# Patient Record
Sex: Male | Born: 1960 | Race: White | Hispanic: No | Marital: Single | Smoking: Former smoker
Health system: Southern US, Community
[De-identification: ages and names within clinical notes are randomized; demographics above are authoritative.]

## PROBLEM LIST (undated history)

## (undated) DIAGNOSIS — I82409 Acute embolism and thrombosis of unspecified deep veins of unspecified lower extremity: Secondary | ICD-10-CM

## (undated) DIAGNOSIS — E785 Hyperlipidemia, unspecified: Secondary | ICD-10-CM

## (undated) DIAGNOSIS — R011 Cardiac murmur, unspecified: Secondary | ICD-10-CM

## (undated) DIAGNOSIS — IMO0002 Reserved for concepts with insufficient information to code with codable children: Secondary | ICD-10-CM

## (undated) DIAGNOSIS — R202 Paresthesia of skin: Secondary | ICD-10-CM

## (undated) DIAGNOSIS — I10 Essential (primary) hypertension: Secondary | ICD-10-CM

## (undated) DIAGNOSIS — R319 Hematuria, unspecified: Secondary | ICD-10-CM

## (undated) DIAGNOSIS — R200 Anesthesia of skin: Secondary | ICD-10-CM

## (undated) DIAGNOSIS — C4492 Squamous cell carcinoma of skin, unspecified: Secondary | ICD-10-CM

## (undated) HISTORY — DX: Squamous cell carcinoma of skin, unspecified: C44.92

## (undated) HISTORY — DX: Reserved for concepts with insufficient information to code with codable children: IMO0002

## (undated) HISTORY — DX: Hematuria, unspecified: R31.9

## (undated) HISTORY — DX: Essential (primary) hypertension: I10

## (undated) HISTORY — DX: Cardiac murmur, unspecified: R01.1

## (undated) HISTORY — DX: Paresthesia of skin: R20.0

## (undated) HISTORY — PX: MOHS SURGERY: SHX181

## (undated) HISTORY — DX: Acute embolism and thrombosis of unspecified deep veins of unspecified lower extremity: I82.409

## (undated) HISTORY — DX: Hyperlipidemia, unspecified: E78.5

## (undated) HISTORY — DX: Paresthesia of skin: R20.2

---
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
## 2006-05-24 ENCOUNTER — Ambulatory Visit: Payer: Self-pay | Admitting: Internal Medicine

## 2006-06-01 ENCOUNTER — Ambulatory Visit: Payer: Self-pay

## 2006-06-01 ENCOUNTER — Encounter: Payer: Self-pay | Admitting: Internal Medicine

## 2006-06-01 ENCOUNTER — Encounter: Payer: Self-pay | Admitting: Cardiology

## 2006-11-17 ENCOUNTER — Encounter: Payer: Self-pay | Admitting: Internal Medicine

## 2006-11-17 DIAGNOSIS — IMO0002 Reserved for concepts with insufficient information to code with codable children: Secondary | ICD-10-CM | POA: Insufficient documentation

## 2006-11-18 ENCOUNTER — Ambulatory Visit: Payer: Self-pay | Admitting: Internal Medicine

## 2006-11-18 DIAGNOSIS — I359 Nonrheumatic aortic valve disorder, unspecified: Secondary | ICD-10-CM | POA: Insufficient documentation

## 2006-11-18 DIAGNOSIS — E785 Hyperlipidemia, unspecified: Secondary | ICD-10-CM | POA: Insufficient documentation

## 2006-11-18 DIAGNOSIS — I10 Essential (primary) hypertension: Secondary | ICD-10-CM | POA: Insufficient documentation

## 2006-11-18 DIAGNOSIS — J309 Allergic rhinitis, unspecified: Secondary | ICD-10-CM | POA: Insufficient documentation

## 2006-11-19 LAB — CONVERTED CEMR LAB
BUN: 11 mg/dL (ref 6–23)
CO2: 29 meq/L (ref 19–32)
Calcium: 9 mg/dL (ref 8.4–10.5)
Chloride: 105 meq/L (ref 96–112)
Cholesterol: 212 mg/dL (ref 0–200)
Creatinine, Ser: 0.8 mg/dL (ref 0.4–1.5)
Direct LDL: 139.9 mg/dL
GFR calc Af Amer: 134 mL/min
GFR calc non Af Amer: 111 mL/min
Glucose, Bld: 89 mg/dL (ref 70–99)
HDL: 35.5 mg/dL — ABNORMAL LOW (ref >39.0)
Potassium: 4.3 meq/L (ref 3.5–5.1)
Sodium: 139 meq/L (ref 135–145)
Total CHOL/HDL Ratio: 6
Triglycerides: 226 mg/dL (ref 0–149)
VLDL: 45 mg/dL — ABNORMAL HIGH (ref 0–40)

## 2007-03-02 ENCOUNTER — Telehealth: Payer: Self-pay | Admitting: Internal Medicine

## 2007-05-16 ENCOUNTER — Encounter: Payer: Self-pay | Admitting: Internal Medicine

## 2007-08-23 ENCOUNTER — Ambulatory Visit: Payer: Self-pay | Admitting: Internal Medicine

## 2007-08-23 DIAGNOSIS — G4730 Sleep apnea, unspecified: Secondary | ICD-10-CM

## 2007-08-23 DIAGNOSIS — G4733 Obstructive sleep apnea (adult) (pediatric): Secondary | ICD-10-CM | POA: Insufficient documentation

## 2007-08-25 ENCOUNTER — Telehealth: Payer: Self-pay | Admitting: Internal Medicine

## 2007-08-25 LAB — CONVERTED CEMR LAB
ALT: 40 units/L (ref 0–53)
AST: 24 units/L (ref 0–37)
Albumin: 3.3 g/dL — ABNORMAL LOW (ref 3.5–5.2)
Alkaline Phosphatase: 65 units/L (ref 39–117)
BUN: 12 mg/dL (ref 6–23)
Basophils Absolute: 0 10*3/uL (ref 0.0–0.1)
Basophils Relative: 0.7 % (ref 0.0–1.0)
Bilirubin, Direct: 0.1 mg/dL (ref 0.0–0.3)
CO2: 31 meq/L (ref 19–32)
Calcium: 9.4 mg/dL (ref 8.4–10.5)
Chloride: 105 meq/L (ref 96–112)
Cholesterol: 230 mg/dL (ref 0–200)
Creatinine, Ser: 0.9 mg/dL (ref 0.4–1.5)
Direct LDL: 171.9 mg/dL
Eosinophils Absolute: 0.3 10*3/uL (ref 0.0–0.7)
Eosinophils Relative: 3.6 % (ref 0.0–5.0)
GFR calc Af Amer: 116 mL/min
GFR calc non Af Amer: 96 mL/min
Glucose, Bld: 95 mg/dL (ref 70–99)
HCT: 42.3 % (ref 39.0–52.0)
HDL: 37.5 mg/dL — ABNORMAL LOW (ref >39.0)
Hemoglobin: 14.3 g/dL (ref 13.0–17.0)
Lymphocytes Relative: 24.6 % (ref 12.0–46.0)
MCHC: 33.7 g/dL (ref 30.0–36.0)
MCV: 98.1 fL (ref 78.0–100.0)
Monocytes Absolute: 0.6 10*3/uL (ref 0.1–1.0)
Monocytes Relative: 8.2 % (ref 3.0–12.0)
Neutro Abs: 4.5 10*3/uL (ref 1.4–7.7)
Neutrophils Relative %: 62.9 % (ref 43.0–77.0)
Platelets: 268 10*3/uL (ref 150–400)
Potassium: 4.5 meq/L (ref 3.5–5.1)
RBC: 4.32 M/uL (ref 4.22–5.81)
RDW: 12.5 % (ref 11.5–14.6)
Sodium: 140 meq/L (ref 135–145)
TSH: 1.42 microintl units/mL (ref 0.35–5.50)
Total Bilirubin: 0.6 mg/dL (ref 0.3–1.2)
Total CHOL/HDL Ratio: 6.1
Total Protein: 6.4 g/dL (ref 6.0–8.3)
Triglycerides: 129 mg/dL (ref 0–149)
VLDL: 26 mg/dL (ref 0–40)
WBC: 7.1 10*3/uL (ref 4.5–10.5)

## 2007-09-21 ENCOUNTER — Encounter: Payer: Self-pay | Admitting: Internal Medicine

## 2007-09-21 ENCOUNTER — Ambulatory Visit (HOSPITAL_BASED_OUTPATIENT_CLINIC_OR_DEPARTMENT_OTHER): Admission: RE | Admit: 2007-09-21 | Discharge: 2007-09-21 | Payer: Self-pay | Admitting: Internal Medicine

## 2007-09-22 ENCOUNTER — Ambulatory Visit: Payer: Self-pay | Admitting: Pulmonary Disease

## 2007-09-22 ENCOUNTER — Telehealth (INDEPENDENT_AMBULATORY_CARE_PROVIDER_SITE_OTHER): Payer: Self-pay | Admitting: *Deleted

## 2008-03-21 ENCOUNTER — Telehealth: Payer: Self-pay | Admitting: Internal Medicine

## 2008-03-28 ENCOUNTER — Ambulatory Visit: Payer: Self-pay | Admitting: Internal Medicine

## 2008-03-28 DIAGNOSIS — E669 Obesity, unspecified: Secondary | ICD-10-CM

## 2008-03-28 DIAGNOSIS — M109 Gout, unspecified: Secondary | ICD-10-CM | POA: Insufficient documentation

## 2008-11-28 ENCOUNTER — Encounter: Payer: Self-pay | Admitting: Internal Medicine

## 2008-12-17 ENCOUNTER — Ambulatory Visit (HOSPITAL_COMMUNITY): Admission: RE | Admit: 2008-12-17 | Discharge: 2008-12-17 | Payer: Self-pay | Admitting: Internal Medicine

## 2008-12-18 ENCOUNTER — Ambulatory Visit: Payer: Self-pay | Admitting: Internal Medicine

## 2008-12-18 LAB — CONVERTED CEMR LAB
ALT: 36 units/L (ref 0–53)
AST: 21 units/L (ref 0–37)
Albumin: 3.4 g/dL — ABNORMAL LOW (ref 3.5–5.2)
Alkaline Phosphatase: 65 units/L (ref 39–117)
BUN: 14 mg/dL (ref 6–23)
Basophils Absolute: 0 10*3/uL (ref 0.0–0.1)
Basophils Relative: 0.2 % (ref 0.0–3.0)
Bilirubin Urine: NEGATIVE
Bilirubin, Direct: 0.1 mg/dL (ref 0.0–0.3)
CO2: 27 meq/L (ref 19–32)
Calcium: 9.1 mg/dL (ref 8.4–10.5)
Chloride: 108 meq/L (ref 96–112)
Cholesterol: 214 mg/dL — ABNORMAL HIGH (ref 0–200)
Creatinine, Ser: 0.8 mg/dL (ref 0.4–1.5)
Direct LDL: 155.1 mg/dL
Eosinophils Absolute: 0.2 10*3/uL (ref 0.0–0.7)
Eosinophils Relative: 2.9 % (ref 0.0–5.0)
GFR calc non Af Amer: 109.49 mL/min (ref >60)
Glucose, Bld: 97 mg/dL (ref 70–99)
Glucose, Urine, Semiquant: NEGATIVE
HCT: 42.5 % (ref 39.0–52.0)
HDL: 38.4 mg/dL — ABNORMAL LOW (ref >39.00)
Hemoglobin: 15.2 g/dL (ref 13.0–17.0)
Hgb A1c MFr Bld: 5.6 % (ref 4.6–6.5)
Ketones, urine, test strip: NEGATIVE
Lymphocytes Relative: 17.8 % (ref 12.0–46.0)
Lymphs Abs: 1.4 10*3/uL (ref 0.7–4.0)
MCHC: 35.7 g/dL (ref 30.0–36.0)
MCV: 94.8 fL (ref 78.0–100.0)
Monocytes Absolute: 0.7 10*3/uL (ref 0.1–1.0)
Monocytes Relative: 8.6 % (ref 3.0–12.0)
Neutro Abs: 5.6 10*3/uL (ref 1.4–7.7)
Neutrophils Relative %: 70.5 % (ref 43.0–77.0)
Nitrite: NEGATIVE
Platelets: 250 10*3/uL (ref 150.0–400.0)
Potassium: 4.1 meq/L (ref 3.5–5.1)
RBC: 4.48 M/uL (ref 4.22–5.81)
RDW: 12.5 % (ref 11.5–14.6)
Sodium: 140 meq/L (ref 135–145)
Specific Gravity, Urine: >=1.03
TSH: 0.98 microintl units/mL (ref 0.35–5.50)
Total Bilirubin: 0.7 mg/dL (ref 0.3–1.2)
Total CHOL/HDL Ratio: 6
Total Protein: 6.5 g/dL (ref 6.0–8.3)
Triglycerides: 113 mg/dL (ref 0.0–149.0)
Urobilinogen, UA: 0.2
VLDL: 22.6 mg/dL (ref 0.0–40.0)
WBC Urine, dipstick: NEGATIVE
WBC: 7.9 10*3/uL (ref 4.5–10.5)
pH: 5.5

## 2008-12-19 ENCOUNTER — Telehealth: Payer: Self-pay | Admitting: Internal Medicine

## 2008-12-19 ENCOUNTER — Encounter: Payer: Self-pay | Admitting: Internal Medicine

## 2008-12-26 ENCOUNTER — Ambulatory Visit: Payer: Self-pay | Admitting: Internal Medicine

## 2008-12-27 ENCOUNTER — Ambulatory Visit: Payer: Self-pay

## 2008-12-27 ENCOUNTER — Encounter: Payer: Self-pay | Admitting: Internal Medicine

## 2009-01-21 ENCOUNTER — Telehealth: Payer: Self-pay | Admitting: Internal Medicine

## 2009-01-22 ENCOUNTER — Encounter: Payer: Self-pay | Admitting: Internal Medicine

## 2009-02-05 ENCOUNTER — Telehealth (INDEPENDENT_AMBULATORY_CARE_PROVIDER_SITE_OTHER): Payer: Self-pay | Admitting: *Deleted

## 2009-03-20 HISTORY — PX: LAPAROSCOPIC GASTRIC BANDING: SHX1100

## 2009-03-25 ENCOUNTER — Telehealth: Payer: Self-pay | Admitting: Internal Medicine

## 2009-05-24 ENCOUNTER — Telehealth: Payer: Self-pay | Admitting: Internal Medicine

## 2010-01-13 ENCOUNTER — Ambulatory Visit: Payer: Self-pay | Admitting: Internal Medicine

## 2010-01-13 DIAGNOSIS — F17200 Nicotine dependence, unspecified, uncomplicated: Secondary | ICD-10-CM | POA: Insufficient documentation

## 2010-01-16 LAB — CONVERTED CEMR LAB
ALT: 34 units/L (ref 0–53)
AST: 32 units/L (ref 0–37)
Albumin: 3.3 g/dL — ABNORMAL LOW (ref 3.5–5.2)
Alkaline Phosphatase: 66 units/L (ref 39–117)
BUN: 9 mg/dL (ref 6–23)
Basophils Absolute: 0 10*3/uL (ref 0.0–0.1)
Basophils Relative: 0.4 % (ref 0.0–3.0)
Bilirubin, Direct: 0.2 mg/dL (ref 0.0–0.3)
CO2: 28 meq/L (ref 19–32)
Calcium: 9.1 mg/dL (ref 8.4–10.5)
Chloride: 107 meq/L (ref 96–112)
Cholesterol: 199 mg/dL (ref 0–200)
Creatinine, Ser: 0.6 mg/dL (ref 0.4–1.5)
Eosinophils Absolute: 0.2 10*3/uL (ref 0.0–0.7)
Eosinophils Relative: 3 % (ref 0.0–5.0)
GFR calc non Af Amer: 157.99 mL/min (ref >60)
Glucose, Bld: 77 mg/dL (ref 70–99)
HCT: 42.1 % (ref 39.0–52.0)
HDL: 53.3 mg/dL (ref >39.00)
Hemoglobin: 14.3 g/dL (ref 13.0–17.0)
LDL Cholesterol: 129 mg/dL — ABNORMAL HIGH (ref 0–99)
Lymphocytes Relative: 23.8 % (ref 12.0–46.0)
Lymphs Abs: 1.9 10*3/uL (ref 0.7–4.0)
MCHC: 34 g/dL (ref 30.0–36.0)
MCV: 100.5 fL — ABNORMAL HIGH (ref 78.0–100.0)
Monocytes Absolute: 0.7 10*3/uL (ref 0.1–1.0)
Monocytes Relative: 8.6 % (ref 3.0–12.0)
Neutro Abs: 5.1 10*3/uL (ref 1.4–7.7)
Neutrophils Relative %: 64.2 % (ref 43.0–77.0)
Platelets: 264 10*3/uL (ref 150.0–400.0)
Potassium: 4.9 meq/L (ref 3.5–5.1)
RBC: 4.19 M/uL — ABNORMAL LOW (ref 4.22–5.81)
RDW: 14.1 % (ref 11.5–14.6)
Sodium: 142 meq/L (ref 135–145)
TSH: 1.05 microintl units/mL (ref 0.35–5.50)
Total Bilirubin: 0.9 mg/dL (ref 0.3–1.2)
Total CHOL/HDL Ratio: 4
Total Protein: 5.9 g/dL — ABNORMAL LOW (ref 6.0–8.3)
Triglycerides: 85 mg/dL (ref 0.0–149.0)
VLDL: 17 mg/dL (ref 0.0–40.0)
WBC: 7.9 10*3/uL (ref 4.5–10.5)

## 2010-05-11 ENCOUNTER — Encounter: Payer: Self-pay | Admitting: Internal Medicine

## 2010-05-20 NOTE — Assessment & Plan Note (Signed)
Summary: 6 month fu bp and aortic stenosis/et rsc bmp/njr   Vital Signs:  Patient Profile:   50 Years Old Male Height:     72 inches (182.88 cm) Temp:     98.2 degrees F oral Pulse rate:   86 / minute Pulse rhythm:   regular Resp:     16 per minute BP sitting:   168 / 90  Pt. in pain?   no                Chief Complaint:  BP check.  History of Present Illness: here for f/u htn---no sxs on meds Follow-Up Visit      This is a 50 year old man who presents for Follow-up visit.  The patient denies chest pain, palpitations, dizziness, syncope, low blood sugar symptoms, high blood sugar symptoms, edema, SOB, DOE, PND, and orthopnea.  Since the last visit the patient notes no new problems or concerns.  The patient reports taking meds as prescribed and not monitoring BP.  When questioned about possible medication side effects, the patient notes none.    Current Allergies: No known allergies   Past Medical History:    Reviewed history from 11/17/2006 and no changes required:       CARCINOMA, SKIN, SQUAMOUS CELL, FACE (ICD-173.3)       DEGENERATIVE DISC DISEASE (ICD-722.6)       Hyperlipidemia       Hypertension  Past Surgical History:    Reviewed history from 11/17/2006 and no changes required:       Hernia repair x2   Family History:    Reviewed history and no changes required:       mother-breast ca       father htn  Social History:    Reviewed history and no changes required:       Occupation: [employer]---works 20 days monthly [place]       Divorced       Current Smoker       Alcohol use-yes   Risk Factors:  Tobacco use:  current Alcohol use:  yes   Review of Systems       no othe rcomplaints in a complete ROS   Physical Exam  General:     Well-developed,well-nourished,in no acute distress; alert,appropriate and cooperative throughout examination Eyes:     No corneal or conjunctival inflammation noted. EOMI. Perrla. Funduscopic exam benign, without  hemorrhages, exudates or papilledema. Vision grossly normal. Mouth:     Oral mucosa and oropharynx without lesions or exudates.  Teeth in good repair. Neck:     No deformities, masses, or tenderness noted. Chest Wall:     No deformities, masses, tenderness or gynecomastia noted. Lungs:     Normal respiratory effort, chest expands symmetrically. Lungs are clear to auscultation, no crackles or wheezes. Heart:     Normal rate and regular rhythm. S1 and S2 normal without gallop, murmur, click, rub or other extra sounds. 1-2/6 murmur sem Abdomen:     Bowel sounds positive,abdomen soft and non-tender without masses, organomegaly or hernias noted. Neurologic:     No cranial nerve deficits noted. Station and gait are normal. DTRs are symmetrical throughout. Sensory, motor and coordinative functions appear intact.    Impression & Recommendations:  Problem # 1:  HYPERTENSION (ICD-401.9)  His updated medication list for this problem includes:    Lisinopril-hydrochlorothiazide 20-12.5 Mg Tabs (Lisinopril-hydrochlorothiazide) ..... Once daily  Bp not adequately controlled---add hctz--see orders BMET  Orders: Venipuncture (57846)  TLB-BMP (Basic Metabolic Panel-BMET) (80048-METABOL)   Problem # 2:  HYPERLIPIDEMIA (ICD-272.4) check today Orders: Venipuncture (44010) TLB-Lipid Panel (80061-LIPID) may need treatment   Problem # 3:  AORTIC STENOSIS (ICD-424.1) reviewed echo, no further eval at this time murmur sounds better at this visit  Problem # 4:  ALLERGIC  RHINITIS (ICD-477.9)  His updated medication list for this problem includes:    Alavert 10 Mg Tbdp (Loratadine) ..... One by mouth daily  Orders: Venipuncture (27253)   Complete Medication List: 1)  Lisinopril-hydrochlorothiazide 20-12.5 Mg Tabs (Lisinopril-hydrochlorothiazide) .... Once daily 2)  Alavert 10 Mg Tbdp (Loratadine) .... One by mouth daily   Patient Instructions: 1)  see me 6  months    Prescriptions: ALAVERT 10 MG  TBDP (LORATADINE) one by mouth daily  #100 x 3   Entered and Authorized by:   Birdie Sons MD   Signed by:   Birdie Sons MD on 11/18/2006   Method used:   Electronically sent to ...       [facility] [address]       [address]       [place], [place]  [address]       Ph: [phone]       Fax: [phone]   RxID:   [id number] LISINOPRIL-HYDROCHLOROTHIAZIDE 20-12.5 MG TABS (LISINOPRIL-HYDROCHLOROTHIAZIDE) once daily  #100 x 3   Entered and Authorized by:   Birdie Sons MD   Signed by:   Birdie Sons MD on 11/18/2006   Method used:   Electronically sent to ...       [facility] [address]       [address]       [place], [place]  [address]       Ph: [phone]       Fax: [phone]   RxID:   [id number]

## 2010-05-20 NOTE — Assessment & Plan Note (Signed)
Summary: cpx--will fast//ccm   Vital Signs:  Patient profile:   50 year old male Height:      70 inches Weight:      229 pounds BMI:     32.98 Temp:     97.7 degrees F oral Pulse rate:   68 / minute Pulse rhythm:   regular Resp:     16 per minute BP sitting:   122 / 80  Vitals Entered By: Lynann Beaver CMA (January 13, 2010 8:22 AM) CC: cpx Is Patient Diabetic? No Pain Assessment Patient in pain? no        CC:  cpx.  History of Present Illness: cpx  hx of cystic acne---seems to be worsening (followed by dermatology)  Current Problems (verified): 1)  Obesity  (ICD-278.00) 2)  Gout  (ICD-274.9) 3)  Sleep Apnea  (ICD-780.57) 4)  Preventive Health Care  (ICD-V70.0) 5)  Allergic Rhinitis  (ICD-477.9) 6)  Hypertension  (ICD-401.9) 7)  Hyperlipidemia  (ICD-272.4) 8)  Aortic Stenosis  (ICD-424.1) 9)  Degenerative Disc Disease  (ICD-722.6)  Allergies: No Known Drug Allergies  Past History:  Past Medical History: Last updated: 03/28/2008 CARCINOMA, SKIN, SQUAMOUS CELL, FACE (ICD-173.3) DEGENERATIVE DISC DISEASE (ICD-722.6) Hyperlipidemia Hypertension Gout  Family History: Last updated: 11/18/2006 mother-breast ca father htn  Social History: Last updated: 11/18/2006 Occupation: [employer]---works 20 days monthly [place] Divorced Current Smoker Alcohol use-yes  Risk Factors: Smoking Status: current (11/18/2006) Packs/Day: 1-2 ppd (11/17/2006)  Past Surgical History: Hernia repair x2 Lap Band   Impression & Recommendations:  Problem # 1:  PREVENTIVE HEALTH CARE (ICD-V70.0)  health maint UTD congratulatedon weight loss---encouraged to continue  Orders: UA Dipstick w/o Micro (automated)  (81003) Venipuncture (16109) TLB-Lipid Panel (80061-LIPID) TLB-BMP (Basic Metabolic Panel-BMET) (80048-METABOL) TLB-CBC Platelet - w/Differential (85025-CBCD) TLB-Hepatic/Liver Function Pnl (80076-HEPATIC) TLB-TSH (Thyroid Stimulating Hormone)  (84443-TSH)  Problem # 2:  TOBACCO USE (ICD-305.1)  Encouraged smoking cessation and discussed different methods for smoking cessation.   Problem # 3:  HYPERTENSION (ICD-401.9) as he loses weight I suspect he'll get of BP meds.  His updated medication list for this problem includes:    Lisinopril 20 Mg Tabs (Lisinopril) .Marland Kitchen... 1/2 by mouth daily  BP today: 122/80 Prior BP: 130/86 (12/26/2008)  Labs Reviewed: K+: 4.1 (12/18/2008) Creat: : 0.8 (12/18/2008)   Chol: 214 (12/18/2008)   HDL: 38.40 (12/18/2008)   LDL: DEL (08/23/2007)   TG: 113.0 (12/18/2008)  Problem # 4:  HYPERLIPIDEMIA (ICD-272.4) check labs today---i suspect will be much better with weight loss Labs Reviewed: SGOT: 21 (12/18/2008)   SGPT: 36 (12/18/2008)   HDL:38.40 (12/18/2008), 37.5 (08/23/2007)  LDL:DEL (08/23/2007), DEL (11/18/2006)  Chol:214 (12/18/2008), 230 (08/23/2007)  Trig:113.0 (12/18/2008), 129 (08/23/2007)  Problem # 5:  OBESITY (ICD-278.00) much imporved  Problem # 6:  SLEEP APNEA (ICD-780.57) i suspect beeter or "cured" with weight loss.   Complete Medication List: 1)  Lisinopril 20 Mg Tabs (Lisinopril) .... 1/2 by mouth daily 2)  Loratidine Otc 10mg   .... Take 1 tablet by mouth once a day Physical Exam General Appearance: well developed, well nourished, no acute distress Eyes: conjunctiva and lids normal, PERRL, EOMI,  Ears, Nose, Mouth, Throat: TM clear, nares clear, oral exam WNL Neck: supple, no lymphadenopathy, no thyromegaly, no JVD Respiratory: clear to auscultation and percussion, respiratory effort normal Cardiovascular: regular rate and rhythm, S1-S2, no murmur, rub or gallop, no bruits, peripheral pulses normal and symmetric, no cyanosis, clubbing, edema or varicosities Chest: no scars, masses, tenderness; no asymmetry, skin  changes,  Gastrointestinal: soft, non-tender; no hepatosplenomegaly, masses; active bowel sounds all quadrants,  no masses, tenderness, hemorrhoids ---palpable Lap  Band epigastric area Genitourinary: no hernia,  Lymphatic: no cervical, axillary or inguinal adenopathy Musculoskeletal: gait normal, muscle tone and strength WNL, no joint swelling, effusions, discoloration, crepitus  Skin: clear, good turgor, color WNL, no rashes, lesions, or ulcerations Neurologic: normal mental status, normal reflexes, normal strength, sensation, and motion Psychiatric: alert; oriented to person, place and time Other Exam:     Appended Document: Orders Update    Clinical Lists Changes  Orders: Added new Service order of Specimen Handling (06301) - Signed Observations: Added new observation of COMMENTS: .sign (01/13/2010 8:54) Added new observation of PH URINE: 5.5  (01/13/2010 8:54) Added new observation of SPEC GR URIN: 1.020  (01/13/2010 8:54) Added new observation of APPEARANCE U: Clear  (01/13/2010 8:54) Added new observation of UA COLOR: yellow  (01/13/2010 8:54) Added new observation of WBC DIPSTK U: negative  (01/13/2010 8:54) Added new observation of NITRITE URN: negative  (01/13/2010 8:54) Added new observation of UROBILINOGEN: 0.2  (01/13/2010 8:54) Added new observation of PROTEIN, URN: 2+  (01/13/2010 8:54) Added new observation of BLOOD UR DIP: 3+  (01/13/2010 8:54) Added new observation of KETONES URN: negative  (01/13/2010 8:54) Added new observation of BILIRUBIN UR: negative  (01/13/2010 8:54) Added new observation of GLUCOSE, URN: negative  (01/13/2010 8:54)      Laboratory Results   Urine Tests    Routine Urinalysis   Color: yellow Appearance: Clear Glucose: negative   (Normal Range: Negative) Bilirubin: negative   (Normal Range: Negative) Ketone: negative   (Normal Range: Negative) Spec. Gravity: 1.020   (Normal Range: 1.003-1.035) Blood: 3+   (Normal Range: Negative) pH: 5.5   (Normal Range: 5.0-8.0) Protein: 2+   (Normal Range: Negative) Urobilinogen: 0.2   (Normal Range: 0-1) Nitrite: negative   (Normal Range:  Negative) Leukocyte Esterace: negative   (Normal Range: Negative)    Comments: .sign

## 2010-05-20 NOTE — Progress Notes (Signed)
Summary: rf-lisinopril-HCTZ  Phone Note Call from Patient Call back at [phone]   Caller: Patient Call For: dr Ziara Thelander Reason for Call: Refill Medication Summary of Call: rf bp [facility]s [facility].  call [phone]. id- [id number]. call pt when done.    pt other phone is [phone] 05/29/8597. Initial call taken by: Warnell Forester,  March 02, 2007 2:12 PM  Follow-up for Phone Call        Rx printed.  Will call pt back when rx signed...................................................................Marland KitchenGladis Riffle, RN  March 02, 2007 5:29 PM   Additional Follow-up for Phone Call Additional follow up Details #1::        Rx at front desk for pick up for pt to mail to [facility].Patient notified-message left on home machine. Additional Follow-up by: Gladis Riffle, RN,  March 04, 2007 1:53 PM      Prescriptions: LISINOPRIL-HYDROCHLOROTHIAZIDE 20-12.5 MG TABS (LISINOPRIL-HYDROCHLOROTHIAZIDE) once daily  #100 x 3   Entered by:   Gladis Riffle, RN   Authorized by:   Birdie Sons MD   Signed by:   Gladis Riffle, RN on 03/02/2007   Method used:   Print then Give to Patient   RxID:   [id number]     Appended Document: rf-lisinopril-HCTZ Patient called back.  Is out of [place] and will return Thurs. and will need med and requests that RX be called in this time.  Done.

## 2010-05-20 NOTE — Progress Notes (Signed)
Summary: Pt leaving for [place] Sat, requesting sleep machine ASAP  Phone Note Call from Patient Call back at Home Phone [phone]   Caller: vm triage Call For: swords Complaint: Chest Pain Summary of Call: went for a sleep study and they said he needs to get a machine.  He would like to expedite this as he is leaving the country for 4 weeks  Initial call taken by: Roselle Locus,  September 22, 2007 12:31 PM  Follow-up for Phone Call        PT CAME TO THE OFFICE ABOUT THE C PAP MACHINE  WOULD LIKE TO KNOW IF THE MACHINE CAN BE SHIPPED TO [place] OR [place] OR IF HE HAS TO COME BACK TO [place] TO PICK UP HE WILL CALL AND GIVE A GOOD 800# AS HE IS LEAVING IN THE AM FOR [place]  Roselle Locus  September 22, 2007 1:59 PM  Additional Follow-up for Phone Call Additional follow up Details #1::        Pt called with updated contact number in [place] entered new number into registration information, work number.  Pt is leaving tomorrow, wondering if he can still get his machine today before leaving.  Pt will be gone 4 weeks  Has order for machine been done? He would like to take machine with him if possible Additional Follow-up by: Sid Falcon LPN,  September 23, 979 12:08 PM    Additional Follow-up for Phone Call Additional follow up Details #2::    Per verbal from Dr Cato Mulligan after reviewing the sleep study, CPAP 10 cm H2O.  Please see how quickly Patsy Lager can get this dome for pt leaving out of town 4 weeks tomorow. Follow-up by: Sid Falcon LPN,  September 22, 1912 1:23 PM  Additional Follow-up for Phone Call Additional follow up Details #3:: Details for Additional Follow-up Action Taken: Faxed order, sleep study, insurance card  to [facility] [phone] Spoke with [facility]-patient can go by their office before 4:00pm today and get set up the CPAP machine Patient aware-given address and phone number  Additional Follow-up by: Florentina Addison,  September 23, 2007 2:23 PM

## 2010-05-20 NOTE — Letter (Signed)
Summary: sleep study  sleep study   Imported By: Kassie Mends 09/23/2007 16:07:30  _____________________________________________________________________  External Attachment:    Type:   Image     Comment:   sleep study

## 2010-05-20 NOTE — Progress Notes (Signed)
  Faxed patients lab results on 12-19-08 to Dr. Josiah Lobo at fax # [phone]. Rita Ohara

## 2010-05-20 NOTE — Progress Notes (Signed)
Summary: refill lisinopril-hctz  Phone Note Call from Patient Call back at Home Phone [phone]   Caller: pt live Call For: Matthew Savage Summary of Call: lisinopril hctz tab 20 mg - 12.5 [facility]  Initial call taken by: Roselle Locus,  March 21, 2008 8:40 AM  Follow-up for Phone Call        Rx faxed to [facility].Gladis Riffle, RN  March 21, 2008 9:12 AM   Patient notified.  Follow-up by: Gladis Riffle, RN,  March 21, 2008 10:52 AM      Prescriptions: LISINOPRIL-HYDROCHLOROTHIAZIDE 20-12.5 MG TABS (LISINOPRIL-HYDROCHLOROTHIAZIDE) once daily  #100 x 3   Entered by:   Gladis Riffle, RN   Authorized by:   Birdie Sons MD   Signed by:   Gladis Riffle, RN on 03/21/2008   Method used:   Faxed to ...       [facility] [address] ([facility]-order)       [address] [place], [place]  [address]       Ph: [phone]       Fax: [phone]   RxID:   [id number]

## 2010-05-20 NOTE — Assessment & Plan Note (Signed)
Summary: CPX/RCD/PT RESCD FOR A FASTING CPX/CCM   Vital Signs:  Patient Profile:   50 Years Old Male Height:     72 inches (182.88 cm) Weight:      346 pounds Pulse rate:   64 / minute BP sitting:   142 / 90  (left arm)  Vitals Entered By: Gladis Riffle, RN (Aug 23, 2007 9:42 AM)                 Chief Complaint:  cpx and fasting--states lost 20 #--c/o feeling drowsy some AMs. thinks reaction to BP med.  History of Present Illness: cpx    Current Allergies (reviewed today): No known allergies   Past Medical History:    Reviewed history from 11/18/2006 and no changes required:       CARCINOMA, SKIN, SQUAMOUS CELL, FACE (ICD-173.3)       DEGENERATIVE DISC DISEASE (ICD-722.6)       Hyperlipidemia       Hypertension  Past Surgical History:    Reviewed history from 11/17/2006 and no changes required:       Hernia repair x2   Family History:    Reviewed history from 11/18/2006 and no changes required:       mother-breast ca       father htn  Social History:    Reviewed history from 11/18/2006 and no changes required:       Occupation: [employer]---works 20 days monthly [place]       Divorced       Current Smoker       Alcohol use-yes    Review of Systems       no other complaints in a complete ROS feels tired frequently   Physical Exam  General:     Well-developed,well-nourished,in no acute distress; alert,appropriate and cooperative throughout examination    Impression & Recommendations:  Problem # 1:  PREVENTIVE HEALTH CARE (ICD-V70.0) diet, exercise and weight loss he voices understanding Orders: Venipuncture (16109) TLB-Lipid Panel (80061-LIPID) TLB-BMP (Basic Metabolic Panel-BMET) (80048-METABOL) TLB-CBC Platelet - w/Differential (85025-CBCD) TLB-Hepatic/Liver Function Pnl (80076-HEPATIC) TLB-TSH (Thyroid Stimulating Hormone) (84443-TSH) UA Dipstick w/Micro (automated) (81001)   Problem # 2:  HYPERTENSION (ICD-401.9) much better  controlled I suspect will improve with weight loss---this should be primary goal. His updated medication list for this problem includes:    Lisinopril-hydrochlorothiazide 20-12.5 Mg Tabs (Lisinopril-hydrochlorothiazide) ..... Once daily  BP today: 142/90 Prior BP: 168/90 (11/18/2006)  Labs Reviewed: Creat: 0.8 (11/18/2006) Chol: 212 (11/18/2006)   HDL: 35.5 (11/18/2006)   LDL: DEL (11/18/2006)   TG: 226 (11/18/2006)   Problem # 3:  HYPERLIPIDEMIA (ICD-272.4) check labs today Labs Reviewed: Chol: 212 (11/18/2006)   HDL: 35.5 (11/18/2006)   LDL: DEL (11/18/2006)   TG: 226 (11/18/2006)   Problem # 4:  AORTIC STENOSIS (ICD-424.1) very mild by echo---no eval necessary  Problem # 5:  SLEEP APNEA (ICD-780.57) needs further evaluation likely weight related Orders: Sleep Disorder Referral (Sleep Disorder)   Complete Medication List: 1)  Lisinopril-hydrochlorothiazide 20-12.5 Mg Tabs (Lisinopril-hydrochlorothiazide) .... Once daily 2)  Alavert 10 Mg Tbdp (Loratadine) .... One by mouth daily    ]Physical Exam General Appearance: well developed, well nourished, no acute distress Eyes: conjunctiva and lids normal, PERRL, EOMI, fundi WNL Ears, Nose, Mouth, Throat: TM clear, nares clear, oral exam WNL Neck: supple, no lymphadenopathy, no thyromegaly, no JVD Respiratory: clear to auscultation and percussion, respiratory effort normal Cardiovascular: regular rate and rhythm, S1-S2, no murmur, rub or gallop, no  bruits, peripheral pulses normal and symmetric, no cyanosis, clubbing, edema or varicosities Chest: no scars, masses, tenderness; no asymmetry, skin changes, nipple discharge, no gynecomastia   Gastrointestinal: soft, non-tender; no hepatosplenomegaly, masses; active bowel sounds all quadrants, ; no masses, tenderness, hemorrhoids  Genitourinary: no hernia, testicular mass, or prostate enlargement Lymphatic: no cervical, axillary or inguinal adenopathy Musculoskeletal: gait normal,  muscle tone and strength WNL, no joint swelling, effusions, discoloration, crepitus  Skin: clear, good turgor, color WNL, no rashes, lesions, or ulcerations Neurologic: normal mental status, normal reflexes, normal strength, sensation, and motion Psychiatric: alert; oriented to person, place and time Other Exam:   Appended Document: Orders Update    Clinical Lists Changes  Observations: Added new observation of COMMENTS: Wynona Canes, CMA  Aug 23, 2007 1:42 PM  (08/23/2007 13:41) Added new observation of PH URINE: 7.0  (08/23/2007 13:41) Added new observation of SPEC GR URIN: 1.020  (08/23/2007 13:41) Added new observation of APPEARANCE U: Clear  (08/23/2007 13:41) Added new observation of WBC DIPSTK U: negative  (08/23/2007 13:41) Added new observation of NITRITE URN: negative  (08/23/2007 13:41) Added new observation of UROBILINOGEN: 0.2  (08/23/2007 13:41) Added new observation of PROTEIN, URN: 3+  (08/23/2007 13:41) Added new observation of BLOOD UR DIP: 3+  (08/23/2007 13:41) Added new observation of KETONES URN: negative  (08/23/2007 13:41) Added new observation of BILIRUBIN UR: negative  (08/23/2007 13:41) Added new observation of GLUCOSE, URN: negative  (08/23/2007 13:41)       Laboratory Results   Urine Tests   Date/Time Reported: Aug 23, 2007 1:42 PM   Routine Urinalysis   Appearance: Clear Glucose: negative   (Normal Range: Negative) Bilirubin: negative   (Normal Range: Negative) Ketone: negative   (Normal Range: Negative) Spec. Gravity: 1.020   (Normal Range: 1.003-1.035) Blood: 3+   (Normal Range: Negative) pH: 7.0   (Normal Range: 5.0-8.0) Protein: 3+   (Normal Range: Negative) Urobilinogen: 0.2   (Normal Range: 0-1) Nitrite: negative   (Normal Range: Negative) Leukocyte Esterace: negative   (Normal Range: Negative)    Comments: Wynona Canes, CMA  Aug 23, 2007 1:42 PM     Appended Document: CPX/RCD/PT RESCD FOR A FASTING CPX/CCM call  patient. has blood in urine...Marland Kitchenneeds scan to determine why. Yolanda, will you schedule CT abdomen and Pelvis----hematuria---no pain  Appended Document: CPX/RCD/PT RESCD FOR A FASTING CPX/CCM Appt 05/08 @ 2:00 Patient was not notified about results. 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 He wants to know why he needs a CT Scan. Please call.

## 2010-05-20 NOTE — Progress Notes (Signed)
  Recieved RElease Of Information th rough FAx will forward to [facility]  [facility]  February 05, 2009 8:52 AM

## 2010-05-20 NOTE — Progress Notes (Signed)
Summary: Cancel CT Scan  Phone Note Call from Patient Call back at Home Phone [phone]   Caller: Patient Call For: Birdie Sons MD Summary of Call: Patient refused CT scan.  Initial call taken by: Florentina Addison,  Aug 25, 2007 11:11 AM  Follow-up for Phone Call        pt understands risks including death Follow-up by: Birdie Sons MD,  Aug 25, 2007 12:21 PM

## 2010-05-20 NOTE — Miscellaneous (Signed)
Summary: Orders Update--cxr, upper gi preop  Clinical Lists Changes  Orders: Added new Test order of T-2 View CXR (71020TC) - Signed Added new Referral order of Radiology Referral (Radiology) - Signed

## 2010-05-20 NOTE — Progress Notes (Signed)
Summary: BP questions?  Phone Note Call from Patient   Caller: Patient Call For: Birdie Sons MD Summary of Call: Pt calls stating he has lost 80 lbs and has stopped drinking alcohol, and the last few times he has taken his BP, it is running 104/62, 114/72 and lower.  Has episodes of dizzness occ lately.  Would like to decrease his Lisinopril dose.  Is taking 20 mg. two times a day. [id number] Initial call taken by: Lynann Beaver CMA,  May 24, 2009 8:57 AM  Follow-up for Phone Call        per dr Lovell Sheehan hold lisinopril until 05/27/09 then lisinopril 20mg  once daily.  If BP continues low, stop lisinopril and continue to monitor. Follow-up by: Gladis Riffle, RN,  May 24, 2009 2:56 PM    New/Updated Medications: LISINOPRIL 20 MG TABS (LISINOPRIL) Hold until 05/27/09 then Take 1 tablet by mouth once a day.  If continues hypotension, dc.

## 2010-05-20 NOTE — Progress Notes (Signed)
Summary: letter of medical necessity  Phone Note From Other Clinic Call back at have form with address   Caller: Dr Donella Stade Call For: Ollie Esty Summary of Call: Needs letter of medical necessity for bariatric surgery  sent to:  Dr Donella Stade                                                                                                       c/o [facility]                                                                                                       [address] [facility];                                                                                                         8th floor [employer] [address] [place].                                                                                                       [place], [place] [address] Initial call taken by: Gladis Riffle, RN,  January 21, 2009 4:11 PM  Follow-up for Phone Call        done and mailed Follow-up by: Willy Eddy, LPN,  January 22, 2009 10:57 AM

## 2010-05-20 NOTE — Assessment & Plan Note (Signed)
Summary: CPX/ TALK ABOUT LAP BAND//SLM   Vital Signs:  Patient profile:   50 year old male Height:      71 inches Weight:      341 pounds BMI:     47.73 Pulse rate:   62 / minute Resp:     12 per minute BP sitting:   130 / 86  (left arm) Cuff size:   large  Vitals Entered By: Gladis Riffle, RN (December 26, 2008 10:54 AM)  History of Present Illness: cpx  Current Problems (verified): 1)  Obesity  (ICD-278.00) 2)  Gout  (ICD-274.9) 3)  Sleep Apnea  (ICD-780.57) 4)  Preventive Health Care  (ICD-V70.0) 5)  Allergic Rhinitis  (ICD-477.9) 6)  Hypertension  (ICD-401.9) 7)  Hyperlipidemia  (ICD-272.4) 8)  Aortic Stenosis  (ICD-424.1) 9)  Degenerative Disc Disease  (ICD-722.6)  Current Medications (verified): 1)  Lisinopril 40 Mg Tabs (Lisinopril) .Marland Kitchen.. 1 By Mouth Once Daily 2)  Loratidine Otc 10mg  .... Take 1 Tablet By Mouth Once A Day  Allergies (verified): No Known Drug Allergies  Comments:  Nurse/Medical Assistant: cpx, labs done--discuss lap band--has had cxr, UGI,  also psych eval this afternoon  The patient's medications and allergies were reviewed with the patient and were updated in the Medication and Allergy Lists. 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) DEGENERATIVE DISC DISEASE (ICD-722.6) Hyperlipidemia Hypertension Gout  Past Surgical History: Last updated: 11/17/2006 Hernia repair x2  Family History: Last updated: 11/18/2006 mother-breast ca father htn  Social History: Last updated: 11/18/2006 Occupation: [employer]---works 20 days monthly [place] Divorced Current Smoker Alcohol use-yes  Risk Factors: Smoking Status: current (11/18/2006) Packs/Day: 1-2 ppd (11/17/2006)  Review of Systems       All other systems reviewed and were negative   Physical Exam  General:  Well-developed,well-nourished,in no acute distress;  alert,appropriate and cooperative throughout examination  morbidly obese Head:  normocephalic and atraumatic.   Eyes:  pupils equal and pupils round.   Ears:  R ear normal and L ear normal.   Nose:  no external deformity and no external erythema.   Neck:  No deformities, masses, or tenderness noted. Chest Wall:  No deformities, masses, tenderness or gynecomastia noted. Breasts:  No masses or gynecomastia noted Lungs:  Normal respiratory effort, chest expands symmetrically. Lungs are clear to auscultation, no crackles or wheezes. Heart:  Normal rate and regular rhythm. S1 and S2 normal without gallop, murmur, click, rub or other extra sounds. Abdomen:  Bowel sounds positive,abdomen soft and non-tender without masses, organomegaly or hernias noted. morbidly obese Msk:  No deformity or scoliosis noted of thoracic or lumbar spine.   Pulses:  R radial normal and L radial normal.   Neurologic:  cranial nerves II-XII intact and gait normal.   Skin:  turgor normal and color normal.   Psych:  normally interactive and good eye contact.     Impression & Recommendations:  Problem # 1:  PREVENTIVE HEALTH CARE (ICD-V70.0) helath maint UTD Obesity is main problem--- he is being evaluated by surgical group in [place] diet , exercise and weight loss are a necessity... he says he had labs and CXR last week  From a preoperative point of view I think he is adequate risk for surgery.   Problem # 2:  OBESITY (ICD-278.00) this is clearly his main problem I support his decision to consider surgical weight loss  options.   Complete Medication List: 1)  Lisinopril 40 Mg Tabs (Lisinopril) .Marland Kitchen.. 1 by mouth once daily 2)  Loratidine Otc 10mg   .... Take 1 tablet by mouth once a day  Other Orders: Tdap => 62yrs IM (30160) Admin 1st Vaccine (10932) Echo Referral (Echo)   Immunizations Administered:  Tetanus Vaccine:    Vaccine Type: Tdap    Site: right deltoid    Mfr: GlaxoSmithKline    Dose: 0.5  ml    Route: IM    Given by: Gladis Riffle, RN    Exp. Date: 02/21/2011    Lot #: [id number]

## 2010-05-20 NOTE — Progress Notes (Signed)
Summary: pt req change of dosage on Lisinopril from 40mg  to 20mg   Phone Note Call from Patient Call back at Home Phone [phone]   Caller: Patient Action Taken: Appt Scheduled Today Summary of Call: Pt is currently taking Lisinopril 40mg  once daily, but it would be cheaper if it is written for 20mg  two times a day. Pt req new script for Lisinopril 20mg  two times a day called in to [facility] [address].  Initial call taken by: Lucy Antigua,  March 25, 2009 1:30 PM  Follow-up for Phone Call        ok to do lisinopril 20mg  two tablets once daily per dr swords.Patient notified. Rx will be done to reflect change. Follow-up by: Gladis Riffle, RN,  March 26, 2009 1:25 PM    New/Updated Medications: LISINOPRIL 20 MG TABS (LISINOPRIL) Take two daily. Prescriptions: LISINOPRIL 20 MG TABS (LISINOPRIL) Take two daily.  #180 x 3   Entered by:   Gladis Riffle, RN   Authorized by:   Birdie Sons MD   Signed by:   Gladis Riffle, RN on 03/26/2009   Method used:   Electronically to        [facility]  [phone]* (retail)       [address]       [place], [place]  [address]       Ph: [phone] or [phone]       Fax: [phone]   RxID:   [id number]

## 2010-05-20 NOTE — Assessment & Plan Note (Signed)
Summary: GOUT/MHF   Vital Signs:  Patient Profile:   50 Years Old Male Height:     72 inches (182.88 cm) Pulse rate:   64 / minute Resp:     12 per minute BP sitting:   158 / 86  (left arm) Cuff size:   large  Vitals Entered By: Gladis Riffle, RN (March 28, 2008 10:04 AM)                 Chief Complaint:  FU gout left foot and seen out of town 12/4--told uric acid was high and get FU.  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) DEGENERATIVE DISC DISEASE (ICD-722.6) Hyperlipidemia Hypertension  Past Surgical History: Hernia repair x2 Social History: Occupation: [employer]---works 20 days monthly [place] Divorced Current Smoker Alcohol use-yes  Family History: mother-breast ca father htn no other complaints in a complete ROS     Updated Prior Medication List: LISINOPRIL-HYDROCHLOROTHIAZIDE 20-12.5 MG TABS (LISINOPRIL-HYDROCHLOROTHIAZIDE) once daily ALAVERT 10 MG  TBDP (LORATADINE) one by mouth daily INDOMETHACIN 50 MG CAPS (INDOMETHACIN) Take 1 capsule by mouth three times a day  Current Allergies (reviewed today): No known allergies   Past Medical History:    CARCINOMA, SKIN, SQUAMOUS CELL, 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   Eyes:     pupils equal and pupils round.   Neck:     No deformities, masses, or tenderness noted. Lungs:     Normal respiratory effort, chest expands symmetrically. Lungs are clear to auscultation, no crackles or wheezes. Abdomen:     Bowel  sounds positive,abdomen soft and non-tender without masses, organomegaly or hernias noted.  obese Msk:     minimal swelling and erythema left mtp joint Extremities:     No clubbing, cyanosis, edema, or deformity noted with normal full range of motion of all joints.   Skin:     turgor normal and color normal.   Psych:     memory intact for recent and remote and normally interactive.      Impression & Recommendations:  Problem # 1:  GOUT (ICD-274.9) ok to use as needed indomethacin dc hctz---see htn His updated medication list for this problem includes:    Indomethacin 50 Mg Caps (Indomethacin) .Marland Kitchen... Take 1 capsule by mouth three times a day as needed gout flare   Problem # 2:  HYPERTENSION (ICD-401.9) change meds side effects discussed His updated medication list for this problem includes:    Lisinopril 40 Mg Tabs (Lisinopril) .Marland Kitchen... 1 by mouth once daily   Problem # 3:  OBESITY (ICD-278.00) this is really his main problem he understands the need to lose weight.   Complete Medication List: 1)  Lisinopril 40 Mg Tabs (Lisinopril) .Marland Kitchen.. 1 by mouth once daily 2)  Alavert 10 Mg Tbdp (Loratadine) .... One by mouth daily 3)  Indomethacin 50 Mg Caps (Indomethacin) .... Take 1 capsule by mouth three times a day as needed gout flare    Prescriptions: INDOMETHACIN 50 MG CAPS (INDOMETHACIN) Take  1 capsule by mouth three times a day as needed gout flare  #30 x 1   Entered and Authorized by:   Birdie Sons MD   Signed by:   Birdie Sons MD on 03/28/2008   Method used:   Electronically to        [facility]  [phone]* (retail)       [address]       [place], [place]  [address]       Ph: [phone] or [phone]       Fax: [phone]   RxID:   [id number] LISINOPRIL 40 MG TABS (LISINOPRIL) 1 by mouth once daily  #90 x 3   Entered and Authorized by:   Birdie Sons MD   Signed by:   Birdie Sons MD on 03/28/2008   Method used:    Electronically to        [facility]  [phone]* (retail)       [address]       [place], [place]  [address]       Ph: [phone] or [phone]       Fax: [phone]   RxID:   [id number] INDOMETHACIN 50 MG CAPS (INDOMETHACIN) Take 1 capsule by mouth three times a day as needed gout flare  #30 x 1   Entered and Authorized by:   Birdie Sons MD   Signed by:   Birdie Sons MD on 03/28/2008   Method used:   [facility]  [place] [address] (retail)       [address]       [place], [place]  [address]       Ph: [phone] or [phone]       Fax: [phone]   RxID:   [id number] LISINOPRIL 40 MG TABS (LISINOPRIL) 1 by mouth once daily  #90 x 3   Entered and Authorized by:   Birdie Sons MD   Signed by:   Birdie Sons MD on 03/28/2008   Method used:   [facility]  [place] [address] (retail)       [address]       [place], [place]  [address]       Ph: [phone] or [phone]       Fax: [phone]   RxID:   [id number]  ]

## 2010-05-20 NOTE — Letter (Signed)
Summary: Generic Letter  [place] [facility]  [address] [place], [place] [address]   Phone: [phone]  Fax: [phone]    01/22/2009  Dr. Donella Stade c/oAIGB [address] [employer] [phone]  Dr. Jimmie Molly  Mr. Matthew Savage has been a patient in my Internal Practice for greater that 10 years.  He has a BMI greater than 40 and suffers from Gout, Sleep Apnea, Hypertension, Hyperlipidemia, Degenerative Disc Disease, and Aortic Stenosis.  Matthew Savage has attempted doctor supervised documented weight-loss programs multiple times over the past years and continually failed weight loss.  Please consider this pateint for gastric bypass surgery, which will extend his life expectancy.    Sincerely,   Dr. Birdie Sons

## 2010-06-09 ENCOUNTER — Encounter: Payer: Self-pay | Admitting: Internal Medicine

## 2010-06-10 ENCOUNTER — Encounter: Payer: Self-pay | Admitting: Internal Medicine

## 2010-06-10 ENCOUNTER — Ambulatory Visit (INDEPENDENT_AMBULATORY_CARE_PROVIDER_SITE_OTHER): Payer: BC Managed Care – PPO | Admitting: Internal Medicine

## 2010-06-10 VITALS — BP 118/64 | HR 64 | Temp 98.7°F | Ht 72.0 in | Wt 213.0 lb

## 2010-06-10 DIAGNOSIS — G5600 Carpal tunnel syndrome, unspecified upper limb: Secondary | ICD-10-CM

## 2010-06-10 DIAGNOSIS — R209 Unspecified disturbances of skin sensation: Secondary | ICD-10-CM

## 2010-06-10 DIAGNOSIS — M25519 Pain in unspecified shoulder: Secondary | ICD-10-CM

## 2010-06-10 NOTE — Progress Notes (Signed)
  Subjective:    Patient ID: Matthew Savage, male    DOB: 12-13-1960, 50 y.o.   MRN: [id number]  HPI Comes in for eval of right shoulder pain and hand tingling. Ongoing for a few months. No weakness. sxs are progressive. Hand tinglin is limited to first three fingers on right. No nocturnal sxs.   Past Medical History  Diagnosis Date  . Cancer     skin, squamous cell, face  . DDD (degenerative disc disease)   . Hyperlipidemia   . Hypertension   . Gout    Past Surgical History  Procedure Date  . Hernia repair     X 2  . Laparoscopic gastric banding     reports that he has been smoking.  He does not have any smokeless tobacco history on file. He reports that he drinks alcohol. His drug history not on file. family history includes Breast cancer in his mother; Cancer in his mother; and Hypertension in his father.       Review of Systems  patient denies chest pain, shortness of breath, orthopnea. Denies lower extremity edema, abdominal pain, change in appetite, change in bowel movements. Patient denies rashes, musculoskeletal complaints. No other specific complaints in a complete review of systems.      Objective:   Physical Exam  no acute distress. He does appear older than his stated age. He has full range of motion of both shoulders. Full range of motion of neck. Deep tendon reflexes are intact in the upper extremities. No upper extremity rashes. Tinel's and Phalen's signs are both negative.       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 I think this is the most likely diagnosis. I think he needs to be evaluated if his symptoms do not resolve with standard treatment. I asked him to get a wrist brace for his most affected extremity. I've asked him to wear this at night. He'll call me in 2 weeks if his symptoms don't resolve. It is possible that his symptoms are related to a radiculopathy that may need further evaluation.

## 2010-06-13 DIAGNOSIS — G5600 Carpal tunnel syndrome, unspecified upper limb: Secondary | ICD-10-CM | POA: Insufficient documentation

## 2010-09-02 NOTE — Procedures (Signed)
NAME:  Matthew Savage, [place] NO.:  [id number]   MEDICAL RECORD NO.:  [id number]          PATIENT TYPE:  OUT   LOCATION:  SLEEP CENTER                 FACILITY[facility]  [facility]   PHYSICIAN:  Barbaraann Share, MD,FCCPDATE OF BIRTH:  1961-03-04   DATE OF STUDY:  09/21/2007                            NOCTURNAL POLYSOMNOGRAM   REFERRING PHYSICIAN:   LOCATION:  Sleep lab.   REFERRING PHYSICIAN:  Dr. Birdie Sons.   INDICATION FOR STUDY:  Hypersomnia with sleep apnea.   EPWORTH SLEEPINESS SCORE:  19.   SLEEP ARCHITECTURE:  The patient had a total sleep time of 122 minutes  during the diagnostic portion of the study and 174 minutes during the  titration portion.  Sleep onset latency was normal at 15 minutes, and  the patient had very little slow wave sleep in REM.  Sleep efficiency  was decreased at 87% during the diagnostic portion and 82% during the  titration portion.   RESPIRATORY DATA:  The patient by split night protocol was found to have  200 obstructive events in the first 122 minutes of sleep.  This gave him  an apnea hypopnea index of 98 events per hour during the diagnostic  portion of the study.  The events were not positional and there was very  loud snoring noted throughout.  By protocol, the patient was placed on a  large Quattro full face mask, and ultimately titrated to a final  pressure of 18 cm of water with good control of his events.   OXYGEN DATA:  There was O2 desaturation as low as 72% with the patient's  obstructive events.   CARDIAC DATA:  No clinically significant arrhythmias were noted.   MOVEMENT-PARASOMNIA:  No significant leg jerks or abnormal behaviors  were noted.   IMPRESSIONS-RECOMMENDATIONS:  Very severe obstructive sleep  apnea/hypopnea syndrome with an apnea-hypopnea index of 98 events per  hour and O2 desaturation as low as 72% during the first half of the  night.  The patient was then placed on a large Quattro full face mask,  and  ultimately titrated to a final pressure of 18 cm of  water.  This gave good control of the patient's obstructive events.  The  patient should also be encouraged to work aggressively on weight loss.      Barbaraann Share, MD,FCCP  Diplomate, American Board of Sleep  Medicine  Electronically Signed     [facility]/MED[facility]  D:  09/22/2007 08:06:01  T:  09/22/2007 16:10:96  Job:  [id number]

## 2010-09-08 ENCOUNTER — Telehealth: Payer: Self-pay | Admitting: *Deleted

## 2010-09-08 NOTE — Telephone Encounter (Signed)
patient  Would like a prescription for gout

## 2010-09-08 NOTE — Telephone Encounter (Signed)
Trial indocin 75 mg po bid for 5 days. Take with food

## 2010-09-09 MED ORDER — INDOMETHACIN ER 75 MG PO CPCR: 75.0000 mg | ORAL_CAPSULE | Freq: Two times a day (BID) | ORAL | Status: AC

## 2010-09-09 NOTE — Telephone Encounter (Signed)
Spoke with patient and rx sent 

## 2010-09-09 NOTE — Telephone Encounter (Signed)
[facility].

## 2011-03-05 ENCOUNTER — Encounter: Payer: BC Managed Care – PPO | Admitting: Internal Medicine

## 2011-04-06 ENCOUNTER — Ambulatory Visit (INDEPENDENT_AMBULATORY_CARE_PROVIDER_SITE_OTHER): Payer: BC Managed Care – PPO | Admitting: Internal Medicine

## 2011-04-06 ENCOUNTER — Other Ambulatory Visit: Payer: Self-pay | Admitting: Internal Medicine

## 2011-04-06 ENCOUNTER — Encounter: Payer: Self-pay | Admitting: Internal Medicine

## 2011-04-06 ENCOUNTER — Ambulatory Visit
Admission: RE | Admit: 2011-04-06 | Discharge: 2011-04-06 | Disposition: A | Discharge status: Home / Self Care | Payer: BC Managed Care – PPO | Source: Ambulatory Visit | Attending: Internal Medicine | Admitting: Internal Medicine

## 2011-04-06 VITALS — BP 152/84 | HR 76 | Temp 97.5°F | Ht 71.0 in | Wt 238.0 lb

## 2011-04-06 DIAGNOSIS — I359 Nonrheumatic aortic valve disorder, unspecified: Secondary | ICD-10-CM

## 2011-04-06 DIAGNOSIS — M5412 Radiculopathy, cervical region: Secondary | ICD-10-CM

## 2011-04-06 DIAGNOSIS — F17200 Nicotine dependence, unspecified, uncomplicated: Secondary | ICD-10-CM

## 2011-04-06 DIAGNOSIS — IMO0002 Reserved for concepts with insufficient information to code with codable children: Secondary | ICD-10-CM

## 2011-04-06 DIAGNOSIS — Z0000 Encounter for general adult medical examination without abnormal findings: Secondary | ICD-10-CM

## 2011-04-06 DIAGNOSIS — I10 Essential (primary) hypertension: Secondary | ICD-10-CM

## 2011-04-06 LAB — BASIC METABOLIC PANEL
BUN: 11 mg/dL (ref 6–23)
CO2: 27 mEq/L (ref 19–32)
Calcium: 8.9 mg/dL (ref 8.4–10.5)
Chloride: 104 mEq/L (ref 96–112)
Creatinine, Ser: 0.7 mg/dL (ref 0.4–1.5)
GFR: 120.56 mL/min (ref >60.00)
Glucose, Bld: 93 mg/dL (ref 70–99)
Potassium: 4.9 mEq/L (ref 3.5–5.1)
Sodium: 140 mEq/L (ref 135–145)

## 2011-04-06 LAB — POCT URINALYSIS DIPSTICK
Bilirubin, UA: NEGATIVE
Glucose, UA: NEGATIVE
Ketones, UA: NEGATIVE
Leukocytes, UA: NEGATIVE
Nitrite, UA: NEGATIVE
Spec Grav, UA: 1.02
Urobilinogen, UA: 0.2
pH, UA: 7

## 2011-04-06 LAB — LIPID PANEL
Cholesterol: 208 mg/dL — ABNORMAL HIGH (ref 0–200)
HDL: 58.5 mg/dL (ref >39.00)
Total CHOL/HDL Ratio: 4
Triglycerides: 65 mg/dL (ref 0.0–149.0)
VLDL: 13 mg/dL (ref 0.0–40.0)

## 2011-04-06 LAB — CBC WITH DIFFERENTIAL/PLATELET
Basophils Absolute: 0 10*3/uL (ref 0.0–0.1)
Basophils Relative: 0.4 % (ref 0.0–3.0)
Eosinophils Absolute: 0.1 10*3/uL (ref 0.0–0.7)
Eosinophils Relative: 1.7 % (ref 0.0–5.0)
HCT: 43.1 % (ref 39.0–52.0)
Hemoglobin: 15 g/dL (ref 13.0–17.0)
Lymphocytes Relative: 20.6 % (ref 12.0–46.0)
Lymphs Abs: 1.5 10*3/uL (ref 0.7–4.0)
MCHC: 34.7 g/dL (ref 30.0–36.0)
MCV: 99.2 fl (ref 78.0–100.0)
Monocytes Absolute: 0.5 10*3/uL (ref 0.1–1.0)
Monocytes Relative: 7.2 % (ref 3.0–12.0)
Neutro Abs: 5 10*3/uL (ref 1.4–7.7)
Neutrophils Relative %: 70.1 % (ref 43.0–77.0)
Platelets: 272 10*3/uL (ref 150.0–400.0)
RBC: 4.34 Mil/uL (ref 4.22–5.81)
RDW: 14.1 % (ref 11.5–14.6)
WBC: 7.2 10*3/uL (ref 4.5–10.5)

## 2011-04-06 LAB — HEPATIC FUNCTION PANEL
ALT: 31 U/L (ref 0–53)
AST: 31 U/L (ref 0–37)
Albumin: 3.3 g/dL — ABNORMAL LOW (ref 3.5–5.2)
Alkaline Phosphatase: 68 U/L (ref 39–117)
Bilirubin, Direct: 0.1 mg/dL (ref 0.0–0.3)
Total Bilirubin: 0.5 mg/dL (ref 0.3–1.2)
Total Protein: 6.1 g/dL (ref 6.0–8.3)

## 2011-04-06 LAB — PSA: PSA: 0.29 ng/mL (ref 0.10–4.00)

## 2011-04-06 LAB — TSH: TSH: 1.08 u[IU]/mL (ref 0.35–5.50)

## 2011-04-06 LAB — LDL CHOLESTEROL, DIRECT: Direct LDL: 135.1 mg/dL

## 2011-04-06 NOTE — Assessment & Plan Note (Signed)
Home bps 130s/80s He will continue to monitor at home

## 2011-04-06 NOTE — Assessment & Plan Note (Signed)
He understands need to quit 

## 2011-04-06 NOTE — Assessment & Plan Note (Signed)
No sxs, no need for evaluation at this time

## 2011-04-06 NOTE — Assessment & Plan Note (Signed)
Suspect he has an irritated nerve root as the cause of his sxs Given duration he will need MRI

## 2011-04-06 NOTE — Progress Notes (Signed)
Patient ID: Matthew Savage, male   DOB: 01/21/1961, 50 y.o.   MRN: [id number] CPX  Complains of right shoulder pain with radiation to right arm. Ongoing for many months without change  Past Medical History  Diagnosis Date  . Cancer     skin, squamous cell, face  . DDD (degenerative disc disease)   . Hyperlipidemia   . Hypertension   . Gout     History   Social History  . Marital Status: Divorced    Spouse Name: N/A    Number of Children: N/A  . Years of Education: N/A   Occupational History  . Not on file.   Social History Main Topics  . Smoking status: Current Everyday Smoker -- 2.0 packs/day    Types: Cigarettes  . Smokeless tobacco: Not on file  . Alcohol Use: Yes  . Drug Use: Not on file  . Sexually Active: Not on file   Other Topics Concern  . Not on file   Social History Narrative  . No narrative on file    Past Surgical History  Procedure Date  . Hernia repair     X 2  . Laparoscopic gastric banding     Family History  Problem Relation Age of Onset  . Cancer Mother     carcinoma  . Breast cancer Mother   . Hypertension Father     No Known Allergies  No current outpatient prescriptions on file prior to visit.     patient denies chest pain, shortness of breath, orthopnea. Denies lower extremity edema, abdominal pain, change in appetite, change in bowel movements. Patient denies rashes, musculoskeletal complaints. No other specific complaints in a complete review of systems.   BP 152/84  Pulse 76  Temp(Src) 97.5 F (36.4 C) (Oral)  Ht 5\' 11"  (1.803 m)  Wt 238 lb (107.956 kg)  BMI 33.19 kg/m2  well-developed well-nourished male in no acute distress. HEENT exam atraumatic, normocephalic, neck supple without jugular venous distention. Chest clear to auscultation cardiac exam S1-S2 are regular. Abdominal exam overweight with bowel sounds, soft and nontender. Extremities no edema. Neurologic exam is alert with a normal gait.  Well Visit : heatlh  maint UTD

## 2011-04-07 ENCOUNTER — Ambulatory Visit
Admission: RE | Admit: 2011-04-07 | Discharge: 2011-04-07 | Disposition: A | Discharge status: Home / Self Care | Payer: BC Managed Care – PPO | Source: Ambulatory Visit | Attending: Internal Medicine | Admitting: Internal Medicine

## 2011-04-07 ENCOUNTER — Ambulatory Visit (AMBULATORY_SURGERY_CENTER): Payer: BC Managed Care – PPO

## 2011-04-07 ENCOUNTER — Other Ambulatory Visit: Payer: BC Managed Care – PPO

## 2011-04-07 VITALS — Ht 72.0 in | Wt 239.3 lb

## 2011-04-07 DIAGNOSIS — Z8371 Family history of colonic polyps: Secondary | ICD-10-CM

## 2011-04-07 DIAGNOSIS — Z1211 Encounter for screening for malignant neoplasm of colon: Secondary | ICD-10-CM

## 2011-04-07 DIAGNOSIS — M5412 Radiculopathy, cervical region: Secondary | ICD-10-CM

## 2011-04-07 MED ORDER — PEG-KCL-NACL-NASULF-NA ASC-C 100 G PO SOLR: 1.0000 | Freq: Once | ORAL | Status: AC

## 2011-04-22 ENCOUNTER — Telehealth: Payer: Self-pay | Admitting: Internal Medicine

## 2011-04-22 DIAGNOSIS — G589 Mononeuropathy, unspecified: Secondary | ICD-10-CM

## 2011-04-22 NOTE — Telephone Encounter (Signed)
Gave pt results order placed

## 2011-04-22 NOTE — Telephone Encounter (Signed)
Pt would like MRI results

## 2011-04-29 ENCOUNTER — Encounter: Payer: Self-pay | Admitting: Speech Pathology

## 2011-05-06 ENCOUNTER — Ambulatory Visit (AMBULATORY_SURGERY_CENTER): Payer: BC Managed Care – PPO | Admitting: Internal Medicine

## 2011-05-06 ENCOUNTER — Encounter: Payer: Self-pay | Admitting: Internal Medicine

## 2011-05-06 DIAGNOSIS — Z8371 Family history of colonic polyps: Secondary | ICD-10-CM

## 2011-05-06 DIAGNOSIS — Z1211 Encounter for screening for malignant neoplasm of colon: Secondary | ICD-10-CM

## 2011-05-06 DIAGNOSIS — D126 Benign neoplasm of colon, unspecified: Secondary | ICD-10-CM

## 2011-05-06 MED ORDER — SODIUM CHLORIDE 0.9 % IV SOLN: 500.0000 mL | INTRAVENOUS | Status: DC

## 2011-05-06 NOTE — Patient Instructions (Addendum)
Green and blue discharge instructions reviewed with patient and care partner.  Impressions/recommendations:  Polyps (handout given) High Fiber Diet handout given  Resume medications as you were taking them prior to your procedure except you need to avoid aspirin, aspirin containing products or anti-inflammatory medications for two weeks.  Per Dr. Rhea Belton, okay to start steroids tomorrow but do not start ibuprofen for 2 weeks.

## 2011-05-06 NOTE — Op Note (Signed)
[facility] [address] [place], [place]  [address]  COLONOSCOPY PROCEDURE REPORT  PATIENT:  Matthew, Savage  MR#:  [id number] BIRTHDATE:  09-17-1960, 50 yrs. old  GENDER:  male ENDOSCOPIST:  Carie Caddy. Darlina Mccaughey, MD REF. BY:  Birdie Sons, M.D. PROCEDURE DATE:  05/06/2011 PROCEDURE:  Colonoscopy with snare polypectomy, Colon with cold biopsy polypectomy ASA CLASS:  Class II INDICATIONS:  Elevated Risk Screening (family history of colon polyps, father and brother).  First colonoscopy MEDICATIONS:   These medications were titrated to patient response per physician's verbal order, Versed 12 mg IV, Fentanyl 100 mcg IV  DESCRIPTION OF PROCEDURE:   After the risks benefits and alternatives of the procedure were thoroughly explained, informed consent was obtained.  Digital rectal exam was performed and revealed no rectal masses.   The LB [id number] endoscope was introduced through the anus and advanced to the cecum, which was identified by both the appendix and ileocecal valve, without limitations.  The quality of the prep was good, using MoviPrep. The instrument was then slowly withdrawn as the colon was fully examined. <<PROCEDUREIMAGES>>  FINDINGS:  Two sessile polyps measuring 5 - 6 mm were found in the ascending colon. Polyps were snared without cautery. Retrieval was successful. There was minimal oozing noted after polypectomy at one of the ascending colon polypectomy sites, therefore the tip of a hot snare was used with success for hemostasis.  An 8 mm sessile polyp was found in the sigmoid colon. Polyp was snared without cautery. Retrieval was successful.  Two sessile polyps measuring < 5 mm were found in the recto-sigmoid colon. The polyps were removed using cold biopsy forceps.  Moderate diverticulosis was found in the left colon.   Retroflexed views in the rectum revealed no abnormalities.   The scope was then withdrawn from the cecum and the procedure  completed.  COMPLICATIONS:  None ENDOSCOPIC IMPRESSION: 1) Two polyps in the ascending colon.  Removed and sent to pathology. 2) Sessile polyp in the sigmoid colon. Removed and sent to pathology. 3) Two polyps in the recto-sigmoid colon. Removed and sent to pathology. 4) Moderate diverticulosis in the left colon  RECOMMENDATIONS: 1) Hold aspirin, aspirin products, and anti-inflammatory medication for 2 weeks. 2) High fiber diet. 3) If the polyp(s) removed today are proven to be adenomatous (pre-cancerous) polyps, you will need a colonoscopy in 3 years. Otherwise you should continue to follow colorectal cancer screening guidelines for "elevated risk" patients with a colonoscopy in 5 years (based on family history of polyps). 4) You will receive a letter within 1-2 weeks with the results of your biopsy as well as final recommendations. Please call my office if you have not received a letter after 3 weeks.  Carie Caddy. Rhea Belton, MD  CC:  The Patient Lindley Magnus, MD  n. Rosalie DoctorCarie Caddy. Deaunte Dente at 05/06/2011 11:20 AM  Neoma Laming, [phone]

## 2011-05-06 NOTE — Progress Notes (Signed)
Patient did not experience any of the following events: a burn prior to discharge; a fall within the facility; wrong site/side/patient/procedure/implant event; or a hospital transfer or hospital admission upon discharge from the facility. (G8907) Patient did not have preoperative order for IV antibiotic SSI prophylaxis. (G8918)  

## 2011-05-07 ENCOUNTER — Telehealth: Payer: Self-pay

## 2011-05-07 NOTE — Telephone Encounter (Signed)

## 2011-05-12 ENCOUNTER — Encounter: Payer: Self-pay | Admitting: Internal Medicine

## 2011-11-04 ENCOUNTER — Telehealth: Payer: Self-pay | Admitting: Internal Medicine

## 2011-11-04 DIAGNOSIS — L729 Follicular cyst of the skin and subcutaneous tissue, unspecified: Secondary | ICD-10-CM

## 2011-11-04 NOTE — Telephone Encounter (Signed)
Probably best to refer to general surgery

## 2011-11-04 NOTE — Telephone Encounter (Signed)
Referral order placed, pt aware 

## 2011-11-04 NOTE — Telephone Encounter (Signed)
Patient called stating that he has a cyst on both butt cheeks and he would like to have them completely removed and not just drained. Please advise.

## 2011-11-30 ENCOUNTER — Encounter (INDEPENDENT_AMBULATORY_CARE_PROVIDER_SITE_OTHER): Payer: Self-pay | Admitting: Surgery

## 2011-11-30 ENCOUNTER — Ambulatory Visit (INDEPENDENT_AMBULATORY_CARE_PROVIDER_SITE_OTHER): Payer: BC Managed Care – PPO | Admitting: Surgery

## 2011-11-30 VITALS — BP 130/72 | HR 72 | Temp 97.2°F | Resp 16 | Ht 72.0 in | Wt 252.1 lb

## 2011-11-30 DIAGNOSIS — F17200 Nicotine dependence, unspecified, uncomplicated: Secondary | ICD-10-CM

## 2011-11-30 DIAGNOSIS — L723 Sebaceous cyst: Secondary | ICD-10-CM | POA: Insufficient documentation

## 2011-11-30 DIAGNOSIS — L708 Other acne: Secondary | ICD-10-CM

## 2011-11-30 NOTE — Patient Instructions (Signed)
Epidermal Cyst An epidermal cyst is usually a small, painless lump under the skin. Cysts often occur on the face, neck, stomach, chest, or genitals. The cyst may be filled with a bad smelling paste. Do not pop your cyst. Popping the cyst can cause pain and puffiness (swelling). HOME CARE   Only take medicines as told by your doctor.   Take your medicine (antibiotics) as told. Finish it even if you start to feel better.  GET HELP RIGHT AWAY IF:  Your cyst is tender, red, or puffy.   You are not getting better, or you are getting worse.   You have any questions or concerns.  MAKE SURE YOU:  Understand these instructions.   Will watch your condition.   Will get help right away if you are not doing well or get worse.  Document Released: 05/14/2004 Document Revised: 03/26/2011 Document Reviewed: 10/13/2010 [facility] Patient Information 2012 [place], [place].  Smoking Cessation This document explains the best ways for you to quit smoking and new treatments to help. It lists new medicines that can double or triple your chances of quitting and quitting for good. It also considers ways to avoid relapses and concerns you may have about quitting, including weight gain. NICOTINE: A POWERFUL ADDICTION If you have tried to quit smoking, you know how hard it can be. It is hard because nicotine is a very addictive drug. For some people, it can be as addictive as heroin or cocaine. Usually, people make 2 or 3 tries, or more, before finally being able to quit. Each time you try to quit, you can learn about what helps and what hurts. Quitting takes hard work and a lot of effort, but you can quit smoking. QUITTING SMOKING IS ONE OF THE MOST IMPORTANT THINGS YOU WILL EVER DO.  You will live longer, feel better, and live better.   The impact on your body of quitting smoking is felt almost immediately:   Within 20 minutes, blood pressure decreases. Pulse returns to its normal level.   After 8 hours,  carbon monoxide levels in the blood return to normal. Oxygen level increases.   After 24 hours, chance of heart attack starts to decrease. Breath, hair, and body stop smelling like smoke.   After 48 hours, damaged nerve endings begin to recover. Sense of taste and smell improve.   After 72 hours, the body is virtually free of nicotine. Bronchial tubes relax and breathing becomes easier.   After 2 to 12 weeks, lungs can hold more air. Exercise becomes easier and circulation improves.   Quitting will reduce your risk of having a heart attack, stroke, cancer, or lung disease:   After 1 year, the risk of coronary heart disease is cut in half.   After 5 years, the risk of stroke falls to the same as a nonsmoker.   After 10 years, the risk of lung cancer is cut in half and the risk of other cancers decreases significantly.   After 15 years, the risk of coronary heart disease drops, usually to the level of a nonsmoker.   If you are pregnant, quitting smoking will improve your chances of having a healthy baby.   The people you live with, especially your children, will be healthier.   You will have extra money to spend on things other than cigarettes.  FIVE KEYS TO QUITTING Studies have shown that these 5 steps will help you quit smoking and quit for good. You have the best chances of quitting if  you use them together: 1. Get ready.  2. Get support and encouragement.  3. Learn new skills and behaviors.  4. Get medicine to reduce your nicotine addiction and use it correctly.  5. Be prepared for relapse or difficult situations. Be determined to continue trying to quit, even if you do not succeed at first.  1. GET READY  Set a quit date.   Change your environment.   Get rid of ALL cigarettes, ashtrays, matches, and lighters in your home, car, and place of work.   Do not let people smoke in your home.   Review your past attempts to quit. Think about what worked and what did not.   Once  you quit, do not smoke. NOT EVEN A PUFF!  2. GET SUPPORT AND ENCOURAGEMENT Studies have shown that you have a better chance of being successful if you have help. You can get support in many ways.  Tell your family, friends, and coworkers that you are going to quit and need their support. Ask them not to smoke around you.   Talk to your caregivers (doctor, dentist, nurse, pharmacist, psychologist, and/or smoking counselor).   Get individual, group, or telephone counseling and support. The more counseling you have, the better your chances are of quitting. Programs are available at [employer] and health centers. Call your local health department for information about programs in your area.   Spiritual beliefs and practices may help some smokers quit.   Quit meters are Photographer that keep track of quit statistics, such as amount of "quit-time," cigarettes not smoked, and money saved.   Many smokers find one or more of the many self-help books available useful in helping them quit and stay off tobacco.  3. LEARN NEW SKILLS AND BEHAVIORS  Try to distract yourself from urges to smoke. Talk to someone, go for a walk, or occupy your time with a task.   When you first try to quit, change your routine. Take a different route to work. Drink tea instead of coffee. Eat breakfast in a different place.   Do something to reduce your stress. Take a hot bath, exercise, or read a book.   Plan something enjoyable to do every day. Reward yourself for not smoking.   Explore interactive web-based programs that specialize in helping you quit.  4. GET MEDICINE AND USE IT CORRECTLY Medicines can help you stop smoking and decrease the urge to smoke. Combining medicine with the above behavioral methods and support can quadruple your chances of successfully quitting smoking. The U.S. Food and Drug Administration (FDA) has approved 7 medicines to help you quit smoking. These  medicines fall into 3 categories.  Nicotine replacement therapy (delivers nicotine to your body without the negative effects and risks of smoking):   Nicotine gum: Available over-the-counter.   Nicotine lozenges: Available over-the-counter.   Nicotine inhaler: Available by prescription.   Nicotine nasal spray: Available by prescription.   Nicotine skin patches (transdermal): Available by prescription and over-the-counter.   Antidepressant medicine (helps people abstain from smoking, but how this works is unknown):   Bupropion sustained-release (SR) tablets: Available by prescription.   Nicotinic receptor partial agonist (simulates the effect of nicotine in your brain):   Varenicline tartrate tablets: Available by prescription.   Ask your caregiver for advice about which medicines to use and how to use them. Carefully read the information on the package.   Everyone who is trying to quit may benefit from using a  medicine. If you are pregnant or trying to become pregnant, nursing an infant, you are under age 30, or you smoke fewer than 10 cigarettes per day, talk to your caregiver before taking any nicotine replacement medicines.   You should stop using a nicotine replacement product and call your caregiver if you experience nausea, dizziness, weakness, vomiting, fast or irregular heartbeat, mouth problems with the lozenge or gum, or redness or swelling of the skin around the patch that does not go away.   Do not use any other product containing nicotine while using a nicotine replacement product.   Talk to your caregiver before using these products if you have diabetes, heart disease, asthma, stomach ulcers, you had a recent heart attack, you have high blood pressure that is not controlled with medicine, a history of irregular heartbeat, or you have been prescribed medicine to help you quit smoking.  5. BE PREPARED FOR RELAPSE OR DIFFICULT SITUATIONS  Most relapses occur within the  first 3 months after quitting. Do not be discouraged if you start smoking again. Remember, most people try several times before they finally quit.   You may have symptoms of withdrawal because your body is used to nicotine. You may crave cigarettes, be irritable, feel very hungry, cough often, get headaches, or have difficulty concentrating.   The withdrawal symptoms are only temporary. They are strongest when you first quit, but they will go away within 10 to 14 days.  Here are some difficult situations to watch for:  Alcohol. Avoid drinking alcohol. Drinking lowers your chances of successfully quitting.   Caffeine. Try to reduce the amount of caffeine you consume. It also lowers your chances of successfully quitting.   Other smokers. Being around smoking can make you want to smoke. Avoid smokers.   Weight gain. Many smokers will gain weight when they quit, usually less than 10 pounds. Eat a healthy diet and stay active. Do not let weight gain distract you from your main goal, quitting smoking. Some medicines that help you quit smoking may also help delay weight gain. You can always lose the weight gained after you quit.   Bad mood or depression. There are a lot of ways to improve your mood other than smoking.  If you are having problems with any of these situations, talk to your caregiver. SPECIAL SITUATIONS AND CONDITIONS Studies suggest that everyone can quit smoking. Your situation or condition can give you a special reason to quit.  Pregnant women/new mothers: By quitting, you protect your baby's health and your own.   Hospitalized patients: By quitting, you reduce health problems and help healing.   Heart attack patients: By quitting, you reduce your risk of a second heart attack.   Lung, head, and neck cancer patients: By quitting, you reduce your chance of a second cancer.   Parents of children and adolescents: By quitting, you protect your children from illnesses caused by  secondhand smoke.  QUESTIONS TO THINK ABOUT Think about the following questions before you try to stop smoking. You may want to talk about your answers with your caregiver.  Why do you want to quit?   If you tried to quit in the past, what helped and what did not?   What will be the most difficult situations for you after you quit? How will you plan to handle them?   Who can help you through the tough times? Your family? Friends? Caregiver?   What pleasures do you get from smoking? What ways can  you still get pleasure if you quit?  Here are some questions to ask your caregiver:  How can you help me to be successful at quitting?   What medicine do you think would be best for me and how should I take it?   What should I do if I need more help?   What is smoking withdrawal like? How can I get information on withdrawal?  Quitting takes hard work and a lot of effort, but you can quit smoking. FOR MORE INFORMATION  Smokefree.gov (http://www.davis-sullivan.com/) provides free, accurate, evidence-based information and professional assistance to help support the immediate and long-term needs of people trying to quit smoking. Document Released: 03/31/2001 Document Revised: 03/26/2011 Document Reviewed: 01/21/2009 [facility] Patient Information 2012 [place], [place].

## 2011-11-30 NOTE — Progress Notes (Signed)
Subjective:     Patient ID: Matthew Savage, male   DOB: 1960-08-23, 51 y.o.   MRN: [id number]  HPI  TOM MACPHERSON  09-12-1960 [id number]  Patient Care Team: Lindley Magnus, MD as PCP - General Beverley Fiedler, MD as Consulting Physician (Gastroenterology)  This patient is a 51 y.o.male who presents today for surgical evaluation at the request of Dr. Cato Mulligan.   Reason for visit: Inflamed masses around the lower back and buttocks.  Wish for removal.  Patient is a smoking male who's had skin problems all of his life.  Both he and his dad had had numerous nodules removed from their heads, necks and backs.  Has history of chronic blackheads all over.  He's noted a couple lumps on his upper buttocks and lower back.  Over the past several months they've become much more sensitive.  They will swell up.  They have gotten to the point draining but can't but they're becoming rather painful.  Uncomfortable to sit.  He has to do prolonged periods of sitting with his job and travel.  He was hoping to have them removed.  No history of skin infections.  He usually recovers well.  Patient Active Problem List  Diagnosis  . HYPERLIPIDEMIA  . GOUT  . OBESITY  . TOBACCO USE  . HYPERTENSION  . AORTIC STENOSIS  . ALLERGIC  RHINITIS  . DEGENERATIVE DISC DISEASE  . SLEEP APNEA  . Cervical radicular pain  . Sebaceous cysts of trunk (lumbar & gluteal) x3  . Chronic acne/blackheads of trunk, thighs, head, and neck    Past Medical History  Diagnosis Date  . Cancer     skin, squamous cell, face  . DDD (degenerative disc disease)   . Hyperlipidemia   . Hypertension   . Gout   . Numbness and tingling     right arm and hand  . Heart murmur   . Blood in urine     Past Surgical History  Procedure Date  . Laparoscopic gastric banding 03/2009    in [place], [place]  . Hernia repair 1967    X 2    History   Social History  . Marital Status: Divorced    Spouse Name: N/A    Number of Children: N/A  .  Years of Education: N/A   Occupational History  . Not on file.   Social History Main Topics  . Smoking status: Current Everyday Smoker -- 2.0 packs/day for 36 years    Types: Cigarettes  . Smokeless tobacco: Never Used  . Alcohol Use: 6.0 oz/week    12 drink(s) per week     2 per day  . Drug Use: No  . Sexually Active: Not on file   Other Topics Concern  . Not on file   Social History Narrative  . No narrative on file    Family History  Problem Relation Age of Onset  . Cancer Mother     carcinoma  . Breast cancer Mother   . Hypertension Father   . Colon polyps Father   . Colon polyps Brother     Current Outpatient Prescriptions  Medication Sig Dispense Refill  . ibuprofen (ADVIL,MOTRIN) 200 MG tablet Take 800 mg by mouth every 6 (six) hours as needed.        . Multiple Vitamin (MULTIVITAMIN) tablet Take 1 tablet by mouth daily.         Current Facility-Administered Medications  Medication Dose Route Frequency Provider Last Rate  Last Dose  . 0.9 %  sodium chloride infusion  500 mL Intravenous Continuous Beverley Fiedler, MD         No Known Allergies  BP 130/72  Pulse 72  Temp 97.2 F (36.2 C) (Temporal)  Resp 16  Ht 6' (1.829 m)  Wt 252 lb 2 oz (114.363 kg)  BMI 34.19 kg/m2  No results found.   Review of Systems  Constitutional: Negative for fever, chills and diaphoresis.  HENT: Negative for nosebleeds, sore throat, facial swelling, mouth sores, trouble swallowing and ear discharge.   Eyes: Negative for photophobia, discharge and visual disturbance.  Respiratory: Negative for choking, chest tightness, shortness of breath and stridor.   Cardiovascular: Negative for chest pain and palpitations.  Gastrointestinal: Negative for nausea, vomiting, abdominal pain, diarrhea, constipation, blood in stool, abdominal distention, anal bleeding and rectal pain.  Genitourinary: Positive for hematuria. Negative for dysuria, urgency, difficulty urinating and testicular  pain.  Musculoskeletal: Negative for myalgias, back pain, arthralgias and gait problem.  Skin: Negative for color change, pallor, rash and wound.       Chronic blackheads trunk  Neurological: Negative for dizziness, speech difficulty, weakness, numbness and headaches.  Hematological: Negative for adenopathy. Does not bruise/bleed easily.  Psychiatric/Behavioral: Negative for hallucinations, confusion and agitation.       Objective:   Physical Exam  Constitutional: He is oriented to person, place, and time. He appears well-developed and well-nourished. No distress.  HENT:  Head: Normocephalic.  Mouth/Throat: Oropharynx is clear and moist. No oropharyngeal exudate.  Eyes: Conjunctivae and EOM are normal. Pupils are equal, round, and reactive to light. No scleral icterus.  Neck: Normal range of motion. Neck supple. No tracheal deviation present.  Cardiovascular: Normal rate, regular rhythm and intact distal pulses.   Pulmonary/Chest: Effort normal and breath sounds normal. No respiratory distress.  Abdominal: Soft. He exhibits no distension. There is no tenderness. Hernia confirmed negative in the right inguinal area and confirmed negative in the left inguinal area.  Musculoskeletal: Normal range of motion. He exhibits no tenderness.       Back:  Lymphadenopathy:    He has no cervical adenopathy.       Right: No inguinal adenopathy present.       Left: No inguinal adenopathy present.  Neurological: He is alert and oriented to person, place, and time. No cranial nerve deficit. He exhibits normal muscle tone. Coordination normal.  Skin: Skin is warm and dry. No rash noted. He is not diaphoretic. No erythema. No pallor.       Many tattoos on trunk/extremities  Psychiatric: He has a normal mood and affect. His behavior is normal. Judgment and thought content normal.       Assessment:     SQ messes c/w chronic seb cysts / masses.      Plan:     Because they haven't become more  inflamed, I recommended removal.  I think this can be done in the minor or setting with local anesthetic since they don't seem particularly large.  Another option is to consider some sedation.  He felt comfortable with doing it immediately.  I noted I do not have any time today but we will try and coordinated at a convenient time.  While initially disappointed that I could do not did this instant, he seemed more congenial by the end of the visit and more flexible in finding a more convenient time for both of [place]  The pathophysiology of skin & subcutaneous masses was  discussed.  Natural history risks without surgery were discussed.  I recommended surgery to remove the mass.  I explained the technique of removal with use of local anesthesia & possible need for more aggressive sedation/anesthesia for patient comfort.    Risks such as bleeding, infection, heart attack, death, and other risks were discussed.  I noted a good likelihood this will help address the problem.   Possibility that this will not correct all symptoms was explained. Possibility of regrowth/recurrence of the mass was discussed.  We will work to minimize complications. Questions were answered.  The patient expresses understanding & wishes to proceed with surgery.

## 2011-12-03 ENCOUNTER — Encounter (INDEPENDENT_AMBULATORY_CARE_PROVIDER_SITE_OTHER): Payer: Self-pay | Admitting: Surgery

## 2011-12-03 ENCOUNTER — Ambulatory Visit (INDEPENDENT_AMBULATORY_CARE_PROVIDER_SITE_OTHER): Payer: BC Managed Care – PPO | Admitting: Surgery

## 2011-12-03 VITALS — BP 146/82 | HR 78 | Resp 18 | Ht 72.0 in | Wt 252.0 lb

## 2011-12-03 DIAGNOSIS — L723 Sebaceous cyst: Secondary | ICD-10-CM

## 2011-12-03 HISTORY — PX: MASS EXCISION: SHX2000

## 2011-12-03 NOTE — Patient Instructions (Signed)
GENERAL SURGERY: POST OP INSTRUCTIONS  1. DIET: Follow a light bland diet the first 24 hours after arrival home, such as soup, liquids, crackers, etc.  Be sure to include lots of fluids daily.  Avoid fast food or heavy meals as your are more likely to get nauseated.   2. Take your usually prescribed home medications unless otherwise directed. 3. PAIN CONTROL: a. Pain is best controlled by a usual combination of three different methods TOGETHER: i. Ice/Heat ii. Over the counter pain medication iii. Prescription pain medication b. Most patients will experience some swelling and bruising around the incisions.  Ice packs or heating pads (30-60 minutes up to 6 times a day) will help. Use ice for the first few days to help decrease swelling and bruising, then switch to heat to help relax tight/sore spots and speed recovery.  Some people prefer to use ice alone, heat alone, alternating between ice & heat.  Experiment to what works for you.  Swelling and bruising can take several weeks to resolve.   c. It is helpful to take an over-the-counter pain medication regularly for the first few weeks.  Choose one of the following that works best for you: i. Naproxen (Aleve, etc)  Two 220mg tabs twice a day ii. Ibuprofen (Advil, etc) Three 200mg tabs four times a day (every meal & bedtime) iii. Acetaminophen (Tylenol, etc) 500-650mg four times a day (every meal & bedtime) d. A  prescription for pain medication (such as oxycodone, hydrocodone, etc) should be given to you upon discharge.  Take your pain medication as prescribed.  i. If you are having problems/concerns with the prescription medicine (does not control pain, nausea, vomiting, rash, itching, etc), please call us [phone] to see if we need to switch you to a different pain medicine that will work better for you and/or control your side effect better. ii. If you need a refill on your pain medication, please contact your pharmacy.  They will contact our  office to request authorization. Prescriptions will not be filled after 5 pm or on week-ends. 4. Avoid getting constipated.  Between the surgery and the pain medications, it is common to experience some constipation.  Increasing fluid intake and taking a fiber supplement (such as Metamucil, Citrucel, FiberCon, MiraLax, etc) 1-2 times a day regularly will usually help prevent this problem from occurring.  A mild laxative (prune juice, Milk of Magnesia, MiraLax, etc) should be taken according to package directions if there are no bowel movements after 48 hours.   5. Wash / shower every day.  You may shower over the dressings as they are waterproof.  Continue to shower over incision(s) after the dressing is off. 6. Remove your waterproof bandages 5 days after surgery.  You may leave the incision open to air.  You may have skin tapes (Steri Strips) covering the incision(s).  Leave them on until one week, then remove.  You may replace a dressing/Band-Aid to cover the incision for comfort if you wish.      7. ACTIVITIES as tolerated:   a. You may resume regular (light) daily activities beginning the next day-such as daily self-care, walking, climbing stairs-gradually increasing activities as tolerated.  If you can walk 30 minutes without difficulty, it is safe to try more intense activity such as jogging, treadmill, bicycling, low-impact aerobics, swimming, etc. b. Save the most intensive and strenuous activity for last such as sit-ups, heavy lifting, contact sports, etc  Refrain from any heavy lifting or straining until you   are off narcotics for pain control.   c. DO NOT PUSH THROUGH PAIN.  Let pain be your guide: If it hurts to do something, don't do it.  Pain is your body warning you to avoid that activity for another week until the pain goes down. d. You may drive when you are no longer taking prescription pain medication, you can comfortably wear a seatbelt, and you can safely maneuver your car and apply  brakes. e. You may have sexual intercourse when it is comfortable.  8. FOLLOW UP in our office a. Please call [facility] at [phone] to set up an appointment to see your surgeon in the office for a follow-up appointment approximately 2-3 weeks after your surgery. b. Make sure that you call for this appointment the day you arrive home to insure a convenient appointment time. 9. IF YOU HAVE DISABILITY OR FAMILY LEAVE FORMS, BRING THEM TO THE OFFICE FOR PROCESSING.  DO NOT GIVE THEM TO YOUR DOCTOR.   WHEN TO CALL US [phone]: 1. Poor pain control 2. Reactions / problems with new medications (rash/itching, nausea, etc)  3. Fever over 101.5 F (38.5 C) 4. Worsening swelling or bruising 5. Continued bleeding from incision. 6. Increased pain, redness, or drainage from the incision   The clinic staff is available to answer your questions during regular business hours (8:30am-5pm).  Please don't hesitate to call and ask to speak to one of our nurses for clinical concerns.   If you have a medical emergency, go to the nearest emergency room or call 911.  A surgeon from [facility] is always on call at the hospitals   [facility] [address], [address], [place],   [address] ? MAIN: [phone] ? TOLL FREE: [phone] ?  FAX [phone] [email or web address]  

## 2011-12-03 NOTE — Progress Notes (Signed)
Patient ID: Matthew Savage, male   DOB: Sep 21, 1960, 51 y.o.   MRN: [id number]  The pathophysiology of skin & subcutaneous masses was discussed.  Natural history risks without surgery were discussed.  I recommended surgery to remove the mass.  I explained the technique of removal with use of local anesthesia & possible need for more aggressive sedation/anesthesia for patient comfort.    Risks such as bleeding, infection, heart attack, death, and other risks were discussed.  I noted a good likelihood this will help address the problem.   Possibility that this will not correct all symptoms was explained. Possibility of regrowth/recurrence of the mass was discussed.  We will work to minimize complications. Questions were answered.  The patient expresses understanding & wishes to proceed with surgery.  The pathophysiology of subcutaneous cysts & masses and differential diagnosis was discussed.  Natural history progression was discussed.  The patient's symptoms are not adequately controlled.  Non-operative treatment has not healed the abscess.  Therefore, I recommended incision & drainage of the abscess to allow the infection to resolve and heal.  Technique, risks, benefits, alternatives discussed.  The patient expressed understanding & wished to proceed.  I placed a field block with local anaesthetic around the three SQ cysts in the left lumber, Left gluteal, and right gluteal regions.  I incised the skin over the cysts & sharply removed the fibrotic cysts intact.  The lumbar cyst was 3.5 x 3 x 3 cm in size.  The left gluteal cyst was 2.5 x 2 x 2 cm size.  The right gluteal cyst was 3.5 x 3.5 x 3 cm in size.  I assured hemostasis and closed the wounds using 4-0 Monocryl absorbable sutures, Steri-Strips, sterile dressings.  Because these looked like classic cysts and the patient has had numerous prior excisions with benign results, I did not send the specimens to pathology.  The patient tolerated the procedure.  We  will have the patient return to clinic for close follow up to make sure the incisions heal.

## 2012-01-04 ENCOUNTER — Encounter (INDEPENDENT_AMBULATORY_CARE_PROVIDER_SITE_OTHER): Payer: BC Managed Care – PPO | Admitting: Surgery

## 2012-01-12 ENCOUNTER — Encounter (INDEPENDENT_AMBULATORY_CARE_PROVIDER_SITE_OTHER): Payer: BC Managed Care – PPO | Admitting: Surgery

## 2012-02-03 ENCOUNTER — Ambulatory Visit (INDEPENDENT_AMBULATORY_CARE_PROVIDER_SITE_OTHER): Payer: BC Managed Care – PPO | Admitting: Surgery

## 2012-02-03 ENCOUNTER — Encounter (INDEPENDENT_AMBULATORY_CARE_PROVIDER_SITE_OTHER): Payer: Self-pay | Admitting: Surgery

## 2012-02-03 VITALS — BP 126/84 | HR 68 | Temp 98.8°F | Resp 16 | Ht 72.0 in | Wt 251.0 lb

## 2012-02-03 DIAGNOSIS — L723 Sebaceous cyst: Secondary | ICD-10-CM

## 2012-02-03 DIAGNOSIS — L708 Other acne: Secondary | ICD-10-CM

## 2012-02-03 NOTE — Progress Notes (Signed)
Subjective:     Patient ID: Matthew Savage, male   DOB: Nov 06, 1960, 51 y.o.   MRN: [id number]  HPI  SIEGFRIED MERRIN  February 17, 1961 [id number]  Patient Care Team: Lindley Magnus, MD as PCP - General Beverley Fiedler, MD as Consulting Physician (Gastroenterology)  This patient is a 51 y.o.male who presents today for surgical evaluation Status post excision of sebaceous cyst from lower back and in her glutei.  Patient denies much pain.  Did fill a lump in the inner gluteal right cheek area.  Has had some drainage in the past but not now.  Rode his motorcycle for 100s of miles without much problems.  No pain or soreness.  Not bothersome as they were before surgery.  Overall is happy how he is recovering..     Patient Active Problem List  Diagnosis  . HYPERLIPIDEMIA  . GOUT  . OBESITY  . TOBACCO USE  . HYPERTENSION  . AORTIC STENOSIS  . ALLERGIC  RHINITIS  . DEGENERATIVE DISC DISEASE  . SLEEP APNEA  . Cervical radicular pain  . Sebaceous cysts of trunk (lumbar & gluteal) x3  . Chronic acne/blackheads of trunk, thighs, head, and neck    Past Medical History  Diagnosis Date  . Cancer     skin, squamous cell, face  . DDD (degenerative disc disease)   . Hyperlipidemia   . Hypertension   . Gout   . Numbness and tingling     right arm and hand  . Heart murmur   . Blood in urine     Past Surgical History  Procedure Date  . Laparoscopic gastric banding 03/2009    in [place], [place]  . Hernia repair 1967    X 2  . Mass excision 12/03/2011    X 2 (gluteal) X 1 (lower back)    History   Social History  . Marital Status: Divorced    Spouse Name: N/A    Number of Children: N/A  . Years of Education: N/A   Occupational History  . Not on file.   Social History Main Topics  . Smoking status: Current Every Day Smoker -- 2.0 packs/day for 36 years    Types: Cigarettes  . Smokeless tobacco: Never Used  . Alcohol Use: 6.0 oz/week    12 drink(s) per week     2 per day  . Drug Use:  No  . Sexually Active: Not on file   Other Topics Concern  . Not on file   Social History Narrative  . No narrative on file    Family History  Problem Relation Age of Onset  . Cancer Mother     carcinoma  . Breast cancer Mother   . Hypertension Father   . Colon polyps Father   . Colon polyps Brother     Current Outpatient Prescriptions  Medication Sig Dispense Refill  . ibuprofen (ADVIL,MOTRIN) 200 MG tablet Take 800 mg by mouth every 6 (six) hours as needed.        . Multiple Vitamin (MULTIVITAMIN) tablet Take 1 tablet by mouth daily.         Current Facility-Administered Medications  Medication Dose Route Frequency Provider Last Rate Last Dose  . DISCONTD: 0.9 %  sodium chloride infusion  500 mL Intravenous Continuous Beverley Fiedler, MD         No Known Allergies  BP 126/84  Pulse 68  Temp 98.8 F (37.1 C) (Temporal)  Resp 16  Ht 6' (1.829  m)  Wt 251 lb (113.853 kg)  BMI 34.04 kg/m2  No results found.   Review of Systems  Constitutional: Negative for fever, chills and diaphoresis.  HENT: Negative for sore throat, trouble swallowing and neck pain.   Eyes: Negative for photophobia and visual disturbance.  Respiratory: Negative for choking and shortness of breath.   Cardiovascular: Negative for chest pain and palpitations.  Gastrointestinal: Negative for nausea, vomiting, abdominal distention, anal bleeding and rectal pain.  Genitourinary: Negative for dysuria, urgency, difficulty urinating and testicular pain.  Musculoskeletal: Negative for myalgias, arthralgias and gait problem.  Skin: Negative for color change, rash and wound.  Neurological: Negative for dizziness, speech difficulty, weakness and numbness.  Hematological: Negative for adenopathy.  Psychiatric/Behavioral: Negative for hallucinations, confusion and agitation.       Objective:   Physical Exam  Constitutional: He is oriented to person, place, and time. He appears well-developed and  well-nourished. No distress.  HENT:  Head: Normocephalic.  Mouth/Throat: Oropharynx is clear and moist. No oropharyngeal exudate.  Eyes: Conjunctivae normal and EOM are normal. Pupils are equal, round, and reactive to light. No scleral icterus.  Neck: Normal range of motion. No tracheal deviation present.  Cardiovascular: Normal rate, normal heart sounds and intact distal pulses.   Pulmonary/Chest: Effort normal. No respiratory distress.  Abdominal: Soft. He exhibits no distension. There is no tenderness. Hernia confirmed negative in the right inguinal area and confirmed negative in the left inguinal area.       Incisions clean with normal healing ridges.  No hernias  Musculoskeletal: Normal range of motion. He exhibits no tenderness.  Neurological: He is alert and oriented to person, place, and time. No cranial nerve deficit. He exhibits normal muscle tone. Coordination normal.  Skin: Skin is warm and dry. No rash noted. He is not diaphoretic.       Incisions on the lower back and in her glutei well healed.  Small healing ridge on right inner gluteal mass.  No abscess or seroma.  No cellulitis.  No pain.  Psychiatric: He has a normal mood and affect. His behavior is normal.       Assessment:     Status post excision of benign cyst in a patient with numerous sebaceous cysts and chronic blackheads.    Plan:     Increase activity as tolerated.  Do not push through pain.  Return to clinic p.r.n. The patient expressed understanding and appreciation

## 2012-06-27 ENCOUNTER — Telehealth: Payer: Self-pay | Admitting: Internal Medicine

## 2012-06-27 NOTE — Telephone Encounter (Signed)
Patient Information:  Caller Name: Matthew Savage  Phone: [phone]  Patient: Matthew Savage,   Gender: Male  DOB: [birth year]  Age: 52 Years  PCP: Birdie Sons (Adults only)  Office Follow Up:  Does the office need to follow up with this patient?: No  Instructions For The Office: N/A   Symptoms  Reason For Call & Symptoms: Started with symptoms of soreness in L toe about a week ago and now L ankle is hurting- 06/27/12.  Outer L ankle is swollen, feels warm to touch, and he is limping. He has had 3-4 flair ups of Gout in last few months. Wanting to start daily medication to prevent flair ups after episode has cleared up.  Reviewed Health History In EMR: Yes  Reviewed Medications In EMR: Yes  Reviewed Allergies In EMR: Yes  Reviewed Surgeries / Procedures: Yes  Date of Onset of Symptoms: 06/20/2012  Treatments Tried: Ibuprofen 800mg s PO BID, Cherry Tart concentrate BID  Treatments Tried Worked: Yes  Guideline(s) Used:  Foot Pain  Ankle Pain  Disposition Per Guideline:   See Within 3 Days in Office  Reason For Disposition Reached:   Moderate pain (e.g., interferes with normal activities, limping) and present > 3 days  Advice Given:  Reassurance:  The symptoms you describe do not sound serious. You have told me that there is no redness,  or fever. You have also told me that there has been no recent major injury.  Ankle pain can be caused be mild arthritis and other minor problems.  Expected Course:   Pain from a mild strain or joint irritation usually get better within a week.  If this does not get better during the next week, you should make an appointment to see your doctor.  Call Back If:  Moderate pain (e.g. limping) lasts over 3 days  Mild pain lasts over 7 days  You become worse.  Appointment Scheduled:  06/29/2012 08:15:00 Appointment Scheduled Provider:  Eleonore Chiquito [facility])

## 2012-06-29 ENCOUNTER — Ambulatory Visit (INDEPENDENT_AMBULATORY_CARE_PROVIDER_SITE_OTHER): Payer: BC Managed Care – PPO | Admitting: Internal Medicine

## 2012-06-29 ENCOUNTER — Encounter: Payer: Self-pay | Admitting: Internal Medicine

## 2012-06-29 VITALS — BP 150/90 | HR 66 | Temp 98.8°F | Resp 18 | Wt 250.0 lb

## 2012-06-29 DIAGNOSIS — F17200 Nicotine dependence, unspecified, uncomplicated: Secondary | ICD-10-CM

## 2012-06-29 DIAGNOSIS — I10 Essential (primary) hypertension: Secondary | ICD-10-CM

## 2012-06-29 DIAGNOSIS — M109 Gout, unspecified: Secondary | ICD-10-CM

## 2012-06-29 LAB — URIC ACID: Uric Acid, Serum: 7.9 mg/dL — ABNORMAL HIGH (ref 4.0–7.8)

## 2012-06-29 MED ORDER — ALLOPURINOL 300 MG PO TABS: 300.0000 mg | ORAL_TABLET | Freq: Every day | ORAL | Status: DC

## 2012-06-29 MED ORDER — PREDNISONE 20 MG PO TABS: 20.0000 mg | ORAL_TABLET | Freq: Two times a day (BID) | ORAL | Status: DC

## 2012-06-29 NOTE — Progress Notes (Signed)
Subjective:    Patient ID: Matthew Savage, male    DOB: July 20, 1960, 52 y.o.   MRN: [id number]  HPI  52 year old patient who has a long history of recurring gout. In the past this has occurred once or twice per year but over the past year this has occurred approximately 6 times at the present time he has significant left lateral ankle pain and was past day or 2 some pain involving the left great toe. He has a history of hypertension but presently is off medication. Blood pressure medication had been discontinued and his weight was in the 215 range. What is now 250. He states that the recurrent gout has interfered with his activity. Father has a recurrent gout that has been very well controlled with allopurinol  Past Medical History  Diagnosis Date  . Cancer     skin, squamous cell, face  . DDD (degenerative disc disease)   . Hyperlipidemia   . Hypertension   . Gout   . Numbness and tingling     right arm and hand  . Heart murmur   . Blood in urine     History   Social History  . Marital Status: Divorced    Spouse Name: N/A    Number of Children: N/A  . Years of Education: N/A   Occupational History  . Not on file.   Social History Main Topics  . Smoking status: Current Every Day Smoker -- 2.00 packs/day for 36 years    Types: Cigarettes  . Smokeless tobacco: Never Used  . Alcohol Use: 6.0 oz/week    12 drink(s) per week     Comment: 2 per day  . Drug Use: No  . Sexually Active: Not on file   Other Topics Concern  . Not on file   Social History Narrative  . No narrative on file    Past Surgical History  Procedure Laterality Date  . Laparoscopic gastric banding  03/2009    in [place], [place]  . Hernia repair  1967    X 2  . Mass excision  12/03/2011    X 2 (gluteal) X 1 (lower back)    Family History  Problem Relation Age of Onset  . Cancer Mother     carcinoma  . Breast cancer Mother   . Hypertension Father   . Colon polyps Father   . Colon polyps Brother      No Known Allergies  Current Outpatient Prescriptions on File Prior to Visit  Medication Sig Dispense Refill  . ibuprofen (ADVIL,MOTRIN) 200 MG tablet Take 800 mg by mouth every 6 (six) hours as needed.        . Multiple Vitamin (MULTIVITAMIN) tablet Take 1 tablet by mouth daily.         No current facility-administered medications on file prior to visit.    BP 150/90  Pulse 66  Temp(Src) 98.8 F (37.1 C) (Oral)  Resp 18  Wt 250 lb (113.399 kg)  BMI 33.9 kg/m2  SpO2 98%      Review of Systems  Musculoskeletal: Positive for joint swelling and gait problem.       Objective:   Physical Exam  Constitutional: He appears well-developed and well-nourished. No distress.  Blood pressure 154/94  Musculoskeletal:  Left lateral ankle is slightly swollen and warm to touch. Tenderness involving the left lateral ankle as well as the left great toe          Assessment & Plan:   Recurrent  gout;  Lifestyle issues discussed; will place on allopurinol after acute episode  HTN-  Home bp monitoring Schedule CPX

## 2012-06-29 NOTE — Patient Instructions (Signed)
Limit your sodium (Salt) intake  Please check your blood pressure on a regular basis.  If it is consistently greater than 150/90, please make an office appointment.  You need to lose weight.  Consider a lower calorie diet and regular exercise.  Smoking tobacco is very bad for your health. You should stop smoking immediately.  DASH Diet The DASH diet stands for "Dietary Approaches to Stop Hypertension." It is a healthy eating plan that has been shown to reduce high blood pressure (hypertension) in as little as 14 days, while also possibly providing other significant health benefits. These other health benefits include reducing the risk of breast cancer after menopause and reducing the risk of type 2 diabetes, heart disease, colon cancer, and stroke. Health benefits also include weight loss and slowing kidney failure in patients with chronic kidney disease.  DIET GUIDELINES  Limit salt (sodium). Your diet should contain less than 1500 mg of sodium daily.  Limit refined or processed carbohydrates. Your diet should include mostly whole grains. Desserts and added sugars should be used sparingly.  Include small amounts of heart-healthy fats. These types of fats include nuts, oils, and tub margarine. Limit saturated and trans fats. These fats have been shown to be harmful in the body. CHOOSING FOODS  The following food groups are based on a 2000 calorie diet. See your Registered Dietitian for individual calorie needs. Grains and Grain Products (6 to 8 servings daily)  Eat More Often: Whole-wheat bread, brown rice, whole-grain or wheat pasta, quinoa, popcorn without added fat or salt (air popped).  Eat Less Often: White bread, white pasta, white rice, cornbread. Vegetables (4 to 5 servings daily)  Eat More Often: Fresh, frozen, and canned vegetables. Vegetables may be raw, steamed, roasted, or grilled with a minimal amount of fat.  Eat Less Often/Avoid: Creamed or fried vegetables. Vegetables in  a cheese sauce. Fruit (4 to 5 servings daily)  Eat More Often: All fresh, canned (in natural juice), or frozen fruits. Dried fruits without added sugar. One hundred percent fruit juice ( cup [237 mL] daily).  Eat Less Often: Dried fruits with added sugar. Canned fruit in light or heavy syrup. [employer], Fish, and Poultry (2 servings or less daily. One serving is 3 to 4 oz [85-114 g]).  Eat More Often: Ninety percent or leaner ground beef, tenderloin, sirloin. Round cuts of beef, chicken breast, [place] breast. All fish. Grill, bake, or broil your meat. Nothing should be fried.  Eat Less Often/Avoid: Fatty cuts of meat, [place], or chicken leg, thigh, or wing. Fried cuts of meat or fish. Dairy (2 to 3 servings)  Eat More Often: Low-fat or fat-free milk, low-fat plain or light yogurt, reduced-fat or part-skim cheese.  Eat Less Often/Avoid: Milk (whole, 2%).Whole milk yogurt. Full-fat cheeses. Nuts, Seeds, and Legumes (4 to 5 servings per week)  Eat More Often: All without added salt.  Eat Less Often/Avoid: Salted nuts and seeds, canned beans with added salt. Fats and Sweets (limited)  Eat More Often: Vegetable oils, tub margarines without trans fats, sugar-free gelatin. Mayonnaise and salad dressings.  Eat Less Often/Avoid: Coconut oils, palm oils, butter, stick margarine, cream, half and half, cookies, candy, pie. FOR MORE INFORMATION The Dash Diet Eating Plan: www.dashdiet.org Document Released: 03/26/2011 Document Revised: 06/29/2011 Document Reviewed: 03/26/2011 [facility] Patient Information 2013 [place], [place]. Gout Gout is an inflammatory condition (arthritis) caused by a buildup of uric acid crystals in the joints. Uric acid is a chemical that is normally present in the blood.  Under some circumstances, uric acid can form into crystals in your joints. This causes joint redness, soreness, and swelling (inflammation). Repeat attacks are common. Over time, uric acid crystals can form  into masses (tophi) near a joint, causing disfigurement. Gout is treatable and often preventable. CAUSES  The disease begins with elevated levels of uric acid in the blood. Uric acid is produced by your body when it breaks down a naturally found substance called purines. This also happens when you eat certain foods such as meats and fish. Causes of an elevated uric acid level include:  Being passed down from parent to child (heredity).  Diseases that cause increased uric acid production (obesity, psoriasis, some cancers).  Excessive alcohol use.  Diet, especially diets rich in meat and seafood.  Medicines, including certain cancer-fighting drugs (chemotherapy), diuretics, and aspirin.  Chronic kidney disease. The kidneys are no longer able to remove uric acid well.  Problems with metabolism. Conditions strongly associated with gout include:  Obesity.  High blood pressure.  High cholesterol.  Diabetes. Not everyone with elevated uric acid levels gets gout. It is not understood why some people get gout and others do not. Surgery, joint injury, and eating too much of certain foods are some of the factors that can lead to gout. SYMPTOMS   An attack of gout comes on quickly. It causes intense pain with redness, swelling, and warmth in a joint.  Fever can occur.  Often, only one joint is involved. Certain joints are more commonly involved:  Base of the big toe.  Knee.  Ankle.  Wrist.  Finger. Without treatment, an attack usually goes away in a few days to weeks. Between attacks, you usually will not have symptoms, which is different from many other forms of arthritis. DIAGNOSIS  Your caregiver will suspect gout based on your symptoms and exam. Removal of fluid from the joint (arthrocentesis) is done to check for uric acid crystals. Your caregiver will give you a medicine that numbs the area (local anesthetic) and use a needle to remove joint fluid for exam. Gout is confirmed  when uric acid crystals are seen in joint fluid, using a special microscope. Sometimes, blood, urine, and X-ray tests are also used. TREATMENT  There are 2 phases to gout treatment: treating the sudden onset (acute) attack and preventing attacks (prophylaxis). Treatment of an Acute Attack  Medicines are used. These include anti-inflammatory medicines or steroid medicines.  An injection of steroid medicine into the affected joint is sometimes necessary.  The painful joint is rested. Movement can worsen the arthritis.  You may use warm or cold treatments on painful joints, depending which works best for you.  Discuss the use of coffee, vitamin C, or cherries with your caregiver. These may be helpful treatment options. Treatment to Prevent Attacks After the acute attack subsides, your caregiver may advise prophylactic medicine. These medicines either help your kidneys eliminate uric acid from your body or decrease your uric acid production. You may need to stay on these medicines for a very long time. The early phase of treatment with prophylactic medicine can be associated with an increase in acute gout attacks. For this reason, during the first few months of treatment, your caregiver may also advise you to take medicines usually used for acute gout treatment. Be sure you understand your caregiver's directions. You should also discuss dietary treatment with your caregiver. Certain foods such as meats and fish can increase uric acid levels. Other foods such as dairy can decrease levels.  Your caregiver can give you a list of foods to avoid. HOME CARE INSTRUCTIONS   Do not take aspirin to relieve pain. This raises uric acid levels.  Only take over-the-counter or prescription medicines for pain, discomfort, or fever as directed by your caregiver.  Rest the joint as much as possible. When in bed, keep sheets and blankets off painful areas.  Keep the affected joint raised (elevated).  Use  crutches if the painful joint is in your leg.  Drink enough water and fluids to keep your urine clear or pale yellow. This helps your body get rid of uric acid. Do not drink alcoholic beverages. They slow the passage of uric acid.  Follow your caregiver's dietary instructions. Pay careful attention to the amount of protein you eat. Your daily diet should emphasize fruits, vegetables, whole grains, and fat-free or low-fat milk products.  Maintain a healthy body weight. SEEK MEDICAL CARE IF:   You have an oral temperature above 102 F (38.9 C).  You develop diarrhea, vomiting, or any side effects from medicines.  You do not feel better in 24 hours, or you are getting worse. SEEK IMMEDIATE MEDICAL CARE IF:   Your joint becomes suddenly more tender and you have:  Chills.  An oral temperature above 102 F (38.9 C), not controlled by medicine. MAKE SURE YOU:   Understand these instructions.  Will watch your condition.  Will get help right away if you are not doing well or get worse. Document Released: 04/03/2000 Document Revised: 06/29/2011 Document Reviewed: 07/15/2009 [facility] Patient Information 2013 [place], [place]. Purine Restricted Diet A low-purine diet consists of foods that reduce uric acid made in your body. INDICATIONS FOR USE  Your caregiver may ask you to follow a low-purine diet to reduce gout flairs.  GUIDELINES  Avoid high-purine foods, including all alcohol, yeast extracts taken as supplements, and sauces made from meats (like gravy). Do not eat high-purine meats, including anchovies, sardines, herring, mussels, tuna, codfish, scallops, trout, haddock, bacon, organ meats, tripe, goose, wild game, and sweetbreads.  Grains  Allowed/Recommended: All, except those listed to consume in moderation.  Consume in Moderation: Oatmeal ( cup uncooked daily), wheat bran or germ ( cup daily), and whole grains. Vegetables  Allowed/Recommended: All, except those listed to  consume in moderation.  Consume in Moderation: Asparagus, cauliflower, spinach, mushrooms, and green peas ( cup daily). Fruit  Allowed/Recommended: All.  Consume in Moderation: None. Meat and Meat Substitutes  Allowed/Recommended: Eggs, nuts, and peanut butter.  Consume in Moderation: Limit to 4 to 6 oz daily. Avoid high-purine meats. Lentils, peas, and dried beans (1 cup daily). Milk  Allowed/Recommended: All. Choose low-fat or skim when possible.  Consume in Moderation: None. Fats and Oils  Allowed/Recommended: All.  Consume in Moderation: None. Beverages  Allowed/Recommended: All, except those listed to avoid.  Avoid: All alcohol. Condiments/Miscellaneous  Allowed/Recommended: All, except those listed to consume in moderation.  Consume in Moderation: Bouillon and meat-based broths and soups. Document Released: 08/01/2010 Document Revised: 06/29/2011 Document Reviewed: 08/01/2010 [facility] Patient Information 2013 [place], [place].

## 2015-04-21 DIAGNOSIS — I82409 Acute embolism and thrombosis of unspecified deep veins of unspecified lower extremity: Secondary | ICD-10-CM

## 2015-04-21 HISTORY — DX: Acute embolism and thrombosis of unspecified deep veins of unspecified lower extremity: I82.409

## 2016-01-14 ENCOUNTER — Ambulatory Visit (INDEPENDENT_AMBULATORY_CARE_PROVIDER_SITE_OTHER): Payer: BLUE CROSS/BLUE SHIELD | Admitting: Adult Health

## 2016-01-14 ENCOUNTER — Encounter: Payer: Self-pay | Admitting: Adult Health

## 2016-01-14 VITALS — BP 160/92 | Temp 98.6°F | Ht 72.0 in | Wt 264.9 lb

## 2016-01-14 DIAGNOSIS — I10 Essential (primary) hypertension: Secondary | ICD-10-CM

## 2016-01-14 DIAGNOSIS — Z7189 Other specified counseling: Secondary | ICD-10-CM

## 2016-01-14 DIAGNOSIS — F17200 Nicotine dependence, unspecified, uncomplicated: Secondary | ICD-10-CM

## 2016-01-14 DIAGNOSIS — N5089 Other specified disorders of the male genital organs: Secondary | ICD-10-CM | POA: Insufficient documentation

## 2016-01-14 DIAGNOSIS — Z7689 Persons encountering health services in other specified circumstances: Secondary | ICD-10-CM

## 2016-01-14 DIAGNOSIS — E669 Obesity, unspecified: Secondary | ICD-10-CM

## 2016-01-14 MED ORDER — LISINOPRIL 20 MG PO TABS: 20.0000 mg | ORAL_TABLET | Freq: Every day | ORAL | 3 refills | Status: DC

## 2016-01-14 NOTE — Progress Notes (Signed)
Patient presents to clinic today to establish care. He is a pleasant 55 year old male who  has a past medical history of Blood in urine; Cancer ([place]); DDD (degenerative disc disease); DVT (deep vein thrombosis) in pregnancy (2017); Gout; Heart murmur; Hyperlipidemia; Hypertension; and Numbness and tingling.  He has not had a physical in " many years"   Acute Concerns: Establish Care  DVT in left leg - Was discovered 20 days ago at urgent care. He is currently on 15mg  Xeralto and switches over to 20 mg once a day. He is going to follow up with [facility] center for repeat ultrasound   Chronic Issues: Hypertension  - He is currently on 5 mg on Lisinopril and takes that every day.  He reports that his blood pressures have been elevated recently - in the 140-150's.   Lap band-  - He had a lamp band done in 2010 [facility] in [place], [place]) , since that time the organization that had the surgery done has gone out of business. He would like to see someone for a follow up. Denies any problems with lap band at this time.   Tobacco Use - he continues to smoke. He does not want to quit.   Right testicle swelling - He had hernia surgery about a year ago and since that time his right testicle has been swollen. He denies any pain or bruising. Has not had any trauma to the area.   Health Maintenance: Dental -- Routine Care Vision -- Routine Care  Immunizations -- UTD  Colonoscopy -- 2013 - 5 years     Past Medical History:  Diagnosis Date  . Blood in urine   . Cancer (H[facility])    skin, squamous cell, face  . DDD (degenerative disc disease)   . DVT (deep vein thrombosis) in pregnancy 2017  . Gout   . Heart murmur   . Hyperlipidemia   . Hypertension   . Numbness and tingling    right arm and hand    Past Surgical History:  Procedure Laterality Date  . HERNIA REPAIR  1967   X 2  . LAPAROSCOPIC GASTRIC BANDING  03/2009   in [place], [place]  . MASS EXCISION  12/03/2011   X 2  (gluteal) X 1 (lower back)    Current Outpatient Prescriptions on File Prior to Visit  Medication Sig Dispense Refill  . Multiple Vitamin (MULTIVITAMIN) tablet Take 1 tablet by mouth daily.      . predniSONE (DELTASONE) 20 MG tablet Take 1 tablet (20 mg total) by mouth 2 (two) times daily. 14 tablet 1  . ibuprofen (ADVIL,MOTRIN) 200 MG tablet Take 800 mg by mouth every 6 (six) hours as needed.       No current facility-administered medications on file prior to visit.     No Known Allergies  Family History  Problem Relation Age of Onset  . Cancer Mother     carcinoma  . Breast cancer Mother   . Hypertension Father   . Colon polyps Father   . Colon polyps Brother     Social History   Social History  . Marital status: Divorced    Spouse name: N/A  . Number of children: N/A  . Years of education: N/A   Occupational History  . Not on file.   Social History Main Topics  . Smoking status: Current Every Day Smoker    Packs/day: 2.00    Years: 36.00    Types: Cigarettes  .  Smokeless tobacco: Never Used  . Alcohol use 6.0 oz/week    12 drink(s) per week     Comment: 2 per day  . Drug use: No  . Sexual activity: Not on file   Other Topics Concern  . 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    Married    No kids    Review of Systems  Constitutional: Negative.   HENT: Negative.   Eyes: Negative.   Respiratory: Negative.   Cardiovascular: Negative.   Gastrointestinal: Negative.   Genitourinary: Negative.   Musculoskeletal: Positive for joint pain (chronic ).  Skin: Negative.   Neurological: Negative.   Endo/Heme/Allergies: Negative.   Psychiatric/Behavioral: Negative.   All other systems reviewed and are negative.   BP (!) 160/92   Temp 98.6 F (37 C) (Oral)   Ht 6' (1.829 m)   Wt 264 lb 14.4 oz (120.2 kg)   BMI 35.93 kg/m   Physical Exam  Constitutional: He is oriented to person, place, and time and  well-developed, well-nourished, and in no distress. No distress.  Obese around abdomen   HENT:  Head: Normocephalic and atraumatic.  Right Ear: External ear normal.  Left Ear: External ear normal.  Nose: Nose normal.  Mouth/Throat: Oropharynx is clear and moist. No oropharyngeal exudate.  Eyes: Conjunctivae and EOM are normal. Pupils are equal, round, and reactive to light. Right eye exhibits no discharge. Left eye exhibits no discharge. No scleral icterus.  Neck: Normal range of motion. Neck supple. No thyromegaly present.  Cardiovascular: Normal rate, regular rhythm, normal heart sounds and intact distal pulses.  Exam reveals no gallop and no friction rub.   No murmur heard. Pulmonary/Chest: Effort normal and breath sounds normal. No respiratory distress. He has no wheezes. He has no rales. He exhibits no tenderness.  Genitourinary: Penis normal. He exhibits abnormal scrotal mass. He exhibits no abnormal testicular mass, no testicular tenderness and no scrotal tenderness.  Genitourinary Comments: Right testicle swelling   Musculoskeletal: Normal range of motion. He exhibits no edema, tenderness or deformity.  Lymphadenopathy:    He has no cervical adenopathy.  Neurological: He is alert and oriented to person, place, and time. He has normal reflexes. He displays normal reflexes. No cranial nerve deficit. He exhibits normal muscle tone. Gait normal. Coordination normal. GCS score is 15.  Skin: Skin is warm and dry. No rash noted. He is not diaphoretic. No erythema. No pallor.  Psychiatric: Mood, memory, affect and judgment normal.  Nursing note and vitals reviewed.  Assessment/Plan:  1. Encounter to establish care - Follow up for CPE - Follow up sooner if needed - Educated on the importance of heart healthy diet and frequent exercise.   2. Essential hypertension - Increase lisinopril from 5 mg to 20 mg - Monitor BP at home. Return precautions given - Will follow up with at physical    - lisinopril (PRINIVIL,ZESTRIL) 20 MG tablet; Take 1 tablet (20 mg total) by mouth daily.  Dispense: 90 tablet; Refill: 3  3. TOBACCO USE - Does not want to quit at this time.  - Advised that if he every wants to quit he can follow up and I will help him   4. OBESITY - Will call [facility] and see if they will see him   5. Swelling of right testicle - Common after hernia surgery but I would not expect it to be swollen a year after. Possible variocele or  hydrocele.  - US Scrotum; Future - [place] Art/Ven Flow Abd Pelv Doppler; Future  Dorothyann Peng, NP

## 2016-01-14 NOTE — Patient Instructions (Addendum)
It was great meeting you today!  Please schedule your physical   Someone will call you to schedule your ultrasound  I have increased our lisinopril from  20 mg to 5 mg  Health Maintenance, Male A healthy lifestyle and preventative care can promote health and wellness.  Maintain regular health, dental, and eye exams.  Eat a healthy diet. Foods like vegetables, fruits, whole grains, low-fat dairy products, and lean protein foods contain the nutrients you need and are low in calories. Decrease your intake of foods high in solid fats, added sugars, and salt. Get information about a proper diet from your health care provider, if necessary.  Regular physical exercise is one of the most important things you can do for your health. Most adults should get at least 150 minutes of moderate-intensity exercise (any activity that increases your heart rate and causes you to sweat) each week. In addition, most adults need muscle-strengthening exercises on 2 or more days a week.   Maintain a healthy weight. The body mass index (BMI) is a screening tool to identify possible weight problems. It provides an estimate of body fat based on height and weight. Your health care provider can find your BMI and can help you achieve or maintain a healthy weight. For males 20 years and older:  A BMI below 18.5 is considered underweight.  A BMI of 18.5 to 24.9 is normal.  A BMI of 25 to 29.9 is considered overweight.  A BMI of 30 and above is considered obese.  Maintain normal blood lipids and cholesterol by exercising and minimizing your intake of saturated fat. Eat a balanced diet with plenty of fruits and vegetables. Blood tests for lipids and cholesterol should begin at age 33 and be repeated every 5 years. If your lipid or cholesterol levels are high, you are over age 42, or you are at high risk for heart disease, you may need your cholesterol levels checked more frequently.Ongoing high lipid and cholesterol  levels should be treated with medicines if diet and exercise are not working.  If you smoke, find out from your health care provider how to quit. If you do not use tobacco, do not start.  Lung cancer screening is recommended for adults aged 37-80 years who are at high risk for developing lung cancer because of a history of smoking. A yearly low-dose CT scan of the lungs is recommended for people who have at least a 30-pack-year history of smoking and are current smokers or have quit within the past 15 years. A pack year of smoking is smoking an average of 1 pack of cigarettes a day for 1 year (for example, a 30-pack-year history of smoking could mean smoking 1 pack a day for 30 years or 2 packs a day for 15 years). Yearly screening should continue until the smoker has stopped smoking for at least 15 years. Yearly screening should be stopped for people who develop a health problem that would prevent them from having lung cancer treatment.  If you choose to drink alcohol, do not have more than 2 drinks per day. One drink is considered to be 12 oz (360 mL) of beer, 5 oz (150 mL) of wine, or 1.5 oz (45 mL) of liquor.  Avoid the use of street drugs. Do not share needles with anyone. Ask for help if you need support or instructions about stopping the use of drugs.  High blood pressure causes heart disease and increases the risk of stroke. High blood pressure is  more likely to develop in:  People who have blood pressure in the end of the normal range (100-139/85-89 mm Hg).  People who are overweight or obese.  People who are African American.  If you are 52-41 years of age, have your blood pressure checked every 3-5 years. If you are 1 years of age or older, have your blood pressure checked every year. You should have your blood pressure measured twice--once when you are at a hospital or clinic, and once when you are not at a hospital or clinic. Record the average of the two measurements. To check your  blood pressure when you are not at a hospital or clinic, you can use:  An automated blood pressure machine at a pharmacy.  A home blood pressure monitor.  If you are 64-58 years old, ask your health care provider if you should take aspirin to prevent heart disease.  Diabetes screening involves taking a blood sample to check your fasting blood sugar level. This should be done once every 3 years after age 58 if you are at a normal weight and without risk factors for diabetes. Testing should be considered at a younger age or be carried out more frequently if you are overweight and have at least 1 risk factor for diabetes.  Colorectal cancer can be detected and often prevented. Most routine colorectal cancer screening begins at the age of 20 and continues through age 38. However, your health care provider may recommend screening at an earlier age if you have risk factors for colon cancer. On a yearly basis, your health care provider may provide home test kits to check for hidden blood in the stool. A small camera at the end of a tube may be used to directly examine the colon (sigmoidoscopy or colonoscopy) to detect the earliest forms of colorectal cancer. Talk to your health care provider about this at age 72 when routine screening begins. A direct exam of the colon should be repeated every 5-10 years through age 66, unless early forms of precancerous polyps or small growths are found.  People who are at an increased risk for hepatitis B should be screened for this virus. You are considered at high risk for hepatitis B if:  You were born in a country where hepatitis B occurs often. Talk with your health care provider about which countries are considered high risk.  Your parents were born in a high-risk country and you have not received a shot to protect against hepatitis B (hepatitis B vaccine).  You have HIV or AIDS.  You use needles to inject street drugs.  You live with, or have sex with,  someone who has hepatitis B.  You are a man who has sex with other men (MSM).  You get hemodialysis treatment.  You take certain medicines for conditions like cancer, organ transplantation, and autoimmune conditions.  Hepatitis C blood testing is recommended for all people born from [birth year] through 1965 and any individual with known risk factors for hepatitis C.  Healthy men should no longer receive prostate-specific antigen (PSA) blood tests as part of routine cancer screening. Talk to your health care provider about prostate cancer screening.  Testicular cancer screening is not recommended for adolescents or adult males who have no symptoms. Screening includes self-exam, a health care provider exam, and other screening tests. Consult with your health care provider about any symptoms you have or any concerns you have about testicular cancer.  Practice safe sex. Use condoms and avoid high-risk sexual  practices to reduce the spread of sexually transmitted infections (STIs).  You should be screened for STIs, including gonorrhea and chlamydia if:  You are sexually active and are younger than 24 years.  You are older than 24 years, and your health care provider tells you that you are at risk for this type of infection.  Your sexual activity has changed since you were last screened, and you are at an increased risk for chlamydia or gonorrhea. Ask your health care provider if you are at risk.  If you are at risk of being infected with HIV, it is recommended that you take a prescription medicine daily to prevent HIV infection. This is called pre-exposure prophylaxis (PrEP). You are considered at risk if:  You are a man who has sex with other men (MSM).  You are a heterosexual man who is sexually active with multiple partners.  You take drugs by injection.  You are sexually active with a partner who has HIV.  Talk with your health care provider about whether you are at high risk of being  infected with HIV. If you choose to begin PrEP, you should first be tested for HIV. You should then be tested every 3 months for as long as you are taking PrEP.  Use sunscreen. Apply sunscreen liberally and repeatedly throughout the day. You should seek shade when your shadow is shorter than you. Protect yourself by wearing long sleeves, pants, a wide-brimmed hat, and sunglasses year round whenever you are outdoors.  Tell your health care provider of new moles or changes in moles, especially if there is a change in shape or color. Also, tell your health care provider if a mole is larger than the size of a pencil eraser.  A one-time screening for abdominal aortic aneurysm (AAA) and surgical repair of large AAAs by ultrasound is recommended for men aged 68-75 years who are current or former smokers.  Stay current with your vaccines (immunizations).   This information is not intended to replace advice given to you by your health care provider. Make sure you discuss any questions you have with your health care provider.   Document Released: 10/03/2007 Document Revised: 04/27/2014 Document Reviewed: 09/01/2010 Elsevier Interactive Patient Education [employer].

## 2016-01-28 ENCOUNTER — Ambulatory Visit
Admission: RE | Admit: 2016-01-28 | Discharge: 2016-01-28 | Disposition: A | Discharge status: Home / Self Care | Payer: BLUE CROSS/BLUE SHIELD | Source: Ambulatory Visit | Attending: Adult Health | Admitting: Adult Health

## 2016-01-28 ENCOUNTER — Other Ambulatory Visit: Payer: BLUE CROSS/BLUE SHIELD

## 2016-01-28 ENCOUNTER — Other Ambulatory Visit (INDEPENDENT_AMBULATORY_CARE_PROVIDER_SITE_OTHER): Payer: BLUE CROSS/BLUE SHIELD

## 2016-01-28 DIAGNOSIS — N5089 Other specified disorders of the male genital organs: Secondary | ICD-10-CM

## 2016-01-28 DIAGNOSIS — R319 Hematuria, unspecified: Secondary | ICD-10-CM | POA: Diagnosis not present

## 2016-01-28 DIAGNOSIS — Z0000 Encounter for general adult medical examination without abnormal findings: Secondary | ICD-10-CM

## 2016-01-28 LAB — HEPATIC FUNCTION PANEL
ALT: 27 U/L (ref 0–53)
AST: 23 U/L (ref 0–37)
Albumin: 3.5 g/dL (ref 3.5–5.2)
Alkaline Phosphatase: 55 U/L (ref 39–117)
Bilirubin, Direct: 0.1 mg/dL (ref 0.0–0.3)
Total Bilirubin: 0.7 mg/dL (ref 0.2–1.2)
Total Protein: 6 g/dL (ref 6.0–8.3)

## 2016-01-28 LAB — BASIC METABOLIC PANEL
BUN: 10 mg/dL (ref 6–23)
CO2: 29 mEq/L (ref 19–32)
Calcium: 8.8 mg/dL (ref 8.4–10.5)
Chloride: 104 mEq/L (ref 96–112)
Creatinine, Ser: 0.84 mg/dL (ref 0.40–1.50)
GFR: 100.66 mL/min (ref >60.00)
Glucose, Bld: 93 mg/dL (ref 70–99)
Potassium: 4.5 mEq/L (ref 3.5–5.1)
Sodium: 139 mEq/L (ref 135–145)

## 2016-01-28 LAB — POC URINALSYSI DIPSTICK (AUTOMATED)
Bilirubin, UA: NEGATIVE
Glucose, UA: NEGATIVE
Ketones, UA: NEGATIVE
Leukocytes, UA: NEGATIVE
Nitrite, UA: NEGATIVE
Spec Grav, UA: 1.02
Urobilinogen, UA: 1
pH, UA: 7.5

## 2016-01-28 LAB — TSH: TSH: 1.52 u[IU]/mL (ref 0.35–4.50)

## 2016-01-28 LAB — LIPID PANEL
Cholesterol: 211 mg/dL — ABNORMAL HIGH (ref 0–200)
HDL: 64.8 mg/dL (ref >39.00)
LDL Cholesterol: 130 mg/dL — ABNORMAL HIGH (ref 0–99)
NonHDL: 145.74
Total CHOL/HDL Ratio: 3
Triglycerides: 81 mg/dL (ref 0.0–149.0)
VLDL: 16.2 mg/dL (ref 0.0–40.0)

## 2016-01-28 LAB — URINALYSIS, MICROSCOPIC ONLY

## 2016-01-28 LAB — CBC WITH DIFFERENTIAL/PLATELET
Basophils Absolute: 0 10*3/uL (ref 0.0–0.1)
Basophils Relative: 0.4 % (ref 0.0–3.0)
Eosinophils Absolute: 0.2 10*3/uL (ref 0.0–0.7)
Eosinophils Relative: 2.8 % (ref 0.0–5.0)
HCT: 41.8 % (ref 39.0–52.0)
Hemoglobin: 14.2 g/dL (ref 13.0–17.0)
Lymphocytes Relative: 22 % (ref 12.0–46.0)
Lymphs Abs: 1.5 10*3/uL (ref 0.7–4.0)
MCHC: 33.9 g/dL (ref 30.0–36.0)
MCV: 98.5 fl (ref 78.0–100.0)
Monocytes Absolute: 0.5 10*3/uL (ref 0.1–1.0)
Monocytes Relative: 7.2 % (ref 3.0–12.0)
Neutro Abs: 4.5 10*3/uL (ref 1.4–7.7)
Neutrophils Relative %: 67.6 % (ref 43.0–77.0)
Platelets: 274 10*3/uL (ref 150.0–400.0)
RBC: 4.24 Mil/uL (ref 4.22–5.81)
RDW: 14.6 % (ref 11.5–15.5)
WBC: 6.7 10*3/uL (ref 4.0–10.5)

## 2016-01-28 LAB — PSA: PSA: 0.45 ng/mL (ref 0.10–4.00)

## 2016-02-12 ENCOUNTER — Encounter: Payer: Self-pay | Admitting: Adult Health

## 2016-02-12 ENCOUNTER — Other Ambulatory Visit: Payer: Self-pay | Admitting: Adult Health

## 2016-02-12 ENCOUNTER — Ambulatory Visit (INDEPENDENT_AMBULATORY_CARE_PROVIDER_SITE_OTHER): Payer: BLUE CROSS/BLUE SHIELD | Admitting: Adult Health

## 2016-02-12 VITALS — BP 132/84 | Temp 98.6°F | Ht 72.0 in | Wt 267.2 lb

## 2016-02-12 DIAGNOSIS — E785 Hyperlipidemia, unspecified: Secondary | ICD-10-CM | POA: Diagnosis not present

## 2016-02-12 DIAGNOSIS — I10 Essential (primary) hypertension: Secondary | ICD-10-CM | POA: Diagnosis not present

## 2016-02-12 DIAGNOSIS — Z0000 Encounter for general adult medical examination without abnormal findings: Secondary | ICD-10-CM | POA: Diagnosis not present

## 2016-02-12 DIAGNOSIS — F17200 Nicotine dependence, unspecified, uncomplicated: Secondary | ICD-10-CM | POA: Diagnosis not present

## 2016-02-12 DIAGNOSIS — E669 Obesity, unspecified: Secondary | ICD-10-CM

## 2016-02-12 DIAGNOSIS — Z9884 Bariatric surgery status: Secondary | ICD-10-CM

## 2016-02-12 NOTE — Progress Notes (Signed)
Subjective:    Patient ID: Matthew Savage, male    DOB: [birth year], 55 y.o.   MRN: [id number]  HPI  Patient presents for yearly preventative medicine examination. He is a pleasant 55 year old male who  has a past medical history of Blood in urine; DDD (degenerative disc disease); DVT (deep vein thrombosis) in pregnancy ([place]) (2017); Gout; Heart murmur; Hyperlipidemia; Hypertension; Numbness and tingling; and SCC (squamous cell carcinoma).  All immunizations and health maintenance protocols were reviewed with the patient and needed orders were placed. He refused his flu shot today. He understands his risks.   Medication reconciliation,  past medical history, social history, problem list and allergies were reviewed in detail with the patient  Goals were established with regard to weight loss, exercise, and  diet in compliance with medications. He is not exercising outside of work but tries to eat healthy  End of life planning was discussed.  He continues to smoke and does not want to quit  His lisinopril was increased during the last visit form 5 mg to 20 mg. His blood pressure is much better controlled.    Review of Systems  Constitutional: Negative.   HENT: Negative.   Eyes: Negative.   Respiratory: Negative.   Cardiovascular: Negative.   Gastrointestinal: Negative.   Endocrine: Negative.   Genitourinary: Negative.   Musculoskeletal: Negative.   Skin: Negative.   Allergic/Immunologic: Negative.   Neurological: Negative.   Hematological: Negative.   Psychiatric/Behavioral: Negative.   All other systems reviewed and are negative.  Past Medical History:  Diagnosis Date  . Blood in urine   . DDD (degenerative disc disease)   . DVT (deep vein thrombosis) in pregnancy ([place]) 2017  . Gout   . Heart murmur   . Hyperlipidemia   . Hypertension   . Numbness and tingling    right arm and hand  . SCC (squamous cell carcinoma)    skin, squamous cell, face    Social History    Social History  . Marital status: Divorced    Spouse name: N/A  . Number of children: N/A  . Years of education: N/A   Occupational History  . Not on file.   Social History Main Topics  . Smoking status: Current Every Day Smoker    Packs/day: 2.00    Years: 36.00    Types: Cigarettes  . Smokeless tobacco: Never Used  . Alcohol use 6.0 oz/week    12 drink(s) per week     Comment: 2 per day  . Drug use: No  . Sexual activity: Not on file   Other Topics Concern  . 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    Married    No kids    Past Surgical History:  Procedure Laterality Date  . HERNIA REPAIR  1967   X 2  . LAPAROSCOPIC GASTRIC BANDING  03/2009   in [place], [place]  . MASS EXCISION  12/03/2011   X 2 (gluteal) X 1 (lower back)    Family History  Problem Relation Age of Onset  . Cancer Mother     carcinoma  . Breast cancer Mother   . Hypertension Father   . Colon polyps Father   . Colon polyps Brother     No Known Allergies  Current Outpatient Prescriptions on File Prior to Visit  Medication Sig Dispense Refill  . ibuprofen (ADVIL,MOTRIN) 200 MG tablet Take 800 mg by  mouth every 6 (six) hours as needed.      Marland Kitchen lisinopril (PRINIVIL,ZESTRIL) 20 MG tablet Take 1 tablet (20 mg total) by mouth daily. 90 tablet 3  . loratadine (CLARITIN) 10 MG tablet Take 10 mg by mouth daily.    . Multiple Vitamin (MULTIVITAMIN) tablet Take 1 tablet by mouth daily.      . rivaroxaban (XARELTO) 20 MG TABS tablet Take 20 mg by mouth daily with supper.     No current facility-administered medications on file prior to visit.     BP 132/84   Temp 98.6 F (37 C) (Oral)   Ht 6' (1.829 m)   Wt 267 lb 3.2 oz (121.2 kg)   BMI 36.24 kg/m       Objective:   Physical Exam  Constitutional: He is oriented to person, place, and time. He appears well-developed and well-nourished. No distress.  obese  HENT:  Head: Normocephalic and  atraumatic.  Right Ear: External ear normal.  Left Ear: External ear normal.  Nose: Nose normal.  Mouth/Throat: Oropharynx is clear and moist. No oropharyngeal exudate.  Eyes: Conjunctivae are normal. Pupils are equal, round, and reactive to light. Right eye exhibits no discharge. Left eye exhibits no discharge. No scleral icterus.  Neck: Normal range of motion. Neck supple. No JVD present. Carotid bruit is not present. No tracheal deviation present. No thyroid mass and no thyromegaly present.  Cardiovascular: Normal rate, regular rhythm, normal heart sounds and intact distal pulses.  Exam reveals no gallop and no friction rub.   No murmur heard. Pulmonary/Chest: Effort normal and breath sounds normal. No stridor. No respiratory distress. He has no wheezes. He has no rales. He exhibits no tenderness.  Abdominal: Soft. Normal appearance, normal aorta and bowel sounds are normal. He exhibits no distension and no mass. There is no tenderness. There is no rebound and no guarding.  Genitourinary: Rectum normal and prostate normal. Rectal exam shows guaiac negative stool.  Musculoskeletal: Normal range of motion. He exhibits no edema, tenderness or deformity.  Lymphadenopathy:    He has no cervical adenopathy.  Neurological: He is alert and oriented to person, place, and time. He has normal reflexes. He displays normal reflexes. No cranial nerve deficit. He exhibits normal muscle tone. Coordination normal.  Skin: Skin is warm and dry. No rash noted. He is not diaphoretic. No erythema. No pallor.  Psychiatric: He has a normal mood and affect. His behavior is normal. Judgment and thought content normal.  Nursing note and vitals reviewed.     Assessment & Plan:  1. Routine general medical examination at a health care facility - Reviewed labs in detail with patient. And all questions answered - I would like him to quit smoking and start exercising - Follow up in one year or sooner if neede  2.  Essential hypertension - Near goal at this time  - Continue with current dose - Will continue to monitor   3. Hyperlipidemia, unspecified hyperlipidemia type - 10 year cardiac risk was 5.5 %  - Work on diet and exercise   4. TOBACCO USE - Does not want to quit - Advised to inform me if he would like to quit  5. Obesity, unspecified classification, unspecified obesity type, unspecified whether serious comorbidity present - Will refer to [facility] to follow up with on Lap Band   Dorothyann Peng, NP

## 2016-03-10 ENCOUNTER — Encounter: Payer: Self-pay | Admitting: Internal Medicine

## 2016-03-26 DIAGNOSIS — Z9884 Bariatric surgery status: Secondary | ICD-10-CM | POA: Insufficient documentation

## 2016-03-26 DIAGNOSIS — Z7901 Long term (current) use of anticoagulants: Secondary | ICD-10-CM | POA: Insufficient documentation

## 2016-03-26 DIAGNOSIS — I825Z2 Chronic embolism and thrombosis of unspecified deep veins of left distal lower extremity: Secondary | ICD-10-CM | POA: Insufficient documentation

## 2016-12-24 LAB — CBC AND DIFFERENTIAL
HCT: 41 (ref 41–53)
Hemoglobin: 13.3 — AB (ref 13.5–17.5)
Platelets: 317 (ref 150–399)
WBC: 8.6

## 2016-12-24 LAB — BASIC METABOLIC PANEL
BUN: 10 (ref 4–21)
Creatinine: 1 (ref 0.6–1.3)
Glucose: 100
Potassium: 4.2 (ref 3.4–5.3)
Sodium: 138 (ref 137–147)

## 2016-12-24 LAB — HEPATIC FUNCTION PANEL
ALT: 20 (ref 10–40)
AST: 18 (ref 14–40)
Alkaline Phosphatase: 64 (ref 25–125)
Bilirubin, Total: 0.4

## 2017-01-29 ENCOUNTER — Other Ambulatory Visit: Payer: Self-pay | Admitting: Adult Health

## 2017-01-29 DIAGNOSIS — I10 Essential (primary) hypertension: Secondary | ICD-10-CM

## 2017-02-02 NOTE — Telephone Encounter (Signed)
Left a message for a return call.  Pt needs to get on the schedule on or after 02/11/17 for cpx.

## 2017-02-02 NOTE — Telephone Encounter (Signed)
Patient called stating he is returning Misty's call.

## 2017-02-02 NOTE — Telephone Encounter (Signed)
Pt scheduled for cpx on 03/24/17.  Medication sent to the pharmacy by e-scribe for 90 days.

## 2017-03-24 ENCOUNTER — Encounter: Payer: Self-pay | Admitting: Physician Assistant

## 2017-03-24 ENCOUNTER — Ambulatory Visit (INDEPENDENT_AMBULATORY_CARE_PROVIDER_SITE_OTHER): Payer: BLUE CROSS/BLUE SHIELD | Admitting: Adult Health

## 2017-03-24 ENCOUNTER — Encounter: Payer: Self-pay | Admitting: Adult Health

## 2017-03-24 VITALS — BP 160/90 | Temp 98.3°F | Ht 71.5 in | Wt 271.0 lb

## 2017-03-24 DIAGNOSIS — Z0000 Encounter for general adult medical examination without abnormal findings: Secondary | ICD-10-CM | POA: Diagnosis not present

## 2017-03-24 DIAGNOSIS — E669 Obesity, unspecified: Secondary | ICD-10-CM

## 2017-03-24 DIAGNOSIS — Z1159 Encounter for screening for other viral diseases: Secondary | ICD-10-CM

## 2017-03-24 DIAGNOSIS — F17200 Nicotine dependence, unspecified, uncomplicated: Secondary | ICD-10-CM | POA: Diagnosis not present

## 2017-03-24 DIAGNOSIS — E785 Hyperlipidemia, unspecified: Secondary | ICD-10-CM | POA: Diagnosis not present

## 2017-03-24 DIAGNOSIS — Z1211 Encounter for screening for malignant neoplasm of colon: Secondary | ICD-10-CM | POA: Diagnosis not present

## 2017-03-24 DIAGNOSIS — Z125 Encounter for screening for malignant neoplasm of prostate: Secondary | ICD-10-CM

## 2017-03-24 DIAGNOSIS — R319 Hematuria, unspecified: Secondary | ICD-10-CM | POA: Diagnosis not present

## 2017-03-24 DIAGNOSIS — I10 Essential (primary) hypertension: Secondary | ICD-10-CM

## 2017-03-24 LAB — LIPID PANEL
Cholesterol: 189 mg/dL (ref 0–200)
HDL: 64.1 mg/dL (ref >39.00)
LDL Cholesterol: 109 mg/dL — ABNORMAL HIGH (ref 0–99)
NonHDL: 125.29
Total CHOL/HDL Ratio: 3
Triglycerides: 82 mg/dL (ref 0.0–149.0)
VLDL: 16.4 mg/dL (ref 0.0–40.0)

## 2017-03-24 LAB — HEPATIC FUNCTION PANEL
ALT: 17 U/L (ref 0–53)
AST: 17 U/L (ref 0–37)
Albumin: 3.9 g/dL (ref 3.5–5.2)
Alkaline Phosphatase: 57 U/L (ref 39–117)
Bilirubin, Direct: 0.1 mg/dL (ref 0.0–0.3)
Total Bilirubin: 0.7 mg/dL (ref 0.2–1.2)
Total Protein: 6.4 g/dL (ref 6.0–8.3)

## 2017-03-24 LAB — BASIC METABOLIC PANEL
BUN: 8 mg/dL (ref 6–23)
CO2: 27 mEq/L (ref 19–32)
Calcium: 9 mg/dL (ref 8.4–10.5)
Chloride: 103 mEq/L (ref 96–112)
Creatinine, Ser: 0.91 mg/dL (ref 0.40–1.50)
GFR: 91.39 mL/min (ref >60.00)
Glucose, Bld: 87 mg/dL (ref 70–99)
Potassium: 4.5 mEq/L (ref 3.5–5.1)
Sodium: 138 mEq/L (ref 135–145)

## 2017-03-24 LAB — CBC WITH DIFFERENTIAL/PLATELET
Basophils Absolute: 0 10*3/uL (ref 0.0–0.1)
Basophils Relative: 0.7 % (ref 0.0–3.0)
Eosinophils Absolute: 0.2 10*3/uL (ref 0.0–0.7)
Eosinophils Relative: 2.4 % (ref 0.0–5.0)
HCT: 42.7 % (ref 39.0–52.0)
Hemoglobin: 14.5 g/dL (ref 13.0–17.0)
Lymphocytes Relative: 19 % (ref 12.0–46.0)
Lymphs Abs: 1.4 10*3/uL (ref 0.7–4.0)
MCHC: 33.8 g/dL (ref 30.0–36.0)
MCV: 99.7 fl (ref 78.0–100.0)
Monocytes Absolute: 0.5 10*3/uL (ref 0.1–1.0)
Monocytes Relative: 7.2 % (ref 3.0–12.0)
Neutro Abs: 5.1 10*3/uL (ref 1.4–7.7)
Neutrophils Relative %: 70.7 % (ref 43.0–77.0)
Platelets: 321 10*3/uL (ref 150.0–400.0)
RBC: 4.29 Mil/uL (ref 4.22–5.81)
RDW: 13.2 % (ref 11.5–15.5)
WBC: 7.3 10*3/uL (ref 4.0–10.5)

## 2017-03-24 LAB — POC URINALSYSI DIPSTICK (AUTOMATED)
Bilirubin, UA: NEGATIVE
Glucose, UA: NEGATIVE
Leukocytes, UA: NEGATIVE
Nitrite, UA: NEGATIVE
Spec Grav, UA: 1.02 (ref 1.010–1.025)
Urobilinogen, UA: 0.2 E.U./dL
pH, UA: 6.5 (ref 5.0–8.0)

## 2017-03-24 LAB — HEMOGLOBIN A1C: Hgb A1c MFr Bld: 5.3 % (ref 4.6–6.5)

## 2017-03-24 LAB — PSA: PSA: 0.54 ng/mL (ref 0.10–4.00)

## 2017-03-24 NOTE — Progress Notes (Signed)
Subjective:    Patient ID: Matthew Savage, male    DOB: May 31, 1960, 56 y.o.   MRN: [id number]  HPI  Patient presents for yearly preventative medicine examination. He is a pleasant 56 year old male who  has a past medical history of Blood in urine, DDD (degenerative disc disease), DVT (deep venous thrombosis) ([place]) (2017), Gout, Heart murmur, Hyperlipidemia, Hypertension, Numbness and tingling, and SCC (squamous cell carcinoma).  He takes lisinopril 20 mg for hypertension. BP elevated today in the office. He took his meds PTA.  BP Readings from Last 3 Encounters:  03/24/17 (!) 160/90  02/12/16 132/84  01/14/16 (!) 160/92    He takes Alen Blew due to history of DVT. He reports that he saw Cardiology at an outside facility and had echo done which showed " a enlarged valve". I do not have these records and will have patient get records for review   He continues to smoke and does not want quit.   All immunizations and health maintenance protocols were reviewed with the patient and needed orders were placed. Refuses flu shot  Appropriate screening laboratory values were ordered for the patient including screening of hyperlipidemia, renal function and hepatic function. If indicated by BPH, a PSA was ordered.  Medication reconciliation,  past medical history, social history, problem list and allergies were reviewed in detail with the patient  Goals were established with regard to weight loss, exercise, and  diet in compliance with medications. He does not exercise on a regular basis, nor does he eat a heart healthy diet.   Wt Readings from Last 3 Encounters:  03/24/17 271 lb (122.9 kg)  02/12/16 267 lb 3.2 oz (121.2 kg)  01/14/16 264 lb 14.4 oz (120.2 kg)   End of life planning was discussed.  He is due for a colonoscopy this year. He is UTD on dental and vision exams    Review of Systems  Constitutional: Negative.   HENT: Negative.   Eyes: Negative.   Respiratory: Negative.     Cardiovascular: Negative.   Gastrointestinal: Negative.   Endocrine: Negative.   Genitourinary: Negative.   Musculoskeletal: Positive for arthralgias and back pain.  Skin: Negative.   Allergic/Immunologic: Negative.   Neurological: Negative.   Hematological: Negative.   Psychiatric/Behavioral: Negative.   All other systems reviewed and are negative.  Past Medical History:  Diagnosis Date  . Blood in urine   . DDD (degenerative disc disease)   . DVT (deep venous thrombosis) ([place]) 2017  . Gout   . Heart murmur   . Hyperlipidemia   . Hypertension   . Numbness and tingling    right arm and hand  . SCC (squamous cell carcinoma)    skin, squamous cell, face    Social History   Socioeconomic History  . Marital status: Divorced    Spouse name: Not on file  . Number of children: Not on file  . Years of education: Not on file  . Highest education level: Not on file  Social Needs  . Financial resource strain: Not on file  . Food insecurity - worry: Not on file  . Food insecurity - inability: Not on file  . Transportation needs - medical: Not on file  . Transportation needs - non-medical: Not on file  Occupational History  . Not on file  Tobacco Use  . Smoking status: Current Every Day Smoker    Packs/day: 2.00    Years: 36.00    Pack years: 72.00  Types: Cigarettes  . Smokeless tobacco: Never Used  Substance and Sexual Activity  . Alcohol use: Yes    Alcohol/week: 6.0 oz    Types: 12 drink(s) per week    Comment: 2 per day  . Drug use: No  . Sexual activity: Not on file  Other Topics Concern  . 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    Married    No kids    Past Surgical History:  Procedure Laterality Date  . HERNIA REPAIR  1967   X 2  . LAPAROSCOPIC GASTRIC BANDING  03/2009   in [place], [place]  . MASS EXCISION  12/03/2011   X 2 (gluteal) X 1 (lower back)    Family History  Problem Relation Age of Onset  .  Cancer Mother        carcinoma  . Breast cancer Mother   . Hypertension Father   . Colon polyps Father   . Colon polyps Brother     No Known Allergies  Current Outpatient Medications on File Prior to Visit  Medication Sig Dispense Refill  . ibuprofen (ADVIL,MOTRIN) 200 MG tablet Take 800 mg by mouth every 6 (six) hours as needed.      Marland Kitchen lisinopril (PRINIVIL,ZESTRIL) 20 MG tablet TAKE 1 TABLET (20 MG TOTAL) BY MOUTH DAILY. 90 tablet 0  . Multiple Vitamin (MULTIVITAMIN) tablet Take 1 tablet by mouth daily.      . rivaroxaban (XARELTO) 20 MG TABS tablet Take 20 mg by mouth daily with supper.    . loratadine (CLARITIN) 10 MG tablet Take 10 mg by mouth daily.     No current facility-administered medications on file prior to visit.     BP (!) 160/90 (BP Location: Left Arm)   Temp 98.3 F (36.8 C) (Oral)   Ht 5' 11.5" (1.816 m)   Wt 271 lb (122.9 kg)   BMI 37.27 kg/m       Objective:   Physical Exam  Constitutional: He is oriented to person, place, and time. He appears well-developed and well-nourished. No distress.  Obese   HENT:  Head: Normocephalic and atraumatic.  Right Ear: External ear normal.  Left Ear: External ear normal.  Nose: Nose normal.  Mouth/Throat: Oropharynx is clear and moist. No oropharyngeal exudate.  Eyes: Conjunctivae and EOM are normal. Pupils are equal, round, and reactive to light. Right eye exhibits no discharge. Left eye exhibits no discharge. No scleral icterus.  Neck: Normal range of motion. Neck supple. No JVD present. No tracheal deviation present. No thyromegaly present.  Cardiovascular: Normal rate, regular rhythm and intact distal pulses. Exam reveals no gallop and no friction rub.  Murmur (heard best at right sternal border) heard. Pulmonary/Chest: Effort normal and breath sounds normal. No stridor. No respiratory distress. He has no wheezes. He has no rales. He exhibits no tenderness.  Abdominal: Soft. Bowel sounds are normal. He exhibits  no distension and no mass. There is no tenderness. There is no rebound and no guarding.  Genitourinary:  Genitourinary Comments: Deferred: will do PSA    Musculoskeletal: Normal range of motion. He exhibits no edema, tenderness or deformity.  Lymphadenopathy:    He has no cervical adenopathy.  Neurological: He is alert and oriented to person, place, and time. He has normal reflexes. He displays normal reflexes. No cranial nerve deficit. He exhibits normal muscle tone. Coordination normal.  Skin: Skin is warm and dry. No rash noted. He is  not diaphoretic. No erythema. No pallor.  Multiple blackheads throughout torso. Skin tags noted on neck  Psychiatric: He has a normal mood and affect. His behavior is normal. Judgment and thought content normal.  Nursing note and vitals reviewed.     Assessment & Plan:  1. Routine general medical examination at a health care facility - Needs to lose weight  - Needs to quit smoking  - Will have him release records from cardiology  - Basic metabolic panel - CBC with Differential/Platelet - Hemoglobin A1c - Hepatic function panel - Lipid panel - PSA - POCT Urinalysis Dipstick (Automated) - Hep C Antibody - Ambulatory referral to Gastroenterology  2. TOBACCO USE - Encouraged to quit   3. Obesity, unspecified classification, unspecified obesity type, unspecified whether serious comorbidity present - Encouraged weight loss through diet and exercise  - Basic metabolic panel - CBC with Differential/Platelet - Hemoglobin A1c - Hepatic function panel - Lipid panel - PSA - POCT Urinalysis Dipstick (Automated) - Hep C Antibody  4. Hyperlipidemia, unspecified hyperlipidemia type - Consider statin  - Basic metabolic panel - CBC with Differential/Platelet - Hemoglobin A1c - Hepatic function panel - Lipid panel - PSA - POCT Urinalysis Dipstick (Automated) - Hep C Antibody  5. Essential hypertension - Not at goal today. I am going to have him  monitor his BP at home and report back if consistently above 140/90 - Encouraged weight loss through diet and exercise  - Basic metabolic panel - CBC with Differential/Platelet - Hemoglobin A1c - Hepatic function panel - Lipid panel - PSA - POCT Urinalysis Dipstick (Automated) - Hep C Antibody  6. Colon cancer screening  - Ambulatory referral to Gastroenterology  7. Need for hepatitis C screening test  - Hep C Antibody   Dorothyann Peng, NP

## 2017-03-24 NOTE — Patient Instructions (Signed)
It was great seeing you this morning.   I will follow up with you regarding your blood work   Please monitor your BP at home and let me know if your readings are consistently above 140/90  Please quit smoking and work on weight loss   Health Maintenance, Male A healthy lifestyle and preventative care can promote health and wellness.  Maintain regular health, dental, and eye exams.  Eat a healthy diet. Foods like vegetables, fruits, whole grains, low-fat dairy products, and lean protein foods contain the nutrients you need and are low in calories. Decrease your intake of foods high in solid fats, added sugars, and salt. Get information about a proper diet from your health care provider, if necessary.  Regular physical exercise is one of the most important things you can do for your health. Most adults should get at least 150 minutes of moderate-intensity exercise (any activity that increases your heart rate and causes you to sweat) each week. In addition, most adults need muscle-strengthening exercises on 2 or more days a week.   Maintain a healthy weight. The body mass index (BMI) is a screening tool to identify possible weight problems. It provides an estimate of body fat based on height and weight. Your health care provider can find your BMI and can help you achieve or maintain a healthy weight. For males 20 years and older:  A BMI below 18.5 is considered underweight.  A BMI of 18.5 to 24.9 is normal.  A BMI of 25 to 29.9 is considered overweight.  A BMI of 30 and above is considered obese.  Maintain normal blood lipids and cholesterol by exercising and minimizing your intake of saturated fat. Eat a balanced diet with plenty of fruits and vegetables. Blood tests for lipids and cholesterol should begin at age 46 and be repeated every 5 years. If your lipid or cholesterol levels are high, you are over age 78, or you are at high risk for heart disease, you may need your cholesterol levels  checked more frequently.Ongoing high lipid and cholesterol levels should be treated with medicines if diet and exercise are not working.  If you smoke, find out from your health care provider how to quit. If you do not use tobacco, do not start.  Lung cancer screening is recommended for adults aged 66-80 years who are at high risk for developing lung cancer because of a history of smoking. A yearly low-dose CT scan of the lungs is recommended for people who have at least a 30-pack-year history of smoking and are current smokers or have quit within the past 15 years. A pack year of smoking is smoking an average of 1 pack of cigarettes a day for 1 year (for example, a 30-pack-year history of smoking could mean smoking 1 pack a day for 30 years or 2 packs a day for 15 years). Yearly screening should continue until the smoker has stopped smoking for at least 15 years. Yearly screening should be stopped for people who develop a health problem that would prevent them from having lung cancer treatment.  If you choose to drink alcohol, do not have more than 2 drinks per day. One drink is considered to be 12 oz (360 mL) of beer, 5 oz (150 mL) of wine, or 1.5 oz (45 mL) of liquor.  Avoid the use of street drugs. Do not share needles with anyone. Ask for help if you need support or instructions about stopping the use of drugs.  High blood  pressure causes heart disease and increases the risk of stroke. High blood pressure is more likely to develop in:  People who have blood pressure in the end of the normal range (100-139/85-89 mm Hg).  People who are overweight or obese.  People who are African American.  If you are 38-3 years of age, have your blood pressure checked every 3-5 years. If you are 75 years of age or older, have your blood pressure checked every year. You should have your blood pressure measured twice--once when you are at a hospital or clinic, and once when you are not at a hospital or clinic.  Record the average of the two measurements. To check your blood pressure when you are not at a hospital or clinic, you can use:  An automated blood pressure machine at a pharmacy.  A home blood pressure monitor.  If you are 59-43 years old, ask your health care provider if you should take aspirin to prevent heart disease.  Diabetes screening involves taking a blood sample to check your fasting blood sugar level. This should be done once every 3 years after age 41 if you are at a normal weight and without risk factors for diabetes. Testing should be considered at a younger age or be carried out more frequently if you are overweight and have at least 1 risk factor for diabetes.  Colorectal cancer can be detected and often prevented. Most routine colorectal cancer screening begins at the age of 36 and continues through age 39. However, your health care provider may recommend screening at an earlier age if you have risk factors for colon cancer. On a yearly basis, your health care provider may provide home test kits to check for hidden blood in the stool. A small camera at the end of a tube may be used to directly examine the colon (sigmoidoscopy or colonoscopy) to detect the earliest forms of colorectal cancer. Talk to your health care provider about this at age 75 when routine screening begins. A direct exam of the colon should be repeated every 5-10 years through age 50, unless early forms of precancerous polyps or small growths are found.  People who are at an increased risk for hepatitis B should be screened for this virus. You are considered at high risk for hepatitis B if:  You were born in a country where hepatitis B occurs often. Talk with your health care provider about which countries are considered high risk.  Your parents were born in a high-risk country and you have not received a shot to protect against hepatitis B (hepatitis B vaccine).  You have HIV or AIDS.  You use needles to  inject street drugs.  You live with, or have sex with, someone who has hepatitis B.  You are a man who has sex with other men (MSM).  You get hemodialysis treatment.  You take certain medicines for conditions like cancer, organ transplantation, and autoimmune conditions.  Hepatitis C blood testing is recommended for all people born from [birth year] through 1965 and any individual with known risk factors for hepatitis C.  Healthy men should no longer receive prostate-specific antigen (PSA) blood tests as part of routine cancer screening. Talk to your health care provider about prostate cancer screening.  Testicular cancer screening is not recommended for adolescents or adult males who have no symptoms. Screening includes self-exam, a health care provider exam, and other screening tests. Consult with your health care provider about any symptoms you have or any concerns you  have about testicular cancer.  Practice safe sex. Use condoms and avoid high-risk sexual practices to reduce the spread of sexually transmitted infections (STIs).  You should be screened for STIs, including gonorrhea and chlamydia if:  You are sexually active and are younger than 24 years.  You are older than 24 years, and your health care provider tells you that you are at risk for this type of infection.  Your sexual activity has changed since you were last screened, and you are at an increased risk for chlamydia or gonorrhea. Ask your health care provider if you are at risk.  If you are at risk of being infected with HIV, it is recommended that you take a prescription medicine daily to prevent HIV infection. This is called pre-exposure prophylaxis (PrEP). You are considered at risk if:  You are a man who has sex with other men (MSM).  You are a heterosexual man who is sexually active with multiple partners.  You take drugs by injection.  You are sexually active with a partner who has HIV.  Talk with your health care  provider about whether you are at high risk of being infected with HIV. If you choose to begin PrEP, you should first be tested for HIV. You should then be tested every 3 months for as long as you are taking PrEP.  Use sunscreen. Apply sunscreen liberally and repeatedly throughout the day. You should seek shade when your shadow is shorter than you. Protect yourself by wearing long sleeves, pants, a wide-brimmed hat, and sunglasses year round whenever you are outdoors.  Tell your health care provider of new moles or changes in moles, especially if there is a change in shape or color. Also, tell your health care provider if a mole is larger than the size of a pencil eraser.  A one-time screening for abdominal aortic aneurysm (AAA) and surgical repair of large AAAs by ultrasound is recommended for men aged 72-75 years who are current or former smokers.  Stay current with your vaccines (immunizations).   This information is not intended to replace advice given to you by your health care provider. Make sure you discuss any questions you have with your health care provider.   Document Released: 10/03/2007 Document Revised: 04/27/2014 Document Reviewed: 09/01/2010 Elsevier Interactive Patient Education [employer].

## 2017-03-25 ENCOUNTER — Telehealth: Payer: Self-pay | Admitting: Adult Health

## 2017-03-25 LAB — URINALYSIS, MICROSCOPIC ONLY

## 2017-03-25 LAB — HEPATITIS C ANTIBODY
Hepatitis C Ab: NONREACTIVE
SIGNAL TO CUT-OFF: 0.02 (ref <1.00)

## 2017-03-25 NOTE — Telephone Encounter (Signed)
Results given. Pt stated that he has been tested 5 times in the past regarding blood in his urine. States no one has ever been able to figure it out.    Notes recorded by Dorothyann Peng, NP on 03/24/2017 at 11:42 AM EST Overall, labs look good. His cholesterol panel has improved but LDL cholesterol still a little high  He does have blood in his urine. Has he ever been seen by Urology in the past for this?

## 2017-03-25 NOTE — Addendum Note (Signed)
Addended by: Tomi Likens on: 03/25/2017 08:33 AM   Modules accepted: Orders

## 2017-03-26 NOTE — Telephone Encounter (Signed)
Noted  

## 2017-04-01 ENCOUNTER — Encounter: Payer: Self-pay | Admitting: Family Medicine

## 2017-04-02 ENCOUNTER — Ambulatory Visit (INDEPENDENT_AMBULATORY_CARE_PROVIDER_SITE_OTHER): Payer: BLUE CROSS/BLUE SHIELD | Admitting: Physician Assistant

## 2017-04-02 ENCOUNTER — Telehealth: Payer: Self-pay | Admitting: Emergency Medicine

## 2017-04-02 ENCOUNTER — Encounter: Payer: Self-pay | Admitting: Physician Assistant

## 2017-04-02 VITALS — BP 132/80 | HR 76 | Ht 72.0 in | Wt 273.6 lb

## 2017-04-02 DIAGNOSIS — Z86718 Personal history of other venous thrombosis and embolism: Secondary | ICD-10-CM

## 2017-04-02 DIAGNOSIS — Z01818 Encounter for other preprocedural examination: Secondary | ICD-10-CM | POA: Diagnosis not present

## 2017-04-02 DIAGNOSIS — Z86010 Personal history of colonic polyps: Secondary | ICD-10-CM | POA: Diagnosis not present

## 2017-04-02 DIAGNOSIS — Z7901 Long term (current) use of anticoagulants: Secondary | ICD-10-CM | POA: Diagnosis not present

## 2017-04-02 MED ORDER — NA SULFATE-K SULFATE-MG SULF 17.5-3.13-1.6 GM/177ML PO SOLN: 1.0000 | ORAL | 0 refills | Status: DC

## 2017-04-02 NOTE — Telephone Encounter (Signed)
   Matthew Savage 09-Apr-1961 [id number]   We have scheduled the above named patient for a colonoscopy procedure. Our records show that he is on anticoagulation therapy.  Please advise as to whether the patient may come off their therapy of Xarleto 2 days prior to their procedure which is scheduled for 05-25-17.  Please route your response to Tinnie Gens, CMA.  Sincerely,    [facility]

## 2017-04-02 NOTE — Progress Notes (Addendum)
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      Patient was last seen in clinic by Dr. Hilarie Fredrickson for his colonoscopy 05/06/11 with a finding of 5 polyps, a mixture of adenomatous and hyperplastic polyps.  Patient was told to repeat colonoscopy in 5 years.    Today, the patient tells me that he has had 2 separate episodes of a DVT.  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 almost every day of the week, last July he developed a DVT and was started on Xarelto, he was on this for "a few months", and then had a recurrence of a DVT again in the summer and was placed back on Xarelto.  The patient is pretty sure that he will stay on this medication forever at this point due to his history.    Patient denies any GI complaints today, he denies fever, chills, blood in stool, melena, weight loss, change in appetite, change in bowel habits, heartburn, reflux or symptoms that awaken him at night.  Past Medical History:  Diagnosis Date  . Blood in urine   . DDD (degenerative disc disease)   . DVT (deep venous thrombosis) ([place]) 2017  . Gout   . Heart murmur   . Hyperlipidemia   . Hypertension   . Numbness and tingling    right arm and hand  . SCC (squamous cell carcinoma)    skin, squamous cell, face    Past Surgical History:  Procedure Laterality Date  . HERNIA REPAIR  1967   X 2  . LAPAROSCOPIC GASTRIC BANDING  03/2009   in [place], [place]  . MASS EXCISION  12/03/2011   X 2 (gluteal) X 1 (lower back)  . MOHS SURGERY     x 2    Current Outpatient Medications  Medication Sig Dispense Refill  . ibuprofen (ADVIL,MOTRIN) 200 MG tablet Take 800 mg by mouth  every 6 (six) hours as needed.      Marland Kitchen lisinopril (PRINIVIL,ZESTRIL) 20 MG tablet TAKE 1 TABLET (20 MG TOTAL) BY MOUTH DAILY. 90 tablet 0  . loratadine (CLARITIN) 10 MG tablet Take 10 mg by mouth daily.    . Multiple Vitamin (MULTIVITAMIN) tablet Take 1 tablet by mouth daily.      . rivaroxaban (XARELTO) 20 MG TABS tablet Take 20 mg by mouth daily with supper.     No current facility-administered medications for this visit.     Allergies as of 04/02/2017  . (No Known Allergies)    Family History  Problem Relation Age of Onset  . Breast cancer Mother   . Other Mother        small cell carcinoma  . Hypertension Father   . Colon polyps Father   . Colon polyps Brother     Social History   Socioeconomic History  . Marital status: Divorced    Spouse name: Not on file  . Number of children: Not on file  . Years of education: Not on file  . Highest education level: Not on file  Social Needs  . Financial resource strain: Not on file  . Food insecurity - worry: Not on  file  . Food insecurity - inability: Not on file  . Transportation needs - medical: Not on file  . Transportation needs - non-medical: Not on file  Occupational History  . Not on file  Tobacco Use  . Smoking status: Current Every Day Smoker    Packs/day: 2.00    Years: 36.00    Pack years: 72.00    Types: Cigarettes  . Smokeless tobacco: Never Used  Substance and Sexual Activity  . Alcohol use: Yes    Alcohol/week: 6.0 oz    Types: 12 Standard drinks or equivalent per week    Comment: 2 per day  . Drug use: No  . Sexual activity: Not on file  Other Topics Concern  . 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    Married    No kids    Review of Systems:    Constitutional: No weight loss, fever or chills Skin: No rash Cardiovascular: No chest pain Respiratory: No SOB Gastrointestinal: See HPI and otherwise negative Genitourinary: No dysuria Neurological: No  headache Musculoskeletal: No new muscle or joint pain Hematologic: No bleeding  Psychiatric: No history of depression or anxiety   Physical Exam:  Vital signs: BP 132/80   Pulse 76   Ht 6' (1.829 m)   Wt 273 lb 9.6 oz (124.1 kg)   BMI 37.11 kg/m   Constitutional: Overweight Caucasian male appears to be in NAD, Well developed, Well nourished, alert and cooperative Head:  Normocephalic and atraumatic. Eyes:   PEERL, EOMI. No icterus. Conjunctiva pink. Ears:  Normal auditory acuity. Neck:  Supple Throat: Oral cavity and pharynx without inflammation, swelling or lesion.  Respiratory: Respirations even and unlabored. Lungs clear to auscultation bilaterally.   No wheezes, crackles, or rhonchi.  Cardiovascular: Normal S1, S2. No MRG. Regular rate and rhythm. No peripheral edema, cyanosis or pallor.  Gastrointestinal:  Soft, nondistended, nontender. No rebound or guarding. Normal bowel sounds. No appreciable masses or hepatomegaly. Rectal:  Not performed.  Msk:  Symmetrical without gross deformities. Without edema, no deformity or joint abnormality.  Neurologic:  Alert and  oriented x4;  grossly normal neurologically.  Skin:   Dry and intact without significant lesions or rashes. Psychiatric: Demonstrates good judgement and reason without abnormal affect or behaviors.  MOST RECENT LABS AND IMAGING: CBC    Component Value Date/Time   WBC 7.3 03/24/2017 0733   RBC 4.29 03/24/2017 0733   HGB 14.5 03/24/2017 0733   HCT 42.7 03/24/2017 0733   PLT 321.0 03/24/2017 0733   MCV 99.7 03/24/2017 0733   MCHC 33.8 03/24/2017 0733   RDW 13.2 03/24/2017 0733   LYMPHSABS 1.4 03/24/2017 0733   MONOABS 0.5 03/24/2017 0733   EOSABS 0.2 03/24/2017 0733   BASOSABS 0.0 03/24/2017 0733    CMP     Component Value Date/Time   NA 138 03/24/2017 0733   NA 138 12/24/2016   K 4.5 03/24/2017 0733   CL 103 03/24/2017 0733   CO2 27 03/24/2017 0733   GLUCOSE 87 03/24/2017 0733   BUN 8 03/24/2017 0733     BUN 10 12/24/2016   CREATININE 0.91 03/24/2017 0733   CALCIUM 9.0 03/24/2017 0733   PROT 6.4 03/24/2017 0733   ALBUMIN 3.9 03/24/2017 0733   AST 17 03/24/2017 0733   ALT 17 03/24/2017 0733   ALKPHOS 57 03/24/2017 0733   BILITOT 0.7 03/24/2017 0733   GFRNONAA 157.99 01/13/2010 0851   GFRAA 116  08/23/2007 1007    Assessment: 1.  History of adenomatous colon polyps: Last colonoscopy in January 2013, mixture of adenomatous and hyperplastic polyps, repeat recommended in 5 years 2.  Chronic anticoagulation: With Xarelto for DVT 3.  History of DVT  Plan: 1.  Scheduled patient for a surveillance colonoscopy with Dr. Hilarie Fredrickson in the [facility].  Did discuss risks, benefits, limitations and alternatives and the patient agrees to proceed. 2.  Patient was advised to hold his Xarelto for 2 days prior to his procedure.  We will contact his primary care physician to ensure that holding his Xarelto is acceptable for him. 3.  Patient to return to clinic per recommendations from Dr. Hilarie Fredrickson after time of procedure  Ellouise Newer, PA-C [facility] 04/02/2017, 9:13 AM  Cc: Dorothyann Peng, NP   Addendum: Reviewed and agree with  management. Pyrtle, Lajuan Lines, MD

## 2017-04-02 NOTE — Patient Instructions (Addendum)

## 2017-04-05 NOTE — Telephone Encounter (Signed)
   Chart reviewed as part of pre-operative protocol coverage.   Patient appears to be on Xarelto for history of DVT.  He is not a patient of this practice and has never been seen by our providers before, thus we cannot comment on clearance. Will route this note back to requesting party.   Charlie Pitter, PA-C 04/05/2017, 2:34 PM

## 2017-04-06 ENCOUNTER — Encounter: Payer: Self-pay | Admitting: Adult Health

## 2017-04-06 ENCOUNTER — Ambulatory Visit: Payer: Self-pay | Admitting: *Deleted

## 2017-04-06 NOTE — Telephone Encounter (Signed)
Pt called with high blood pressure readings in the left arm 184/112(pulse 69) and right arm 183/112 using home monitor. Pt states his BP has been increased since using the home monitor with the lowest reading being 167/103. Pt states when he was seen for colonoscopy app on Friday reading in the office was 135/80. Pt is not having any current complaints of symptoms associated with high blood pressure. Pt concerned that his monitor may not be accurate. Appt made with Cory,NP on 12/19.     Reason for Disposition . Systolic BP  >= 115 OR Diastolic >= 520  Answer Assessment - Initial Assessment Questions 1. BLOOD PRESSURE: "What is the blood pressure?" "Did you take at least two measurements 5 minutes apart?"     Left arm 184/112 and right arm 183/112 2. ONSET: "When did you take your blood pressure?"    While on the phone with nurse 3. HOW: "How did you obtain the blood pressure?" (e.g., visiting nurse, automatic home BP monitor)     Home BP monitor 4. HISTORY: "Do you have a history of high blood pressure?"     yes 5. MEDICATIONS: "Are you taking any medications for blood pressure?" "Have you missed any doses recently?"     No 6. OTHER SYMPTOMS: "Do you have any symptoms?" (e.g., headache, chest pain, blurred vision, difficulty breathing, weakness)     No  Protocols used: HIGH BLOOD PRESSURE-A-AH

## 2017-04-06 NOTE — Telephone Encounter (Signed)
Can we call and see who is prescribing his Alen Blew?

## 2017-04-07 ENCOUNTER — Encounter: Payer: Self-pay | Admitting: Adult Health

## 2017-04-07 ENCOUNTER — Ambulatory Visit: Payer: BLUE CROSS/BLUE SHIELD | Admitting: Adult Health

## 2017-04-07 VITALS — BP 182/104 | Temp 98.6°F | Wt 269.0 lb

## 2017-04-07 DIAGNOSIS — J0110 Acute frontal sinusitis, unspecified: Secondary | ICD-10-CM | POA: Diagnosis not present

## 2017-04-07 DIAGNOSIS — I10 Essential (primary) hypertension: Secondary | ICD-10-CM

## 2017-04-07 MED ORDER — DOXYCYCLINE HYCLATE 100 MG PO CAPS: 100.0000 mg | ORAL_CAPSULE | Freq: Two times a day (BID) | ORAL | 0 refills | Status: DC

## 2017-04-07 MED ORDER — CYCLOBENZAPRINE HCL 10 MG PO TABS: 10.0000 mg | ORAL_TABLET | Freq: Three times a day (TID) | ORAL | 0 refills | Status: DC | PRN

## 2017-04-07 NOTE — Telephone Encounter (Signed)
Noted  

## 2017-04-07 NOTE — Telephone Encounter (Signed)
I believe his Cardiologist is at [facility]. He will need to get a clearance from them in order to have a colonoscopy

## 2017-04-07 NOTE — Telephone Encounter (Signed)
Spoke to patient and found his cardiologist at [facility] and faxed clearance letter to Dr. Claudie Leach.

## 2017-04-07 NOTE — Progress Notes (Signed)
Subjective:    Patient ID: Matthew Savage, male    DOB: 1960/12/04, 56 y.o.   MRN: [id number]  HPI  56 year old male who  has a past medical history of Blood in urine, DDD (degenerative disc disease), DVT (deep venous thrombosis) ([place]) (2017 & 2018), Gout, Heart murmur, Hyperlipidemia, Hypertension, Numbness and tingling, and SCC (squamous cell carcinoma).  He presents to the office today for follow up regarding blood pressure. He has been monitoring at home and reports readings at home of 184/112. The lowest it has been was 167/103. He was seen for his preop visit for his colonoscopy his BP was 132/80.   He does report that over the last few days he has been " guzzling" Nyquil and other OTC cold medications at home due to URI like symptoms. These symptoms started 2-3 days ago and consist of sinus pain/pressure, productive cough, PND, runny nose ,and sore throat. He does not feel as though any of this medication is helping. Denies any fevers. Feels as though this symptoms are getting worse   BP Readings from Last 3 Encounters:  04/07/17 (!) 182/104  04/02/17 132/80  03/24/17 (!) 160/90    Review of Systems See HPI   Past Medical History:  Diagnosis Date  . Blood in urine   . DDD (degenerative disc disease)   . DVT (deep venous thrombosis) ([place]) 2017 & 2018  . Gout   . Heart murmur   . Hyperlipidemia   . Hypertension   . Numbness and tingling    right arm and hand  . SCC (squamous cell carcinoma)    skin, squamous cell, face    Social History   Socioeconomic History  . Marital status: Divorced    Spouse name: Not on file  . Number of children: Not on file  . Years of education: Not on file  . Highest education level: Not on file  Social Needs  . Financial resource strain: Not on file  . Food insecurity - worry: Not on file  . Food insecurity - inability: Not on file  . Transportation needs - medical: Not on file  . Transportation needs - non-medical: Not on file    Occupational History  . Not on file  Tobacco Use  . Smoking status: Current Every Day Smoker    Packs/day: 2.00    Years: 36.00    Pack years: 72.00    Types: Cigarettes  . Smokeless tobacco: Never Used  Substance and Sexual Activity  . Alcohol use: Yes    Alcohol/week: 6.0 oz    Types: 12 Standard drinks or equivalent per week    Comment: 2 per day  . Drug use: No  . Sexual activity: Not on file  Other Topics Concern  . 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    Married    No kids    Past Surgical History:  Procedure Laterality Date  . HERNIA REPAIR  1967   X 2  . LAPAROSCOPIC GASTRIC BANDING  03/2009   in [place], [place]  . MASS EXCISION  12/03/2011   X 2 (gluteal) X 1 (lower back)  . MOHS SURGERY     x 2    Family History  Problem Relation Age of Onset  . Breast cancer Mother   . Other Mother        small cell carcinoma  . Hypertension Father   . Colon polyps Father   .  Colon polyps Brother     No Known Allergies  Current Outpatient Medications on File Prior to Visit  Medication Sig Dispense Refill  . ibuprofen (ADVIL,MOTRIN) 200 MG tablet Take 800 mg by mouth every 6 (six) hours as needed.      Marland Kitchen lisinopril (PRINIVIL,ZESTRIL) 20 MG tablet TAKE 1 TABLET (20 MG TOTAL) BY MOUTH DAILY. 90 tablet 0  . loratadine (CLARITIN) 10 MG tablet Take 10 mg by mouth daily.    . Multiple Vitamin (MULTIVITAMIN) tablet Take 1 tablet by mouth daily.      . Na Sulfate-K Sulfate-Mg Sulf (SUPREP BOWEL PREP KIT) 17.5-3.13-1.6 GM/177ML SOLN Take 1 kit by mouth as directed. 324 mL 0  . rivaroxaban (XARELTO) 20 MG TABS tablet Take 20 mg by mouth daily with supper.     No current facility-administered medications on file prior to visit.     BP (!) 182/104 (BP Location: Right Arm, Patient Position: Sitting, Cuff Size: Large)   Temp 98.6 F (37 C) (Oral)   Wt 269 lb (122 kg)   BMI 36.48 kg/m       Objective:   Physical Exam   Constitutional: He is oriented to person, place, and time. He appears well-developed and well-nourished. No distress.  HENT:  Head: Normocephalic and atraumatic.  Right Ear: Hearing, tympanic membrane, external ear and ear canal normal.  Left Ear: Hearing, tympanic membrane, external ear and ear canal normal.  Nose: Mucosal edema and rhinorrhea present. Right sinus exhibits frontal sinus tenderness. Right sinus exhibits no maxillary sinus tenderness. Left sinus exhibits frontal sinus tenderness. Left sinus exhibits no maxillary sinus tenderness.  Mouth/Throat: Uvula is midline, oropharynx is clear and moist and mucous membranes are normal.  Eyes: Conjunctivae are normal. Pupils are equal, round, and reactive to light. Right eye exhibits no discharge. Left eye exhibits no discharge. No scleral icterus.  Neck: Normal range of motion. Neck supple. No thyromegaly present.  Cardiovascular: Normal rate, regular rhythm, normal heart sounds and intact distal pulses. Exam reveals no gallop and no friction rub.  No murmur heard. Pulmonary/Chest: Effort normal and breath sounds normal. No respiratory distress. He has no wheezes. He has no rales. He exhibits no tenderness.  Lymphadenopathy:    He has no cervical adenopathy.  Neurological: He is alert and oriented to person, place, and time.  Skin: Skin is warm and dry. No rash noted. He is not diaphoretic. No erythema. No pallor.  Psychiatric: He has a normal mood and affect. His behavior is normal. Thought content normal.  Nursing note and vitals reviewed.     Assessment & Plan:  1. Essential hypertension - elevation in BP likely from OTC cold medication  - Stop taking this medication - Take an extra dose of lisinopril tonight.  - Start monitoring BP at home on Friday  - Send me mychart message with readings   2. Acute non-recurrent frontal sinusitis  - doxycycline (VIBRAMYCIN) 100 MG capsule; Take 1 capsule (100 mg total) by mouth 2 (two) times  daily.  Dispense: 14 capsule; Refill: 0 - cyclobenzaprine (FLEXERIL) 10 MG tablet; Take 1 tablet (10 mg total) by mouth 3 (three) times daily as needed for muscle spasms.  Dispense: 30 tablet; Refill: 0  Dorothyann Peng, NP

## 2017-04-07 NOTE — Patient Instructions (Signed)
Stop the OTC cold medication   I have sent in a prescription for an antibiotics and flonase   Take an extra dose of lisinopril when you get home.   Send me results via mychart

## 2017-04-10 ENCOUNTER — Encounter: Payer: Self-pay | Admitting: Adult Health

## 2017-04-22 ENCOUNTER — Encounter: Payer: Self-pay | Admitting: Adult Health

## 2017-04-22 ENCOUNTER — Other Ambulatory Visit: Payer: Self-pay | Admitting: Adult Health

## 2017-04-22 DIAGNOSIS — I10 Essential (primary) hypertension: Secondary | ICD-10-CM

## 2017-04-22 MED ORDER — AMLODIPINE BESYLATE 5 MG PO TABS: 5.0000 mg | ORAL_TABLET | Freq: Every day | ORAL | 1 refills | Status: DC

## 2017-04-22 MED ORDER — LISINOPRIL 40 MG PO TABS: 20.0000 mg | ORAL_TABLET | Freq: Every day | ORAL | 3 refills | Status: DC

## 2017-04-28 ENCOUNTER — Encounter: Payer: Self-pay | Admitting: Adult Health

## 2017-04-30 ENCOUNTER — Other Ambulatory Visit: Payer: Self-pay | Admitting: Adult Health

## 2017-04-30 DIAGNOSIS — I10 Essential (primary) hypertension: Secondary | ICD-10-CM

## 2017-05-04 NOTE — Telephone Encounter (Signed)
FILLED FOR 1 YEAR ON 04/22/2017.  MESSAGE SENT TO THE PHARMACY TO CHECK FILE.

## 2017-05-10 ENCOUNTER — Encounter: Payer: Self-pay | Admitting: Physician Assistant

## 2017-05-10 NOTE — Telephone Encounter (Signed)
Called and left message with Dr. Verdon Cummins nurse.

## 2017-05-13 ENCOUNTER — Telehealth: Payer: Self-pay | Admitting: Internal Medicine

## 2017-05-13 NOTE — Telephone Encounter (Signed)
Matthew Savage I think you are looking for this call.

## 2017-05-14 NOTE — Telephone Encounter (Signed)
Spoke to patient and he states he spoke to Dr. Verdon Cummins nurse Pamala Hurry and they ok'd him to stop Eliquis 48 hours before procedure and 24 hours after. Patient expressed understanding.

## 2017-05-17 ENCOUNTER — Encounter: Payer: Self-pay | Admitting: Internal Medicine

## 2017-05-18 NOTE — Telephone Encounter (Signed)
Called patient he states he spoke to the nurse at [facility] and was told that he could stop Xarelto 24 hours before procedure and resume 24 hours after. I told him that was fine but that we still needed to have documentation stating such faxed over to our office.   [facility] medical (Dr. Verdon Cummins office) they do not have any record of a phone call to the patient telling him to stop Xarelto.   Will refax anticoagulation letter to nurse names Bier whom I spoke with. I informed her this is urgent and I need an answer ASAP.

## 2017-05-19 ENCOUNTER — Telehealth: Payer: Self-pay | Admitting: Internal Medicine

## 2017-05-19 NOTE — Telephone Encounter (Signed)
Received fax from Dr. Claudie Leach stating it was ok for patient to hold Xarelto 2 days prior to his procedure. Pt informed and verbalized understanding. Will send letter to be scanned.

## 2017-05-19 NOTE — Telephone Encounter (Signed)
Left message for pt to call back.  Discussed with pt that it should be fine for him to take amoxicillin for possible sinus infection prior to colon.

## 2017-05-20 ENCOUNTER — Telehealth: Payer: Self-pay

## 2017-05-21 MED ORDER — LISINOPRIL 40 MG PO TABS: 40.0000 mg | ORAL_TABLET | Freq: Every day | ORAL | 3 refills | Status: DC

## 2017-05-21 NOTE — Telephone Encounter (Signed)
Medication filled to pharmacy as requested. It was not done yesterday.

## 2017-05-21 NOTE — Telephone Encounter (Signed)
Fax received from [facility] requesting lisinopril sig be changed to take 1 tablet daily (instead of 0.5 tab).  Pt's dose has been adjusted via MyChart messages a few times. Do you want him to continue taking 40 mg (1 tablet) daily?

## 2017-05-21 NOTE — Telephone Encounter (Signed)
Yes 40 mg please. I think [name] took care of this yesterday?

## 2017-05-25 ENCOUNTER — Ambulatory Visit (AMBULATORY_SURGERY_CENTER): Payer: BLUE CROSS/BLUE SHIELD | Admitting: Internal Medicine

## 2017-05-25 ENCOUNTER — Encounter: Payer: Self-pay | Admitting: Internal Medicine

## 2017-05-25 ENCOUNTER — Other Ambulatory Visit: Payer: Self-pay

## 2017-05-25 VITALS — BP 153/90 | HR 64 | Temp 98.4°F | Resp 13 | Ht 72.0 in | Wt 269.0 lb

## 2017-05-25 DIAGNOSIS — Z86010 Personal history of colonic polyps: Secondary | ICD-10-CM

## 2017-05-25 DIAGNOSIS — D125 Benign neoplasm of sigmoid colon: Secondary | ICD-10-CM

## 2017-05-25 DIAGNOSIS — K635 Polyp of colon: Secondary | ICD-10-CM

## 2017-05-25 MED ORDER — SODIUM CHLORIDE 0.9 % IV SOLN: 500.0000 mL | Freq: Once | INTRAVENOUS | Status: DC

## 2017-05-25 NOTE — Patient Instructions (Signed)
Discharge instructions given. Handouts on polyps,diverticulosis and hemorrhoids. Resume previous medications. YOU HAD AN ENDOSCOPIC PROCEDURE TODAY AT THE [facility]:   Refer to the procedure report that was given to you for any specific questions about what was found during the examination.  If the procedure report does not answer your questions, please call your gastroenterologist to clarify.  If you requested that your care partner not be given the details of your procedure findings, then the procedure report has been included in a sealed envelope for you to review at your convenience later.  YOU SHOULD EXPECT: Some feelings of bloating in the abdomen. Passage of more gas than usual.  Walking can help get rid of the air that was put into your GI tract during the procedure and reduce the bloating. If you had a lower endoscopy (such as a colonoscopy or flexible sigmoidoscopy) you may notice spotting of blood in your stool or on the toilet paper. If you underwent a bowel prep for your procedure, you may not have a normal bowel movement for a few days.  Please Note:  You might notice some irritation and congestion in your nose or some drainage.  This is from the oxygen used during your procedure.  There is no need for concern and it should clear up in a day or so.  SYMPTOMS TO REPORT IMMEDIATELY:   Following lower endoscopy (colonoscopy or flexible sigmoidoscopy):  Excessive amounts of blood in the stool  Significant tenderness or worsening of abdominal pains  Swelling of the abdomen that is new, acute  Fever of 100F or higher   For urgent or emergent issues, a gastroenterologist can be reached at any hour by calling [phone].   DIET:  We do recommend a small meal at first, but then you may proceed to your regular diet.  Drink plenty of fluids but you should avoid alcoholic beverages for 24 hours.  ACTIVITY:  You should plan to take it easy for the rest of today and you  should NOT DRIVE or use heavy machinery until tomorrow (because of the sedation medicines used during the test).    FOLLOW UP: Our staff will call the number listed on your records the next business day following your procedure to check on you and address any questions or concerns that you may have regarding the information given to you following your procedure. If we do not reach you, we will leave a message.  However, if you are feeling well and you are not experiencing any problems, there is no need to return our call.  We will assume that you have returned to your regular daily activities without incident.  If any biopsies were taken you will be contacted by phone or by letter within the next 1-3 weeks.  Please call us at [phone] if you have not heard about the biopsies in 3 weeks.    SIGNATURES/CONFIDENTIALITY: You and/or your care partner have signed paperwork which will be entered into your electronic medical record.  These signatures attest to the fact that that the information above on your After Visit Summary has been reviewed and is understood.  Full responsibility of the confidentiality of this discharge information lies with you and/or your care-partner. 

## 2017-05-25 NOTE — Progress Notes (Signed)
Pt's states no medical or surgical changes since previsit or office visit. 

## 2017-05-25 NOTE — Op Note (Signed)
[facility] Patient Name: Matthew Savage Procedure Date: 05/25/2017 7:23 AM MRN: [id number] Endoscopist: Jerene Bears , MD Age: 57 Referring MD:  Date of Birth: 19-Jan-1961 Gender: Male Account #: [id number] Procedure:                Colonoscopy Indications:              Surveillance: Personal history of adenomatous                            polyps on last colonoscopy 5 years ago Medicines:                Monitored Anesthesia Care Procedure:                Pre-Anesthesia Assessment:                           - Prior to the procedure, a History and Physical                            was performed, and patient medications and                            allergies were reviewed. The patient's tolerance of                            previous anesthesia was also reviewed. The risks                            and benefits of the procedure and the sedation                            options and risks were discussed with the patient.                            All questions were answered, and informed consent                            was obtained. Prior Anticoagulants: The patient has                            taken Xarelto (rivaroxaban), last dose was 2 days                            prior to procedure. ASA Grade Assessment: III - A                            patient with severe systemic disease. After                            reviewing the risks and benefits, the patient was                            deemed in satisfactory condition to undergo the  procedure.                           After obtaining informed consent, the colonoscope                            was passed under direct vision. Throughout the                            procedure, the patient's blood pressure, pulse, and                            oxygen saturations were monitored continuously. The                            Colonoscope was introduced through the anus and              advanced to the the cecum, identified by                            appendiceal orifice and ileocecal valve. The                            colonoscopy was performed without difficulty. The                            patient tolerated the procedure well. The quality                            of the bowel preparation was good. The ileocecal                            valve, appendiceal orifice, and rectum were                            photographed. Scope In: 8:09:57 AM Scope Out: 8:23:31 AM Scope Withdrawal Time: 0 hours 11 minutes 13 seconds  Total Procedure Duration: 0 hours 13 minutes 34 seconds  Findings:                 The digital rectal exam was normal.                           Two sessile polyps were found in the distal sigmoid                            colon. The polyps were 4 to 5 mm in size. These                            polyps were removed with a cold snare. Resection                            and retrieval were complete.  Multiple small and large-mouthed diverticula were                            found in the sigmoid colon and descending colon.                           Internal hemorrhoids were found during                            retroflexion. The hemorrhoids were small. Complications:            No immediate complications. Estimated Blood Loss:     Estimated blood loss was minimal. Impression:               - Two 4 to 5 mm polyps in the distal sigmoid colon,                            removed with a cold snare. Resected and retrieved.                           - Moderate diverticulosis in the sigmoid colon and                            in the descending colon.                           - Internal hemorrhoids. Recommendation:           - Patient has a contact number available for                            emergencies. The signs and symptoms of potential                            delayed complications were discussed with  the                            patient. Return to normal activities tomorrow.                            Written discharge instructions were provided to the                            patient.                           - Resume previous diet.                           - Continue present medications.                           - Resume Xarelto (rivaroxaban) at prior dose today.                            Refer to managing physician for further adjustment  of therapy.                           - Await pathology results.                           - Repeat colonoscopy in 5 years for surveillance. Jerene Bears, MD 05/25/2017 8:35:39 AM This report has been signed electronically.

## 2017-05-25 NOTE — Progress Notes (Signed)
Called to room to assist during endoscopic procedure.  Patient ID and intended procedure confirmed with present staff. Received instructions for my participation in the procedure from the performing physician.  

## 2017-05-26 ENCOUNTER — Telehealth: Payer: Self-pay | Admitting: *Deleted

## 2017-05-26 NOTE — Telephone Encounter (Signed)
No answer, left message to call if questions or concerns. 

## 2017-05-26 NOTE — Telephone Encounter (Signed)
No answer, second call.  Left message to call if questions or concerns. 

## 2017-05-28 ENCOUNTER — Encounter: Payer: Self-pay | Admitting: Internal Medicine

## 2017-06-01 ENCOUNTER — Encounter: Payer: Self-pay | Admitting: Adult Health

## 2017-06-02 MED ORDER — AMLODIPINE BESYLATE 5 MG PO TABS: 5.0000 mg | ORAL_TABLET | Freq: Every day | ORAL | 0 refills | Status: DC

## 2017-06-03 ENCOUNTER — Encounter: Payer: Self-pay | Admitting: Internal Medicine

## 2017-06-15 ENCOUNTER — Ambulatory Visit: Payer: BLUE CROSS/BLUE SHIELD | Admitting: Adult Health

## 2017-06-15 ENCOUNTER — Encounter: Payer: Self-pay | Admitting: Adult Health

## 2017-06-15 VITALS — BP 142/90 | Temp 98.2°F | Wt 267.0 lb

## 2017-06-15 DIAGNOSIS — L02818 Cutaneous abscess of other sites: Secondary | ICD-10-CM

## 2017-06-15 DIAGNOSIS — D499 Neoplasm of unspecified behavior of unspecified site: Secondary | ICD-10-CM | POA: Diagnosis not present

## 2017-06-15 DIAGNOSIS — I10 Essential (primary) hypertension: Secondary | ICD-10-CM

## 2017-06-15 MED ORDER — AMLODIPINE BESYLATE 10 MG PO TABS: 10.0000 mg | ORAL_TABLET | Freq: Every day | ORAL | 1 refills | Status: DC

## 2017-06-15 MED ORDER — DOXYCYCLINE HYCLATE 100 MG PO CAPS: 100.0000 mg | ORAL_CAPSULE | Freq: Two times a day (BID) | ORAL | 0 refills | Status: DC

## 2017-06-15 NOTE — Progress Notes (Signed)
Subjective:    Patient ID: Matthew Savage, male    DOB: 03/16/1961, 57 y.o.   MRN: [id number]  HPI  57 year old male who  has a past medical history of Blood in urine, DDD (degenerative disc disease), DVT (deep venous thrombosis) ([place]) (2017 & 2018), Gout, Heart murmur, Hyperlipidemia, Hypertension, Numbness and tingling, and SCC (squamous cell carcinoma).  He presents to the office today for follow up regarding hypertension. He is currently maintained on 5 mg Norvasc, and lisinopril 40 mg. He reports home blood pressure readings in the 160/90-100 at home. Today he is 142/90.   BP Readings from Last 3 Encounters:  06/15/17 (!) 142/90  05/25/17 (!) 153/90  04/07/17 (!) 182/104    Also reports a reoccuring abscess on his right ear lobe, has been present for one week. Has not been draining. Painful and red to rouch   Additionally, complaining of lesion on his buttocks that causes discomfort when he rides his motorcycle. History of skin cancer   Review of Systems  Respiratory: Negative.   Cardiovascular: Negative.   Genitourinary: Negative.   Skin: Positive for color change and wound.   See HPI  Past Medical History:  Diagnosis Date  . Blood in urine   . DDD (degenerative disc disease)   . DVT (deep venous thrombosis) ([place]) 2017 & 2018  . Gout   . Heart murmur   . Hyperlipidemia   . Hypertension   . Numbness and tingling    right arm and hand  . SCC (squamous cell carcinoma)    skin, squamous cell, face    Social History   Socioeconomic History  . Marital status: Divorced    Spouse name: Not on file  . Number of children: Not on file  . Years of education: Not on file  . Highest education level: Not on file  Social Needs  . Financial resource strain: Not on file  . Food insecurity - worry: Not on file  . Food insecurity - inability: Not on file  . Transportation needs - medical: Not on file  . Transportation needs - non-medical: Not on file  Occupational History   . Not on file  Tobacco Use  . Smoking status: Current Every Day Smoker    Packs/day: 2.00    Years: 36.00    Pack years: 72.00    Types: Cigarettes  . Smokeless tobacco: Never Used  Substance and Sexual Activity  . Alcohol use: Yes    Alcohol/week: 6.0 oz    Types: 12 Standard drinks or equivalent per week    Comment: 2 per day  . Drug use: No  . Sexual activity: Not on file  Other Topics Concern  . 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    Married    No kids    Past Surgical History:  Procedure Laterality Date  . HERNIA REPAIR  1967   X 2  . LAPAROSCOPIC GASTRIC BANDING  03/2009   in [place], [place]  . MASS EXCISION  12/03/2011   X 2 (gluteal) X 1 (lower back)  . MOHS SURGERY     x 2    Family History  Problem Relation Age of Onset  . Breast cancer Mother   . Other Mother        small cell carcinoma  . Hypertension Father   . Colon polyps Father   . Colon polyps Brother  No Known Allergies  Current Outpatient Medications on File Prior to Visit  Medication Sig Dispense Refill  . amLODipine (NORVASC) 5 MG tablet Take 1 tablet (5 mg total) by mouth daily. 30 tablet 0  . ibuprofen (ADVIL,MOTRIN) 200 MG tablet Take 800 mg by mouth every 6 (six) hours as needed.      Marland Kitchen lisinopril (PRINIVIL,ZESTRIL) 40 MG tablet Take 1 tablet (40 mg total) by mouth daily. 90 tablet 3  . loratadine (CLARITIN) 10 MG tablet Take 10 mg by mouth daily.    . Multiple Vitamin (MULTIVITAMIN) tablet Take 1 tablet by mouth daily.      . rivaroxaban (XARELTO) 20 MG TABS tablet Take 20 mg by mouth daily with supper.     Current Facility-Administered Medications on File Prior to Visit  Medication Dose Route Frequency Provider Last Rate Last Dose  . 0.9 %  sodium chloride infusion  500 mL Intravenous Once Pyrtle, Lajuan Lines, MD        BP (!) 142/90 (BP Location: Left Arm)   Temp 98.2 F (36.8 C) (Oral)   Wt 267 lb (121.1 kg)   BMI 36.21 kg/m         Objective:   Physical Exam  Constitutional: He is oriented to person, place, and time. He appears well-developed and well-nourished. No distress.  Cardiovascular: Normal rate, regular rhythm, normal heart sounds and intact distal pulses. Exam reveals no gallop and no friction rub.  No murmur heard. Pulmonary/Chest: Effort normal and breath sounds normal. No respiratory distress. He has no wheezes. He has no rales. He exhibits no tenderness.  Neurological: He is alert and oriented to person, place, and time.  Skin: He is not diaphoretic.  Non fluctuant abscess noted on right ear lobe. Painful to touch. Redness noted and slightly warm   Stalk like growth noted on intergluteal cleft.    Psychiatric: He has a normal mood and affect. His behavior is normal. Judgment and thought content normal.  Nursing note and vitals reviewed.     Assessment & Plan:  1. Essential hypertension - Will increase Norvasc from 5 mg to 10  - Follow up in one month  - amLODipine (NORVASC) 10 MG tablet; Take 1 tablet (10 mg total) by mouth daily.  Dispense: 90 tablet; Refill: 1  2. 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 Cutaneous abscess of other site - not able to I& D today. Advised warm compress and follow up as needed - doxycycline (VIBRAMYCIN) 100 MG capsule; Take 1 capsule (100 mg total) by mouth 2 (two) times daily.  Dispense: 20 capsule; Refill: 0   Dorothyann Peng, NP

## 2017-06-15 NOTE — Progress Notes (Signed)
Subjective:    Patient ID: Matthew Savage, male    DOB: 12/21/1960, 57 y.o.   MRN: [id number]  HPI Presents for follow-up of HTN.  He is currently taking amlodipine 5mg  po QD and lisinopril 40 mg po QD. His blood pressure at his last visit (03/2017) was 182/104.  His blood pressure readings at home have been around 160/107.  He as only been taking ibuprofen once a month. He denies headache, dizziness, weakness, blurred vision. He is still smoking 2 packs per day and drinking 2 beers and 2 hi-balls a day. He has followed a low sat diet but has not been exercising.  Discussed smoking and alcohol cessation.   He feels like his right ear lobe is infected.  He has had a cyst drained in that ear and it re-accumulates and he drained it himself. He has been using Boil-ease without drawing out any fluid.  It has become red and swollen.    Also has an area on his buttocks at the top of his anal crevice. He has scratched this area off and removed it in the past.   Review of Systems  Constitutional: Positive for fatigue.  HENT: Negative for congestion, sinus pressure, sinus pain and sore throat.   Respiratory: Negative for chest tightness and shortness of breath.   Cardiovascular: Negative.  Negative for chest pain, palpitations and leg swelling.  Gastrointestinal: Negative for abdominal pain.  Musculoskeletal: Positive for back pain.       No new back pain      Past Medical History:  Diagnosis Date  . Blood in urine   . DDD (degenerative disc disease)   . DVT (deep venous thrombosis) ([place]) 2017 & 2018  . Gout   . Heart murmur   . Hyperlipidemia   . Hypertension   . Numbness and tingling    right arm and hand  . SCC (squamous cell carcinoma)    skin, squamous cell, face    Social History   Socioeconomic History  . Marital status: Divorced    Spouse name: Not on file  . Number of children: Not on file  . Years of education: Not on file  . Highest education level: Not on file  Social  Needs  . Financial resource strain: Not on file  . Food insecurity - worry: Not on file  . Food insecurity - inability: Not on file  . Transportation needs - medical: Not on file  . Transportation needs - non-medical: Not on file  Occupational History  . Not on file  Tobacco Use  . Smoking status: Current Every Day Smoker    Packs/day: 2.00    Years: 36.00    Pack years: 72.00    Types: Cigarettes  . Smokeless tobacco: Never Used  Substance and Sexual Activity  . Alcohol use: Yes    Alcohol/week: 6.0 oz    Types: 12 Standard drinks or equivalent per week    Comment: 2 per day  . Drug use: No  . Sexual activity: Not on file  Other Topics Concern  . 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    Married    No kids    Past Surgical History:  Procedure Laterality Date  . HERNIA REPAIR  1967   X 2  . LAPAROSCOPIC GASTRIC BANDING  03/2009   in [place], [place]  . MASS EXCISION  12/03/2011   X 2 (gluteal) X 1 (lower  back)  . MOHS SURGERY     x 2    Family History  Problem Relation Age of Onset  . Breast cancer Mother   . Other Mother        small cell carcinoma  . Hypertension Father   . Colon polyps Father   . Colon polyps Brother     No Known Allergies  Current Outpatient Medications on File Prior to Visit  Medication Sig Dispense Refill  . amLODipine (NORVASC) 5 MG tablet Take 1 tablet (5 mg total) by mouth daily. 30 tablet 0  . ibuprofen (ADVIL,MOTRIN) 200 MG tablet Take 800 mg by mouth every 6 (six) hours as needed.      Marland Kitchen lisinopril (PRINIVIL,ZESTRIL) 40 MG tablet Take 1 tablet (40 mg total) by mouth daily. 90 tablet 3  . loratadine (CLARITIN) 10 MG tablet Take 10 mg by mouth daily.    . Multiple Vitamin (MULTIVITAMIN) tablet Take 1 tablet by mouth daily.      . rivaroxaban (XARELTO) 20 MG TABS tablet Take 20 mg by mouth daily with supper.     Current Facility-Administered Medications on File Prior to Visit  Medication Dose  Route Frequency Provider Last Rate Last Dose  . 0.9 %  sodium chloride infusion  500 mL Intravenous Once Pyrtle, Lajuan Lines, MD        BP (!) 142/90 (BP Location: Left Arm)   Temp 98.2 F (36.8 C) (Oral)   Wt 267 lb (121.1 kg)   BMI 36.21 kg/m    Objective:   Physical Exam  Constitutional: He appears well-developed and well-nourished. No distress.  HENT:  Right Ear: There is swelling and tenderness.  Ears:  Hardened, red and hot area on the right earlobe and   Cardiovascular: Normal rate, regular rhythm and normal heart sounds. Exam reveals no gallop and no friction rub.  No murmur heard. Pulmonary/Chest: Effort normal. No respiratory distress. He has wheezes. He has no rales.  Skin: Skin is warm and dry.     Raised scaly lesion on upper right buttocks near intergluteal cleft.  Nursing note and vitals reviewed.     Assessment & Plan:  1. Essential hypertension Continue low sodium diet. May need additional BP coverage if BP remains elevated.  Smoking cessation.  - amLODipine (NORVASC) 10 MG tablet; Take 1 tablet (10 mg total) by mouth daily.  Dispense: 90 tablet; Refill: 1  2. 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 - Ambulatory referral to Dermatology  3. Cutaneous abscess of other site Warm compress to right ear.  Take full course of antibiotics. May need drainage in the future.  - doxycycline (VIBRAMYCIN) 100 MG capsule; Take 1 capsule (100 mg total) by mouth 2 (two) times daily.  Dispense: 20 capsule; Refill: 0  Merryl Buckels C Marrie Chandra BSN RN NP student

## 2017-06-18 ENCOUNTER — Encounter: Payer: Self-pay | Admitting: Adult Health

## 2017-09-24 ENCOUNTER — Ambulatory Visit: Payer: BLUE CROSS/BLUE SHIELD | Admitting: Adult Health

## 2017-09-24 ENCOUNTER — Encounter: Payer: Self-pay | Admitting: Adult Health

## 2017-09-24 DIAGNOSIS — I10 Essential (primary) hypertension: Secondary | ICD-10-CM

## 2017-09-24 MED ORDER — AMLODIPINE BESYLATE 10 MG PO TABS: 10.0000 mg | ORAL_TABLET | Freq: Every day | ORAL | 2 refills | Status: DC

## 2017-09-24 NOTE — Progress Notes (Signed)
Subjective:    Patient ID: Matthew Savage, male    DOB: 1960-08-05, 57 y.o.   MRN: [id number]  HPI  57 year old male who presents to the office today for follow up regarding hypertension. He was last seen in February and at that time his Norvasc was increased from 5 mg to 10 mg. He was asked to follow up in 1 month for repeats check. He reports that he went on a long motorcycle trip up to [place] and has been having to fly back and forth between [place] for work   He reports no side effects of the increase in Norvasc.   He has not been checking his blood pressure at home   Denies CP or SOB, or increasing edema in lower extremities ( has chronic edema).    BP Readings from Last 3 Encounters:  09/24/17 132/84  06/15/17 (!) 142/90  05/25/17 (!) 153/90    Review of Systems See HPI   Past Medical History:  Diagnosis Date  . Blood in urine   . DDD (degenerative disc disease)   . DVT (deep venous thrombosis) ([place]) 2017 & 2018  . Gout   . Heart murmur   . Hyperlipidemia   . Hypertension   . Numbness and tingling    right arm and hand  . SCC (squamous cell carcinoma)    skin, squamous cell, face    Social History   Socioeconomic History  . Marital status: Divorced    Spouse name: Not on file  . Number of children: Not on file  . Years of education: Not on file  . Highest education level: Not on file  Occupational History  . Not on file  Social Needs  . Financial resource strain: Not on file  . Food insecurity:    Worry: Not on file    Inability: Not on file  . Transportation needs:    Medical: Not on file    Non-medical: Not on file  Tobacco Use  . Smoking status: Current Every Day Smoker    Packs/day: 2.00    Years: 36.00    Pack years: 72.00    Types: Cigarettes  . Smokeless tobacco: Never Used  Substance and Sexual Activity  . Alcohol use: Yes    Alcohol/week: 6.0 oz    Types: 12 Standard drinks or equivalent per week    Comment: 2 per day  . Drug use: No   . Sexual activity: Not on file  Lifestyle  . Physical activity:    Days per week: Not on file    Minutes per session: Not on file  . Stress: Not on file  Relationships  . Social connections:    Talks on phone: Not on file    Gets together: Not on file    Attends religious service: Not on file    Active member of club or organization: Not on file    Attends meetings of clubs or organizations: Not on file    Relationship status: Not on file  . Intimate partner violence:    Fear of current or ex partner: Not on file    Emotionally abused: Not on file    Physically abused: Not on file    Forced sexual activity: Not on file  Other Topics Concern  . 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    Married    No kids    Past Surgical History:  Procedure Laterality Date  . HERNIA REPAIR  1967   X 2  . LAPAROSCOPIC GASTRIC BANDING  03/2009   in [place], [place]  . MASS EXCISION  12/03/2011   X 2 (gluteal) X 1 (lower back)  . MOHS SURGERY     x 2    Family History  Problem Relation Age of Onset  . Breast cancer Mother   . Other Mother        small cell carcinoma  . Hypertension Father   . Colon polyps Father   . Colon polyps Brother     No Known Allergies  Current Outpatient Medications on File Prior to Visit  Medication Sig Dispense Refill  . ibuprofen (ADVIL,MOTRIN) 200 MG tablet Take 800 mg by mouth every 6 (six) hours as needed.      Marland Kitchen lisinopril (PRINIVIL,ZESTRIL) 40 MG tablet Take 1 tablet (40 mg total) by mouth daily. 90 tablet 3  . loratadine (CLARITIN) 10 MG tablet Take 10 mg by mouth daily.    . Multiple Vitamin (MULTIVITAMIN) tablet Take 1 tablet by mouth daily.      . rivaroxaban (XARELTO) 20 MG TABS tablet Take 20 mg by mouth daily with supper.     Current Facility-Administered Medications on File Prior to Visit  Medication Dose Route Frequency Provider Last Rate Last Dose  . 0.9 %  sodium chloride infusion  500 mL  Intravenous Once Pyrtle, Lajuan Lines, MD        BP 132/84   Temp 98.3 F (36.8 C) (Oral)   Wt 275 lb (124.7 kg)   BMI 37.30 kg/m       Objective:   Physical Exam  Constitutional: He is oriented to person, place, and time. He appears well-developed and well-nourished. No distress.  Cardiovascular: Normal rate, regular rhythm, normal heart sounds and intact distal pulses. Exam reveals no gallop and no friction rub.  No murmur heard. Pulmonary/Chest: Effort normal and breath sounds normal. No stridor. No respiratory distress. He has no wheezes. He has no rales. He exhibits no tenderness.  Musculoskeletal: He exhibits edema (mild non pitting edema in bilateral lower extremities ).  Neurological: He is alert and oriented to person, place, and time.  Skin: Skin is warm and dry. Capillary refill takes less than 2 seconds. He is not diaphoretic.  Psychiatric: He has a normal mood and affect. His behavior is normal. Judgment and thought content normal.  Nursing note and vitals reviewed.     Assessment & Plan:  1. Essential hypertension - BP better controlled. No change in medication at this time  - amLODipine (NORVASC) 10 MG tablet; Take 1 tablet (10 mg total) by mouth daily.  Dispense: 90 tablet; Refill: 2   Dorothyann Peng, NP

## 2017-10-15 ENCOUNTER — Telehealth: Payer: Self-pay | Admitting: Adult Health

## 2017-10-15 NOTE — Telephone Encounter (Signed)
Copied from [place] [id number]. Topic: Referral - Request >> Oct 15, 2017 10:00 AM Gardiner Ramus wrote: CRM for notification. See Telephone encounter for: 10/15/17.  Reason for CRM: Pt called and stated that he went to [facility] Before and does not want to go back. Could you please refer to a different location. Please Advise. Cb#[phone]

## 2017-10-18 NOTE — Telephone Encounter (Signed)
Deb/Sheena, can you send referral to another location please?

## 2017-11-01 NOTE — Telephone Encounter (Signed)
Has this been taken care of ?  ?Thanks  ? ? ?

## 2017-11-05 NOTE — Telephone Encounter (Signed)
Referral has been changed to [facility]. They will contact pt to schedule.

## 2017-12-07 ENCOUNTER — Telehealth: Payer: Self-pay | Admitting: Adult Health

## 2017-12-07 NOTE — Telephone Encounter (Signed)
Please advise  Notes from 09/24/17 OV Assessment & Plan:  1. Essential hypertension - BP better controlled. No change in medication at this time  - amLODipine (NORVASC) 10 MG tablet; Take 1 tablet (10 mg total) by mouth daily.  Dispense: 90 tablet; Refill: 2

## 2017-12-07 NOTE — Telephone Encounter (Signed)
40 mg of lisinopril

## 2017-12-07 NOTE — Telephone Encounter (Signed)
Copied from [place] [id number]. Topic: Quick Communication - See Telephone Encounter >> Dec 07, 2017 10:14 AM Vernona Rieger wrote: CRM for notification. See Telephone encounter for: 12/07/17.  Patient said he does not know how much he is suppose to be taking of his lisinopril (PRINIVIL,ZESTRIL) 40 MG tablet. Call back [phone],

## 2017-12-08 NOTE — Telephone Encounter (Signed)
lmomtcb x 1 for the pt to make him aware of the dosing and instructions of the lisinopril.

## 2017-12-09 NOTE — Telephone Encounter (Signed)
lmomtcb x 2 for the pt to advise him of dosing of medication.  He should be taking the lisinopril 40 mg  Once daily.   We can send in a refill if needed.

## 2017-12-09 NOTE — Telephone Encounter (Signed)
Pt states that the bottle says to take 1/2 tablet which would be 20mg  but he went by the doctors instructions and has been taking a whole pill. He is now out of medication and is needing a refill.  [id number] - [place], [place] - [address] [phone] (Phone) [phone] (Fax)

## 2017-12-10 ENCOUNTER — Other Ambulatory Visit: Payer: Self-pay | Admitting: Adult Health

## 2017-12-10 ENCOUNTER — Encounter: Payer: Self-pay | Admitting: *Deleted

## 2017-12-10 ENCOUNTER — Other Ambulatory Visit: Payer: Self-pay | Admitting: *Deleted

## 2017-12-10 DIAGNOSIS — I10 Essential (primary) hypertension: Secondary | ICD-10-CM

## 2017-12-10 MED ORDER — LISINOPRIL 40 MG PO TABS: 40.0000 mg | ORAL_TABLET | Freq: Every day | ORAL | 3 refills | Status: DC

## 2017-12-10 NOTE — Telephone Encounter (Signed)
My chart message has been sent to the pt since we have not been able to contact him via phone.  Refill of the lisinopril has been sent to his pharmacy

## 2018-03-23 ENCOUNTER — Encounter: Payer: Self-pay | Admitting: Adult Health

## 2018-03-23 ENCOUNTER — Ambulatory Visit (INDEPENDENT_AMBULATORY_CARE_PROVIDER_SITE_OTHER): Payer: BLUE CROSS/BLUE SHIELD

## 2018-03-23 ENCOUNTER — Ambulatory Visit: Payer: BLUE CROSS/BLUE SHIELD | Admitting: Adult Health

## 2018-03-23 VITALS — BP 130/84 | Temp 97.8°F | Wt 283.0 lb

## 2018-03-23 DIAGNOSIS — L0231 Cutaneous abscess of buttock: Secondary | ICD-10-CM | POA: Diagnosis not present

## 2018-03-23 DIAGNOSIS — M79604 Pain in right leg: Secondary | ICD-10-CM

## 2018-03-23 DIAGNOSIS — Z760 Encounter for issue of repeat prescription: Secondary | ICD-10-CM | POA: Diagnosis not present

## 2018-03-23 MED ORDER — RIVAROXABAN 20 MG PO TABS: 20.0000 mg | ORAL_TABLET | Freq: Every day | ORAL | 3 refills | Status: DC

## 2018-03-23 MED ORDER — AMLODIPINE BESYLATE 10 MG PO TABS: 10.0000 mg | ORAL_TABLET | Freq: Every day | ORAL | 3 refills | Status: DC

## 2018-03-23 MED ORDER — DOXYCYCLINE HYCLATE 100 MG PO CAPS: 100.0000 mg | ORAL_CAPSULE | Freq: Two times a day (BID) | ORAL | 0 refills | Status: DC

## 2018-03-23 NOTE — Progress Notes (Signed)
Subjective:    Patient ID: Matthew Savage, male    DOB: [birth year], 57 y.o.   MRN: [id number]  HPI  57 year old male who  has a past medical history of Blood in urine, DDD (degenerative disc disease), DVT (deep venous thrombosis) ([place]) (2017 & 2018), Gout, Heart murmur, Hyperlipidemia, Hypertension, Numbness and tingling, and SCC (squamous cell carcinoma).  Presents to the office today for multiple complaints.  1. Medication refill - Norvasc and Xarelto   2.  Right leg pain.  Started approximately 1 week ago.  Is worse with walking and palpation.  He denies any trauma and the only thing he can think of that caused the pain was when he was kneeling on his right leg at work.  Pain is located below the knee on the tibia.  Denies any bruising, redness, warmth, or edema.  Orts earlier this week the pain seemed to be improving last night he slept then chair and when he woke up the pain was worse.  Been using Tylenol at home for pain relief  2.  Assessed on left buttock.  This been present for approximately 1 week ports that pain has improved as has the redness, warmth, and swelling.  He has not had any drainage or discharge from the abscess  Review of Systems See HPI   Past Medical History:  Diagnosis Date  . Blood in urine   . DDD (degenerative disc disease)   . DVT (deep venous thrombosis) ([place]) 2017 & 2018  . Gout   . Heart murmur   . Hyperlipidemia   . Hypertension   . Numbness and tingling    right arm and hand  . SCC (squamous cell carcinoma)    skin, squamous cell, face    Social History   Socioeconomic History  . Marital status: Divorced    Spouse name: Not on file  . Number of children: Not on file  . Years of education: Not on file  . Highest education level: Not on file  Occupational History  . Not on file  Social Needs  . Financial resource strain: Not on file  . Food insecurity:    Worry: Not on file    Inability: Not on file  . Transportation needs:   Medical: Not on file    Non-medical: Not on file  Tobacco Use  . Smoking status: Current Every Day Smoker    Packs/day: 2.00    Years: 36.00    Pack years: 72.00    Types: Cigarettes  . Smokeless tobacco: Never Used  Substance and Sexual Activity  . Alcohol use: Yes    Alcohol/week: 12.0 standard drinks    Types: 12 Standard drinks or equivalent per week    Comment: 2 per day  . Drug use: No  . Sexual activity: Not on file  Lifestyle  . Physical activity:    Days per week: Not on file    Minutes per session: Not on file  . Stress: Not on file  Relationships  . Social connections:    Talks on phone: Not on file    Gets together: Not on file    Attends religious service: Not on file    Active member of club or organization: Not on file    Attends meetings of clubs or organizations: Not on file    Relationship status: Not on file  . Intimate partner violence:    Fear of current or ex partner: Not on file  Emotionally abused: Not on file    Physically abused: Not on file    Forced sexual activity: Not on file  Other Topics Concern  . 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    Married    No kids    Past Surgical History:  Procedure Laterality Date  . HERNIA REPAIR  1967   X 2  . LAPAROSCOPIC GASTRIC BANDING  03/2009   in [place], [place]  . MASS EXCISION  12/03/2011   X 2 (gluteal) X 1 (lower back)  . MOHS SURGERY     x 2    Family History  Problem Relation Age of Onset  . Breast cancer Mother   . Other Mother        small cell carcinoma  . Hypertension Father   . Colon polyps Father   . Colon polyps Brother     No Known Allergies  Current Outpatient Medications on File Prior to Visit  Medication Sig Dispense Refill  . ibuprofen (ADVIL,MOTRIN) 200 MG tablet Take 800 mg by mouth every 6 (six) hours as needed.      Marland Kitchen lisinopril (PRINIVIL,ZESTRIL) 40 MG tablet Take 1 tablet (40 mg total) by mouth daily. 90 tablet 3  .  loratadine (CLARITIN) 10 MG tablet Take 10 mg by mouth daily.    . Multiple Vitamin (MULTIVITAMIN) tablet Take 1 tablet by mouth daily.       Current Facility-Administered Medications on File Prior to Visit  Medication Dose Route Frequency Provider Last Rate Last Dose  . 0.9 %  sodium chloride infusion  500 mL Intravenous Once Pyrtle, Lajuan Lines, MD        BP 130/84   Temp 97.8 F (36.6 C)   Wt 283 lb (128.4 kg)   BMI 38.38 kg/m       Objective:   Physical Exam  Constitutional: He is oriented to person, place, and time. He appears well-developed and well-nourished. No distress.  Cardiovascular: Normal rate, regular rhythm, normal heart sounds and intact distal pulses.  Pulmonary/Chest: Effort normal and breath sounds normal.  Musculoskeletal: Normal range of motion. He exhibits tenderness (Nearness to right tibia, below knee with deep palpation.). He exhibits no edema or deformity.  Neurological: He is alert and oriented to person, place, and time.  Skin: Skin is warm and dry. Capillary refill takes less than 2 seconds. No rash noted. He is not diaphoretic.  Silver dollar sized nonfluctuant abscess noted on left buttock.  No active draining.  No pain with palpation, redness, or warmth noted.  Psychiatric: He has a normal mood and affect. His behavior is normal. Judgment and thought content normal.  Nursing note and vitals reviewed.     Assessment & Plan:  1. Right leg pain - Slight concern for stress fracture but likely bruise. Will get xray to r/o stress fracture.  - Advised ice and rest.  - DG Tibia/Fibula Right; Future - DG Tibia/Fibula Right  2. Abscess of buttock, left  - doxycycline (VIBRAMYCIN) 100 MG capsule; Take 1 capsule (100 mg total) by mouth 2 (two) times daily.  Dispense: 20 capsule; Refill: 0 - Follow up if not resolved by the end of the abx treatment or if abscess comes to head and can be I&D 3. Medication refill  - amLODipine (NORVASC) 10 MG tablet; Take 1  tablet (10 mg total) by mouth daily.  Dispense: 90 tablet; Refill: 3 - rivaroxaban (XARELTO) 20 MG TABS  tablet; Take 1 tablet (20 mg total) by mouth daily with supper.  Dispense: 90 tablet; Refill: 3  Dorothyann Peng, NP

## 2018-07-20 ENCOUNTER — Ambulatory Visit: Payer: Self-pay | Admitting: Adult Health

## 2018-07-20 NOTE — Telephone Encounter (Signed)
Pt. Reports he was exposed to [place] at work in [facility]. Probably1-2 weeks ago. His company has closed for cleaning and sent workers home. He currently has no symptoms. Reviewed home care and quarantine per protocol. Verbalizes understanding. Instructed to call back if he develops symptoms.  Reason for Disposition . [1] COVID-19 EXPOSURE (Close Contact) within last 14 days AND [2] NO cough, fever, or breathing difficulty  Answer Assessment - Initial Assessment Questions 1. CLOSE CONTACT: "Who is the person with the confirmed or suspected COVID-19 infection that you were exposed to?"     Co-worker 2. PLACE of CONTACT: "Where were you when you were exposed to COVID-19?" (e.g., home, school, medical waiting room; which city?)     Works a Music therapist for [employer] 3. TYPE of CONTACT: "How much contact was there?" (e.g., sitting next to, live in same house, work in same office, same building)     Same building 4. DURATION of CONTACT: "How long were you in contact with the COVID-19 patient?" (e.g., a few seconds, passed by person, a few minutes, live with the patient)     Unsure 5. DATE of CONTACT: "When did you have contact with a COVID-19 patient?" (e.g., how many days ago)     March 20-24 6. TRAVEL: "Have you traveled out of the country recently?" If so, "When and where?"     * Also ask about out-of-state travel, since the CDC has identified some high risk cities for community spread in the [place].     * Note: Travel becomes less relevant if there is widespread community transmission where the patient lives.     [place] 7. COMMUNITY SPREAD: "Are there lots of cases or COVID-19 (community spread) where you live?" (See public health department website, if unsure)   * MAJOR community spread: high number of cases; numbers of cases are increasing; many people hospitalized.   * MINOR community spread: low number of cases; not increasing; few or no people hospitalized     Minor 8. SYMPTOMS: "Do  you have any symptoms?" (e.g., fever, cough, breathing difficulty)     No 9. PREGNANCY OR POSTPARTUM: "Is there any chance you are pregnant?" "When was your last menstrual period?" "Did you deliver in the last 2 weeks?"     n/a 10. HIGH RISK: "Do you have any heart or lung problems? Do you have a weak immune system?" (e.g., CHF, COPD, asthma, HIV positive, chemotherapy, renal failure, diabetes mellitus, sickle cell anemia)       HTN  Protocols used: CORONAVIRUS (COVID-19) EXPOSURE-A-AH

## 2018-09-27 ENCOUNTER — Encounter: Payer: Self-pay | Admitting: Adult Health

## 2018-09-28 ENCOUNTER — Other Ambulatory Visit: Payer: Self-pay

## 2018-09-28 ENCOUNTER — Ambulatory Visit (INDEPENDENT_AMBULATORY_CARE_PROVIDER_SITE_OTHER): Payer: BC Managed Care – PPO | Admitting: Adult Health

## 2018-09-28 ENCOUNTER — Encounter: Payer: Self-pay | Admitting: Adult Health

## 2018-09-28 DIAGNOSIS — M10472 Other secondary gout, left ankle and foot: Secondary | ICD-10-CM | POA: Diagnosis not present

## 2018-09-28 MED ORDER — PREDNISONE 10 MG PO TABS: ORAL_TABLET | ORAL | 0 refills | Status: DC

## 2018-09-28 NOTE — Progress Notes (Signed)
Virtual Visit via Video Note  I connected with Matthew Savage on 09/28/18 at 11:30 AM EDT by a video enabled telemedicine application and verified that I am speaking with the correct person using two identifiers.  Location patient: home Location provider:work or home office Persons participating in the virtual visit: patient, provider  I discussed the limitations of evaluation and management by telemedicine and the availability of in person appointments. The patient expressed understanding and agreed to proceed.   HPI: 58 year old male who is being evaluated today for an acute issue of possible gout flare.  Reports symptoms started 2 days ago.  Reports pain, redness, warmth, and swelling of the left great toe.  Walking causes increased discomfort.  He did eat chicken liver late last week and also drinks beer on a nightly basis.  Denies streaking up left foot or leg, fevers, or chills.    ROS: See pertinent positives and negatives per HPI.  Past Medical History:  Diagnosis Date  . Blood in urine   . DDD (degenerative disc disease)   . DVT (deep venous thrombosis) ([place]) 2017 & 2018  . Gout   . Heart murmur   . Hyperlipidemia   . Hypertension   . Numbness and tingling    right arm and hand  . SCC (squamous cell carcinoma)    skin, squamous cell, face    Past Surgical History:  Procedure Laterality Date  . HERNIA REPAIR  1967   X 2  . LAPAROSCOPIC GASTRIC BANDING  03/2009   in [place], [place]  . MASS EXCISION  12/03/2011   X 2 (gluteal) X 1 (lower back)  . MOHS SURGERY     x 2    Family History  Problem Relation Age of Onset  . Breast cancer Mother   . Other Mother        small cell carcinoma  . Hypertension Father   . Colon polyps Father   . Colon polyps Brother      Current Outpatient Medications:  .  amLODipine (NORVASC) 10 MG tablet, Take 1 tablet (10 mg total) by mouth daily., Disp: 90 tablet, Rfl: 3 .  doxycycline (VIBRAMYCIN) 100 MG capsule, Take 1 capsule (100  mg total) by mouth 2 (two) times daily., Disp: 20 capsule, Rfl: 0 .  ibuprofen (ADVIL,MOTRIN) 200 MG tablet, Take 800 mg by mouth every 6 (six) hours as needed.  , Disp: , Rfl:  .  lisinopril (PRINIVIL,ZESTRIL) 40 MG tablet, Take 1 tablet (40 mg total) by mouth daily., Disp: 90 tablet, Rfl: 3 .  loratadine (CLARITIN) 10 MG tablet, Take 10 mg by mouth daily., Disp: , Rfl:  .  Multiple Vitamin (MULTIVITAMIN) tablet, Take 1 tablet by mouth daily.  , Disp: , Rfl:  .  predniSONE (DELTASONE) 10 MG tablet, 40 mg x 3 days, 20 mg x 3 days, 10 mg x 3 days, Disp: 21 tablet, Rfl: 0 .  rivaroxaban (XARELTO) 20 MG TABS tablet, Take 1 tablet (20 mg total) by mouth daily with supper., Disp: 90 tablet, Rfl: 3  Current Facility-Administered Medications:  .  0.9 %  sodium chloride infusion, 500 mL, Intravenous, Once, Pyrtle, Lajuan Lines, MD  EXAM:  VITALS per patient if applicable:  GENERAL: alert, oriented, appears well and in no acute distress  HEENT: atraumatic, conjunttiva clear, no obvious abnormalities on inspection of external nose and ears  NECK: normal movements of the head and neck  LUNGS: on inspection no signs of respiratory distress, breathing rate appears normal, no  obvious gross SOB, gasping or wheezing  CV: no obvious cyanosis  MS: moves all visible extremities without noticeable abnormality  SKIN: Trace redness edema noted throughout left great toe.  PSYCH/NEURO: pleasant and cooperative, no obvious depression or anxiety, speech and thought processing grossly intact  ASSESSMENT AND PLAN:  Acute gout due to other secondary cause involving toe of left foot - Plan: predniSONE (DELTASONE) 10 MG tablet  Discussed the following assessment and plan: Exam consistent with acute gout flare.  Will treat with prednisone taper.  Advise follow-up if no improvement.  We reviewed dietary choices to prevent gout flares    I discussed the assessment and treatment plan with the patient. The patient was  provided an opportunity to ask questions and all were answered. The patient agreed with the plan and demonstrated an understanding of the instructions.   The patient was advised to call back or seek an in-person evaluation if the symptoms worsen or if the condition fails to improve as anticipated.   Dorothyann Peng, NP

## 2018-10-04 ENCOUNTER — Telehealth: Payer: Self-pay

## 2018-10-04 NOTE — Telephone Encounter (Signed)
Copied from [place] [id number]. Topic: General - Other >> Oct 04, 2018 11:25 AM Celene Kras A wrote: Reason for CRM: Pt called stating his gout has been feeling better with the medication, but pt states he is now working and his work boots are making it flare up again. Please advise.

## 2018-10-05 MED ORDER — PREDNISONE 20 MG PO TABS: 20.0000 mg | ORAL_TABLET | Freq: Every day | ORAL | 0 refills | Status: DC

## 2018-10-05 NOTE — Telephone Encounter (Signed)
Spoke to patient and his gout flare is improving but he continues to have pain. He has one dose of prednisone left for tomorrow. He has been taking Ibuprofen to help with the pain    Will send in 5 more days of prednisone. Advised he should not take NSAIDS d/t being on Xarelto.

## 2018-10-13 ENCOUNTER — Ambulatory Visit (INDEPENDENT_AMBULATORY_CARE_PROVIDER_SITE_OTHER): Payer: BC Managed Care – PPO | Admitting: Adult Health

## 2018-10-13 ENCOUNTER — Encounter: Payer: Self-pay | Admitting: Adult Health

## 2018-10-13 ENCOUNTER — Other Ambulatory Visit: Payer: Self-pay

## 2018-10-13 DIAGNOSIS — M79675 Pain in left toe(s): Secondary | ICD-10-CM | POA: Diagnosis not present

## 2018-10-13 MED ORDER — DOXYCYCLINE HYCLATE 100 MG PO CAPS: 100.0000 mg | ORAL_CAPSULE | Freq: Two times a day (BID) | ORAL | 0 refills | Status: DC

## 2018-10-13 MED ORDER — PREDNISONE 20 MG PO TABS: 20.0000 mg | ORAL_TABLET | Freq: Every day | ORAL | 0 refills | Status: DC

## 2018-10-13 NOTE — Progress Notes (Signed)
Virtual Visit via Video Note  I connected with Bess Harvest on 10/13/18 at  4:00 PM EDT by a video enabled telemedicine application and verified that I am speaking with the correct person using two identifiers.  Location patient: home Location provider:work or home office Persons participating in the virtual visit: patient, provider  I discussed the limitations of evaluation and management by telemedicine and the availability of in person appointments. The patient expressed understanding and agreed to proceed.   HPI: 58 year old male who is being evaluated today for follow-up regarding concern of possible gout flare in his left great toe.  He finished his second round of prednisone on Monday and reports that when he was taking the prednisone both times pain resolved but about 24 hours after finishing his course, pain, redness, and warmth turned.  Feels as though the pain is worse currently than it has been in the past.  He denies any streaking.   ROS: See pertinent positives and negatives per HPI.  Past Medical History:  Diagnosis Date  . Blood in urine   . DDD (degenerative disc disease)   . DVT (deep venous thrombosis) ([place]) 2017 & 2018  . Gout   . Heart murmur   . Hyperlipidemia   . Hypertension   . Numbness and tingling    right arm and hand  . SCC (squamous cell carcinoma)    skin, squamous cell, face    Past Surgical History:  Procedure Laterality Date  . HERNIA REPAIR  1967   X 2  . LAPAROSCOPIC GASTRIC BANDING  03/2009   in [place], [place]  . MASS EXCISION  12/03/2011   X 2 (gluteal) X 1 (lower back)  . MOHS SURGERY     x 2    Family History  Problem Relation Age of Onset  . Breast cancer Mother   . Other Mother        small cell carcinoma  . Hypertension Father   . Colon polyps Father   . Colon polyps Brother       Current Outpatient Medications:  .  amLODipine (NORVASC) 10 MG tablet, Take 1 tablet (10 mg total) by mouth daily., Disp: 90 tablet, Rfl: 3 .   doxycycline (VIBRAMYCIN) 100 MG capsule, Take 1 capsule (100 mg total) by mouth 2 (two) times daily., Disp: 20 capsule, Rfl: 0 .  ibuprofen (ADVIL,MOTRIN) 200 MG tablet, Take 800 mg by mouth every 6 (six) hours as needed.  , Disp: , Rfl:  .  lisinopril (PRINIVIL,ZESTRIL) 40 MG tablet, Take 1 tablet (40 mg total) by mouth daily., Disp: 90 tablet, Rfl: 3 .  loratadine (CLARITIN) 10 MG tablet, Take 10 mg by mouth daily., Disp: , Rfl:  .  Multiple Vitamin (MULTIVITAMIN) tablet, Take 1 tablet by mouth daily.  , Disp: , Rfl:  .  predniSONE (DELTASONE) 20 MG tablet, Take 1 tablet (20 mg total) by mouth daily with breakfast., Disp: 5 tablet, Rfl: 0 .  rivaroxaban (XARELTO) 20 MG TABS tablet, Take 1 tablet (20 mg total) by mouth daily with supper., Disp: 90 tablet, Rfl: 3  Current Facility-Administered Medications:  .  0.9 %  sodium chloride infusion, 500 mL, Intravenous, Once, Pyrtle, Lajuan Lines, MD  EXAM:  VITALS per patient if applicable:  GENERAL: alert, oriented, appears well and in no acute distress  HEENT: atraumatic, conjunttiva clear, no obvious abnormalities on inspection of external nose and ears  NECK: normal movements of the head and neck  LUNGS: on inspection no signs of  respiratory distress, breathing rate appears normal, no obvious gross SOB, gasping or wheezing  CV: no obvious cyanosis  MS: moves all visible extremities without noticeable abnormality  PSYCH/NEURO: pleasant and cooperative, no obvious depression or anxiety, speech and thought processing grossly intact  SKIN: Noticeable swelling and redness to the left great toe  ASSESSMENT AND PLAN:  Discussed the following assessment and plan:  1. Great toe pain, left -Cannot rule out gout flare but cannot rule out cellulitis either.  Will treat again for both.  Advised no drinking alcohol while taking this medication.  Follow-up in office if this does not resolve and consider referral to podiatry - predniSONE (DELTASONE) 20  MG tablet; Take 1 tablet (20 mg total) by mouth daily with breakfast.  Dispense: 7 tablet; Refill: 0 - doxycycline (VIBRAMYCIN) 100 MG capsule; Take 1 capsule (100 mg total) by mouth 2 (two) times daily.  Dispense: 20 capsule; Refill: 0     I discussed the assessment and treatment plan with the patient. The patient was provided an opportunity to ask questions and all were answered. The patient agreed with the plan and demonstrated an understanding of the instructions.   The patient was advised to call back or seek an in-person evaluation if the symptoms worsen or if the condition fails to improve as anticipated.   Dorothyann Peng, NP

## 2018-10-17 ENCOUNTER — Encounter: Payer: Self-pay | Admitting: Adult Health

## 2018-10-18 ENCOUNTER — Other Ambulatory Visit: Payer: Self-pay | Admitting: Adult Health

## 2018-10-18 DIAGNOSIS — M79675 Pain in left toe(s): Secondary | ICD-10-CM

## 2018-10-19 ENCOUNTER — Other Ambulatory Visit: Payer: Self-pay

## 2018-10-19 ENCOUNTER — Encounter: Payer: Self-pay | Admitting: Podiatry

## 2018-10-19 ENCOUNTER — Other Ambulatory Visit: Payer: Self-pay | Admitting: Podiatry

## 2018-10-19 ENCOUNTER — Ambulatory Visit (INDEPENDENT_AMBULATORY_CARE_PROVIDER_SITE_OTHER): Payer: BC Managed Care – PPO | Admitting: Podiatry

## 2018-10-19 ENCOUNTER — Ambulatory Visit (INDEPENDENT_AMBULATORY_CARE_PROVIDER_SITE_OTHER): Payer: BC Managed Care – PPO

## 2018-10-19 ENCOUNTER — Encounter

## 2018-10-19 VITALS — Temp 97.7°F

## 2018-10-19 DIAGNOSIS — M109 Gout, unspecified: Secondary | ICD-10-CM | POA: Diagnosis not present

## 2018-10-19 DIAGNOSIS — M7752 Other enthesopathy of left foot: Secondary | ICD-10-CM

## 2018-10-19 MED ORDER — COLCHICINE 0.6 MG PO TABS: 0.6000 mg | ORAL_TABLET | Freq: Every day | ORAL | 0 refills | Status: DC

## 2018-10-20 NOTE — Telephone Encounter (Signed)
Pt should wait to see physician at next scheduled appt 11/09/2018

## 2018-10-26 NOTE — Progress Notes (Signed)
   HPI: 58 year old male presenting today as a new patient with a chief complaint of left great toe pain that began about one month ago. He was seen by his PCP, diagnosed with gout and prescribed Doxycycline and Prednisone which provided no significant relief. Walking and wearing shoes increases the pain. Patient is here for further evaluation and treatment.   Past Medical History:  Diagnosis Date  . Blood in urine   . DDD (degenerative disc disease)   . DVT (deep venous thrombosis) ([place]) 2017 & 2018  . Gout   . Heart murmur   . Hyperlipidemia   . Hypertension   . Numbness and tingling    right arm and hand  . SCC (squamous cell carcinoma)    skin, squamous cell, face     Physical Exam: General: The patient is alert and oriented x3 in no acute distress.  Dermatology: Skin is warm, dry and supple bilateral lower extremities. Negative for open lesions or macerations.  Vascular: Palpable pedal pulses bilaterally. Capillary refill within normal limits.  Neurological: Epicritic and protective threshold grossly intact bilaterally.   Musculoskeletal Exam: Pain on palpation to the left 1st MPJ with erythema and edema. Range of motion within normal limits to all pedal and ankle joints bilateral. Muscle strength 5/5 in all groups bilateral.   Radiographic Exam:  Normal osseous mineralization. Joint spaces preserved. No fracture/dislocation/boney destruction.    Assessment: 1. Gout / 1st MPJ capsulitis left  2. H/o recurrent ingrown nails   Plan of Care:  1. Patient evaluated. X-Rays reviewed.  2. Injection of 0.5 mLs Cel[place] Soluspan injected into the 1st MPJ of the left foot.  3. Prescription for Colcrys 0.6 mg #30 provided to patient.  4. Prescription for Allopurinol 100 mg daily provided to patient.  5. Return to clinic in 3 weeks. We will then address ingrown nails at that time.       Edrick Kins, DPM [facility]  Dr. Edrick Kins, DPM    [address], [place] [address]                Office [phone]  Fax [phone]

## 2018-11-09 ENCOUNTER — Encounter: Payer: Self-pay | Admitting: Podiatry

## 2018-11-09 ENCOUNTER — Other Ambulatory Visit: Payer: Self-pay

## 2018-11-09 ENCOUNTER — Ambulatory Visit (INDEPENDENT_AMBULATORY_CARE_PROVIDER_SITE_OTHER): Payer: BC Managed Care – PPO | Admitting: Podiatry

## 2018-11-09 DIAGNOSIS — L989 Disorder of the skin and subcutaneous tissue, unspecified: Secondary | ICD-10-CM

## 2018-11-09 DIAGNOSIS — M109 Gout, unspecified: Secondary | ICD-10-CM

## 2018-11-09 DIAGNOSIS — M21622 Bunionette of left foot: Secondary | ICD-10-CM

## 2018-11-09 DIAGNOSIS — M21621 Bunionette of right foot: Secondary | ICD-10-CM

## 2018-11-09 DIAGNOSIS — M7752 Other enthesopathy of left foot: Secondary | ICD-10-CM | POA: Diagnosis not present

## 2018-11-17 ENCOUNTER — Ambulatory Visit: Payer: Self-pay | Admitting: Adult Health

## 2018-11-17 ENCOUNTER — Encounter: Payer: Self-pay | Admitting: Adult Health

## 2018-11-17 NOTE — Progress Notes (Signed)
   HPI: 58 year old male presenting today for follow up evaluation of gout of the left 1st MPJ. He states he has improved significantly. He has been taking the Colcrys and Allopurinol as directed. There are no worsening factors noted. Patient is here for further evaluation and treatment.   Past Medical History:  Diagnosis Date  . Blood in urine   . DDD (degenerative disc disease)   . DVT (deep venous thrombosis) ([place]) 2017 & 2018  . Gout   . Heart murmur   . Hyperlipidemia   . Hypertension   . Numbness and tingling    right arm and hand  . SCC (squamous cell carcinoma)    skin, squamous cell, face     Physical Exam: General: The patient is alert and oriented x3 in no acute distress.  Dermatology: Hyperkeratotic lesion present on the bilateral sub-fifth MPJs. Pain on palpation with a central nucleated core noted. Skin is warm, dry and supple bilateral lower extremities. Negative for open lesions or macerations.  Vascular: Palpable pedal pulses bilaterally. Capillary refill within normal limits.  Neurological: Epicritic and protective threshold grossly intact bilaterally.   Musculoskeletal Exam: Clinical evidence of Tailor's bunion deformity noted to the respective foot. There is a moderate pain on palpation range of motion of the fifth MPJ. Range of motion within normal limits to all pedal and ankle joints bilateral. Muscle strength 5/5 in all groups bilateral.   Assessment: 1. Gout / 1st MPJ capsulitis left - resolved  2. H/o recurrent ingrown nails - asymptomatic  3. Tailor's bunion deformity bilateral  4. Porokeratosis bilateral sub-fifth MPJs   Plan of Care:  1. Patient evaluated.  2. Excisional debridement of keratotic lesion using a chisel blade was performed without incident. Salinocaine applied and light dressing placed. 3. Discussed Tailor's bunion surgery. Patient would like to have it done when work slows.  4. Recommended OTC corn and callus remover.  5. 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      Edrick Kins, DPM [facility]  Dr. Edrick Kins, DPM    [address], [place] [address]                Office [phone]  Fax [phone]

## 2018-11-17 NOTE — Telephone Encounter (Signed)
Pt scheduled for OV.  

## 2018-11-17 NOTE — Telephone Encounter (Signed)
Pt. Reports he started having abdominal pain yesterday. Left lower quadrant. States he has had a hernia in the past and "it feels kind of like that." No fever, vomiting or diarrhea." Warm transfer to Tammy in the practice.  Answer Assessment - Initial Assessment Questions 1. LOCATION: "Where does it hurt?"      Left lower quad.  2. RADIATION: "Does the pain shoot anywhere else?" (e.g., chest, back)     YES 3. ONSET: "When did the pain begin?" (Minutes, hours or days ago)      Yesterday 4. SUDDEN: "Gradual or sudden onset?"     Gradual 5. PATTERN "Does the pain come and go, or is it constant?"    - If constant: "Is it getting better, staying the same, or worsening?"      (Note: Constant means the pain never goes away completely; most serious pain is constant and it progresses)     - If intermittent: "How long does it last?" "Do you have pain now?"     (Note: Intermittent means the pain goes away completely between bouts)     Constant 6. SEVERITY: "How bad is the pain?"  (e.g., Scale 1-10; mild, moderate, or severe)    - MILD (1-3): doesn't interfere with normal activities, abdomen soft and not tender to touch     - MODERATE (4-7): interferes with normal activities or awakens from sleep, tender to touch     - SEVERE (8-10): excruciating pain, doubled over, unable to do any normal activities       6-7 7. RECURRENT SYMPTOM: "Have you ever had this type of abdominal pain before?" If so, ask: "When was the last time?" and "What happened that time?"      yes 8. CAUSE: "What do you think is causing the abdominal pain?"     Unsure 9. RELIEVING/AGGRAVATING FACTORS: "What makes it better or worse?" (e.g., movement, antacids, bowel movement)     No 10. OTHER SYMPTOMS: "Has there been any vomiting, diarrhea, constipation, or urine problems?"       No  Protocols used: ABDOMINAL PAIN - MALE-A-AH

## 2018-11-18 ENCOUNTER — Other Ambulatory Visit: Payer: Self-pay

## 2018-11-18 ENCOUNTER — Encounter: Payer: Self-pay | Admitting: Internal Medicine

## 2018-11-18 ENCOUNTER — Ambulatory Visit: Payer: BC Managed Care – PPO | Admitting: Internal Medicine

## 2018-11-18 VITALS — BP 120/84 | HR 73 | Temp 98.3°F | Wt 293.7 lb

## 2018-11-18 DIAGNOSIS — R1032 Left lower quadrant pain: Secondary | ICD-10-CM

## 2018-11-18 NOTE — Progress Notes (Signed)
Acute Office Visit     CC/Reason for Visit: LLQ abdominal pain  HPI: Matthew Savage is a 58 y.o. male who is coming in today for the above mentioned reasons. LLQ abdominal pain started about 2 weeks ago after he climbed under a house. 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 Has been feeling pain in that area when he coughs. No change in bowel habits, no radiculopathy. Had a right groin hernia repair in [place] 2 years ago. As a child (age 89) had "double abdominal wall hernias repaired". He had a gastric sleeve placed and has a large abdominal pannus above the area of pain.   Past Medical/Surgical History: Past Medical History:  Diagnosis Date  . Blood in urine   . DDD (degenerative disc disease)   . DVT (deep venous thrombosis) ([place]) 2017 & 2018  . Gout   . Heart murmur   . Hyperlipidemia   . Hypertension   . Numbness and tingling    right arm and hand  . SCC (squamous cell carcinoma)    skin, squamous cell, face    Past Surgical History:  Procedure Laterality Date  . HERNIA REPAIR  1967   X 2  . LAPAROSCOPIC GASTRIC BANDING  03/2009   in [place], [place]  . MASS EXCISION  12/03/2011   X 2 (gluteal) X 1 (lower back)  . MOHS SURGERY     x 2    Social History:  reports that he has been smoking cigarettes. He has a 72.00 pack-year smoking history. He has never used smokeless tobacco. He reports current alcohol use of about 12.0 standard drinks of alcohol per week. He reports that he does not use drugs.  Allergies: No Known Allergies  Family History:  Family History  Problem Relation Age of Onset  . Breast cancer Mother   . Other Mother        small cell carcinoma  . Hypertension Father   . Colon polyps Father   . Colon polyps Brother      Current Outpatient Medications:  .  amLODipine (NORVASC) 10 MG tablet, Take 1 tablet (10 mg total) by mouth daily., Disp: 90 tablet, Rfl: 3 .  ibuprofen (ADVIL,MOTRIN) 200 MG tablet, Take 800 mg  by mouth every 6 (six) hours as needed.  , Disp: , Rfl:  .  lisinopril (PRINIVIL,ZESTRIL) 40 MG tablet, Take 1 tablet (40 mg total) by mouth daily., Disp: 90 tablet, Rfl: 3 .  loratadine (CLARITIN) 10 MG tablet, Take 10 mg by mouth daily., Disp: , Rfl:  .  Multiple Vitamin (MULTIVITAMIN) tablet, Take 1 tablet by mouth daily.  , Disp: , Rfl:  .  rivaroxaban (XARELTO) 20 MG TABS tablet, Take 1 tablet (20 mg total) by mouth daily with supper., Disp: 90 tablet, Rfl: 3  Current Facility-Administered Medications:  .  0.9 %  sodium chloride infusion, 500 mL, Intravenous, Once, Pyrtle, Lajuan Lines, MD  Review of Systems:  Constitutional: Denies fever, chills, diaphoresis, appetite change and fatigue.  HEENT: Denies photophobia, eye pain, redness, hearing loss, ear pain, congestion, sore throat, rhinorrhea, sneezing, mouth sores, trouble swallowing, neck pain, neck stiffness and tinnitus.   Respiratory: Denies SOB, DOE, cough, chest tightness,  and wheezing.   Cardiovascular: Denies chest pain, palpitations and leg swelling.  Gastrointestinal: Denies nausea, vomiting,  diarrhea, constipation, blood in stool and abdominal distention.  Genitourinary: Denies dysuria, urgency, frequency, hematuria, flank pain and difficulty urinating.  Endocrine: Denies: hot or  cold intolerance, sweats, changes in hair or nails, polyuria, polydipsia. Musculoskeletal: Denies myalgias, back pain, joint swelling, arthralgias and gait problem.  Skin: Denies pallor, rash and wound.  Neurological: Denies dizziness, seizures, syncope, weakness, light-headedness, numbness and headaches.  Hematological: Denies adenopathy. Easy bruising, personal or family bleeding history  Psychiatric/Behavioral: Denies suicidal ideation, mood changes, confusion, nervousness, sleep disturbance and agitation    Physical Exam: Vitals:   11/18/18 1323  BP: 120/84  Pulse: 73  Temp: 98.3 F (36.8 C)  TempSrc: Temporal  SpO2: 94%  Weight: 293 lb  11.2 oz (133.2 kg)    Body mass index is 39.83 kg/m.   Constitutional: NAD, calm, comfortable, obese Eyes: PERRL, lids and conjunctivae normal, wears corrective lenses. ENMT: Mucous membranes are moist. Abdomen: large pannus, underneath pannus to the left of the midline I am able to palpate a small mass that gets larger with coughing. +BS Musculoskeletal: no clubbing / cyanosis. No joint deformity upper and lower extremities. Good ROM, no contractures. Normal muscle tone.  Psychiatric: Normal judgment and insight. Alert and oriented x 3. Normal mood.    Impression and Plan:  LLQ abdominal pain -Likely an abdominal wall hernia. -He has had abdominal hernias repaired as a child and a recent right inguinal hernia repair 2 years ago. -Will refer to general surgery for further recommendations.     Lelon Frohlich, MD [facility]

## 2018-11-23 ENCOUNTER — Encounter: Payer: Self-pay | Admitting: Adult Health

## 2018-11-23 ENCOUNTER — Encounter: Payer: Self-pay | Admitting: Internal Medicine

## 2018-12-01 NOTE — Telephone Encounter (Signed)
I called the p. He inform me  that he is aware of his scheduled appt for 12/15/2018 with    [facility], appointment has been made

## 2018-12-10 ENCOUNTER — Other Ambulatory Visit: Payer: Self-pay | Admitting: Podiatry

## 2018-12-12 NOTE — Telephone Encounter (Signed)
Rx Colcrys 0.6mg  send to pharmacy [facility]

## 2019-01-13 ENCOUNTER — Encounter: Payer: Self-pay | Admitting: Family Medicine

## 2019-01-13 ENCOUNTER — Other Ambulatory Visit: Payer: Self-pay | Admitting: Adult Health

## 2019-01-13 NOTE — Telephone Encounter (Signed)
Sent to the pharmacy by e-scribe for 30 days.  Letter released to MyChart.

## 2019-01-16 ENCOUNTER — Encounter: Payer: Self-pay | Admitting: Adult Health

## 2019-01-27 ENCOUNTER — Encounter: Payer: Self-pay | Admitting: Podiatry

## 2019-01-27 ENCOUNTER — Ambulatory Visit (INDEPENDENT_AMBULATORY_CARE_PROVIDER_SITE_OTHER): Payer: BC Managed Care – PPO | Admitting: Podiatry

## 2019-01-27 ENCOUNTER — Other Ambulatory Visit: Payer: Self-pay

## 2019-01-27 DIAGNOSIS — M21621 Bunionette of right foot: Secondary | ICD-10-CM | POA: Diagnosis not present

## 2019-01-27 DIAGNOSIS — M7752 Other enthesopathy of left foot: Secondary | ICD-10-CM | POA: Diagnosis not present

## 2019-01-27 DIAGNOSIS — M109 Gout, unspecified: Secondary | ICD-10-CM

## 2019-01-27 DIAGNOSIS — M21622 Bunionette of left foot: Secondary | ICD-10-CM | POA: Diagnosis not present

## 2019-01-27 NOTE — Patient Instructions (Signed)
Pre-Operative Instructions  Congratulations, you have decided to take an important step towards improving your quality of life.  You can be assured that the doctors and staff at [facility] will be with you every step of the way.  Here are some important things you should know:  1. Plan to be at the surgery center/hospital at least 1 (one) hour prior to your scheduled time, unless otherwise directed by the surgical center/hospital staff.  You must have a responsible adult accompany you, remain during the surgery and drive you home.  Make sure you have directions to the surgical center/hospital to ensure you arrive on time. 2. If you are having surgery at [facility] or [facility] hospitals, you will need a copy of your medical history and physical form from your family physician within one month prior to the date of surgery. We will give you a form for your primary physician to complete.  3. We make every effort to accommodate the date you request for surgery.  However, there are times where surgery dates or times have to be moved.  We will contact you as soon as possible if a change in schedule is required.   4. No aspirin/ibuprofen for one week before surgery.  If you are on aspirin, any non-steroidal anti-inflammatory medications (Mobic, Aleve, Ibuprofen) should not be taken seven (7) days prior to your surgery.  You make take Tylenol for pain prior to surgery.  5. Medications - If you are taking daily heart and blood pressure medications, seizure, reflux, allergy, asthma, anxiety, pain or diabetes medications, make sure you notify the surgery center/hospital before the day of surgery so they can tell you which medications you should take or avoid the day of surgery. 6. No food or drink after midnight the night before surgery unless directed otherwise by surgical center/hospital staff. 7. No alcoholic beverages 24-hours prior to surgery.  No smoking 24-hours prior or 24-hours after  surgery. 8. Wear loose pants or shorts. They should be loose enough to fit over bandages, boots, and casts. 9. Don't wear slip-on shoes. Sneakers are preferred. 10. Bring your boot with you to the surgery center/hospital.  Also bring crutches or a walker if your physician has prescribed it for you.  If you do not have this equipment, it will be provided for you after surgery. 11. If you have not been contacted by the surgery center/hospital by the day before your surgery, call to confirm the date and time of your surgery. 12. Leave-time from work may vary depending on the type of surgery you have.  Appropriate arrangements should be made prior to surgery with your employer. 13. Prescriptions will be provided immediately following surgery by your doctor.  Fill these as soon as possible after surgery and take the medication as directed. Pain medications will not be refilled on weekends and must be approved by the doctor. 14. Remove nail polish on the operative foot and avoid getting pedicures prior to surgery. 15. Wash the night before surgery.  The night before surgery wash the foot and leg well with water and the antibacterial soap provided. Be sure to pay special attention to beneath the toenails and in between the toes.  Wash for at least three (3) minutes. Rinse thoroughly with water and dry well with a towel.  Perform this wash unless told not to do so by your physician.  Enclosed: 1 Ice pack (please put in freezer the night before surgery)   1 Hibiclens skin cleaner     Pre-op instructions  If you have any questions regarding the instructions, please do not hesitate to call our office.  [place]: [address], [place], [place] [address] -- [phone]  [place]: [address] [place], [place] [address] -- [phone]  [place]: [address]  [place], [place] [address] -- [phone]   Website: [email or web address] 

## 2019-01-30 NOTE — Progress Notes (Signed)
   Subjective: 58 y.o. male presenting today for follow up evaluation of painful callus lesions noted to the bilateral sub-fifth MPJs that began hurting again about two weeks ago. Walking and bearing weight increases the pain. He has not had any recent treatment for the symptoms. He would also like to discuss surgery for the Tailor's bunions that cause the calluses. Patient is here for further evaluation and treatment.   Past Medical History:  Diagnosis Date  . Blood in urine   . DDD (degenerative disc disease)   . DVT (deep venous thrombosis) ([place]) 2017 & 2018  . Gout   . Heart murmur   . Hyperlipidemia   . Hypertension   . Numbness and tingling    right arm and hand  . SCC (squamous cell carcinoma)    skin, squamous cell, face     Objective: Physical Exam General: The patient is alert and oriented x3 in no acute distress.  Dermatology: Hyperkeratotic, discolored, thickened, onychodystrophy of bilateral great toenails noted. Skin is cool, dry and supple bilateral lower extremities. Negative for open lesions or macerations.  Vascular: Palpable pedal pulses bilaterally. No edema or erythema noted. Capillary refill within normal limits.  Neurological: Epicritic and protective threshold grossly intact bilaterally.   Musculoskeletal Exam: Clinical evidence of Tailor's bunion deformity noted to the respective foot. There is a moderate pain on palpation range of motion of the fifth MPJ.   Radiographic Exam: Increased intermetatarsal angle to the fourth interspace of the respective foot. Prominent fifth metatarsal head. Joint spaces preserved.   Assessment: 1. Tailor's bunion deformity bilateral  2. Dystrophic nails bilateral great toes   Plan of Care:  1. Patient was evaluated.  2. Today we discussed the conservative versus surgical management of the presenting pathology. The patient opts for surgical management. All possible complications and details of the procedure were  explained. All patient questions were answered. No guarantees were expressed or implied. 3. Authorization for surgery was initiated today. Surgery will consist of Tailor's bunionectomy bilateral; total permanent nail avulsions bilateral great toes.  4. Return to clinic one week post op.    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       Matthew Savage, DPM [facility]  Dr. Edrick Savage, [address]                                        [place], [place] [address]                Office [phone]  Fax [phone]

## 2019-02-03 ENCOUNTER — Telehealth: Payer: Self-pay | Admitting: *Deleted

## 2019-02-03 NOTE — Telephone Encounter (Signed)
DOS 03/23/2019 EXCISION NAIL PERMANENT HALLUX B/L - 11750 AND METATARSAL OSTEOTOMY 5TH B/L - 28308  BCBS: Eligibility Date - 04/21/2015 - 04/19/9998   In-Network    Max Per Benefit Period Year-to-Date Remaining  CoInsurance     Deductible  $850.00 $0.00  Out-Of-Pocket 3  $2,000.00 $943.96   In-Network  [place] Coinsurance Authorization Required  Not Applicable  [id number]  NO - Confirmation # [id number]    I called [employer] to see if authorization was required.  Automated system said it is NOT REQUIRED for the cpt codes.

## 2019-02-03 NOTE — Telephone Encounter (Signed)
Matthew Savage, I am calling you back to let you know that authorization is not required for your surgery.  You have met your deductible, so your insurance will cover 80% and you will be responsible for the other 20%.  "How much will that be?"  I'll ask [name] in our insurance department to give you a call back and give you an estimate.  She has to do the calculations.  "So I'm scheduled for December 3, correct?"  Yes, that is correct, you are scheduled for 03/23/2019.  "Is there anything else I need to do before the surgery?"  No, the only thing you need to do is register via the surgical center's portal.  "I'll take care of it."

## 2019-02-03 NOTE — Telephone Encounter (Signed)
"  I am calling to see when you have me scheduled for surgery."  Do you have a date that you would like?  "I was told you were going to check my insurance and then let me know if it was going to be covered.  Have you done that?"  I was not aware that I was supposed to check your insurance and then call you.  "That's what the doctor told me that he was going to do."  I am sure that he may have told you that but the message was not sent to me.  I apologize for the miscommunication.  I usually schedule a date and then check your the insurance.  If authorization is needed, I insure that it is taken care of prior to the surgery date.  "I'd like to know now.  I don't want any letters on down the road stating I owe this amount."  I will check your insurance and let you know.  Would you like to go ahead and get a date?  "I guess that is fine.  What does he have available?"  He can do it on December 3, 10, or 17, 2020.  "I guess put me down for March 23, 2019."  I'll get it scheduled.  You need to go online and register via the surgical center's online portal.  The instructions are in the brochure that we gave you.

## 2019-02-21 ENCOUNTER — Telehealth: Payer: Self-pay | Admitting: *Deleted

## 2019-02-21 NOTE — Telephone Encounter (Signed)
"  I'm scheduled for surgery on the 3rd of December.  I just checked my insurance company and they said they have not received no paperwork from y'all to approve my surgery.  If you would call me back, I'd appreciate it."

## 2019-02-22 ENCOUNTER — Encounter: Payer: Self-pay | Admitting: Podiatry

## 2019-02-22 NOTE — Telephone Encounter (Signed)
I am returning your call.  Your procedures, cpt codes [phone] x 2 and 11750, do not require authorization.  "I called yesterday and they said they had not received anything from you."  I called when I spoke to you previously.  I have a confirmation number.  "Can you give me that number?"  It is [id number].  I also received a confirmation fax.  "I'll call and see what they say.  I don't want any surprises.

## 2019-02-24 ENCOUNTER — Encounter: Payer: Self-pay | Admitting: Adult Health

## 2019-02-25 ENCOUNTER — Telehealth: Payer: Self-pay | Admitting: *Deleted

## 2019-02-25 NOTE — Telephone Encounter (Signed)
Copied from [place] [phone]. Topic: Appointment Scheduling - Scheduling Inquiry for Clinic >> Feb 24, 2019  5:22 PM Percell Belt A wrote: Reason for CRM: pt called in and per te need to make an appt with cory for left leg pain .  He would like to to be worked in on next Friday when Georgina Snell returns  Best number  [phone]

## 2019-02-27 ENCOUNTER — Other Ambulatory Visit: Payer: Self-pay | Admitting: Podiatry

## 2019-02-27 ENCOUNTER — Other Ambulatory Visit: Payer: Self-pay

## 2019-02-27 ENCOUNTER — Ambulatory Visit (INDEPENDENT_AMBULATORY_CARE_PROVIDER_SITE_OTHER): Payer: BC Managed Care – PPO | Admitting: Podiatry

## 2019-02-27 ENCOUNTER — Other Ambulatory Visit: Payer: Self-pay | Admitting: Adult Health

## 2019-02-27 DIAGNOSIS — M109 Gout, unspecified: Secondary | ICD-10-CM | POA: Diagnosis not present

## 2019-02-27 DIAGNOSIS — M7752 Other enthesopathy of left foot: Secondary | ICD-10-CM | POA: Diagnosis not present

## 2019-02-27 DIAGNOSIS — L989 Disorder of the skin and subcutaneous tissue, unspecified: Secondary | ICD-10-CM

## 2019-02-27 DIAGNOSIS — M21621 Bunionette of right foot: Secondary | ICD-10-CM | POA: Diagnosis not present

## 2019-02-27 DIAGNOSIS — M21622 Bunionette of left foot: Secondary | ICD-10-CM

## 2019-02-27 DIAGNOSIS — Z760 Encounter for issue of repeat prescription: Secondary | ICD-10-CM

## 2019-02-27 MED ORDER — ALLOPURINOL 100 MG PO TABS: 100.0000 mg | ORAL_TABLET | Freq: Every day | ORAL | 6 refills | Status: DC

## 2019-02-28 NOTE — Telephone Encounter (Signed)
Pt scheduled and left a message on identified voicemail.  Advised a call back if further assistance needed.

## 2019-03-01 ENCOUNTER — Telehealth: Payer: Self-pay | Admitting: Podiatry

## 2019-03-01 NOTE — Telephone Encounter (Signed)
Left voicemail to see if pt wanted postop visits to be scheduled in [place] or [place]. Told pt to call me back directly or send a message via MyChart.

## 2019-03-02 NOTE — Telephone Encounter (Signed)
[place] for 90 days of both. I will talk to him tomorrow

## 2019-03-02 NOTE — Progress Notes (Signed)
   Subjective: 58 y.o. male presenting today with a chief complaint of a gout exacerbation that began a few days ago in the 1st MPJ of the left foot. He states the last time he received an injection it helped alleviate the pain and he would like another one. Touching the toe and wearing shoes increases the pain. He has not had any treatment since onset of pain. Patient is here for further evaluation and treatment.   Past Medical History:  Diagnosis Date  . Blood in urine   . DDD (degenerative disc disease)   . DVT (deep venous thrombosis) ([place]) 2017 & 2018  . Gout   . Heart murmur   . Hyperlipidemia   . Hypertension   . Numbness and tingling    right arm and hand  . SCC (squamous cell carcinoma)    skin, squamous cell, face     Objective: Physical Exam General: The patient is alert and oriented x3 in no acute distress.  Dermatology: Hyperkeratotic, discolored, thickened, onychodystrophy of bilateral great toenails noted. Skin is cool, dry and supple bilateral lower extremities. Negative for open lesions or macerations.  Vascular: Palpable pedal pulses bilaterally. No edema or erythema noted. Capillary refill within normal limits.  Neurological: Epicritic and protective threshold grossly intact bilaterally.   Musculoskeletal Exam: Clinical evidence of Tailor's bunion deformity noted to the respective foot. There is a moderate pain on palpation range of motion of the fifth MPJ.   Assessment: 1. Tailor's bunion deformity bilateral  2. Dystrophic nails bilateral great toes 3. H/o recurrent gout attacks 4. Acute gout / 1st MPJ capsulitis left    Plan of Care:  1. Patient was evaluated.  2. Injection of 0.5 mLs Celestone Soluspan injected into the 1st MPJ of the left foot.  3. Resume taking Colchicine 0.6 mg daily.  4. Prescription for Allopurinol 100 mg daily.  5. Patient scheduled for surgery on 03/23/2019. Surgery will consist of Tailor's bunionectomy bilateral; total  permanent nail avulsions bilateral great toes.  6. Return to clinic one week post op.    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       Edrick Kins, DPM [facility]  Dr. Edrick Kins, [address]                                        [place], [place] [address]                Office [phone]  Fax [phone]

## 2019-03-02 NOTE — Telephone Encounter (Signed)
Sent to the pharmacy by e-scribe as instructed. 

## 2019-03-02 NOTE — Telephone Encounter (Signed)
I would like my postop appointment scheduled in [place]. Thanks.

## 2019-03-03 ENCOUNTER — Encounter: Payer: Self-pay | Admitting: Adult Health

## 2019-03-03 ENCOUNTER — Other Ambulatory Visit: Payer: Self-pay

## 2019-03-03 ENCOUNTER — Ambulatory Visit: Payer: BC Managed Care – PPO | Admitting: Adult Health

## 2019-03-03 VITALS — BP 134/88 | Temp 98.0°F | Wt 300.0 lb

## 2019-03-03 DIAGNOSIS — M7989 Other specified soft tissue disorders: Secondary | ICD-10-CM

## 2019-03-03 NOTE — Progress Notes (Signed)
Subjective:    Patient ID: Matthew Savage, male    DOB: 1961-02-21, 58 y.o.   MRN: [id number]  HPI  58 year old male who  has a past medical history of Blood in urine, DDD (degenerative disc disease), DVT (deep venous thrombosis) ([place]) (2017 & 2018), Gout, Heart murmur, Hyperlipidemia, Hypertension, Numbness and tingling, and SCC (squamous cell carcinoma).  58 year old male who presents to the office today for concern of left lower extremity swelling.  He feels as though he has had intermittent episodes of swelling to his lower extremity on the left side.  This does not happen every day.  He denies calf tenderness, redness, or warmth.  Has not had any shortness of breath or chest pain.  Does have a history of DVT but takes Xarelto on a daily basis.  He has had issues with gout in his feet and is scheduled for surgery in early December, he has been receiving steroid injections into the toes that the gout has affected.  Wt Readings from Last 3 Encounters:  03/03/19 300 lb (136.1 kg)  11/18/18 293 lb 11.2 oz (133.2 kg)  03/23/18 283 lb (128.4 kg)    Review of Systems See HPI   Past Medical History:  Diagnosis Date  . Blood in urine   . DDD (degenerative disc disease)   . DVT (deep venous thrombosis) ([place]) 2017 & 2018  . Gout   . Heart murmur   . Hyperlipidemia   . Hypertension   . Numbness and tingling    right arm and hand  . SCC (squamous cell carcinoma)    skin, squamous cell, face    Social History   Socioeconomic History  . Marital status: Single    Spouse name: Not on file  . Number of children: Not on file  . Years of education: Not on file  . Highest education level: Not on file  Occupational History  . Not on file  Social Needs  . Financial resource strain: Not on file  . Food insecurity    Worry: Not on file    Inability: Not on file  . Transportation needs    Medical: Not on file    Non-medical: Not on file  Tobacco Use  . Smoking status: Current Every  Day Smoker    Packs/day: 2.00    Years: 36.00    Pack years: 72.00    Types: Cigarettes  . Smokeless tobacco: Never Used  Substance and Sexual Activity  . Alcohol use: Yes    Alcohol/week: 12.0 standard drinks    Types: 12 Standard drinks or equivalent per week    Comment: 2 per day  . Drug use: No  . Sexual activity: Not on file  Lifestyle  . Physical activity    Days per week: Not on file    Minutes per session: Not on file  . Stress: Not on file  Relationships  . Social Herbalist on phone: Not on file    Gets together: Not on file    Attends religious service: Not on file    Active member of club or organization: Not on file    Attends meetings of clubs or organizations: Not on file    Relationship status: Not on file  . Intimate partner violence    Fear of current or ex partner: Not on file    Emotionally abused: Not on file    Physically abused: Not on file    Forced sexual  activity: Not on file  Other Topics Concern  . 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    Married    No kids    Past Surgical History:  Procedure Laterality Date  . HERNIA REPAIR  1967   X 2  . LAPAROSCOPIC GASTRIC BANDING  03/2009   in [place], [place]  . MASS EXCISION  12/03/2011   X 2 (gluteal) X 1 (lower back)  . MOHS SURGERY     x 2    Family History  Problem Relation Age of Onset  . Breast cancer Mother   . Other Mother        small cell carcinoma  . Hypertension Father   . Colon polyps Father   . Colon polyps Brother     No Known Allergies  Current Outpatient Medications on File Prior to Visit  Medication Sig Dispense Refill  . allopurinol (ZYLOPRIM) 100 MG tablet Take 1 tablet (100 mg total) by mouth daily. 30 tablet 6  . amLODipine (NORVASC) 10 MG tablet Take 1 tablet (10 mg total) by mouth daily. 90 tablet 3  . COLCRYS 0.6 MG tablet TAKE 2 TABLETS NOW, THEN 1 TABLET BY MOUTH DAILY 26 tablet 1  . ibuprofen (ADVIL,MOTRIN)  200 MG tablet Take 800 mg by mouth every 6 (six) hours as needed.      Marland Kitchen lisinopril (ZESTRIL) 40 MG tablet TAKE 1 TABLET BY MOUTH EVERY DAY 90 tablet 0  . loratadine (CLARITIN) 10 MG tablet Take 10 mg by mouth daily.    . Multiple Vitamin (MULTIVITAMIN) tablet Take 1 tablet by mouth daily.      Alveda Reasons 20 MG TABS tablet TAKE 1 TABLET BY MOUTH EVERY DAY WITH SUPPER 90 tablet 0   Current Facility-Administered Medications on File Prior to Visit  Medication Dose Route Frequency Provider Last Rate Last Dose  . 0.9 %  sodium chloride infusion  500 mL Intravenous Once Pyrtle, Lajuan Lines, MD        BP 134/88   Temp 98 F (36.7 C) (Temporal)   Wt 300 lb (136.1 kg)   BMI 40.69 kg/m       Objective:   Physical Exam Vitals signs and nursing note reviewed.  Constitutional:      Appearance: Normal appearance.  Cardiovascular:     Rate and Rhythm: Normal rate and regular rhythm.     Pulses: Normal pulses.     Heart sounds: Normal heart sounds.  Pulmonary:     Effort: Pulmonary effort is normal.     Breath sounds: Normal breath sounds.  Musculoskeletal: Normal range of motion.     Right lower leg: Edema present.     Left lower leg: Edema present.     Comments: Trace pitting edema noted bilateral lower extremities.  Both of his legs are large but this appears to be from adipose tissue.  Both measure 51 cm in diameter at the calf  Skin:    General: Skin is warm and dry.     Capillary Refill: Capillary refill takes less than 2 seconds.     Comments: No calf pain, redness, or warmth noted  Neurological:     General: No focal deficit present.     Mental Status: He is alert and oriented to person, place, and time.  Psychiatric:        Mood and Affect: Mood normal.        Behavior: Behavior normal.  Thought Content: Thought content normal.        Judgment: Judgment normal.       Assessment & Plan:  1. Swelling of lower leg -As mentioned above he has very mild bilateral pitting edema  in his lower extremities.  His weight has gone up almost 20 pounds over the last year.  No concern for DVT.  Encouraged weight loss through diet as exercise is hard with him due to bilateral foot pain  - Follow up as needed  Dorothyann Peng, NP

## 2019-03-13 ENCOUNTER — Encounter: Payer: Self-pay | Admitting: Adult Health

## 2019-03-14 ENCOUNTER — Telehealth: Payer: Self-pay | Admitting: *Deleted

## 2019-03-14 NOTE — Telephone Encounter (Signed)
"  I have a question.  For my upcoming surgery, I was exposed to Covid.  I just want to make sure you don't have any requirements I need to take.  I don't have any symptoms yet.  I just want to check."  I called Caren Griffins at the surgical center and she said she would get Anne Ng to give him a call.   I am returning your call.  How can I help you?  "I was contacted by my employer that I was exposed to someone that had Covid.  That was on November 19.  By the time I have my surgery, it will be 14 days.  I don't have any symptoms.  What should I do?  Should I do a Covid test?"  I suggest you have a Covid test done five days prior to your surgery date to be sure you do not have it.  "Well that's not what the person at the surgical center told me when I told her what was going on."  Well, you are going to get a call from [place] from the surgical center.  She will let you know what you need to do.  "I've already spoke to Eunice.  She's the one I was referring to."  Well, if you have spoken to [place], do what she has instructed you to do.  She handles all infectious diseases at the surgery center.  "I didn't know if there was a different protocol for the doctor that needed to be done."  Do what ever Pamala Hurry suggested.

## 2019-03-21 ENCOUNTER — Encounter: Payer: Self-pay | Admitting: Podiatry

## 2019-03-21 ENCOUNTER — Other Ambulatory Visit: Payer: Self-pay | Admitting: Adult Health

## 2019-03-21 NOTE — Telephone Encounter (Signed)
Pt called to follow up on his earlier question. He is schedule for surgery this Thursday and would like to know if he needs to discontinue his Xarelto until after his surgery. Please give patient a call.

## 2019-03-23 ENCOUNTER — Encounter: Payer: Self-pay | Admitting: Podiatry

## 2019-03-23 ENCOUNTER — Other Ambulatory Visit: Payer: Self-pay | Admitting: Podiatry

## 2019-03-23 DIAGNOSIS — L600 Ingrowing nail: Secondary | ICD-10-CM | POA: Diagnosis not present

## 2019-03-23 DIAGNOSIS — M21541 Acquired clubfoot, right foot: Secondary | ICD-10-CM | POA: Diagnosis not present

## 2019-03-23 DIAGNOSIS — M21542 Acquired clubfoot, left foot: Secondary | ICD-10-CM | POA: Diagnosis not present

## 2019-03-23 MED ORDER — OXYCODONE-ACETAMINOPHEN 5-325 MG PO TABS: 1.0000 | ORAL_TABLET | Freq: Four times a day (QID) | ORAL | 0 refills | Status: DC | PRN

## 2019-03-23 NOTE — Progress Notes (Signed)
PRN postop 

## 2019-03-24 ENCOUNTER — Telehealth: Payer: Self-pay | Admitting: *Deleted

## 2019-03-24 NOTE — Telephone Encounter (Signed)
Pt left name and date of birth.

## 2019-03-24 NOTE — Telephone Encounter (Signed)
Pt states he had surgery yesterday and doesn't remember speaking to Dr. Amalia Hailey, but has his papers, and the dressing feels like it is squeezing the foot. I told pt that Dr. Amalia Hailey spoke with him yesterday after the surgery and if there was a complication Dr. Amalia Hailey would have called him again. I told pt the ace wrap covering the surgical dressing is applied snuggly to staunch any postop bleeding and hold out surgical swelling but some times after it has done its job it will be too snug. I told pt to seat down, remove the cam boot, open-ended sock and ace, then elevate the foot for 15 minutes, but if the pain increased then dangle the foot for 15 minutes, after 15 minutes place foot level with the hip and beginning at the toes loosely roll the ace down the foot and up the leg to hold the dressing in place and replace the sock and boot. Pt states understanding I told pt to take the pain medication as directed.

## 2019-03-25 ENCOUNTER — Telehealth: Payer: Self-pay

## 2019-03-27 ENCOUNTER — Telehealth: Payer: Self-pay | Admitting: *Deleted

## 2019-03-27 NOTE — Telephone Encounter (Signed)
I called pt and asked if he was having any trouble and he stated he was having some problems with his legs keeping them up made him feel like he didn't have any blood in them, but he states he fixed that. Pt states Nira Conn called him.

## 2019-03-27 NOTE — Telephone Encounter (Signed)
Pt left his name, DOB and that he had questions.

## 2019-03-28 ENCOUNTER — Other Ambulatory Visit: Payer: BC Managed Care – PPO

## 2019-03-29 ENCOUNTER — Ambulatory Visit (INDEPENDENT_AMBULATORY_CARE_PROVIDER_SITE_OTHER): Payer: BC Managed Care – PPO | Admitting: Podiatry

## 2019-03-29 ENCOUNTER — Telehealth: Payer: Self-pay | Admitting: *Deleted

## 2019-03-29 ENCOUNTER — Ambulatory Visit (INDEPENDENT_AMBULATORY_CARE_PROVIDER_SITE_OTHER): Payer: BC Managed Care – PPO

## 2019-03-29 ENCOUNTER — Encounter: Payer: Self-pay | Admitting: Podiatry

## 2019-03-29 ENCOUNTER — Other Ambulatory Visit: Payer: Self-pay

## 2019-03-29 DIAGNOSIS — M21621 Bunionette of right foot: Secondary | ICD-10-CM | POA: Diagnosis not present

## 2019-03-29 DIAGNOSIS — M21622 Bunionette of left foot: Secondary | ICD-10-CM | POA: Diagnosis not present

## 2019-03-29 DIAGNOSIS — Z9889 Other specified postprocedural states: Secondary | ICD-10-CM

## 2019-03-29 MED ORDER — SULFAMETHOXAZOLE-TRIMETHOPRIM 800-160 MG PO TABS: 1.0000 | ORAL_TABLET | Freq: Two times a day (BID) | ORAL | 0 refills | Status: DC

## 2019-03-29 NOTE — Telephone Encounter (Signed)
Pt called states he was seen in office today and Dr. Amalia Hailey has not called in his antibiotics.

## 2019-04-01 ENCOUNTER — Encounter: Payer: Self-pay | Admitting: Adult Health

## 2019-04-01 ENCOUNTER — Encounter: Payer: Self-pay | Admitting: Podiatry

## 2019-04-02 NOTE — Progress Notes (Signed)
   Subjective:  Patient presents today status post Tailor's bunionectomy bilateral. DOS: 03/23/2019. He states he is doing well. He reports minimal pain that is exacerbated by applying pressure to the feet. He has been using the post op shoes and ace wraps as directed. Patient is here for further evaluation and treatment.    Past Medical History:  Diagnosis Date  . Blood in urine   . DDD (degenerative disc disease)   . DVT (deep venous thrombosis) ([place]) 2017 & 2018  . Gout   . Heart murmur   . Hyperlipidemia   . Hypertension   . Numbness and tingling    right arm and hand  . SCC (squamous cell carcinoma)    skin, squamous cell, face      Objective/Physical Exam Neurovascular status intact.  Skin incisions appear to be well coapted with sutures and staples intact. No sign of infectious process noted. No dehiscence. No active bleeding noted. Moderate edema noted to the surgical extremity.  Radiographic Exam:  Orthopedic hardware and osteotomies sites appear to be stable with routine healing.  Assessment: 1. s/p Tailor's bunionectomy bilateral. DOS: 03/23/2019   Plan of Care:  1. Patient was evaluated. X-rays reviewed 2. Dressing changed.  3. Continue using post op shoes.  4. Continue using ace wraps.  5. Return to clinic in one week for staple removal.    Edrick Kins, DPM [facility]  Dr. Edrick Kins, [address]                                        [place], [place] [address]                Office [phone]  Fax [phone]

## 2019-04-03 NOTE — Telephone Encounter (Signed)
I spoke with pt and asked how he knew he had gout and not something else going on with his surgery feet. Pt states he has gout symptoms in his knee. I told pt he would need to contact his PCP. Pt states he sent his PCP the same message.

## 2019-04-04 ENCOUNTER — Other Ambulatory Visit: Payer: Self-pay | Admitting: Adult Health

## 2019-04-04 ENCOUNTER — Other Ambulatory Visit: Payer: BC Managed Care – PPO

## 2019-04-04 MED ORDER — ALLOPURINOL 300 MG PO TABS: 300.0000 mg | ORAL_TABLET | Freq: Every day | ORAL | 1 refills | Status: DC

## 2019-04-10 ENCOUNTER — Ambulatory Visit (INDEPENDENT_AMBULATORY_CARE_PROVIDER_SITE_OTHER): Payer: BC Managed Care – PPO | Admitting: Podiatry

## 2019-04-10 ENCOUNTER — Other Ambulatory Visit: Payer: Self-pay

## 2019-04-10 DIAGNOSIS — M21621 Bunionette of right foot: Secondary | ICD-10-CM

## 2019-04-10 DIAGNOSIS — M21622 Bunionette of left foot: Secondary | ICD-10-CM

## 2019-04-10 DIAGNOSIS — Z9889 Other specified postprocedural states: Secondary | ICD-10-CM

## 2019-04-11 NOTE — Telephone Encounter (Signed)
I called the patient back and spoke to him at length about the pain/cramping in his lower legs. He has had them elevated for long periods of time. Advised ok to let his feet down periodically and to make sure he is drinking plenty of water. Patient will call back if issued does not resolve.

## 2019-04-16 NOTE — Progress Notes (Signed)
   Subjective:  Patient presents today status post Tailor's bunionectomy bilateral. DOS: 03/23/2019. He states he is doing well. He denies any significant pain or modifying factors. He has been using the post op shoes on both feet as directed. Patient is here for further evaluation and treatment.    Past Medical History:  Diagnosis Date  . Blood in urine   . DDD (degenerative disc disease)   . DVT (deep venous thrombosis) ([place]) 2017 & 2018  . Gout   . Heart murmur   . Hyperlipidemia   . Hypertension   . Numbness and tingling    right arm and hand  . SCC (squamous cell carcinoma)    skin, squamous cell, face      Objective/Physical Exam Neurovascular status intact.  Skin incisions appear to be well coapted with sutures and staples intact. No sign of infectious process noted. No dehiscence. No active bleeding noted. Moderate edema noted to the surgical extremity.  Assessment: 1. s/p Tailor's bunionectomy bilateral. DOS: 03/23/2019   Plan of Care:  1. Patient was evaluated.  2. Staples removed.  3. Continue weightbearing in post op shoes bilaterally.  4. Return to clinic in 2 weeks for final post op visit and return to work.    Edrick Kins, DPM [facility]  Dr. Edrick Kins, [address]                                        [place], [place] [address]                Office [phone]  Fax [phone]

## 2019-04-18 ENCOUNTER — Other Ambulatory Visit: Payer: BC Managed Care – PPO

## 2019-04-24 ENCOUNTER — Other Ambulatory Visit: Payer: Self-pay

## 2019-04-24 ENCOUNTER — Ambulatory Visit (INDEPENDENT_AMBULATORY_CARE_PROVIDER_SITE_OTHER): Payer: BC Managed Care – PPO | Admitting: Podiatry

## 2019-04-24 DIAGNOSIS — M21621 Bunionette of right foot: Secondary | ICD-10-CM

## 2019-04-24 DIAGNOSIS — M21622 Bunionette of left foot: Secondary | ICD-10-CM

## 2019-04-24 DIAGNOSIS — Z9889 Other specified postprocedural states: Secondary | ICD-10-CM

## 2019-04-26 ENCOUNTER — Telehealth: Payer: Self-pay | Admitting: *Deleted

## 2019-04-26 MED ORDER — SULFAMETHOXAZOLE-TRIMETHOPRIM 800-160 MG PO TABS: 1.0000 | ORAL_TABLET | Freq: Two times a day (BID) | ORAL | 0 refills | Status: DC

## 2019-04-26 NOTE — Telephone Encounter (Signed)
Pt states he has an infection on the great toenail procedure site.

## 2019-04-26 NOTE — Telephone Encounter (Signed)
Pt states the area is red behind where the ingrown was removed is red and he has pressed yellow fluid from it after applying heat to the toe and now the tip is very red. I told pt not to apply heat in that form, cleanse area with clean wash cloth soaked in epsom salt water or an antibacterial soap soaked cloth and rinse and apply antibiotic ointment. Pt states he was on an antibiotic previously when they thought another area was infected. I told pt I would inform Dr. Amalia Hailey and refill the antibiotic.

## 2019-04-27 NOTE — Progress Notes (Signed)
   Subjective:  Patient presents today status post Tailor's bunionectomy bilateral. DOS: 03/23/2019. He states he is doing well. He reports some mild pain when he is on his feet for long periods of time. He has been using the post op shoes as directed. Patient is here for further evaluation and treatment.    Past Medical History:  Diagnosis Date  . Blood in urine   . DDD (degenerative disc disease)   . DVT (deep venous thrombosis) ([place]) 2017 & 2018  . Gout   . Heart murmur   . Hyperlipidemia   . Hypertension   . Numbness and tingling    right arm and hand  . SCC (squamous cell carcinoma)    skin, squamous cell, face      Objective/Physical Exam Neurovascular status intact.  Skin incisions appear to be well coapted. No sign of infectious process noted. No dehiscence. No active bleeding noted. Moderate edema noted to the surgical extremity.  Assessment: 1. s/p Tailor's bunionectomy bilateral. DOS: 03/23/2019   Plan of Care:  1. Patient was evaluated.  2. Discontinue using post op shoes.  3. Recommended good sneakers.  4. Slowly increase activity.  5. Return to clinic in 4 weeks for final follow up visit.   Patient is set to return to work on 05/31/2019.    Edrick Kins, DPM [facility]  Dr. Edrick Kins, [employer]                                        [place], [place] [address]                Office [phone]  Fax [phone]

## 2019-05-22 ENCOUNTER — Ambulatory Visit (INDEPENDENT_AMBULATORY_CARE_PROVIDER_SITE_OTHER): Payer: BC Managed Care – PPO | Admitting: Podiatry

## 2019-05-22 ENCOUNTER — Ambulatory Visit (INDEPENDENT_AMBULATORY_CARE_PROVIDER_SITE_OTHER): Payer: BC Managed Care – PPO

## 2019-05-22 ENCOUNTER — Encounter: Payer: Self-pay | Admitting: Podiatry

## 2019-05-22 ENCOUNTER — Other Ambulatory Visit: Payer: Self-pay

## 2019-05-22 DIAGNOSIS — M21622 Bunionette of left foot: Secondary | ICD-10-CM

## 2019-05-22 DIAGNOSIS — M21621 Bunionette of right foot: Secondary | ICD-10-CM

## 2019-05-22 DIAGNOSIS — Z9889 Other specified postprocedural states: Secondary | ICD-10-CM

## 2019-05-25 NOTE — Progress Notes (Signed)
   Subjective:  Patient presents today status post Tailor's bunionectomy bilateral. DOS: 03/23/2019. He states he is doing well. He denies any pain or modifying factors. He has been wearing good shoes as instructed. Patient is here for further evaluation and treatment.    Past Medical History:  Diagnosis Date  . Blood in urine   . DDD (degenerative disc disease)   . DVT (deep venous thrombosis) ([place]) 2017 & 2018  . Gout   . Heart murmur   . Hyperlipidemia   . Hypertension   . Numbness and tingling    right arm and hand  . SCC (squamous cell carcinoma)    skin, squamous cell, face      Objective/Physical Exam Neurovascular status intact.  Skin incisions appear to be well coapted. No sign of infectious process noted. No dehiscence. No active bleeding noted. Moderate edema noted to the surgical extremity.  Radiographic Exam:  Orthopedic hardware and osteotomies sites appear to be stable with routine healing.  Assessment: 1. s/p Tailor's bunionectomy bilateral. DOS: 03/23/2019   Plan of Care:  1. Patient was evaluated. X-Rays reviewed.  2. May resume full activity with no restrictions.  3. Recommended good shoe gear.  4. Return to work on 06/01/2019 with no restrictions.  5. Return to clinic as needed.   Patient is set to return to work on 05/31/2019.    Edrick Kins, DPM [facility]  Dr. Edrick Kins, [address]                                        [place], [place] [address]                Office [phone]  Fax [phone]

## 2019-06-06 ENCOUNTER — Other Ambulatory Visit: Payer: Self-pay

## 2019-06-06 ENCOUNTER — Encounter: Payer: Self-pay | Admitting: Adult Health

## 2019-06-06 ENCOUNTER — Ambulatory Visit (INDEPENDENT_AMBULATORY_CARE_PROVIDER_SITE_OTHER): Payer: BC Managed Care – PPO | Admitting: Adult Health

## 2019-06-06 VITALS — BP 120/78 | HR 84 | Temp 98.0°F | Ht 70.75 in | Wt 306.0 lb

## 2019-06-06 DIAGNOSIS — M109 Gout, unspecified: Secondary | ICD-10-CM

## 2019-06-06 DIAGNOSIS — I10 Essential (primary) hypertension: Secondary | ICD-10-CM | POA: Diagnosis not present

## 2019-06-06 DIAGNOSIS — E669 Obesity, unspecified: Secondary | ICD-10-CM

## 2019-06-06 DIAGNOSIS — Z125 Encounter for screening for malignant neoplasm of prostate: Secondary | ICD-10-CM

## 2019-06-06 DIAGNOSIS — E785 Hyperlipidemia, unspecified: Secondary | ICD-10-CM | POA: Diagnosis not present

## 2019-06-06 DIAGNOSIS — F17200 Nicotine dependence, unspecified, uncomplicated: Secondary | ICD-10-CM

## 2019-06-06 DIAGNOSIS — Z0000 Encounter for general adult medical examination without abnormal findings: Secondary | ICD-10-CM

## 2019-06-06 DIAGNOSIS — I825Z2 Chronic embolism and thrombosis of unspecified deep veins of left distal lower extremity: Secondary | ICD-10-CM

## 2019-06-06 MED ORDER — LISINOPRIL 40 MG PO TABS: 40.0000 mg | ORAL_TABLET | Freq: Every day | ORAL | 3 refills | Status: DC

## 2019-06-06 MED ORDER — RIVAROXABAN 20 MG PO TABS: ORAL_TABLET | ORAL | 3 refills | Status: DC

## 2019-06-06 MED ORDER — AMLODIPINE BESYLATE 10 MG PO TABS: 10.0000 mg | ORAL_TABLET | Freq: Every day | ORAL | 3 refills | Status: DC

## 2019-06-06 MED ORDER — ALLOPURINOL 300 MG PO TABS: 300.0000 mg | ORAL_TABLET | Freq: Every day | ORAL | 3 refills | Status: DC

## 2019-06-06 NOTE — Patient Instructions (Signed)
It was great seeing you today   Please follow up in one year  Schedule your lab work   Work on quitting smoking and weight loss

## 2019-06-06 NOTE — Progress Notes (Signed)
Subjective:    Patient ID: Matthew Savage, male    DOB: 15-Dec-1960, 59 y.o.   MRN: [id number]  HPI Patient presents for yearly preventative medicine examination. He is a pleasant 59 year old male who  has a past medical history of Blood in urine, DDD (degenerative disc disease), DVT (deep venous thrombosis) ([place]) (2017 & 2018), Gout, Heart murmur, Hyperlipidemia, Hypertension, Numbness and tingling, and SCC (squamous cell carcinoma).  Hypertension-currently prescribed Norvasc 10 mg and lisinopril 40 mg daily.  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  He denies bleeding, shortness of breath, or chest pain.  H/o of gout-takes allopurinol 300 mg daily and colchicine PRN for flare.   Tobacco Use - He has been cutting back on smoking; he is looking at acupuncture to help him quit    History of bariatric surgery -lap band done in [facility]  History of [employer] bunionectomy in 03/2019 - reports that he is doing well. He no longer has any pain.   All immunizations and health maintenance protocols were reviewed with the patient and needed orders were placed. UTD on vacciantions   Appropriate screening laboratory values were ordered for the patient including screening of hyperlipidemia, renal function and hepatic function. If indicated by BPH, a PSA was ordered.  Medication reconciliation,  past medical history, social history, problem list and allergies were reviewed in detail with the patient  Goals were established with regard to weight loss, exercise, and  diet in compliance with medications. He has started becoming more active since his foot surgery. He is back to work and ready to work on weight loss.  Wt Readings from Last 3 Encounters:  06/06/19 (!) 306 lb (138.8 kg)  03/03/19 300 lb (136.1  kg)  11/18/18 293 lb 11.2 oz (133.2 kg)    End of life planning was discussed.  He is up-to-date on routine screening colonoscopy as his last was in 2019-he is on the 5-year plan due to polyps.   Review of Systems  Constitutional: Negative.   HENT: Negative.   Eyes: Negative.   Respiratory: Negative.   Cardiovascular: Negative.   Gastrointestinal: Negative.   Endocrine: Negative.   Genitourinary: Negative.   Musculoskeletal: Negative.   Skin: Negative.   Allergic/Immunologic: Negative.   Neurological: Negative.   Hematological: Negative.   Psychiatric/Behavioral: Negative.   All other systems reviewed and are negative.  Past Medical History:  Diagnosis Date  . Blood in urine   . DDD (degenerative disc disease)   . DVT (deep venous thrombosis) ([place]) 2017 & 2018  . Gout   . Heart murmur   . Hyperlipidemia   . Hypertension   . Numbness and tingling    right arm and hand  . SCC (squamous cell carcinoma)    skin, squamous cell, face    Social History   Socioeconomic History  . Marital status: Single    Spouse name: Not on file  . Number of children: Not on file  . Years of education: Not on file  . Highest education level: Not on file  Occupational History  . Not on file  Tobacco Use  . Smoking status: Current Every Day Smoker    Packs/day: 2.00    Years: 36.00    Pack years: 72.00    Types: Cigarettes  . Smokeless  tobacco: Never Used  Substance and Sexual Activity  . Alcohol use: Yes    Alcohol/week: 12.0 standard drinks    Types: 12 Standard drinks or equivalent per week    Comment: 2 per day  . Drug use: No  . Sexual activity: Not on file  Other Topics Concern  . 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    Married    No kids   Social Determinants of Health   Financial Resource Strain:   . Difficulty of Paying Living Expenses: Not on file  Food Insecurity:   . Worried About Charity fundraiser in the  Last Year: Not on file  . Ran Out of Food in the Last Year: Not on file  Transportation Needs:   . Lack of Transportation (Medical): Not on file  . Lack of Transportation (Non-Medical): Not on file  Physical Activity:   . Days of Exercise per Week: Not on file  . Minutes of Exercise per Session: Not on file  Stress:   . Feeling of Stress : Not on file  Social Connections:   . Frequency of Communication with Friends and Family: Not on file  . Frequency of Social Gatherings with Friends and Family: Not on file  . Attends Religious Services: Not on file  . Active Member of Clubs or Organizations: Not on file  . Attends Archivist Meetings: Not on file  . Marital Status: Not on file  Intimate Partner Violence:   . Fear of Current or Ex-Partner: Not on file  . Emotionally Abused: Not on file  . Physically Abused: Not on file  . Sexually Abused: Not on file    Past Surgical History:  Procedure Laterality Date  . HERNIA REPAIR  1967   X 2  . LAPAROSCOPIC GASTRIC BANDING  03/2009   in [place], [place]  . MASS EXCISION  12/03/2011   X 2 (gluteal) X 1 (lower back)  . MOHS SURGERY     x 2    Family History  Problem Relation Age of Onset  . Breast cancer Mother   . Other Mother        small cell carcinoma  . Hypertension Father   . Colon polyps Father   . Colon polyps Brother     No Known Allergies  Current Outpatient Medications on File Prior to Visit  Medication Sig Dispense Refill  . COLCRYS 0.6 MG tablet TAKE 2 TABLETS NOW, THEN 1 TABLET BY MOUTH DAILY 26 tablet 1  . ibuprofen (ADVIL,MOTRIN) 200 MG tablet Take 800 mg by mouth every 6 (six) hours as needed.      . loratadine (CLARITIN) 10 MG tablet Take 10 mg by mouth daily.    . Multiple Vitamin (MULTIVITAMIN) tablet Take 1 tablet by mouth daily.      Marland Kitchen sulfamethoxazole-trimethoprim (BACTRIM DS) 800-160 MG tablet Take 1 tablet by mouth 2 (two) times daily. 20 tablet 0   No current facility-administered medications  on file prior to visit.    BP 120/78   Pulse 84   Temp 98 F (36.7 C) (Other (Comment))   Ht 5' 10.75" (1.797 m)   Wt (!) 306 lb (138.8 kg)   SpO2 98%   BMI 42.98 kg/m       Objective:   Physical Exam Constitutional:      General: He is not in acute distress.    Appearance: He is well-developed. He is  obese.  HENT:     Head: Normocephalic and atraumatic.     Right Ear: Tympanic membrane, ear canal and external ear normal. There is no impacted cerumen.     Left Ear: Tympanic membrane, ear canal and external ear normal. There is no impacted cerumen.     Nose: Nose normal. No congestion or rhinorrhea.     Mouth/Throat:     Mouth: Mucous membranes are moist.     Pharynx: Oropharynx is clear. No oropharyngeal exudate or posterior oropharyngeal erythema.  Eyes:     General:        Right eye: No discharge.        Left eye: No discharge.  Neck:     Trachea: No tracheal deviation.  Cardiovascular:     Rate and Rhythm: Normal rate.     Pulses: Normal pulses.     Heart sounds: Normal heart sounds. No murmur. No friction rub. No gallop.   Pulmonary:     Effort: Pulmonary effort is normal. No respiratory distress.     Breath sounds: Normal breath sounds. No stridor. No wheezing, rhonchi or rales.  Chest:     Chest wall: No tenderness.  Abdominal:     General: Bowel sounds are normal. There is no distension.     Palpations: Abdomen is soft. There is no mass.     Tenderness: There is no abdominal tenderness. There is no right CVA tenderness, left CVA tenderness, guarding or rebound.     Hernia: No hernia is present.  Musculoskeletal:        General: No swelling, tenderness, deformity or signs of injury. Normal range of motion.     Right lower leg: No edema.     Left lower leg: No edema.  Lymphadenopathy:     Cervical: No cervical adenopathy.  Skin:    General: Skin is warm and dry.     Capillary Refill: Capillary refill takes less than 2 seconds.     Coloration: Skin is not  jaundiced or pale.     Findings: No bruising, erythema, lesion or rash.  Neurological:     General: No focal deficit present.     Mental Status: He is alert and oriented to person, place, and time. Mental status is at baseline.     Cranial Nerves: No cranial nerve deficit.     Sensory: No sensory deficit.     Motor: No weakness.     Coordination: Coordination normal.     Gait: Gait normal.     Deep Tendon Reflexes: Reflexes normal.  Psychiatric:        Mood and Affect: Mood normal.        Behavior: Behavior normal.        Thought Content: Thought content normal.        Judgment: Judgment normal.        Assessment & Plan:  1. Routine general medical examination at a health care facility - Needs to quit smoking and work on weight loss  - Follow up in one year or sooner if needed - CBC with Differential/Platelet; Future - Comprehensive metabolic panel; Future - Hemoglobin A1c; Future - Lipid panel; Future - TSH; Future  2. Essential hypertension - BP well controlled.  - No change in medication  - CBC with Differential/Platelet; Future - Comprehensive metabolic panel; Future - Hemoglobin A1c; Future - Lipid panel; Future - TSH; Future - lisinopril (ZESTRIL) 40 MG tablet; Take 1 tablet (40 mg total) by mouth daily.  Dispense:  90 tablet; Refill: 3 - amLODipine (NORVASC) 10 MG tablet; Take 1 tablet (10 mg total) by mouth daily.  Dispense: 90 tablet; Refill: 3  3. Hyperlipidemia, unspecified hyperlipidemia type - Consider statin  - CBC with Differential/Platelet; Future - Comprehensive metabolic panel; Future - Hemoglobin A1c; Future - Lipid panel; Future - TSH; Future  4. Obesity, unspecified classification, unspecified obesity type, unspecified whether serious comorbidity present - encouraged weight loss through diet and exercise - CBC with Differential/Platelet; Future - Comprehensive metabolic panel; Future - Hemoglobin A1c; Future - Lipid panel; Future - TSH;  Future  5. TOBACCO USE - Encouraged to quit smoking   6. Acute gout involving toe of left foot, unspecified cause  - allopurinol (ZYLOPRIM) 300 MG tablet; Take 1 tablet (300 mg total) by mouth daily.  Dispense: 90 tablet; Refill: 3  7. Prostate cancer screening  - PSA; Future  8. 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

## 2019-06-20 ENCOUNTER — Other Ambulatory Visit: Payer: Self-pay

## 2019-06-21 ENCOUNTER — Other Ambulatory Visit (INDEPENDENT_AMBULATORY_CARE_PROVIDER_SITE_OTHER): Payer: BC Managed Care – PPO

## 2019-06-21 DIAGNOSIS — I10 Essential (primary) hypertension: Secondary | ICD-10-CM

## 2019-06-21 DIAGNOSIS — Z0000 Encounter for general adult medical examination without abnormal findings: Secondary | ICD-10-CM | POA: Diagnosis not present

## 2019-06-21 DIAGNOSIS — E785 Hyperlipidemia, unspecified: Secondary | ICD-10-CM | POA: Diagnosis not present

## 2019-06-21 DIAGNOSIS — Z125 Encounter for screening for malignant neoplasm of prostate: Secondary | ICD-10-CM

## 2019-06-21 DIAGNOSIS — E669 Obesity, unspecified: Secondary | ICD-10-CM

## 2019-06-21 LAB — LIPID PANEL
Cholesterol: 169 mg/dL (ref 0–200)
HDL: 44.7 mg/dL (ref >39.00)
LDL Cholesterol: 107 mg/dL — ABNORMAL HIGH (ref 0–99)
NonHDL: 124.21
Total CHOL/HDL Ratio: 4
Triglycerides: 85 mg/dL (ref 0.0–149.0)
VLDL: 17 mg/dL (ref 0.0–40.0)

## 2019-06-21 LAB — CBC WITH DIFFERENTIAL/PLATELET
Basophils Absolute: 0 10*3/uL (ref 0.0–0.1)
Basophils Relative: 0.7 % (ref 0.0–3.0)
Eosinophils Absolute: 0.1 10*3/uL (ref 0.0–0.7)
Eosinophils Relative: 1.9 % (ref 0.0–5.0)
HCT: 35.7 % — ABNORMAL LOW (ref 39.0–52.0)
Hemoglobin: 12.1 g/dL — ABNORMAL LOW (ref 13.0–17.0)
Lymphocytes Relative: 18.2 % (ref 12.0–46.0)
Lymphs Abs: 1.2 10*3/uL (ref 0.7–4.0)
MCHC: 34 g/dL (ref 30.0–36.0)
MCV: 106.7 fl — ABNORMAL HIGH (ref 78.0–100.0)
Monocytes Absolute: 0.6 10*3/uL (ref 0.1–1.0)
Monocytes Relative: 8.5 % (ref 3.0–12.0)
Neutro Abs: 4.8 10*3/uL (ref 1.4–7.7)
Neutrophils Relative %: 70.7 % (ref 43.0–77.0)
Platelets: 317 10*3/uL (ref 150.0–400.0)
RBC: 3.34 Mil/uL — ABNORMAL LOW (ref 4.22–5.81)
RDW: 17.3 % — ABNORMAL HIGH (ref 11.5–15.5)
WBC: 6.8 10*3/uL (ref 4.0–10.5)

## 2019-06-21 LAB — COMPREHENSIVE METABOLIC PANEL
ALT: 32 U/L (ref 0–53)
AST: 23 U/L (ref 0–37)
Albumin: 3.6 g/dL (ref 3.5–5.2)
Alkaline Phosphatase: 71 U/L (ref 39–117)
BUN: 17 mg/dL (ref 6–23)
CO2: 28 mEq/L (ref 19–32)
Calcium: 9.1 mg/dL (ref 8.4–10.5)
Chloride: 100 mEq/L (ref 96–112)
Creatinine, Ser: 1.35 mg/dL (ref 0.40–1.50)
GFR: 54.12 mL/min — ABNORMAL LOW (ref >60.00)
Glucose, Bld: 98 mg/dL (ref 70–99)
Potassium: 5 mEq/L (ref 3.5–5.1)
Sodium: 134 mEq/L — ABNORMAL LOW (ref 135–145)
Total Bilirubin: 0.5 mg/dL (ref 0.2–1.2)
Total Protein: 6.3 g/dL (ref 6.0–8.3)

## 2019-06-21 LAB — PSA: PSA: 0.5 ng/mL (ref 0.10–4.00)

## 2019-06-21 LAB — TSH: TSH: 1.58 u[IU]/mL (ref 0.35–4.50)

## 2019-06-21 LAB — HEMOGLOBIN A1C: Hgb A1c MFr Bld: 5.6 % (ref 4.6–6.5)

## 2019-11-10 ENCOUNTER — Encounter: Payer: Self-pay | Admitting: Adult Health

## 2019-12-15 ENCOUNTER — Telehealth: Payer: Self-pay | Admitting: Adult Health

## 2019-12-15 ENCOUNTER — Encounter: Payer: Self-pay | Admitting: Adult Health

## 2019-12-15 NOTE — Telephone Encounter (Signed)
Please advise 

## 2019-12-15 NOTE — Telephone Encounter (Signed)
Pt wants a referral to a cardiology, CT scan pt has a renal mass.  Wants to get these test set up soon  Please advise

## 2019-12-19 ENCOUNTER — Other Ambulatory Visit: Payer: Self-pay | Admitting: Adult Health

## 2019-12-19 DIAGNOSIS — I4891 Unspecified atrial fibrillation: Secondary | ICD-10-CM

## 2019-12-20 NOTE — Telephone Encounter (Signed)
Patient has a visit set up for Friday.

## 2019-12-21 NOTE — Telephone Encounter (Signed)
Noted  

## 2019-12-22 ENCOUNTER — Encounter: Payer: Self-pay | Admitting: Adult Health

## 2019-12-22 ENCOUNTER — Ambulatory Visit: Payer: BC Managed Care – PPO | Admitting: Adult Health

## 2019-12-22 ENCOUNTER — Other Ambulatory Visit: Payer: Self-pay

## 2019-12-22 VITALS — BP 130/80 | HR 120 | Ht 70.75 in | Wt 296.1 lb

## 2019-12-22 DIAGNOSIS — A419 Sepsis, unspecified organism: Secondary | ICD-10-CM | POA: Diagnosis not present

## 2019-12-22 DIAGNOSIS — E785 Hyperlipidemia, unspecified: Secondary | ICD-10-CM | POA: Diagnosis not present

## 2019-12-22 DIAGNOSIS — I4891 Unspecified atrial fibrillation: Secondary | ICD-10-CM

## 2019-12-22 DIAGNOSIS — N390 Urinary tract infection, site not specified: Secondary | ICD-10-CM

## 2019-12-22 DIAGNOSIS — I10 Essential (primary) hypertension: Secondary | ICD-10-CM

## 2019-12-22 DIAGNOSIS — S0502XA Injury of conjunctiva and corneal abrasion without foreign body, left eye, initial encounter: Secondary | ICD-10-CM

## 2019-12-22 DIAGNOSIS — N281 Cyst of kidney, acquired: Secondary | ICD-10-CM

## 2019-12-22 DIAGNOSIS — G4733 Obstructive sleep apnea (adult) (pediatric): Secondary | ICD-10-CM

## 2019-12-22 DIAGNOSIS — F17200 Nicotine dependence, unspecified, uncomplicated: Secondary | ICD-10-CM

## 2019-12-22 MED ORDER — METOPROLOL SUCCINATE ER 50 MG PO TB24: 50.0000 mg | ORAL_TABLET | Freq: Every day | ORAL | 0 refills | Status: DC

## 2019-12-22 MED ORDER — ERYTHROMYCIN 5 MG/GM OP OINT: 1.0000 "application " | TOPICAL_OINTMENT | Freq: Three times a day (TID) | OPHTHALMIC | 0 refills | Status: DC

## 2019-12-22 NOTE — Progress Notes (Addendum)
Subjective:    Patient ID: Matthew Savage, male    DOB: 1960-05-21, 59 y.o.   MRN: [id number]  HPI  59 year old male who  has a past medical history of Blood in urine, DDD (degenerative disc disease), DVT (deep venous thrombosis) ([place]) (2017 & 2018), Gout, Heart murmur, Hyperlipidemia, Hypertension, Numbness and tingling, and SCC (squamous cell carcinoma).  He presents to the office today for hospital follow up. He was seen at an OSH in [place], [place] where he was working.   He presented to the emergency room with complaints of tachycardia, chills, and diarrhea.  He was originally seen at urgent care where he was evaluated and had an EKG that showed A. fib and was sent to the ER for further evaluation.  He denied any fevers or chills at this time.  His work-up in the ER revealed mild acute kidney injury, elevated troponin, leukocytosis of unknown cause and he was admitted for further evaluation.  Patient was already on Xarelto for DVT.  In the ER his EKG showed sinus tachycardia and on admission EKG showed Atrial Flutter.   CT Chest W/WO IMPRESSION:  1. No evidence of a pulmonary embolus or aortic dissection.  2. Severe diffuse coronary arterial calcifications.  3. Evidence of old granulomatous disease.  4. Previous gastric band procedure.   CT Abdomen/Pelvis  1. Status post laparoscopic banding of the stomach. The band angle is within normal limits.  2. Bilateral renal cysts. There are a couple of exophytic lesions of the lower poles of the kidneys which measure greater than simple fluid density. Differential considerations include hemorrhagic cysts and renal neoplasm. CT scan without and with IV  contrast is recommended to further evaluate.  3. Mild standing of the right prevesical fat, of indeterminate etiology. Correlation with urinalysis is recommended to exclude cystitis.  4. Colonic diverticulosis without CT evidence of acute diverticulitis.  He was discharged home with  5-day course of Augmentin and was placed on metoprolol 50 mg extended release for rate control. Lisinopril and Norvasc were stopped  He reports that he is feeling much better and feels less fatigued.  He denies chest pain, shortness of breath, or palpitations.  He does have a history of sleep apnea and has CPAP machine from at least 2009.  Would like to get a new CPAP machine.  Biggest complaint today is that of left eye discomfort.  He reports that it feels as though "an eyelash or sand is in it.  Has had constant watery drainage from the eye over the last 2 days.  He denies trauma or aggravating injury.  No decreased vision or photophobia     Review of Systems  Constitutional: Negative.   HENT: Negative.   Eyes: Positive for pain and discharge. Negative for photophobia, redness, itching and visual disturbance.  Respiratory: Negative.   Cardiovascular: Negative.   Gastrointestinal: Negative.   Endocrine: Negative.   Genitourinary: Negative.   Musculoskeletal: Negative.   Skin: Negative.   Allergic/Immunologic: Negative.   Neurological: Negative.   Hematological: Negative.   Psychiatric/Behavioral: Negative.   All other systems reviewed and are negative.  Past Medical History:  Diagnosis Date  . Blood in urine   . DDD (degenerative disc disease)   . DVT (deep venous thrombosis) ([place]) 2017 & 2018  . Gout   . Heart murmur   . Hyperlipidemia   . Hypertension   . Numbness and tingling    right arm and hand  . SCC (squamous  cell carcinoma)    skin, squamous cell, face    Social History   Socioeconomic History  . Marital status: Single    Spouse name: Not on file  . Number of children: Not on file  . Years of education: Not on file  . Highest education level: Not on file  Occupational History  . Not on file  Tobacco Use  . Smoking status: Current Every Day Smoker    Packs/day: 2.00    Years: 36.00    Pack years: 72.00    Types: Cigarettes  . Smokeless tobacco:  Never Used  Substance and Sexual Activity  . Alcohol use: Yes    Alcohol/week: 12.0 standard drinks    Types: 12 Standard drinks or equivalent per week    Comment: 2 per day  . Drug use: No  . Sexual activity: Not on file  Other Topics Concern  . 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    Married    No kids   Social Determinants of Health   Financial Resource Strain:   . Difficulty of Paying Living Expenses: Not on file  Food Insecurity:   . Worried About Charity fundraiser in the Last Year: Not on file  . Ran Out of Food in the Last Year: Not on file  Transportation Needs:   . Lack of Transportation (Medical): Not on file  . Lack of Transportation (Non-Medical): Not on file  Physical Activity:   . Days of Exercise per Week: Not on file  . Minutes of Exercise per Session: Not on file  Stress:   . Feeling of Stress : Not on file  Social Connections:   . Frequency of Communication with Friends and Family: Not on file  . Frequency of Social Gatherings with Friends and Family: Not on file  . Attends Religious Services: Not on file  . Active Member of Clubs or Organizations: Not on file  . Attends Archivist Meetings: Not on file  . Marital Status: Not on file  Intimate Partner Violence:   . Fear of Current or Ex-Partner: Not on file  . Emotionally Abused: Not on file  . Physically Abused: Not on file  . Sexually Abused: Not on file    Past Surgical History:  Procedure Laterality Date  . HERNIA REPAIR  1967   X 2  . LAPAROSCOPIC GASTRIC BANDING  03/2009   in [place], [place]  . MASS EXCISION  12/03/2011   X 2 (gluteal) X 1 (lower back)  . MOHS SURGERY     x 2    Family History  Problem Relation Age of Onset  . Breast cancer Mother   . Other Mother        small cell carcinoma  . Hypertension Father   . Colon polyps Father   . Colon polyps Brother     No Known Allergies  Current Outpatient Medications on File  Prior to Visit  Medication Sig Dispense Refill  . allopurinol (ZYLOPRIM) 300 MG tablet Take 1 tablet (300 mg total) by mouth daily. 90 tablet 3  . ASPIRIN LOW DOSE 81 MG EC tablet Take 81 mg by mouth daily.    Marland Kitchen atorvastatin (LIPITOR) 40 MG tablet Take 40 mg by mouth at bedtime.    Marland Kitchen COLCRYS 0.6 MG tablet TAKE 2 TABLETS NOW, THEN 1 TABLET BY MOUTH DAILY 26 tablet 1  . ibuprofen (ADVIL,MOTRIN) 200 MG tablet Take 800  mg by mouth every 6 (six) hours as needed.      . loratadine (CLARITIN) 10 MG tablet Take 10 mg by mouth daily.    . Multiple Vitamin (MULTIVITAMIN) tablet Take 1 tablet by mouth daily.      . rivaroxaban (XARELTO) 20 MG TABS tablet TAKE 1 TABLET BY MOUTH EVERY DAY WITH SUPPER 90 tablet 3  . sulfamethoxazole-trimethoprim (BACTRIM DS) 800-160 MG tablet Take 1 tablet by mouth 2 (two) times daily. 20 tablet 0   No current facility-administered medications on file prior to visit.    BP 130/80   Pulse (!) 120   Ht 5' 10.75" (1.797 m)   Wt 296 lb 2 oz (134.3 kg)   SpO2 98%   BMI 41.59 kg/m       Objective:   Physical Exam Vitals and nursing note reviewed.  Constitutional:      General: He is not in acute distress.    Appearance: Normal appearance. He is well-developed. He is obese.  HENT:     Head: Normocephalic and atraumatic.     Right Ear: Tympanic membrane, ear canal and external ear normal. There is no impacted cerumen.     Left Ear: Tympanic membrane, ear canal and external ear normal. There is no impacted cerumen.     Nose: Nose normal. No congestion or rhinorrhea.     Mouth/Throat:     Mouth: Mucous membranes are moist.     Pharynx: Oropharynx is clear. No oropharyngeal exudate or posterior oropharyngeal erythema.  Eyes:     General: Lids are normal.        Right eye: No discharge.        Left eye: Discharge (watery ) present.    Extraocular Movements: Extraocular movements intact.     Conjunctiva/sclera: Conjunctivae normal.     Pupils: Pupils are equal,  round, and reactive to light.      Comments:  fluorescein uptake showed corneal abrasion   Neck:     Vascular: No carotid bruit.     Trachea: No tracheal deviation.  Cardiovascular:     Rate and Rhythm: Normal rate and regular rhythm.     Pulses: Normal pulses.     Heart sounds: Normal heart sounds. No murmur heard.  No friction rub. No gallop.   Pulmonary:     Effort: Pulmonary effort is normal. No respiratory distress.     Breath sounds: Normal breath sounds. No stridor. No wheezing, rhonchi or rales.  Chest:     Chest wall: No tenderness.  Abdominal:     General: Bowel sounds are normal. There is no distension.     Palpations: Abdomen is soft. There is no mass.     Tenderness: There is no abdominal tenderness. There is no right CVA tenderness, left CVA tenderness, guarding or rebound.     Hernia: No hernia is present.  Musculoskeletal:        General: No swelling, tenderness, deformity or signs of injury. Normal range of motion.     Right lower leg: No edema.     Left lower leg: No edema.  Lymphadenopathy:     Cervical: No cervical adenopathy.  Skin:    General: Skin is warm and dry.     Capillary Refill: Capillary refill takes less than 2 seconds.     Coloration: Skin is not jaundiced or pale.     Findings: No bruising, erythema, lesion or rash.  Neurological:     General: No focal deficit present.  Mental Status: He is alert and oriented to person, place, and time.     Cranial Nerves: No cranial nerve deficit.     Sensory: No sensory deficit.     Motor: No weakness.     Coordination: Coordination normal.     Gait: Gait normal.     Deep Tendon Reflexes: Reflexes normal.  Psychiatric:        Mood and Affect: Mood normal.        Behavior: Behavior normal.        Thought Content: Thought content normal.        Judgment: Judgment normal.       Assessment & Plan:  1. Atrial fibrillation, unspecified type ([place]) - Has an appointment with A fib clinic next week  -  CMP with eGFR([facility]); Future - CBC with Differential/Platelet; Future - EKG 12-Lead- Rate 96 - Undetermined Rhythm. ? A flutter?  - metoprolol succinate (TOPROL-XL) 50 MG 24 hr tablet; Take 1 tablet (50 mg total) by mouth daily.  Dispense: 90 tablet; Refill: 0 - CBC with Differential/Platelet - CMP with eGFR([facility])  2. Sepsis secondary to UTI ([place])  - Urinalysis; Future - Urinalysis  3. Essential hypertension  - CMP with eGFR([facility]); Future - CBC with Differential/Platelet; Future - metoprolol succinate (TOPROL-XL) 50 MG 24 hr tablet; Take 1 tablet (50 mg total) by mouth daily.  Dispense: 90 tablet; Refill: 0 - CBC with Differential/Platelet - CMP with eGFR([facility])  4. Hyperlipidemia, unspecified hyperlipidemia type - Continue with statin   5. TOBACCO USE - encouraged to quit smoking   6. OSA (obstructive sleep apnea)  - Ambulatory referral to Pulmonology  7. Bilateral renal cysts  - CT RENAL ABD W/WO; Future  8. Abrasion of left cornea, initial encounter  - erythromycin ophthalmic ointment; Place 1 application into the left eye 3 (three) times daily. X 5 days  Dispense: 3.5 g; Refill: 0  Dorothyann Peng, NP

## 2019-12-23 LAB — CBC WITH DIFFERENTIAL/PLATELET
Absolute Monocytes: 567 cells/uL (ref 200–950)
Basophils Absolute: 57 cells/uL (ref 0–200)
Basophils Relative: 0.7 %
Eosinophils Absolute: 138 cells/uL (ref 15–500)
Eosinophils Relative: 1.7 %
HCT: 34.7 % — ABNORMAL LOW (ref 38.5–50.0)
Hemoglobin: 11.6 g/dL — ABNORMAL LOW (ref 13.2–17.1)
Lymphs Abs: 1377 cells/uL (ref 850–3900)
MCH: 33.8 pg — ABNORMAL HIGH (ref 27.0–33.0)
MCHC: 33.4 g/dL (ref 32.0–36.0)
MCV: 101.2 fL — ABNORMAL HIGH (ref 80.0–100.0)
MPV: 8.9 fL (ref 7.5–12.5)
Monocytes Relative: 7 %
Neutro Abs: 5962 cells/uL (ref 1500–7800)
Neutrophils Relative %: 73.6 %
Platelets: 357 10*3/uL (ref 140–400)
RBC: 3.43 10*6/uL — ABNORMAL LOW (ref 4.20–5.80)
RDW: 14.4 % (ref 11.0–15.0)
Total Lymphocyte: 17 %
WBC: 8.1 10*3/uL (ref 3.8–10.8)

## 2019-12-23 LAB — COMPLETE METABOLIC PANEL WITH GFR
AG Ratio: 1.1 (calc) (ref 1.0–2.5)
ALT: 35 U/L (ref 9–46)
AST: 23 U/L (ref 10–35)
Albumin: 3.3 g/dL — ABNORMAL LOW (ref 3.6–5.1)
Alkaline phosphatase (APISO): 72 U/L (ref 35–144)
BUN: 19 mg/dL (ref 7–25)
CO2: 24 mmol/L (ref 20–32)
Calcium: 9 mg/dL (ref 8.6–10.3)
Chloride: 101 mmol/L (ref 98–110)
Creat: 1.08 mg/dL (ref 0.70–1.33)
GFR, Est African American: 87 mL/min/{1.73_m2} (ref >=60)
GFR, Est Non African American: 75 mL/min/{1.73_m2} (ref >=60)
Globulin: 2.9 g/dL (calc) (ref 1.9–3.7)
Glucose, Bld: 93 mg/dL (ref 65–99)
Potassium: 4.7 mmol/L (ref 3.5–5.3)
Sodium: 135 mmol/L (ref 135–146)
Total Bilirubin: 0.4 mg/dL (ref 0.2–1.2)
Total Protein: 6.2 g/dL (ref 6.1–8.1)

## 2019-12-23 LAB — URINALYSIS
Bilirubin Urine: NEGATIVE
Glucose, UA: NEGATIVE
Ketones, ur: NEGATIVE
Leukocytes,Ua: NEGATIVE
Nitrite: NEGATIVE
Specific Gravity, Urine: 1.017 (ref 1.001–1.03)
pH: 6 (ref 5.0–8.0)

## 2019-12-28 ENCOUNTER — Ambulatory Visit (INDEPENDENT_AMBULATORY_CARE_PROVIDER_SITE_OTHER): Payer: BC Managed Care – PPO | Admitting: Internal Medicine

## 2019-12-28 ENCOUNTER — Other Ambulatory Visit: Payer: Self-pay

## 2019-12-28 ENCOUNTER — Encounter: Payer: Self-pay | Admitting: Internal Medicine

## 2019-12-28 VITALS — BP 110/90 | HR 94 | Ht 72.0 in | Wt 290.4 lb

## 2019-12-28 DIAGNOSIS — I825Z2 Chronic embolism and thrombosis of unspecified deep veins of left distal lower extremity: Secondary | ICD-10-CM

## 2019-12-28 DIAGNOSIS — E785 Hyperlipidemia, unspecified: Secondary | ICD-10-CM | POA: Diagnosis not present

## 2019-12-28 DIAGNOSIS — I10 Essential (primary) hypertension: Secondary | ICD-10-CM | POA: Diagnosis not present

## 2019-12-28 DIAGNOSIS — I483 Typical atrial flutter: Secondary | ICD-10-CM | POA: Diagnosis not present

## 2019-12-28 DIAGNOSIS — F17200 Nicotine dependence, unspecified, uncomplicated: Secondary | ICD-10-CM

## 2019-12-28 DIAGNOSIS — I350 Nonrheumatic aortic (valve) stenosis: Secondary | ICD-10-CM | POA: Insufficient documentation

## 2019-12-28 NOTE — Patient Instructions (Signed)
Medication Instructions:  Your physician has recommended you make the following change in your medication:  1. Discontinue Asprin.  *If you need a refill on your cardiac medications before your next appointment, please call your pharmacy*   Lab Work: None  If you have labs (blood work) drawn today and your tests are completely normal, you will receive your results only by: Marland Kitchen MyChart Message (if you have MyChart) OR . A paper copy in the mail If you have any lab test that is abnormal or we need to change your treatment, we will call you to review the results.   Testing/Procedures: Your physician has requested that you have an echocardiogram. Echocardiography is a painless test that uses sound waves to create images of your heart. It provides your doctor with information about the size and shape of your heart and how well your heart's chambers and valves are working. This procedure takes approximately one hour. There are no restrictions for this procedure.   Echocardiogram An echocardiogram is a procedure that uses painless sound waves (ultrasound) to produce an image of the heart. Images from an echocardiogram can provide important information about:  Signs of coronary artery disease (CAD).  Aneurysm detection. An aneurysm is a weak or damaged part of an artery wall that bulges out from the normal force of blood pumping through the body.  Heart size and shape. Changes in the size or shape of the heart can be associated with certain conditions, including heart failure, aneurysm, and CAD.  Heart muscle function.  Heart valve function.  Signs of a past heart attack.  Fluid buildup around the heart.  Thickening of the heart muscle.  A tumor or infectious growth around the heart valves. Tell a health care provider about:  Any allergies you have.  All medicines you are taking, including vitamins, herbs, eye drops, creams, and over-the-counter medicines.  Any blood disorders you  have.  Any surgeries you have had.  Any medical conditions you have.  Whether you are pregnant or may be pregnant. What are the risks? Generally, this is a safe procedure. However, problems may occur, including:  Allergic reaction to dye (contrast) that may be used during the procedure. What happens before the procedure? No specific preparation is needed. You may eat and drink normally. What happens during the procedure?   An IV tube may be inserted into one of your veins.  You may receive contrast through this tube. A contrast is an injection that improves the quality of the pictures from your heart.  A gel will be applied to your chest.  A wand-like tool (transducer) will be moved over your chest. The gel will help to transmit the sound waves from the transducer.  The sound waves will harmlessly bounce off of your heart to allow the heart images to be captured in real-time motion. The images will be recorded on a computer. The procedure may vary among health care providers and hospitals. What happens after the procedure?  You may return to your normal, everyday life, including diet, activities, and medicines, unless your health care provider tells you not to do that. Summary  An echocardiogram is a procedure that uses painless sound waves (ultrasound) to produce an image of the heart.  Images from an echocardiogram can provide important information about the size and shape of your heart, heart muscle function, heart valve function, and fluid buildup around your heart.  You do not need to do anything to prepare before this procedure. You may  eat and drink normally.  After the echocardiogram is completed, you may return to your normal, everyday life, unless your health care provider tells you not to do that. This information is not intended to replace advice given to you by your health care provider. Make sure you discuss any questions you have with your health care  provider. Document Revised: 07/28/2018 Document Reviewed: 05/09/2016 Elsevier Patient Education  [place]: At [facility], you and your health needs are our priority.  As part of our continuing mission to provide you with exceptional heart care, we have created designated Provider Care Teams.  These Care Teams include your primary Cardiologist (physician) and Advanced Practice Providers (APPs -  Physician Assistants and Nurse Practitioners) who all work together to provide you with the care you need, when you need it.  We recommend signing up for the patient portal called "MyChart".  Sign up information is provided on this After Visit Summary.  MyChart is used to connect with patients for Virtual Visits (Telemedicine).  Patients are able to view lab/test results, encounter notes, upcoming appointments, etc.  Non-urgent messages can be sent to your provider as well.   To learn more about what you can do with MyChart, go to [email or web address].    Your next appointment:   6 month(s)  The format for your next appointment:   In Person  Provider:   Dr. Gasper Sells   Other Instructions

## 2019-12-28 NOTE — Progress Notes (Signed)
Cardiology Office Note:    Date:  12/28/2019   ID:  Matthew Savage, DOB February 25, 1961, MRN [id number]  PCP:  Dorothyann Peng, NP  [facility] Cardiologist:  No primary care provider on file.  [facility] Electrophysiologist:  None   Referring MD: Dorothyann Peng, NP   No chief complaint on file. CC: "Making sure I won't drop dead" Consulting for the evaluation of atrial fibrillation at the behest of [employer] NP.   History of Present Illness:    Matthew Savage is a 59 y.o. male with a hx of atrial fibrillation (paroxsymal) atrial flutter (first diagnoses in [place]), essential hypertension, mild aortic stenosis, hyperlipidemia, and tobacco use who presents for evaluation.   Patient notes that had a 3 day sepsis admission with UTI with chills and diaphoresis; incidentally found to have a fib rate of 120; did not feel palpitations at that time.  Now, patient feels fine.  No chest pain, shortness of breath, dyspnea of exertion.  Recently got 1st COVID vaccine.  No palpitations.  Works on Public librarian.  Still does physical work. No palpitations with exertion. No syncope. Leg swelling has resolved with cessation of amlodipine (for metoprolol).   Past Medical History:  Diagnosis Date  . Blood in urine   . DDD (degenerative disc disease)   . DVT (deep venous thrombosis) ([place]) 2017 & 2018  . Gout   . Heart murmur   . Hyperlipidemia   . Hypertension   . Numbness and tingling    right arm and hand  . SCC (squamous cell carcinoma)    skin, squamous cell, face    Past Surgical History:  Procedure Laterality Date  . HERNIA REPAIR  1967   X 2  . LAPAROSCOPIC GASTRIC BANDING  03/2009   in [place], [place]  . MASS EXCISION  12/03/2011   X 2 (gluteal) X 1 (lower back)  . MOHS SURGERY     x 2   Current Medications: Current Meds  Medication Sig  . acetaminophen (TYLENOL) 325 MG tablet Take 650 mg by mouth every 6 (six) hours as needed for mild pain or headache.  .  allopurinol (ZYLOPRIM) 300 MG tablet Take 1 tablet (300 mg total) by mouth daily.  Marland Kitchen atorvastatin (LIPITOR) 40 MG tablet Take 40 mg by mouth at bedtime.  . cetirizine (ZYRTEC) 10 MG tablet Take 10 mg by mouth daily.  Marland Kitchen COLCRYS 0.6 MG tablet TAKE 2 TABLETS NOW, THEN 1 TABLET BY MOUTH DAILY  . erythromycin ophthalmic ointment Place 1 application into the left eye 3 (three) times daily. X 5 days  . metoprolol succinate (TOPROL-XL) 50 MG 24 hr tablet Take 1 tablet (50 mg total) by mouth daily.  . Multiple Vitamin (MULTIVITAMIN) tablet Take 1 tablet by mouth daily.    . rivaroxaban (XARELTO) 20 MG TABS tablet TAKE 1 TABLET BY MOUTH EVERY DAY WITH SUPPER  . [DISCONTINUED] ASPIRIN LOW DOSE 81 MG EC tablet Take 81 mg by mouth daily.     Allergies:   Patient has no known allergies.   Social History   Socioeconomic History  . Marital status: Single    Spouse name: Not on file  . Number of children: Not on file  . Years of education: Not on file  . Highest education level: Not on file  Occupational History  . Not on file  Tobacco Use  . Smoking status: Current Every Day Smoker    Packs/day: 2.00    Years: 36.00  Pack years: 72.00    Types: Cigarettes  . Smokeless tobacco: Never Used  Substance and Sexual Activity  . Alcohol use: Yes    Alcohol/week: 12.0 standard drinks    Types: 12 Standard drinks or equivalent per week    Comment: 2 per day  . Drug use: No  . Sexual activity: Not on file  Other Topics Concern  . 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    Married    No kids   Social Determinants of Health   Financial Resource Strain:   . Difficulty of Paying Living Expenses: Not on file  Food Insecurity:   . Worried About Charity fundraiser in the Last Year: Not on file  . Ran Out of Food in the Last Year: Not on file  Transportation Needs:   . Lack of Transportation (Medical): Not on file  . Lack of Transportation (Non-Medical):  Not on file  Physical Activity:   . Days of Exercise per Week: Not on file  . Minutes of Exercise per Session: Not on file  Stress:   . Feeling of Stress : Not on file  Social Connections:   . Frequency of Communication with Friends and Family: Not on file  . Frequency of Social Gatherings with Friends and Family: Not on file  . Attends Religious Services: Not on file  . Active Member of Clubs or Organizations: Not on file  . Attends Archivist Meetings: Not on file  . Marital Status: Not on file    Family History: The patient's family history includes Breast cancer in his mother; Colon polyps in his brother and father; Hypertension in his father; Other in his mother. Cousin has PE and died.  No hx of Bicuspid Valve in Family.  No hx of SCD.   ROS:   Please see the history of present illness.    All other systems reviewed and are negative.  EKGs/Labs/Other Studies Reviewed:    The following studies were reviewed today:  EKG:  EKG is ordered today.  The ekg ordered today demonstrates atrial flutter with ventricular rate of 94 and Ashman beats vs occasional PVCs 12/22/19 EKG notable for atrial flutter rate 96  Recent Labs: 06/21/2019: TSH 1.58 12/22/2019: ALT 35; BUN 19; Creat 1.08; Hemoglobin 11.6; Platelets 357; Potassium 4.7; Sodium 135  Recent Lipid Panel    Component Value Date/Time   CHOL 169 06/21/2019 0729   TRIG 85.0 06/21/2019 0729   HDL 44.70 06/21/2019 0729   CHOLHDL 4 06/21/2019 0729   VLDL 17.0 06/21/2019 0729   LDLCALC 107 (H) 06/21/2019 0729   LDLDIRECT 135.1 04/06/2011 0835   TSH 06/21/19 1.58 Creatine 1.08 12/22/19 A1c 5.6 12/22/19  12/27/08 Echo Moderately Calcified Aortic Valve with AoV peak velocities 2+ m/s. Normal EF.  Physical Exam:    VS:  BP 110/90 (BP Location: Left Arm, Patient Position: Sitting, Cuff Size: Large)   Pulse 94   Ht 6' (1.829 m)   Wt 290 lb 6.4 oz (131.7 kg)   SpO2 99%   BMI 39.39 kg/m     Wt Readings from Last 3  Encounters:  12/28/19 290 lb 6.4 oz (131.7 kg)  12/22/19 296 lb 2 oz (134.3 kg)  06/06/19 (!) 306 lb (138.8 kg)     GEN: Obese, smells of smoke, well developed in no acute distress HEENT: Unilateral (left) Frank's Sign, otherwise normal NECK: No JVD; No carotid bruits LYMPHATICS: No  lymphadenopathy CARDIAC: RRR, no murmurs, rubs, gallops; soft heart sounds; 2+ radial pulses bilaterally RESPIRATORY:  Good air movement; bilateral expiratory wheezes ABDOMEN: Soft, non-tender, non-distended MUSCULOSKELETAL:  No edema; No deformity  SKIN: Warm and dry NEUROLOGIC:  Alert and oriented x 3 PSYCHIATRIC:  Normal affect   ASSESSMENT:    1. Typical atrial flutter ([place])   2. Essential hypertension   3. Hyperlipidemia, unspecified hyperlipidemia type   4. 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 Mild aortic stenosis by prior echocardiogram   6. TOBACCO USE   7. Morbid obesity ([place])    PLAN:    In order of problems listed above:  1. Atrial Flutter, Atrial Fibrillation CHADSVASC 1 (HTN) - Asymptomatic; discussed rate vs rhythm control strategy; pt amenable to rate control and monitoring of sx prior to DCCV or rhythm control - continue AC for DVT and A fib; will stop aspirin; continue BB 2. Mild Aortic Stenosis - Last gradients ~ 14 mm Hg with trileaflet valve performed 10 years ago; will repeat study (CUS Complete) 3. Essential Hypertension - controlled of current regimen 4. Hyperlipidemia - reasonable control for primary prevention; continue statin 5. Tobacco Use - gave cessation education, patient has cut down use in half 6. OSA - appointment on October 5th for CPAP 7. Morbid Obesity, BMI ~ 42 - gave exercise education; had lost 20 lbs prior to recent hospitalization 8. Hx of DVT on Xarelto - continue AC  Will see in 6 months.   Medication Adjustments/Labs and Tests Ordered: Current medicines are reviewed at length with the patient  today.  Concerns regarding medicines are outlined above.  Orders Placed This Encounter  Procedures  . EKG 12-Lead  . ECHOCARDIOGRAM COMPLETE   No orders of the defined types were placed in this encounter.   Patient Instructions  Medication Instructions:  Your physician has recommended you make the following change in your medication:  1. Discontinue Asprin.  *If you need a refill on your cardiac medications before your next appointment, please call your pharmacy*   Lab Work: None  If you have labs (blood work) drawn today and your tests are completely normal, you will receive your results only by: Marland Kitchen MyChart Message (if you have MyChart) OR . A paper copy in the mail If you have any lab test that is abnormal or we need to change your treatment, we will call you to review the results.   Testing/Procedures: Your physician has requested that you have an echocardiogram. Echocardiography is a painless test that uses sound waves to create images of your heart. It provides your doctor with information about the size and shape of your heart and how well your heart's chambers and valves are working. This procedure takes approximately one hour. There are no restrictions for this procedure.   Echocardiogram An echocardiogram is a procedure that uses painless sound waves (ultrasound) to produce an image of the heart. Images from an echocardiogram can provide important information about:  Signs of coronary artery disease (CAD).  Aneurysm detection. An aneurysm is a weak or damaged part of an artery wall that bulges out from the normal force of blood pumping through the body.  Heart size and shape. Changes in the size or shape of the heart can be associated with certain conditions, including heart failure, aneurysm, and CAD.  Heart muscle function.  Heart valve function.  Signs of a past heart attack.  Fluid buildup around the heart.  Thickening of the heart muscle.  A tumor or  infectious growth around the heart valves. Tell a health care provider about:  Any allergies you have.  All medicines you are taking, including vitamins, herbs, eye drops, creams, and over-the-counter medicines.  Any blood disorders you have.  Any surgeries you have had.  Any medical conditions you have.  Whether you are pregnant or may be pregnant. What are the risks? Generally, this is a safe procedure. However, problems may occur, including:  Allergic reaction to dye (contrast) that may be used during the procedure. What happens before the procedure? No specific preparation is needed. You may eat and drink normally. What happens during the procedure?   An IV tube may be inserted into one of your veins.  You may receive contrast through this tube. A contrast is an injection that improves the quality of the pictures from your heart.  A gel will be applied to your chest.  A wand-like tool (transducer) will be moved over your chest. The gel will help to transmit the sound waves from the transducer.  The sound waves will harmlessly bounce off of your heart to allow the heart images to be captured in real-time motion. The images will be recorded on a computer. The procedure may vary among health care providers and hospitals. What happens after the procedure?  You may return to your normal, everyday life, including diet, activities, and medicines, unless your health care provider tells you not to do that. Summary  An echocardiogram is a procedure that uses painless sound waves (ultrasound) to produce an image of the heart.  Images from an echocardiogram can provide important information about the size and shape of your heart, heart muscle function, heart valve function, and fluid buildup around your heart.  You do not need to do anything to prepare before this procedure. You may eat and drink normally.  After the echocardiogram is completed, you may return to your normal,  everyday life, unless your health care provider tells you not to do that. This information is not intended to replace advice given to you by your health care provider. Make sure you discuss any questions you have with your health care provider. Document Revised: 07/28/2018 Document Reviewed: 05/09/2016 Elsevier Patient Education  [place]: At [facility], you and your health needs are our priority.  As part of our continuing mission to provide you with exceptional heart care, we have created designated Provider Care Teams.  These Care Teams include your primary Cardiologist (physician) and Advanced Practice Providers (APPs -  Physician Assistants and Nurse Practitioners) who all work together to provide you with the care you need, when you need it.  We recommend signing up for the patient portal called "MyChart".  Sign up information is provided on this After Visit Summary.  MyChart is used to connect with patients for Virtual Visits (Telemedicine).  Patients are able to view lab/test results, encounter notes, upcoming appointments, etc.  Non-urgent messages can be sent to your provider as well.   To learn more about what you can do with MyChart, go to [email or web address].    Your next appointment:   6 month(s)  The format for your next appointment:   In Person  Provider:   Dr. Gasper Sells   Other Instructions      Signed, Werner Lean, MD  12/28/2019 10:13 AM    [place]

## 2020-01-01 ENCOUNTER — Encounter: Payer: Self-pay | Admitting: Adult Health

## 2020-01-02 ENCOUNTER — Telehealth (INDEPENDENT_AMBULATORY_CARE_PROVIDER_SITE_OTHER): Payer: BC Managed Care – PPO | Admitting: Adult Health

## 2020-01-02 ENCOUNTER — Other Ambulatory Visit: Payer: Self-pay

## 2020-01-02 ENCOUNTER — Other Ambulatory Visit: Payer: Self-pay | Admitting: Adult Health

## 2020-01-02 ENCOUNTER — Other Ambulatory Visit (INDEPENDENT_AMBULATORY_CARE_PROVIDER_SITE_OTHER): Payer: BC Managed Care – PPO | Admitting: *Deleted

## 2020-01-02 DIAGNOSIS — N3000 Acute cystitis without hematuria: Secondary | ICD-10-CM

## 2020-01-02 MED ORDER — ATORVASTATIN CALCIUM 40 MG PO TABS: 40.0000 mg | ORAL_TABLET | Freq: Every day | ORAL | 1 refills | Status: DC

## 2020-01-02 MED ORDER — CIPROFLOXACIN HCL 500 MG PO TABS: 500.0000 mg | ORAL_TABLET | Freq: Two times a day (BID) | ORAL | 0 refills | Status: AC

## 2020-01-02 NOTE — Progress Notes (Signed)
   Subjective:    Patient ID: Matthew Savage, male    DOB: May 17, 1960, 59 y.o.   MRN: [id number]  HPI    Review of Systems     Objective:   Physical Exam        Assessment & Plan:

## 2020-01-02 NOTE — Progress Notes (Signed)
Virtual Visit via Telephone Note  I connected with Matthew Savage on 01/02/20 at  4:30 PM EDT by telephone and verified that I am speaking with the correct person using two identifiers.   I discussed the limitations, risks, security and privacy concerns of performing an evaluation and management service by telephone and the availability of in person appointments. I also discussed with the patient that there may be a patient responsible charge related to this service. The patient expressed understanding and agreed to proceed.  Location patient: home Location provider: work or home office Participants present for the call: patient, provider Patient did not have a visit in the prior 7 days to address this/these issue(s).   History of Present Illness: 59 year old male who is being evaluated today for concern of a urinary tract infection.  His symptoms started yesterday.  His symptoms include urinary frequency and urgency, low back pain, discomfort with urination but not dysuria, cloudy odorous urine.  He has had a UTI in the past and reports that symptoms of the same.  Denies fevers or chills   Observations/Objective: Patient sounds cheerful and well on the phone. I do not appreciate any SOB. Speech and thought processing are grossly intact. Patient reported vitals:  Assessment and Plan: 1. Acute cystitis without hematuria -He dropped off a urinalysis today but the lab did not run the POC urine dipstick.  They sent a urine culture.  Due to symptoms we will treat until urine culture results. - Culture, Urine - ciprofloxacin (CIPRO) 500 MG tablet; Take 1 tablet (500 mg total) by mouth 2 (two) times daily for 5 days.  Dispense: 10 tablet; Refill: 0   Follow Up Instructions:  I did not refer this patient for an OV in the next 24 hours for this/these issue(s).  I discussed the assessment and treatment plan with the patient. The patient was provided an opportunity to ask questions and all were  answered. The patient agreed with the plan and demonstrated an understanding of the instructions.   The patient was advised to call back or seek an in-person evaluation if the symptoms worsen or if the condition fails to improve as anticipated.  I provided 15 minutes of non-face-to-face time during this encounter.   Dorothyann Peng, NP

## 2020-01-03 LAB — POCT URINALYSIS DIPSTICK
Bilirubin, UA: NEGATIVE
Glucose, UA: NEGATIVE
Ketones, UA: NEGATIVE
Nitrite, UA: POSITIVE
Protein, UA: POSITIVE — AB
Spec Grav, UA: 1.015 (ref 1.010–1.025)
Urobilinogen, UA: 0.2 E.U./dL
pH, UA: 6 (ref 5.0–8.0)

## 2020-01-03 NOTE — Addendum Note (Signed)
Addended by: Marrion Coy on: 01/03/2020 10:33 AM   Modules accepted: Orders

## 2020-01-04 ENCOUNTER — Encounter: Payer: Self-pay | Admitting: Adult Health

## 2020-01-05 LAB — URINE CULTURE
MICRO NUMBER:: 10947994
SPECIMEN QUALITY:: ADEQUATE

## 2020-01-08 ENCOUNTER — Other Ambulatory Visit: Payer: Self-pay | Admitting: Adult Health

## 2020-01-08 DIAGNOSIS — I4891 Unspecified atrial fibrillation: Secondary | ICD-10-CM

## 2020-01-08 DIAGNOSIS — I10 Essential (primary) hypertension: Secondary | ICD-10-CM

## 2020-01-17 ENCOUNTER — Other Ambulatory Visit: Payer: Self-pay

## 2020-01-17 ENCOUNTER — Ambulatory Visit (HOSPITAL_COMMUNITY): Payer: BC Managed Care – PPO | Attending: Internal Medicine

## 2020-01-17 ENCOUNTER — Ambulatory Visit
Admission: RE | Admit: 2020-01-17 | Discharge: 2020-01-17 | Disposition: A | Discharge status: Home / Self Care | Payer: BC Managed Care – PPO | Source: Ambulatory Visit | Attending: Adult Health | Admitting: Adult Health

## 2020-01-17 DIAGNOSIS — I825Z2 Chronic embolism and thrombosis of unspecified deep veins of left distal lower extremity: Secondary | ICD-10-CM | POA: Insufficient documentation

## 2020-01-17 DIAGNOSIS — I350 Nonrheumatic aortic (valve) stenosis: Secondary | ICD-10-CM | POA: Diagnosis not present

## 2020-01-17 DIAGNOSIS — E785 Hyperlipidemia, unspecified: Secondary | ICD-10-CM | POA: Insufficient documentation

## 2020-01-17 DIAGNOSIS — I10 Essential (primary) hypertension: Secondary | ICD-10-CM | POA: Insufficient documentation

## 2020-01-17 DIAGNOSIS — N281 Cyst of kidney, acquired: Secondary | ICD-10-CM

## 2020-01-17 DIAGNOSIS — F17200 Nicotine dependence, unspecified, uncomplicated: Secondary | ICD-10-CM | POA: Diagnosis present

## 2020-01-17 LAB — ECHOCARDIOGRAM COMPLETE
AR max vel: 1.74 cm2
AV Area VTI: 2.39 cm2
AV Area mean vel: 2.14 cm2
AV Mean grad: 13 mmHg
AV Peak grad: 21.2 mmHg
Ao pk vel: 2.3 m/s
S' Lateral: 3.7 cm

## 2020-01-17 MED ORDER — IOPAMIDOL (ISOVUE-300) INJECTION 61%
100.0000 mL | Freq: Once | INTRAVENOUS | Status: AC | PRN
  Administered 2020-01-17: 100 mL via INTRAVENOUS

## 2020-01-18 ENCOUNTER — Encounter: Payer: Self-pay | Admitting: Adult Health

## 2020-01-19 ENCOUNTER — Other Ambulatory Visit: Payer: Self-pay | Admitting: Internal Medicine

## 2020-01-19 ENCOUNTER — Other Ambulatory Visit: Payer: Self-pay

## 2020-01-19 ENCOUNTER — Telehealth: Payer: Self-pay | Admitting: Internal Medicine

## 2020-01-19 DIAGNOSIS — Z01810 Encounter for preprocedural cardiovascular examination: Secondary | ICD-10-CM

## 2020-01-19 DIAGNOSIS — I483 Typical atrial flutter: Secondary | ICD-10-CM

## 2020-01-19 NOTE — Telephone Encounter (Signed)
Called to discussed Stable Mild AS with calcified valve.  Discussed mlld RV enlargement and borderline dysfunction, discussed mildly reduced EF.    Discussed risks and benefits of rhythm control given mrEF.  Patient amenable to trial of DCCV.  Has missed no does of AC.  Would attempt to schedule DCCV 02/05/20 .  Would also send to electrophysiology; given mrEF 1c agents may not be ideal, and may be a candidate for ablation.  Patient had no further questions.  Werner Lean, MD

## 2020-01-19 NOTE — Telephone Encounter (Signed)
Per Dr. Gasper Sells, after he spoke with the pt this morning he agreed to [place] 02/05/20 and to an EP consult thereafter to discuss possible Ablation or other med options.   Cardioversion scheduled 02/05/20 with Dr. Gasper Sells.   Pt to arrive 9:00 am for 10:30 am procedure.   Booking number [id number]  Covid test and labs 02/02/20.   Pt verbalized understanding of his instructions. Instructions letter send to the pt My Chart.

## 2020-01-19 NOTE — Addendum Note (Signed)
Addended by: Stephani Police on: 01/19/2020 10:22 AM   Modules accepted: Orders

## 2020-01-19 NOTE — Progress Notes (Signed)
Ideally for 02/05/20

## 2020-01-22 ENCOUNTER — Encounter (HOSPITAL_COMMUNITY): Payer: Self-pay

## 2020-01-22 ENCOUNTER — Other Ambulatory Visit: Payer: Self-pay

## 2020-01-22 ENCOUNTER — Telehealth: Payer: Self-pay | Admitting: Internal Medicine

## 2020-01-22 ENCOUNTER — Emergency Department (HOSPITAL_COMMUNITY)
Admission: EM | Admit: 2020-01-22 | Discharge: 2020-01-22 | Disposition: A | Discharge status: Home / Self Care | Payer: BC Managed Care – PPO | Attending: Emergency Medicine | Admitting: Emergency Medicine

## 2020-01-22 ENCOUNTER — Emergency Department (HOSPITAL_COMMUNITY): Payer: BC Managed Care – PPO

## 2020-01-22 ENCOUNTER — Encounter: Payer: Self-pay | Admitting: Adult Health

## 2020-01-22 DIAGNOSIS — Z79899 Other long term (current) drug therapy: Secondary | ICD-10-CM | POA: Diagnosis not present

## 2020-01-22 DIAGNOSIS — Z7901 Long term (current) use of anticoagulants: Secondary | ICD-10-CM | POA: Insufficient documentation

## 2020-01-22 DIAGNOSIS — F17210 Nicotine dependence, cigarettes, uncomplicated: Secondary | ICD-10-CM | POA: Diagnosis not present

## 2020-01-22 DIAGNOSIS — R319 Hematuria, unspecified: Secondary | ICD-10-CM

## 2020-01-22 DIAGNOSIS — I483 Typical atrial flutter: Secondary | ICD-10-CM | POA: Diagnosis not present

## 2020-01-22 DIAGNOSIS — R079 Chest pain, unspecified: Secondary | ICD-10-CM | POA: Diagnosis present

## 2020-01-22 DIAGNOSIS — I10 Essential (primary) hypertension: Secondary | ICD-10-CM | POA: Diagnosis not present

## 2020-01-22 DIAGNOSIS — I4892 Unspecified atrial flutter: Secondary | ICD-10-CM

## 2020-01-22 LAB — CBC
HCT: 36.1 % — ABNORMAL LOW (ref 39.0–52.0)
Hemoglobin: 11.8 g/dL — ABNORMAL LOW (ref 13.0–17.0)
MCH: 32.8 pg (ref 26.0–34.0)
MCHC: 32.7 g/dL (ref 30.0–36.0)
MCV: 100.3 fL — ABNORMAL HIGH (ref 80.0–100.0)
Platelets: 283 10*3/uL (ref 150–400)
RBC: 3.6 MIL/uL — ABNORMAL LOW (ref 4.22–5.81)
RDW: 13.2 % (ref 11.5–15.5)
WBC: 11.1 10*3/uL — ABNORMAL HIGH (ref 4.0–10.5)
nRBC: 0 % (ref 0.0–0.2)

## 2020-01-22 LAB — URINALYSIS, ROUTINE W REFLEX MICROSCOPIC
Bacteria, UA: NONE SEEN
Bilirubin Urine: NEGATIVE
Glucose, UA: NEGATIVE mg/dL
Ketones, ur: NEGATIVE mg/dL
Leukocytes,Ua: NEGATIVE
Nitrite: NEGATIVE
Protein, ur: >=300 mg/dL — AB
RBC / HPF: >50 RBC/hpf — ABNORMAL HIGH (ref 0–5)
Specific Gravity, Urine: 1.011 (ref 1.005–1.030)
pH: 6 (ref 5.0–8.0)

## 2020-01-22 LAB — TROPONIN I (HIGH SENSITIVITY)
Troponin I (High Sensitivity): 64 ng/L — ABNORMAL HIGH (ref <18)
Troponin I (High Sensitivity): 52 ng/L — ABNORMAL HIGH (ref <18)

## 2020-01-22 LAB — BASIC METABOLIC PANEL
Anion gap: 11 (ref 5–15)
BUN: 13 mg/dL (ref 6–20)
CO2: 23 mmol/L (ref 22–32)
Calcium: 9.2 mg/dL (ref 8.9–10.3)
Chloride: 101 mmol/L (ref 98–111)
Creatinine, Ser: 1.38 mg/dL — ABNORMAL HIGH (ref 0.61–1.24)
GFR calc Af Amer: >60 mL/min (ref >60)
GFR calc non Af Amer: 56 mL/min — ABNORMAL LOW (ref >60)
Glucose, Bld: 103 mg/dL — ABNORMAL HIGH (ref 70–99)
Potassium: 4.3 mmol/L (ref 3.5–5.1)
Sodium: 135 mmol/L (ref 135–145)

## 2020-01-22 LAB — MAGNESIUM: Magnesium: 1.5 mg/dL — ABNORMAL LOW (ref 1.7–2.4)

## 2020-01-22 MED ORDER — RIVAROXABAN 20 MG PO TABS
20.0000 mg | ORAL_TABLET | Freq: Every day | ORAL | Status: DC
  Filled 2020-01-22: qty 1

## 2020-01-22 MED ORDER — ETOMIDATE 2 MG/ML IV SOLN
10.0000 mg | Freq: Once | INTRAVENOUS | Status: DC
  Filled 2020-01-22: qty 10

## 2020-01-22 MED ORDER — MAGNESIUM OXIDE 400 (241.3 MG) MG PO TABS
400.0000 mg | ORAL_TABLET | Freq: Once | ORAL | Status: AC
  Administered 2020-01-22: 400 mg via ORAL
  Filled 2020-01-22: qty 1

## 2020-01-22 MED ORDER — ETOMIDATE 2 MG/ML IV SOLN
INTRAVENOUS | Status: AC | PRN
  Administered 2020-01-22: 10 mg via INTRAVENOUS

## 2020-01-22 MED ORDER — DILTIAZEM HCL 25 MG/5ML IV SOLN
10.0000 mg | Freq: Once | INTRAVENOUS | Status: AC
  Administered 2020-01-22: 10 mg via INTRAVENOUS
  Filled 2020-01-22: qty 5

## 2020-01-22 NOTE — ED Notes (Addendum)
Pt refused hospital prescribed dose, pt requesting to take home dose of 20mg  Xarelto in front this RN, pt made aware medication is prescribed by hospital. Pt stated " they will charge me for it so I will take my own." See MAR.

## 2020-01-22 NOTE — Discharge Instructions (Signed)
·   Please read and follow all provided instructions.  Your diagnoses today include:  1. Atrial flutter with rapid ventricular response ([place])   2. Hematuria, unspecified type   3. Hypomagnesemia     Tests performed today include:  An EKG of your heart - showed atrial flutter with fast rate  A chest x-ray - shows slight amount of fluid build-up in the lungs  Cardiac enzymes - a blood test for heart muscle damage, was slightly elevated but improving during ED stay  Blood counts and electrolytes - showed slightly low potassium  Urine test - showed blood in the urine, please make sure your primary care doctor knows about this  Vital signs. See below for your results today.   Medications prescribed:   None  Take any prescribed medications only as directed.  Follow-up instructions: Please follow-up with the atrial fibrillation clinic this week. They will likely call you, but please call the office if you do not hear from them by Wednesday morning.   Return instructions:  SEEK IMMEDIATE MEDICAL ATTENTION IF:  You have severe chest pain, especially if the pain is crushing or pressure-like and spreads to the arms, back, neck, or jaw, or if you have sweating, nausea (feeling sick to your stomach), or shortness of breath. THIS IS AN EMERGENCY. Don't wait to see if the pain will go away. Get medical help at once. Call 911 or 0 (operator). DO NOT drive yourself to the hospital.   Your chest pain gets worse and does not go away with rest.   You have an attack of chest pain lasting longer than usual, despite rest and treatment with the medications your caregiver has prescribed.   You wake from sleep with chest pain or shortness of breath.  You feel dizzy or faint.  You have chest pain not typical of your usual pain for which you originally saw your caregiver.   You have any other emergent concerns regarding your health.  Your vital signs today were: BP (!) 147/92    Pulse 92    Temp  98.3 F (36.8 C) (Oral)    Resp 18    Ht 6' (1.829 m)    Wt 131.5 kg    SpO2 98%    BMI 39.33 kg/m  If your blood pressure (BP) was elevated above 135/85 this visit, please have this repeated by your doctor within one month. --------------

## 2020-01-22 NOTE — Telephone Encounter (Signed)
Patient c/o Palpitations:  High priority if patient c/o lightheadedness, shortness of breath, or chest pain  1) How long have you had palpitations/irregular HR/ Afib? Are you having the symptoms now? Has been occurring for past month, having symptoms now.  2) Are you currently experiencing lightheadedness, SOB or CP? Lightheadedness  3) Do you have a history of afib (atrial fibrillation) or irregular heart rhythm? Yes   4) Have you checked your BP or HR? (document readings if available): 157/120 HR 126   5) Are you experiencing any other symptoms? Fatigue

## 2020-01-22 NOTE — ED Notes (Signed)
Lab to add Magnesium level to current sample.

## 2020-01-22 NOTE — Telephone Encounter (Signed)
Pt called to report that he has had an elevated BP and Hr today 157/120 and HR 126... he rechecked while on the phone with me and it was about the same.. pt says he has been very tired and lightheaded... he does not have anyone there with him but after talking with the DOD.. Dr. Radford Pax, pt advised he needs immediate assessment and should be seen in the ED.Marland Kitchen pt will try to get someone to take him and if not he will call EMS. I urged him not to drive himself and pt agreed.

## 2020-01-22 NOTE — ED Notes (Signed)
Spoke with pharmacy regarding Xarelto this RN sent back to with attached note and this RN contact information. This RN informed pharmacy of pt actions. This RN made [place], Agricultural consultant aware as well

## 2020-01-22 NOTE — ED Notes (Signed)
Pt heartrate decreased 101bmp, EKG confirmed afib. Set up for cardioversion at bedside.

## 2020-01-22 NOTE — ED Provider Notes (Addendum)
[facility] EMERGENCY DEPARTMENT Provider Note   CSN: [id number] Arrival date & time: 01/22/20  1744     History Chief Complaint  Patient presents with  . Chest Pain    Matthew Savage is a 59 y.o. male.  Patient with history of DVT several years ago on chronic Xarelto, diagnosis of atrial flutter approximately 1 month ago while in [place], states he was admitted for 3 days, followed-up at atrial fibrillation clinic scheduled for cardioversion on 02/05/2020 -- presents to the emergency department today for evaluation of "feeling bad", high blood pressure and heart rate.  Patient states that he started feeling generally weak and had no energy earlier this afternoon heat.  He checked his blood pressure which was elevated and heart rate which was in the 120s.  He called his cardiologist to told him he needed to come to the emergency department.  Patient denies chest pain or shortness of breath.  No recent fevers, URI symptoms, cough, vomiting, diarrhea.  States that he was recently admitted to the hospital for UTI.  States he may have missed one dose of anticoagulant in past month.         Past Medical History:  Diagnosis Date  . Blood in urine   . DDD (degenerative disc disease)   . DVT (deep venous thrombosis) ([place]) 2017 & 2018  . Gout   . Heart murmur   . Hyperlipidemia   . Hypertension   . Numbness and tingling    right arm and hand  . SCC (squamous cell carcinoma)    skin, squamous cell, face    Patient Active Problem List   Diagnosis Date Noted  . Mild aortic stenosis by prior echocardiogram 12/28/2019  . Typical atrial flutter ([place]) 12/28/2019  . Blood thinned due to long-term anticoagulant use 03/26/2016  . Chronic deep vein thrombosis (DVT) of distal vein of left lower extremity ([place]) 03/26/2016  . Gastric banding status 03/26/2016  . Swelling of right testicle 01/14/2016  . Chronic acne/blackheads of trunk, thighs, head, and neck 11/30/2011  .  Cervical radicular pain 04/06/2011  . TOBACCO USE 01/13/2010  . GOUT 03/28/2008  . Morbid obesity ([place]) 03/28/2008  . Sleep apnea 08/23/2007  . Hyperlipidemia 11/18/2006  . Essential hypertension 11/18/2006  . AORTIC STENOSIS 11/18/2006  . ALLERGIC  RHINITIS 11/18/2006  . [place] DISEASE 11/17/2006    Past Surgical History:  Procedure Laterality Date  . HERNIA REPAIR  1967   X 2  . LAPAROSCOPIC GASTRIC BANDING  03/2009   in [place], [place]  . MASS EXCISION  12/03/2011   X 2 (gluteal) X 1 (lower back)  . MOHS SURGERY     x 2       Family History  Problem Relation Age of Onset  . Breast cancer Mother   . Other Mother        small cell carcinoma  . Hypertension Father   . Colon polyps Father   . Colon polyps Brother     Social History   Tobacco Use  . Smoking status: Current Every Day Smoker    Packs/day: 2.00    Years: 36.00    Pack years: 72.00    Types: Cigarettes  . Smokeless tobacco: Never Used  Substance Use Topics  . Alcohol use: Yes    Alcohol/week: 12.0 standard drinks    Types: 12 Standard drinks or equivalent per week    Comment: 2 per day  . Drug use: No  Home Medications Prior to Admission medications   Medication Sig Start Date End Date Taking? Authorizing Provider  acetaminophen (TYLENOL) 325 MG tablet Take 650 mg by mouth every 6 (six) hours as needed for mild pain or headache.    [provider]  allopurinol (ZYLOPRIM) 300 MG tablet Take 1 tablet (300 mg total) by mouth daily. 06/06/19   Nafziger, Tommi Rumps, NP  atorvastatin (LIPITOR) 40 MG tablet Take 1 tablet (40 mg total) by mouth at bedtime. 01/02/20   Nafziger, Tommi Rumps, NP  cetirizine (ZYRTEC) 10 MG tablet Take 10 mg by mouth daily.    [provider]  COLCRYS 0.6 MG tablet TAKE 2 TABLETS NOW, THEN 1 TABLET BY MOUTH DAILY Patient not taking: Reported on 01/02/2020 02/28/19   Edrick Kins, DPM  erythromycin ophthalmic ointment Place 1 application into the left eye 3  (three) times daily. X 5 days 12/22/19   Dorothyann Peng, NP  metoprolol succinate (TOPROL-XL) 50 MG 24 hr tablet Take 1 tablet (50 mg total) by mouth daily. 12/22/19   Nafziger, Tommi Rumps, NP  Multiple Vitamin (MULTIVITAMIN) tablet Take 1 tablet by mouth daily.      [provider]  nicotine (NICODERM CQ - DOSED IN MG/24 HOURS) 21 mg/24hr patch Place 21 mg onto the skin daily.    [provider]  rivaroxaban (XARELTO) 20 MG TABS tablet TAKE 1 TABLET BY MOUTH EVERY DAY WITH SUPPER 06/06/19   Dorothyann Peng, NP    Allergies    Patient has no known allergies.  Review of Systems   Review of Systems  Constitutional: Positive for fatigue. Negative for diaphoresis and fever.  Eyes: Negative for redness.  Respiratory: Negative for cough and shortness of breath.   Cardiovascular: Negative for chest pain, palpitations and leg swelling.  Gastrointestinal: Negative for abdominal pain, nausea and vomiting.  Genitourinary: Negative for dysuria.  Musculoskeletal: Negative for back pain and neck pain.  Skin: Negative for rash.  Neurological: Positive for weakness (generalized). Negative for syncope and light-headedness.  Psychiatric/Behavioral: The patient is not nervous/anxious.     Physical Exam Updated Vital Signs BP (!) 166/115   Pulse (!) 124   Temp 98.3 F (36.8 C) (Oral)   Resp 16   Ht 6' (1.829 m)   Wt 131.5 kg   SpO2 97%   BMI 39.33 kg/m   Physical Exam Vitals and nursing note reviewed.  Constitutional:      Appearance: He is well-developed. He is not diaphoretic.  HENT:     Head: Normocephalic and atraumatic.     Mouth/Throat:     Mouth: Mucous membranes are not dry.  Eyes:     Conjunctiva/sclera: Conjunctivae normal.  Neck:     Vascular: Normal carotid pulses. No carotid bruit or JVD.     Trachea: Trachea normal. No tracheal deviation.  Cardiovascular:     Rate and Rhythm: Regular rhythm. Tachycardia present.     Pulses: No decreased pulses.     Heart sounds:  Normal heart sounds, S1 normal and S2 normal. Heart sounds not distant. No murmur heard.   Pulmonary:     Effort: Pulmonary effort is normal. No respiratory distress.     Breath sounds: Normal breath sounds. No wheezing, rhonchi or rales.     Comments: No rales. Chest:     Chest wall: No tenderness.  Abdominal:     General: Bowel sounds are normal.     Palpations: Abdomen is soft.     Tenderness: There is no abdominal tenderness.  There is no guarding or rebound.  Musculoskeletal:     Cervical back: Normal range of motion and neck supple. No muscular tenderness.  Skin:    General: Skin is warm and dry.     Coloration: Skin is not pale.  Neurological:     Mental Status: He is alert.     ED Results / Procedures / Treatments   Labs (all labs ordered are listed, but only abnormal results are displayed) Labs Reviewed  BASIC METABOLIC PANEL - Abnormal; Notable for the following components:      Result Value   Glucose, Bld 103 (*)    Creatinine, Ser 1.38 (*)    GFR calc non Af Amer 56 (*)    All other components within normal limits  CBC - Abnormal; Notable for the following components:   WBC 11.1 (*)    RBC 3.60 (*)    Hemoglobin 11.8 (*)    HCT 36.1 (*)    MCV 100.3 (*)    All other components within normal limits  MAGNESIUM - Abnormal; Notable for the following components:   Magnesium 1.5 (*)    All other components within normal limits  URINALYSIS, ROUTINE W REFLEX MICROSCOPIC - Abnormal; Notable for the following components:   Hgb urine dipstick LARGE (*)    Protein, ur >=300 (*)    RBC / HPF >50 (*)    All other components within normal limits  TROPONIN I (HIGH SENSITIVITY) - Abnormal; Notable for the following components:   Troponin I (High Sensitivity) 64 (*)    All other components within normal limits  TROPONIN I (HIGH SENSITIVITY) - Abnormal; Notable for the following components:   Troponin I (High Sensitivity) 52 (*)    All other components within normal limits     EKG None  Radiology DG Chest Portable 1 View  Result Date: 01/22/2020 CLINICAL DATA:  Initial evaluation for acute chest pain. EXAM: PORTABLE CHEST 1 VIEW COMPARISON:  Prior radiograph from 12/18/2008. FINDINGS: Cardiomegaly. Mediastinal silhouette within normal limits. Aortic atherosclerosis. Lungs well inflated. Mild diffuse pulmonary interstitial prominence, suggesting pulmonary interstitial congestion/edema. No pleural effusion. No focal infiltrates. No pneumothorax. No acute osseous finding. IMPRESSION: 1. Cardiomegaly with mild diffuse pulmonary interstitial congestion/edema. 2.  Aortic Atherosclerosis (ICD10-I70.0). Electronically Signed   By: Jeannine Boga M.D.   On: 01/22/2020 18:53    Procedures .Cardioversion  Date/Time: 01/22/2020 8:11 PM Performed by: Carlisle Cater, PA-C Authorized by: Carlisle Cater, PA-C   Consent:    Consent obtained:  Verbal and written   Consent given by:  Patient   Risks discussed:  Cutaneous burn, death, induced arrhythmia and pain   Alternatives discussed:  Rate-control medication Pre-procedure details:    Cardioversion basis:  Emergent   Rhythm:  Atrial flutter   Electrode placement:  Anterior-lateral Patient sedated: Yes. Refer to sedation procedure documentation for details of sedation.  Attempt one:    Cardioversion mode:  Synchronous   Shock (Joules):  150   Shock outcome:  Conversion to normal sinus rhythm Post-procedure details:    Patient status:  Awake   Patient tolerance of procedure:  Tolerated well, no immediate complications   (including critical care time)  Medications Ordered in ED Medications  etomidate (AMIDATE) injection 10 mg (10 mg Intravenous See Procedure Record 01/22/20 1948)  rivaroxaban (XARELTO) tablet 20 mg (20 mg Oral Refused 01/22/20 2024)  diltiazem (CARDIZEM) injection 10 mg (10 mg Intravenous Given 01/22/20 1836)  magnesium oxide (MAG-OX) tablet 400 mg (400 mg Oral Given  01/22/20 2019)  etomidate  (AMIDATE) injection (10 mg Intravenous Given 01/22/20 1941)    ED Course  I have reviewed the triage vital signs and the nursing notes.  Pertinent labs & imaging results that were available during my care of the patient were reviewed by me and considered in my medical decision making (see chart for details).  Patient seen and examined. Work-up initiated. Medications ordered. EKG reviewed at bedside with Dr. Sedonia Small. There are some non-specific ST changes, does not meet STEMI criteria, also denies chest pain. Will discuss with cardiology.   Vital signs reviewed and are as follows: BP (!) 166/115   Pulse (!) 124   Temp 98.3 F (36.8 C) (Oral)   Resp 16   Ht 6' (1.829 m)   Wt 131.5 kg   SpO2 97%   BMI 39.33 kg/m   7:16 PM Spoke with Dr. Meda Coffee. She reviewed patient info/Echo. We discussed missing 1 dose of Xarelto. She thinks patient would likely benefit from cardioversion. I discussed this with patient and he is willing to proceed with procedure if it will help him feel better.   9:11 PM Cardioversion performed, with successful conversion to NSR. Pt tolerated and recovered well. On recheck, he is feeling better.  Troponin 64 >> 52.   BP (!) 147/92   Pulse 92   Temp 98.3 F (36.8 C) (Oral)   Resp 18   Ht 6' (1.829 m)   Wt 131.5 kg   SpO2 98%   BMI 39.33 kg/m   Plan for d/c to home, follow-up at afib clinic later this week. Will leave decision to adjust meds (metoprolol) to clinic as BP only slightly elevated and HR is controlled.  Dr. Meda Coffee was going to arrange for follow-up.   9:26 PM Patient anxious to leave.   CRITICAL CARE Performed by: Carlisle Cater PA-C Total critical care time: 40 minutes Critical care time was exclusive of separately billable procedures and treating other patients. Critical care was necessary to treat or prevent imminent or life-threatening deterioration. Critical care was time spent personally by me on the following activities: development of  treatment plan with patient and/or surrogate as well as nursing, discussions with consultants, evaluation of patient's response to treatment, examination of patient, obtaining history from patient or surrogate, ordering and performing treatments and interventions, ordering and review of laboratory studies, ordering and review of radiographic studies, pulse oximetry and re-evaluation of patient's condition.    CHA2DS2-VASc Score = 1  This indicates a 0.6% annual risk of stroke. The patient's score is based upon: CHF History: 0 HTN History: 1 Diabetes History: 0 Stroke History: 0 Vascular Disease History: 0         MDM Rules/Calculators/A&P                          Patient with atrial flutter with rapid ventricular response with signs of mild failure including edema on x-ray, elevated troponin.  Consulted with cardiology.  Patient will follow up with atrial fibrillation clinic.     Final Clinical Impression(s) / ED Diagnoses Final diagnoses:  Atrial flutter with rapid ventricular response ([place])  Hematuria, unspecified type  Hypomagnesemia    Rx / DC Orders ED Discharge Orders    None       Suann Larry 01/22/20 2127    Maudie Flakes, MD 01/22/20 2343    Carlisle Cater, PA-C 01/30/20 2316    Maudie Flakes, MD 01/31/20 [phone]

## 2020-01-22 NOTE — ED Provider Notes (Signed)
.  Sedation  Date/Time: 01/22/2020 11:41 PM Performed by: Maudie Flakes, MD Authorized by: Maudie Flakes, MD   Consent:    Consent obtained:  Verbal and written   Risks discussed:  Allergic reaction, dysrhythmia, inadequate sedation, nausea, vomiting, respiratory compromise necessitating ventilatory assistance and intubation and prolonged hypoxia resulting in organ damage Universal protocol:    Immediately prior to procedure a time out was called: yes     Patient identity confirmation method:  Arm band, hospital-assigned identification number, verbally with patient and provided demographic data Indications:    Procedure performed:  Cardioversion Pre-sedation assessment:    Time since last food or drink:  4 hours   ASA classification: class 1 - normal, healthy patient     Mallampati score:  I - soft palate, uvula, fauces, pillars visible   Pre-sedation assessments completed and reviewed: airway patency, cardiovascular function, hydration status, mental status, nausea/vomiting, pain level, respiratory function and temperature   Immediate pre-procedure details:    Reviewed: vital signs and relevant labs/tests     Verified: bag valve mask available, emergency equipment available, intubation equipment available, IV patency confirmed, oxygen available and suction available   Procedure details (see MAR for exact dosages):    Preoxygenation:  Nasal cannula   Sedation:  Etomidate   Intended level of sedation: deep   Intra-procedure monitoring:  Blood pressure monitoring, cardiac monitor, continuous pulse oximetry, continuous capnometry, frequent LOC assessments and frequent vital sign checks   Intra-procedure events: none     Total Provider sedation time (minutes):  22 Post-procedure details:    Post-sedation assessments completed and reviewed: airway patency, cardiovascular function, hydration status, mental status, nausea/vomiting, pain level, respiratory function and temperature     Patient  is stable for discharge or admission: yes     Patient tolerance:  Tolerated well, no immediate complications      Maudie Flakes, MD 01/22/20 2343

## 2020-01-22 NOTE — Progress Notes (Signed)
RRT at bedside for the synchronize cardioversion due to sustainable a-fib arrhythmia. Goal of care is to maintain good ventilation and oxygenation and to assure airway patency. After cardioversion patient is now in a sinus rhythm. Patient is currently stable at this time. MD at bedside.

## 2020-01-22 NOTE — Progress Notes (Signed)
ANTICOAGULATION CONSULT NOTE - Initial Consult  Pharmacy Consult for xarelto Indication: atrial fibrillation  No Known Allergies  Patient Measurements: Height: 6' (182.9 cm) Weight: 131.5 kg (290 lb) IBW/kg (Calculated) : 77.6  Vital Signs: Temp: 98.3 F (36.8 C) (10/04 1755) Temp Source: Oral (10/04 1755) BP: 143/94 (10/04 1942) Pulse Rate: 90 (10/04 1942)  Labs: Recent Labs    01/22/20 1759  HGB 11.8*  HCT 36.1*  PLT 283  CREATININE 1.38*  TROPONINIHS 64*    Estimated Creatinine Clearance: 80.9 mL/min (A) (by C-G formula based on SCr of 1.38 mg/dL (H)).   Medical History: Past Medical History:  Diagnosis Date  . Blood in urine   . DDD (degenerative disc disease)   . DVT (deep venous thrombosis) ([place]) 2017 & 2018  . Gout   . Heart murmur   . Hyperlipidemia   . Hypertension   . Numbness and tingling    right arm and hand  . SCC (squamous cell carcinoma)    skin, squamous cell, face   Assessment: 59 year old male presenting with atrial fibrillation and elevated HR. Pt is on xarelto 20mg  by mouth daily at home for afib. Last dose reported by patient was 10/3 @ 1900. Patient underwent cardioversion tonight and xarelto will be resumed tonight as well.  CBC 10/4: Hgb 11.8; Plt 283  Goal of Therapy:   Monitor platelets by anticoagulation protocol: Yes   Plan:  Resume PTA xarelto 20mg  by mouth daily at suppertime Monitor s/sx bleeding, CBC, and clinical progress  Wilson Singer, PharmD PGY1 Pharmacy Resident 01/22/2020 7:55 PM

## 2020-01-22 NOTE — ED Triage Notes (Signed)
Pt reports he started feeling bad this afternoon, used his at home BP machine, BP 157/120, HR 126. Pt called his cardiologist and they advised him to come to the ED. Pt is scheduled for a cardiac ablation 10/18

## 2020-01-23 ENCOUNTER — Ambulatory Visit (INDEPENDENT_AMBULATORY_CARE_PROVIDER_SITE_OTHER): Payer: BC Managed Care – PPO | Admitting: Pulmonary Disease

## 2020-01-23 ENCOUNTER — Encounter: Payer: Self-pay | Admitting: Pulmonary Disease

## 2020-01-23 DIAGNOSIS — F17200 Nicotine dependence, unspecified, uncomplicated: Secondary | ICD-10-CM

## 2020-01-23 DIAGNOSIS — J432 Centrilobular emphysema: Secondary | ICD-10-CM | POA: Insufficient documentation

## 2020-01-23 DIAGNOSIS — G4733 Obstructive sleep apnea (adult) (pediatric): Secondary | ICD-10-CM | POA: Diagnosis not present

## 2020-01-23 NOTE — Progress Notes (Signed)
Subjective:    Patient ID: Matthew Savage, male    DOB: 12-10-1960, 59 y.o.   MRN: [id number]  HPI  Chief Complaint  Patient presents with  . 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  CT angiogram showed emphysema and he was asked to see a sleep doctor on discharge. He was a 2 pack/day smoker, more than 70 pack years , he just quit smoking 22 days ago using a nicotine patch.  He reports intermittent wheezing but this is resolved since he quit smoking.  Dyspnea is mild and at baseline  OSA was diagnosed 2008 and he was given an S8 CPAP machine.  He weighed 400 pounds then.  He underwent lap band and lost about 180 pounds and quit using CPAP machine for a while.  After his recent hospitalization he tried using it again but he does not have straps for his full facemask.  He brings in his machine today -I am unable to get pressure readings of the machine and he does not have a card and they are to obtain a download. Epworth sleepiness score is 18 and he reports sleepiness while sitting and reading, watching TV, lying down rest in the afternoons or sitting quietly after lunch. Bedtime is around 9 PM, sleep latency is about 5 minutes, he sleeps on his left side with 1 pillow, reports 1-2 nocturnal awakenings and is out of bed around 5 AM with dryness of mouth, denies headaches. He has gained about 30 pounds in the last 6 months     Significant tests/ events reviewed  11/2019 CT angio chest [facility]) >> neg PE , mild emphysema    Past Medical History:  Diagnosis Date  . Blood in urine   . DDD (degenerative disc disease)   . DVT (deep venous thrombosis) ([place]) 2017 & 2018  . Gout   . Heart  murmur   . Hyperlipidemia   . Hypertension   . Numbness and tingling    right arm and hand  . SCC (squamous cell carcinoma)    skin, squamous cell, face    Past Surgical History:  Procedure Laterality Date  . HERNIA REPAIR  1967   X 2  . LAPAROSCOPIC GASTRIC BANDING  03/2009   in [place], [place]  . MASS EXCISION  12/03/2011   X 2 (gluteal) X 1 (lower back)  . MOHS SURGERY     x 2    No Known Allergies  Social History   Socioeconomic History  . Marital status: Single    Spouse name: Not on file  . Number of children: Not on file  . Years of education: Not on file  . Highest education level: Not on file  Occupational History  . Not on file  Tobacco Use  . Smoking status: Current Every Day Smoker    Packs/day: 2.00    Years: 36.00    Pack years: 72.00    Types: Cigarettes  . Smokeless tobacco: Never Used  Substance and Sexual Activity  . Alcohol use: Yes    Alcohol/week: 12.0 standard drinks    Types: 12 Standard drinks or equivalent per week    Comment: 2 per day  . Drug use:  No  . Sexual activity: Not on file  Other Topics Concern  . 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    Married    No kids   Social Determinants of Health   Financial Resource Strain:   . Difficulty of Paying Living Expenses: Not on file  Food Insecurity:   . Worried About Charity fundraiser in the Last Year: Not on file  . Ran Out of Food in the Last Year: Not on file  Transportation Needs:   . Lack of Transportation (Medical): Not on file  . Lack of Transportation (Non-Medical): Not on file  Physical Activity:   . Days of Exercise per Week: Not on file  . Minutes of Exercise per Session: Not on file  Stress:   . Feeling of Stress : Not on file  Social Connections:   . Frequency of Communication with Friends and Family: Not on file  . Frequency of Social Gatherings with Friends and Family: Not on file  . Attends Religious Services: Not on  file  . Active Member of Clubs or Organizations: Not on file  . Attends Archivist Meetings: Not on file  . Marital Status: Not on file  Intimate Partner Violence:   . Fear of Current or Ex-Partner: Not on file  . Emotionally Abused: Not on file  . Physically Abused: Not on file  . Sexually Abused: Not on file    Family History  Problem Relation Age of Onset  . Breast cancer Mother   . Other Mother        small cell carcinoma  . Hypertension Father   . Colon polyps Father   . Colon polyps Brother      Review of Systems  Constitutional: negative for anorexia, fevers and sweats  Eyes: negative for irritation, redness and visual disturbance  Ears, nose, mouth, throat, and face: negative for earaches, epistaxis, nasal congestion and sore throat  Respiratory: negative for cough,  sputum and wheezing  Cardiovascular: negative for chest pain,  orthopnea, palpitations and syncope  Gastrointestinal: negative for abdominal pain, constipation, diarrhea, melena, nausea and vomiting  Genitourinary:negative for dysuria, frequency and hematuria  Hematologic/lymphatic: negative for bleeding, easy bruising and lymphadenopathy  Musculoskeletal:negative for arthralgias, muscle weakness and stiff joints  Neurological: negative for coordination problems, gait problems, headaches and weakness  Endocrine: negative for diabetic symptoms including polydipsia, polyuria and weight loss      Objective:   Physical Exam  Gen. Pleasant, obese, in no distress, normal affect ENT - no pallor,icterus, no post nasal drip, class 2-3 airway Neck: No JVD, no thyromegaly, no carotid bruits Lungs: no use of accessory muscles, no dullness to percussion, decreased without rales or rhonchi  Cardiovascular: Rhythm regular, heart sounds  normal, no murmurs or gallops, no peripheral edema Abdomen: soft and non-tender, no hepatosplenomegaly, BS normal. Musculoskeletal: No deformities, no cyanosis or  clubbing Neuro:  alert, non focal, no tremors       Assessment & Plan:

## 2020-01-23 NOTE — Assessment & Plan Note (Addendum)
Noted on CT   Schedule PFTs to check for degree of emphysema Based on this, we can discuss need for medications  He will qualify for lung cancer screening program.  He has already had CT this year and can have low-dose CT in August 2022

## 2020-01-23 NOTE — Telephone Encounter (Signed)
Patient with symptoms and elevated blood pressure, went to ED.  DCCV was expedited and returned to SR per EMR Review.  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

## 2020-01-23 NOTE — Patient Instructions (Signed)
  Schedule home sleep test. Based on this, he will qualify for a new CPAP machine. We will send in prescription for CPAP supplies now.  Schedule PFTs to check for degree of emphysema Based on this, we can discuss need for medications

## 2020-01-23 NOTE — Assessment & Plan Note (Signed)
Schedule home sleep test. Based on this, he will qualify for a new CPAP machine. We will send in prescription for CPAP supplies now.  The pathophysiology of obstructive sleep apnea , it's cardiovascular consequences & modes of treatment including CPAP were discused with the patient in detail & they evidenced understanding.

## 2020-01-23 NOTE — Assessment & Plan Note (Signed)
He has been able to quit for 22 days using nicotine patch. I encouraged him to continue using the patch for up to 3 months, he can slowly taper from 21 mg down to 7 mg every month.  He would be at high risk for relapse

## 2020-01-25 ENCOUNTER — Telehealth: Payer: Self-pay

## 2020-01-25 ENCOUNTER — Telehealth (INDEPENDENT_AMBULATORY_CARE_PROVIDER_SITE_OTHER): Payer: BC Managed Care – PPO | Admitting: Internal Medicine

## 2020-01-25 ENCOUNTER — Other Ambulatory Visit: Payer: Self-pay

## 2020-01-25 VITALS — BP 142/86 | HR 78 | Ht 72.0 in | Wt 295.0 lb

## 2020-01-25 DIAGNOSIS — I4892 Unspecified atrial flutter: Secondary | ICD-10-CM | POA: Diagnosis not present

## 2020-01-25 DIAGNOSIS — I429 Cardiomyopathy, unspecified: Secondary | ICD-10-CM

## 2020-01-25 DIAGNOSIS — I483 Typical atrial flutter: Secondary | ICD-10-CM

## 2020-01-25 DIAGNOSIS — I10 Essential (primary) hypertension: Secondary | ICD-10-CM | POA: Diagnosis not present

## 2020-01-25 DIAGNOSIS — I825Z2 Chronic embolism and thrombosis of unspecified deep veins of left distal lower extremity: Secondary | ICD-10-CM

## 2020-01-25 NOTE — Progress Notes (Signed)
Electrophysiology TeleHealth Note   Due to national recommendations of social distancing due to COVID 19, an audio/video telehealth visit is felt to be most appropriate for this patient at this time.  See MyChart message from today for the patient's consent to telehealth for [facility].   Date:  01/25/2020   ID:  Matthew Savage, DOB 1960-10-24, MRN [id number]  Location: patient's home  Provider location: [address] [place]  Evaluation Performed: Initial Evaluation  PCP:  Dorothyann Peng, NP  Cardiologist:  Werner Lean, MD   Electrophysiologist:  None   Chief Complaint:  Atrial flutter  History of Present Illness:    Matthew Savage is a 59 y.o. male who presents via audio/video conferencing for a telehealth visit today for  Atrial flutter  Initially diagnosed in [place] where presented with urosepsis and atrial flutter--no palpitations, just sick>> admitted Echo as below.  ECG narrative report 8/26 Atrial and Vent  rate 98 suggesting sinus    DCCV Monday with less SOB, fatigue-- sleeping better-- edema also better Planning for sleep study COPD/emphysema  Stopped smoking 24 days ago   L leg edema-- DVT leg    DATE TEST EF   9/18 Echo 55-65% MeanGrad 13  8/21 Echo  55-60%   9/21 Echo  45-50% AStenosis-mild meanGrad 13         Hx of DVT on Rivaroxaban l; recent discontinuation of ASA  EtOH -- 1.5 beers/day   Thromboembolic risk factors (   HTN-1 ) for a CHADSVASc Score of 1    The patient denies symptoms of fevers, chills, cough, or new SOB worrisome for COVID 19.    Past Medical History:  Diagnosis Date  . Blood in urine   . DDD (degenerative disc disease)   . DVT (deep venous thrombosis) ([place]) 2017 & 2018  . Gout   . Heart murmur   . Hyperlipidemia   . Hypertension   . Numbness and tingling    right arm and hand  . SCC (squamous cell carcinoma)    skin, squamous cell, face    Past Surgical History:  Procedure  Laterality Date  . HERNIA REPAIR  1967   X 2  . LAPAROSCOPIC GASTRIC BANDING  03/2009   in [place], [place]  . MASS EXCISION  12/03/2011   X 2 (gluteal) X 1 (lower back)  . MOHS SURGERY     x 2    Current Outpatient Medications  Medication Sig Dispense Refill  . acetaminophen (TYLENOL) 500 MG tablet Take 1,500 mg by mouth at bedtime.    Marland Kitchen allopurinol (ZYLOPRIM) 300 MG tablet Take 1 tablet (300 mg total) by mouth daily. 90 tablet 3  . atorvastatin (LIPITOR) 40 MG tablet Take 1 tablet (40 mg total) by mouth at bedtime. 90 tablet 1  . cetirizine (ZYRTEC) 10 MG tablet Take 10 mg by mouth daily.    Marland Kitchen COLCRYS 0.6 MG tablet TAKE 2 TABLETS NOW, THEN 1 TABLET BY MOUTH DAILY (Patient taking differently: Take 0.6 mg by mouth See admin instructions. Take 2 tablets (1.2 mg) by mouth at onset of gout attack, then take 1 tablet (0.6 mg) daily for 6 or 7 days until symptoms have resolved) 26 tablet 1  . doxycycline (VIBRA-TABS) 100 MG tablet Take 100 mg by mouth 2 (two) times daily.    . Homeopathic Products (LEG CRAMPS PO) Take 2 tablets by mouth at bedtime.    . metoprolol succinate (TOPROL-XL) 50 MG 24  hr tablet Take 1 tablet (50 mg total) by mouth daily. 90 tablet 0  . nicotine (NICODERM CQ - DOSED IN MG/24 HOURS) 21 mg/24hr patch Place 21 mg onto the skin daily.    . rivaroxaban (XARELTO) 20 MG TABS tablet TAKE 1 TABLET BY MOUTH EVERY DAY WITH SUPPER (Patient taking differently: Take 20 mg by mouth daily with supper. ) 90 tablet 3   No current facility-administered medications for this visit.    Allergies:   Patient has no known allergies.   Social History:  The patient  reports that he has been smoking cigarettes. He has a 72.00 pack-year smoking history. He has never used smokeless tobacco. He reports current alcohol use of about 12.0 standard drinks of alcohol per week. He reports that he does not use drugs.   Family History:  The patient's  family history includes Breast cancer in his mother; Colon  polyps in his brother and father; Hypertension in his father; Other in his mother.   ROS:  Please see the history of present illness.   All other systems are personally reviewed and negative.    Exam:    Vital Signs:  BP (!) 142/86   Pulse 78   Ht 6' (1.829 m)   Wt 295 lb (133.8 kg)   BMI 40.01 kg/m     Labs/Other Tests and Data Reviewed:    Recent Labs: 06/21/2019: TSH 1.58 12/22/2019: ALT 35 01/22/2020: BUN 13; Creatinine, Ser 1.38; Hemoglobin 11.8; Magnesium 1.5; Platelets 283; Potassium 4.3; Sodium 135   Wt Readings from Last 3 Encounters:  01/25/20 295 lb (133.8 kg)  01/23/20 295 lb 12.8 oz (134.2 kg)  01/22/20 290 lb (131.5 kg)     Other studies personally reviewed: CT Abdomen was remarkable for "bilateral renal cysts" and "exophytic lesions of the lower poles of the kidneys which measure greater than simple fluid density. Differential considerations include hemorrhagic cysts and renal neoplasm>>CT scan without and with IV contrast is recommended to further evaluate.">> done        01/22/20 sinus 01/22/20 ECG atrial flutter   ASSESSMENT & PLAN:    Atrial flutter  Cardiomyopathy   Morbid obesity--s/p Lap Band  Hypertension   DVT- chronic anticoagulation on Rivaroxaban   Smoking NONE x 24 days  Sleep disordered breathing    Discussed mechanism of atrial flutter and the likelihood of recurrence,  As he is obligated to anticoagulation the major indication is for symptoms or preemptively because of the high likelihood of recurrence.  He is inclined towards ablation as a primary strategy.  With the interval development of LVdysfunction and the recurrence issue, I agree.  Discussed benefits and risks including but not limited to death perforation, heart block bleeding  And possibility of atrial fibrillation  He understands and desires to proceed  Sleep study pending   COVID 19 screen The patient denies symptoms of COVID 19 at this time.  The importance of social  distancing was discussed today.  Follow-up:  Post ablation   Current medicines are reviewed at length with the patient today.   The patient does not have concerns regarding his medicines.  The following changes were made today:  none  Labs/ tests ordered today include: EPS RFCA atrial flutter  -- needs GA but no carto No orders of the defined types were placed in this encounter.     Patient Risk:  after full review of this patients clinical status, I feel that they are at moderate risk at this time.  Today, I have spent 33* minutes with the patient with telehealth technology discussing (As above)  .    Signed, Virl Axe, MD  01/25/2020 2:15 PM     [address] [place]  [phone] (office) [phone] (fax)

## 2020-01-25 NOTE — Telephone Encounter (Signed)
  Patient Consent for Virtual Visit         Matthew Savage has provided verbal consent on 01/25/2020 for a virtual visit (video or telephone).   CONSENT FOR VIRTUAL VISIT FOR:  Matthew Savage  By participating in this virtual visit I agree to the following:  I hereby voluntarily request, consent and authorize [facility] and its employed or contracted physicians, physician assistants, nurse practitioners or other licensed health care professionals (the Practitioner), to provide me with telemedicine health care services (the "Services") as deemed necessary by the treating Practitioner. I acknowledge and consent to receive the Services by the Practitioner via telemedicine. I understand that the telemedicine visit will involve communicating with the Practitioner through live audiovisual communication technology and the disclosure of certain medical information by electronic transmission. I acknowledge that I have been given the opportunity to request an in-person assessment or other available alternative prior to the telemedicine visit and am voluntarily participating in the telemedicine visit.  I understand that I have the right to withhold or withdraw my consent to the use of telemedicine in the course of my care at any time, without affecting my right to future care or treatment, and that the Practitioner or I may terminate the telemedicine visit at any time. I understand that I have the right to inspect all information obtained and/or recorded in the course of the telemedicine visit and may receive copies of available information for a reasonable fee.  I understand that some of the potential risks of receiving the Services via telemedicine include:  Marland Kitchen Delay or interruption in medical evaluation due to technological equipment failure or disruption; . Information transmitted may not be sufficient (e.g. poor resolution of images) to allow for appropriate medical decision making by the Practitioner;  and/or  . In rare instances, security protocols could fail, causing a breach of personal health information.  Furthermore, I acknowledge that it is my responsibility to provide information about my medical history, conditions and care that is complete and accurate to the best of my ability. I acknowledge that Practitioner's advice, recommendations, and/or decision may be based on factors not within their control, such as incomplete or inaccurate data provided by me or distortions of diagnostic images or specimens that may result from electronic transmissions. I understand that the practice of medicine is not an exact science and that Practitioner makes no warranties or guarantees regarding treatment outcomes. I acknowledge that a copy of this consent can be made available to me via my patient portal ([facility]), or I can request a printed copy by calling the office of [facility].    I understand that my insurance will be billed for this visit.   I have read or had this consent read to me. . I understand the contents of this consent, which adequately explains the benefits and risks of the Services being provided via telemedicine.  . I have been provided ample opportunity to ask questions regarding this consent and the Services and have had my questions answered to my satisfaction. . I give my informed consent for the services to be provided through the use of telemedicine in my medical care

## 2020-01-26 ENCOUNTER — Telehealth: Payer: Self-pay | Admitting: Internal Medicine

## 2020-01-26 ENCOUNTER — Encounter: Payer: Self-pay | Admitting: Adult Health

## 2020-01-26 NOTE — Telephone Encounter (Signed)
Patient states he is having a cardioversion 02/05/2020, but he had one 01/22/2020 in the ED. He would like to know if this procedure should be cancelled.

## 2020-01-27 ENCOUNTER — Encounter: Payer: Self-pay | Admitting: Adult Health

## 2020-01-29 ENCOUNTER — Telehealth: Payer: Self-pay | Admitting: Internal Medicine

## 2020-01-29 DIAGNOSIS — I10 Essential (primary) hypertension: Secondary | ICD-10-CM

## 2020-01-29 DIAGNOSIS — Z79899 Other long term (current) drug therapy: Secondary | ICD-10-CM

## 2020-01-29 MED ORDER — LOSARTAN POTASSIUM 25 MG PO TABS: 25.0000 mg | ORAL_TABLET | Freq: Every day | ORAL | 3 refills | Status: DC

## 2020-01-29 NOTE — Addendum Note (Signed)
Addended by: Shellia Cleverly on: 01/29/2020 01:39 PM   Modules accepted: Orders

## 2020-01-29 NOTE — Telephone Encounter (Signed)
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  This would be a great opportunity to start some GDMT to improved his heart function.  We can try losartan 25 mg daily with a BMP in 7 days after this.  It may take a few days to bring down his pressure, but this should help his pressures come down.  Werner Lean, MD

## 2020-01-29 NOTE — Telephone Encounter (Signed)
Follow Up:    Pt is returning a call, he said Lelon Frohlich called him.

## 2020-01-29 NOTE — Telephone Encounter (Signed)
Spoke with patient who wanted to make sure his cardioversion had been cancelled since he is back in [facility].  Advised it was cancelled as requested.   Pt is aware Matthew Savage will be calling to scheduled his ablation as discussed with Dr Caryl Comes.  Pt is also concerned about his BP as below from pt advise request: Hey Doc, A little concerned about my BP since they said they where going to increase the dosage when I was in the ER. Just before I left work with a wrist testor it was 150 over 101, when I got home and relaxed a little I checked it agin with my arm band machine it was 146 over 93. So I am a little concerned that the medicine does need to e increased.  Thanks for your help. Matthew Savage  Pt is asking for Dr Gasper Sells to review and advise on the above.  I did advise pt to use arm blood pressure monitor if possible instead of the wrist monitor and to make sure his batteries are fresh in his monitor.  He take Metoprolol 50 mg daily but reports he was told in the ED recently his dose needed to be increased.  BP as listed above and HRs have been in the 80s and 90s.  He is aware he will be called back with further instructions once information has been reviewed.

## 2020-01-29 NOTE — Telephone Encounter (Signed)
Pt is aware to start Losartan 25 mg daily.  [facility] sent into [facility] ([facility]) and pt will have BMP as ordered 10/19.

## 2020-01-29 NOTE — Telephone Encounter (Signed)
Patient states that he was supposed to get a call in regards to schedule an ablation. Please advise.

## 2020-01-29 NOTE — Telephone Encounter (Signed)
Patient following up. States his heart is back in rhythm.

## 2020-01-29 NOTE — Telephone Encounter (Signed)
Will forward this phone note to Rosann Auerbach, Dr Olin Pia nurse to contact and schedule.

## 2020-01-29 NOTE — Telephone Encounter (Addendum)
LMTCB  Pt had virtual visit with Dr. Caryl Comes 01/25/20 with plans for ablation.   02/05/20 Cardioversion has already been cancelled. Pt had immediate cardioversion in the ED 01/22/20 for Atrial Flutter with RR.   (Labs and COVID test planned prior to Cardioversion has been cancelled)

## 2020-01-30 ENCOUNTER — Telehealth: Payer: Self-pay

## 2020-01-30 NOTE — Telephone Encounter (Signed)
Spoke with pt regarding scheduling A-Flutter ablation.  Pt with questions re: when he would be able to return to work as well as procedure times.  Pt advised he will be out of work x 1 week.  Pt will confirm he has the time to be off from work.  Will confirm with Dr Caryl Comes and notify pt if 02/16/2020 is available and procedure time.  Pt verbalizes understanding and agrees with current plan.

## 2020-02-02 ENCOUNTER — Other Ambulatory Visit: Payer: BC Managed Care – PPO

## 2020-02-02 ENCOUNTER — Other Ambulatory Visit (HOSPITAL_COMMUNITY): Payer: BC Managed Care – PPO

## 2020-02-02 DIAGNOSIS — Z01812 Encounter for preprocedural laboratory examination: Secondary | ICD-10-CM

## 2020-02-02 DIAGNOSIS — I483 Typical atrial flutter: Secondary | ICD-10-CM

## 2020-02-03 ENCOUNTER — Other Ambulatory Visit (HOSPITAL_COMMUNITY): Payer: BC Managed Care – PPO

## 2020-02-05 ENCOUNTER — Encounter (HOSPITAL_COMMUNITY): Payer: Self-pay

## 2020-02-05 ENCOUNTER — Ambulatory Visit (HOSPITAL_COMMUNITY): Admit: 2020-02-05 | Payer: BC Managed Care – PPO | Admitting: Internal Medicine

## 2020-02-05 SURGERY — CARDIOVERSION
Anesthesia: General

## 2020-02-06 ENCOUNTER — Other Ambulatory Visit: Payer: Self-pay

## 2020-02-06 ENCOUNTER — Other Ambulatory Visit: Payer: BC Managed Care – PPO | Admitting: *Deleted

## 2020-02-06 DIAGNOSIS — Z79899 Other long term (current) drug therapy: Secondary | ICD-10-CM

## 2020-02-06 DIAGNOSIS — I10 Essential (primary) hypertension: Secondary | ICD-10-CM

## 2020-02-07 ENCOUNTER — Telehealth: Payer: Self-pay

## 2020-02-07 LAB — BASIC METABOLIC PANEL
BUN/Creatinine Ratio: 14 (ref 9–20)
BUN: 17 mg/dL (ref 6–24)
CO2: 23 mmol/L (ref 20–29)
Calcium: 9.1 mg/dL (ref 8.7–10.2)
Chloride: 103 mmol/L (ref 96–106)
Creatinine, Ser: 1.2 mg/dL (ref 0.76–1.27)
GFR calc Af Amer: 76 mL/min/{1.73_m2} (ref >59)
GFR calc non Af Amer: 66 mL/min/{1.73_m2} (ref >59)
Glucose: 102 mg/dL — ABNORMAL HIGH (ref 65–99)
Potassium: 4.6 mmol/L (ref 3.5–5.2)
Sodium: 139 mmol/L (ref 134–144)

## 2020-02-07 NOTE — Telephone Encounter (Signed)
Spoke to pt to let him know his BP is okay at this time per the readings he gave [place]. He is to stay on current medication plan and verbalized understanding. He will call us back if anything arises or he has further questions/concerns.

## 2020-02-07 NOTE — Telephone Encounter (Signed)
Gave pt lab results, per MD. His BP yesterday was 143/86 and today with wrist monitor it is 135/79. I have let MD know.

## 2020-02-13 ENCOUNTER — Telehealth: Payer: Self-pay

## 2020-02-13 ENCOUNTER — Ambulatory Visit (INDEPENDENT_AMBULATORY_CARE_PROVIDER_SITE_OTHER): Payer: BC Managed Care – PPO | Admitting: Pulmonary Disease

## 2020-02-13 ENCOUNTER — Telehealth: Payer: Self-pay | Admitting: Internal Medicine

## 2020-02-13 ENCOUNTER — Other Ambulatory Visit: Payer: Self-pay

## 2020-02-13 DIAGNOSIS — J432 Centrilobular emphysema: Secondary | ICD-10-CM

## 2020-02-13 DIAGNOSIS — G4733 Obstructive sleep apnea (adult) (pediatric): Secondary | ICD-10-CM | POA: Diagnosis not present

## 2020-02-13 DIAGNOSIS — F17200 Nicotine dependence, unspecified, uncomplicated: Secondary | ICD-10-CM

## 2020-02-13 NOTE — Telephone Encounter (Signed)
Entered this phone call in error

## 2020-02-13 NOTE — Telephone Encounter (Deleted)
   [facility] Pre-operative Risk Assessment    HEARTCARE STAFF: - Please ensure there is not already an duplicate clearance open for this procedure. - Under Visit Info/Reason for Call, type in Other and utilize the format Clearance MM/DD/YY or Clearance TBD. Do not use dashes or single digits. - If request is for dental extraction, please clarify the # of teeth to be extracted.  Request for surgical clearance:  1. What type of surgery is being performed? Inguinal hernia   2. When is this surgery scheduled? TBD   3. What type of clearance is required (medical clearance vs. Pharmacy clearance to hold med vs. Both)? Pharmacy  4. Are there any medications that need to be held prior to surgery and how long? Aspirin   5. Practice name and name of physician performing surgery? [facility]/MD not listed   6. What is the office phone number? [phone]   7.   What is the office fax number? [phone]  8.   Anesthesia type (None, local, MAC, general) ? general   Mady Haagensen 02/13/2020, 4:43 PM  _________________________________________________________________   (provider comments below)

## 2020-02-13 NOTE — Telephone Encounter (Signed)
Can you plz followup with this pt  Thanks SK

## 2020-02-13 NOTE — Progress Notes (Signed)
PFT done today. 

## 2020-02-13 NOTE — Telephone Encounter (Signed)
Hi Dr. Caryl Comes, this is a follow-up question regarding recently scheduled ablation for aflutter. I am not sure who your nurse is or I would send this to him/her.

## 2020-02-13 NOTE — Telephone Encounter (Signed)
° ° °  Jessica from [facility] called, she said pt have questions about instructions for his upcoming ablation, he wanted to know what medication he can and cannot take before procedure. She said to call pt

## 2020-02-14 ENCOUNTER — Institutional Professional Consult (permissible substitution): Payer: BC Managed Care – PPO | Admitting: Internal Medicine

## 2020-02-14 NOTE — Telephone Encounter (Signed)
Attempted phone call to pt.  Left voicemail message for pt to contact RN at [phone]. 

## 2020-02-14 NOTE — Telephone Encounter (Signed)
Patient is returning call.  °

## 2020-02-14 NOTE — Telephone Encounter (Signed)
Spoke with pt who is asking about flying out 3 days after his ablation.  Pt reminded biggest risk for procedure is bleeding and per Dr Caryl Comes would need to wait

## 2020-02-15 ENCOUNTER — Ambulatory Visit: Payer: BC Managed Care – PPO

## 2020-02-15 ENCOUNTER — Other Ambulatory Visit: Payer: Self-pay

## 2020-02-15 DIAGNOSIS — G4733 Obstructive sleep apnea (adult) (pediatric): Secondary | ICD-10-CM

## 2020-02-15 DIAGNOSIS — J432 Centrilobular emphysema: Secondary | ICD-10-CM

## 2020-02-15 DIAGNOSIS — F17200 Nicotine dependence, unspecified, uncomplicated: Secondary | ICD-10-CM

## 2020-02-20 ENCOUNTER — Telehealth: Payer: Self-pay | Admitting: Pulmonary Disease

## 2020-02-20 DIAGNOSIS — G4733 Obstructive sleep apnea (adult) (pediatric): Secondary | ICD-10-CM

## 2020-02-20 NOTE — Telephone Encounter (Signed)
Called and went over HST results per Dr Elsworth Soho. All questions answered and patient expressed understanding. Patient agreeable to autoCPAP settings and order placed per Dr Elsworth Soho. Patient stated he has an old machine and needs a newer one. Will add that to order. Patient has an office visit scheduled with NP on 04/25/2020 and did not want to reschedule or do a sooner appointment at this time due to having too much going on. Will keep the January appointment for now and patient stated he will call us if he can or wants to come in sooner. Nothing further needed at this time.

## 2020-02-20 NOTE — Telephone Encounter (Signed)
HST showed severe OSA with AHI 68/ hr Suggest autoCPAP  5-20 cm, mask of choice OV with me/APP in 6 wks

## 2020-02-21 ENCOUNTER — Telehealth: Payer: Self-pay | Admitting: Internal Medicine

## 2020-02-21 DIAGNOSIS — I10 Essential (primary) hypertension: Secondary | ICD-10-CM

## 2020-02-21 DIAGNOSIS — Z79899 Other long term (current) drug therapy: Secondary | ICD-10-CM

## 2020-02-21 MED ORDER — LOSARTAN POTASSIUM 50 MG PO TABS: 50.0000 mg | ORAL_TABLET | Freq: Every day | ORAL | 3 refills | Status: DC

## 2020-02-21 NOTE — Telephone Encounter (Signed)
I spoke with patient regarding readings below. He first noticed BP was elevated Saturday evening. Was 170/94. On 10/31 it was 170/103. On 11/2 it was 156/94 at 8 AM, 147/83 at 3 PM and 156/82 at 8 PM Yesterday it was 142/92 at 5 PM.  He does not think he had increased salt intake over the weekend. Went to dentist last Wednesday and has infected tooth. Was prescribed amoxicillin which he started on Friday.  Needs to have root canal.  Is having some discomfort from tooth but denies severe pain.  No other recent changes Is taking losartan and Toprol as prescribed. Will forward to Dr Gasper Sells for review/recommendations. Patient prefers my chart communication but is unable to send message to Dr Gasper Sells.  He is able to send message to Dr Caryl Comes.  I told him for now he could send to Dr Caryl Comes with note it is for Dr Gasper Sells and we could forward.  I gave patient number for my chart help desk and he will contact them for assistance.

## 2020-02-21 NOTE — Telephone Encounter (Signed)
New message   Pt would like a call about his BP.   Pt c/o BP issue: STAT if pt c/o blurred vision, one-sided weakness or slurred speech  1. What are your last 5 BP readings? 156/97,142/91,156/82,147/83,156/94  2. Are you having any other symptoms (ex. Dizziness, headache, blurred vision, passed out)? No. Pt said Sunday morning he woke up with a headache and took his BP it was 170/103  3. What is your BP issue? Pt thinks his BP medication needs to be adjusted.

## 2020-02-21 NOTE — Addendum Note (Signed)
Addended by: Thompson Grayer on: 02/21/2020 05:11 PM   Modules accepted: Orders

## 2020-02-21 NOTE — Telephone Encounter (Signed)
Patient notified.  Will send prescription to [facility] in [place].  Appointment made for patient to see Dr Gasper Sells on 02/29/20 at 8:40.

## 2020-02-21 NOTE — Telephone Encounter (Signed)
Left message to call office

## 2020-02-21 NOTE — Telephone Encounter (Signed)
Let's increase his losartan to 50 mg and check a BMP in a 7-10 days.  I had reached out to [place] that he needs a follow up; so lets make sure that he has one where we can discuss this in more detail (ideally on the day he gets his labs).

## 2020-02-22 LAB — PULMONARY FUNCTION TEST
DL/VA % pred: 95 %
DL/VA: 4.05 ml/min/mmHg/L
DLCO cor % pred: 84 %
DLCO cor: 24.27 ml/min/mmHg
DLCO unc % pred: 84 %
DLCO unc: 24.27 ml/min/mmHg
FEF 25-75 Post: 1.63 L/sec
FEF 25-75 Pre: 1.56 L/sec
FEF2575-%Change-Post: 4 %
FEF2575-%Pred-Post: 52 %
FEF2575-%Pred-Pre: 50 %
FEV1-%Change-Post: 0 %
FEV1-%Pred-Post: 66 %
FEV1-%Pred-Pre: 66 %
FEV1-Post: 2.52 L
FEV1-Pre: 2.5 L
FEV1FVC-%Change-Post: 0 %
FEV1FVC-%Pred-Pre: 89 %
FEV6-%Change-Post: 1 %
FEV6-%Pred-Post: 78 %
FEV6-%Pred-Pre: 76 %
FEV6-Post: 3.71 L
FEV6-Pre: 3.65 L
FEV6FVC-%Change-Post: 0 %
FEV6FVC-%Pred-Post: 103 %
FEV6FVC-%Pred-Pre: 103 %
FVC-%Change-Post: 1 %
FVC-%Pred-Post: 75 %
FVC-%Pred-Pre: 74 %
FVC-Post: 3.73 L
FVC-Pre: 3.69 L
Post FEV1/FVC ratio: 68 %
Post FEV6/FVC ratio: 99 %
Pre FEV1/FVC ratio: 68 %
Pre FEV6/FVC Ratio: 99 %
RV % pred: 132 %
RV: 3.01 L
TLC % pred: 91 %
TLC: 6.55 L

## 2020-02-29 ENCOUNTER — Encounter: Payer: Self-pay | Admitting: Internal Medicine

## 2020-02-29 ENCOUNTER — Other Ambulatory Visit: Payer: Self-pay

## 2020-02-29 ENCOUNTER — Ambulatory Visit (INDEPENDENT_AMBULATORY_CARE_PROVIDER_SITE_OTHER): Payer: BC Managed Care – PPO | Admitting: Internal Medicine

## 2020-02-29 ENCOUNTER — Other Ambulatory Visit: Payer: BC Managed Care – PPO | Admitting: *Deleted

## 2020-02-29 VITALS — BP 150/96 | HR 69 | Ht 72.0 in | Wt 293.0 lb

## 2020-02-29 DIAGNOSIS — I5020 Unspecified systolic (congestive) heart failure: Secondary | ICD-10-CM

## 2020-02-29 DIAGNOSIS — I712 Thoracic aortic aneurysm, without rupture, unspecified: Secondary | ICD-10-CM

## 2020-02-29 DIAGNOSIS — E785 Hyperlipidemia, unspecified: Secondary | ICD-10-CM

## 2020-02-29 DIAGNOSIS — I483 Typical atrial flutter: Secondary | ICD-10-CM

## 2020-02-29 DIAGNOSIS — F17200 Nicotine dependence, unspecified, uncomplicated: Secondary | ICD-10-CM

## 2020-02-29 DIAGNOSIS — I10 Essential (primary) hypertension: Secondary | ICD-10-CM | POA: Diagnosis not present

## 2020-02-29 DIAGNOSIS — I350 Nonrheumatic aortic (valve) stenosis: Secondary | ICD-10-CM | POA: Diagnosis not present

## 2020-02-29 DIAGNOSIS — Z79899 Other long term (current) drug therapy: Secondary | ICD-10-CM

## 2020-02-29 LAB — BASIC METABOLIC PANEL
BUN/Creatinine Ratio: 16 (ref 9–20)
BUN: 19 mg/dL (ref 6–24)
CO2: 24 mmol/L (ref 20–29)
Calcium: 9.7 mg/dL (ref 8.7–10.2)
Chloride: 101 mmol/L (ref 96–106)
Creatinine, Ser: 1.22 mg/dL (ref 0.76–1.27)
GFR calc Af Amer: 75 mL/min/{1.73_m2} (ref >59)
GFR calc non Af Amer: 64 mL/min/{1.73_m2} (ref >59)
Glucose: 94 mg/dL (ref 65–99)
Potassium: 4.7 mmol/L (ref 3.5–5.2)
Sodium: 137 mmol/L (ref 134–144)

## 2020-02-29 MED ORDER — ENTRESTO 49-51 MG PO TABS: 1.0000 | ORAL_TABLET | Freq: Two times a day (BID) | ORAL | 11 refills | Status: DC

## 2020-02-29 NOTE — Progress Notes (Signed)
Cardiology Office Note:    Date:  02/29/2020   ID:  Matthew Savage, DOB July 02, 1960, MRN [id number]  PCP:  Matthew Peng, NP  [facility] Cardiologist:  Werner Lean, MD  [place] Electrophysiologist:  None   Referring MD: Matthew Peng, NP   YK:DXIPJA up HTN  History of Present Illness:    Matthew Savage is a 59 y.o. male with a hx of atrial fibrillation (paroxsymal) atrial flutter (first diagnoses in [place]), essential hypertension, mild aortic stenosis, hyperlipidemia, and tobacco use who established at [facility] 12/28/19.  Planned for DCCV but has palpitations and discomfort with expedited ED DCCV.Saw EP and is considering ablation.  Has had ablation schedule for 11/22.  Has had persistent high blood pressure despite starting new medication.  Has had slight decrease in LVEF, since prior imaging (EF 45%).  Had new diagnosis of TAA with dilation of 4.1 cm.    Patient notes that he has had no chest pain, shortness of breath.  Has trouble with stopping smoking but still pushng through.  Notes fatigue after work.  No chest pressure, no PND, no orthopnea.  No Syncope or syncope.  No chest pain.  Ambulatory BP 150/96  Past Medical History:  Diagnosis Date  . Blood in urine   . DDD (degenerative disc disease)   . DVT (deep venous thrombosis) ([place]) 2017 & 2018  . Gout   . Heart murmur   . Hyperlipidemia   . Hypertension   . Numbness and tingling    right arm and hand  . SCC (squamous cell carcinoma)    skin, squamous cell, face    Past Surgical History:  Procedure Laterality Date  . HERNIA REPAIR  1967   X 2  . LAPAROSCOPIC GASTRIC BANDING  03/2009   in [place], [place]  . MASS EXCISION  12/03/2011   X 2 (gluteal) X 1 (lower back)  . MOHS SURGERY     x 2   Current Medications: Current Meds  Medication Sig  . acetaminophen (TYLENOL) 500 MG tablet Take 1,500 mg by mouth at bedtime as needed for moderate pain.   Marland Kitchen allopurinol (ZYLOPRIM) 300 MG tablet Take  1 tablet (300 mg total) by mouth daily.  Marland Kitchen atorvastatin (LIPITOR) 40 MG tablet Take 1 tablet (40 mg total) by mouth at bedtime.  . cetirizine (ZYRTEC) 10 MG tablet Take 10 mg by mouth daily.  . colchicine 0.6 MG tablet Take 0.6 mg by mouth as needed (gout).  . Homeopathic Products (LEG CRAMPS PO) Take 2 tablets by mouth at bedtime.  Marland Kitchen losartan (COZAAR) 50 MG tablet Take 1 tablet (50 mg total) by mouth daily.  . metoprolol succinate (TOPROL-XL) 50 MG 24 hr tablet Take 1 tablet (50 mg total) by mouth daily.  . nicotine (NICODERM CQ - DOSED IN MG/24 HR) 7 mg/24hr patch Place 7 mg onto the skin daily.  . rivaroxaban (XARELTO) 20 MG TABS tablet TAKE 1 TABLET BY MOUTH EVERY DAY WITH SUPPER  . [DISCONTINUED] nicotine (NICODERM CQ - DOSED IN MG/24 HOURS) 14 mg/24hr patch Place 14 mg onto the skin daily.      Allergies:   Levaquin [levofloxacin]   Social History   Socioeconomic History  . Marital status: Single    Spouse name: Not on file  . Number of children: Not on file  . Years of education: Not on file  . Highest education level: Not on file  Occupational History  . Not on file  Tobacco Use  .  Smoking status: Current Every Day Smoker    Packs/day: 2.00    Years: 36.00    Pack years: 72.00    Types: Cigarettes  . Smokeless tobacco: Never Used  Substance and Sexual Activity  . Alcohol use: Yes    Alcohol/week: 12.0 standard drinks    Types: 12 Standard drinks or equivalent per week    Comment: 2 per day  . Drug use: No  . Sexual activity: Not on file  Other Topics Concern  . 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    Married    No kids   Social Determinants of Health   Financial Resource Strain:   . Difficulty of Paying Living Expenses: Not on file  Food Insecurity:   . Worried About Charity fundraiser in the Last Year: Not on file  . Ran Out of Food in the Last Year: Not on file  Transportation Needs:   . Lack of Transportation  (Medical): Not on file  . Lack of Transportation (Non-Medical): Not on file  Physical Activity:   . Days of Exercise per Week: Not on file  . Minutes of Exercise per Session: Not on file  Stress:   . Feeling of Stress : Not on file  Social Connections:   . Frequency of Communication with Friends and Family: Not on file  . Frequency of Social Gatherings with Friends and Family: Not on file  . Attends Religious Services: Not on file  . Active Member of Clubs or Organizations: Not on file  . Attends Archivist Meetings: Not on file  . Marital Status: Not on file    Family History: The patient's family history includes Breast cancer in his mother; Colon polyps in his brother and father; Hypertension in his father; Other in his mother. Cousin has PE and died.  No hx of Bicuspid Valve in Family.  No hx of SCD.   ROS:   Please see the history of present illness.    All other systems reviewed and are negative.  EKGs/Labs/Other Studies Reviewed:    The following studies were reviewed today:  EKG:  EKG is ordered today.  The ekg ordered today demonstrates atrial flutter with ventricular rate of 94 and Ashman beats vs occasional PVCs 12/22/19 EKG notable for atrial flutter rate 96  Recent Labs: 06/21/2019: TSH 1.58 12/22/2019: ALT 35 01/22/2020: Hemoglobin 11.8; Magnesium 1.5; Platelets 283 02/06/2020: BUN 17; Creatinine, Ser 1.20; Potassium 4.6; Sodium 139  Recent Lipid Panel    Component Value Date/Time   CHOL 169 06/21/2019 0729   TRIG 85.0 06/21/2019 0729   HDL 44.70 06/21/2019 0729   CHOLHDL 4 06/21/2019 0729   VLDL 17.0 06/21/2019 0729   LDLCALC 107 (H) 06/21/2019 0729   LDLDIRECT 135.1 04/06/2011 0835   TSH 06/21/19 1.58 Creatine 1.08 12/22/19 A1c 5.6 12/22/19  12/27/08 Echo Moderately Calcified Aortic Valve with AoV peak velocities 2+ m/s. Normal EF.  Physical Exam:    VS:  BP (!) 150/96   Pulse 69   Ht 6' (1.829 m)   Wt 293 lb (132.9 kg)   SpO2 98%   BMI 39.74  kg/m     Wt Readings from Last 3 Encounters:  02/29/20 293 lb (132.9 kg)  01/25/20 295 lb (133.8 kg)  01/23/20 295 lb 12.8 oz (134.2 kg)    GEN: Obese, smells of smoke, well developed in no acute distress HEENT: Unilateral (left) Frank's Sign, otherwise  normal NECK: No JVD; No carotid bruits LYMPHATICS: No lymphadenopathy CARDIAC: RRR, no murmurs, rubs, gallops; soft heart sounds; 2+ radial pulses bilaterally RESPIRATORY:  Good air movement no expiratory wheezes ABDOMEN: Soft, non-tender, non-distended MUSCULOSKELETAL:  No edema; No deformity  SKIN: Warm and dry NEUROLOGIC:  Alert and oriented x 3 PSYCHIATRIC:  Normal affect   ASSESSMENT:    1. HFrEF (heart failure with reduced ejection fraction) ([place])   2. Nonrheumatic aortic valve stenosis   3. Morbid obesity ([place])   4. Essential hypertension   5. Hyperlipidemia, unspecified hyperlipidemia type   6. TOBACCO USE   7. Typical atrial flutter ([place])   8. Thoracic aortic aneurysm without rupture ([facility])    PLAN:    In order of problems listed above:  HFmrEF Mild Aortic Stenosis Morbid Obesity, BMI ~ 42 HTN HLD - Will continue BB and will switch losartan to Entresto- already planned for labs 03/08/20 so we will not need to get additional stick - Last Aortic Gradients  13 mm Hg in 2021  Atrial Flutter, Atrial Fibrillation CHADSVASC 1 (HTN) Hx of DVT on Xarelto - Asymptomatic - s/p DCCV - continue AC for DVT and A fib;  continue BB - Seeing EP discussing ablation - reasonable control for primary prevention; continue statin  Asymptomatic thoracic/abdominal aortic aneurysm - Last at 4.1, rate of growth unknown - Last scan 01/14/20; will get CT as part of ablation work up (EP) and repeat echo in March 2022 - 1st degree relative one time screening for his younger - Discussed not using Fluoroquinolones  Former Tobacco Use - 8 weeks free on a patch  OSA - study is done still awaiting the machine - gave exercise  education; had lost 20 lbs prior to recent hospitalization  3 month (March) follow up unless new symptoms or abnormal test results warranting change in plan  Would be reasonable for  APP Follow up  Discussed at length the risk and benefits of ablation, and of goal directed medical therapy.  Medication Adjustments/Labs and Tests Ordered: Current medicines are reviewed at length with the patient today.  Concerns regarding medicines are outlined above.  No orders of the defined types were placed in this encounter.  No orders of the defined types were placed in this encounter.   There are no Patient Instructions on file for this visit.   Signed, Werner Lean, MD  02/29/2020 9:33 AM    [place]

## 2020-02-29 NOTE — Patient Instructions (Signed)
Medication Instructions:  1) DISCONTINUE Losartan 2) START Entresto 49/51 twice daily.    If any cost concerns with Delene Loll, please call [phone] to get assistance with the cost of this medication.  I have also given you a 30 day free discount card to present to the pharmacy.  I have included a card to see if you qualify for $10 copay as well.  Just follow the instructions on the card.   *If you need a refill on your cardiac medications before your next appointment, please call your pharmacy*   Lab Work: Keep lab appointment on 11/19  If you have labs (blood work) drawn today and your tests are completely normal, you will receive your results only by: Marland Kitchen MyChart Message (if you have MyChart) OR . A paper copy in the mail If you have any lab test that is abnormal or we need to change your treatment, we will call you to review the results.   Testing/Procedures: Your physician has requested that you have an echocardiogram in March 2022. Echocardiography is a painless test that uses sound waves to create images of your heart. It provides your doctor with information about the size and shape of your heart and how well your heart's chambers and valves are working. This procedure takes approximately one hour. There are no restrictions for this procedure.     Follow-Up: At [facility], you and your health needs are our priority.  As part of our continuing mission to provide you with exceptional heart care, we have created designated Provider Care Teams.  These Care Teams include your primary Cardiologist (physician) and Advanced Practice Providers (APPs -  Physician Assistants and Nurse Practitioners) who all work together to provide you with the care you need, when you need it.  We recommend signing up for the patient portal called "MyChart".  Sign up information is provided on this After Visit Summary.  MyChart is used to connect with patients for Virtual Visits (Telemedicine).   Patients are able to view lab/test results, encounter notes, upcoming appointments, etc.  Non-urgent messages can be sent to your provider as well.   To learn more about what you can do with MyChart, go to [email or web address].    Your next appointment:   4 month(s)- sometime after echo is completed  The format for your next appointment:   In Person  Provider:   You may see Werner Lean, MD or one of the following Advanced Practice Providers on your designated Care Team:    Melina Copa, PA-C  Ermalinda Barrios, PA-C    Other Instructions

## 2020-03-08 ENCOUNTER — Ambulatory Visit (INDEPENDENT_AMBULATORY_CARE_PROVIDER_SITE_OTHER): Payer: BC Managed Care – PPO | Admitting: *Deleted

## 2020-03-08 ENCOUNTER — Other Ambulatory Visit: Payer: BC Managed Care – PPO | Admitting: *Deleted

## 2020-03-08 ENCOUNTER — Other Ambulatory Visit: Payer: Self-pay

## 2020-03-08 ENCOUNTER — Other Ambulatory Visit (HOSPITAL_COMMUNITY)
Admission: RE | Admit: 2020-03-08 | Discharge: 2020-03-08 | Disposition: A | Discharge status: Home / Self Care | Payer: BC Managed Care – PPO | Source: Ambulatory Visit | Attending: Internal Medicine | Admitting: Internal Medicine

## 2020-03-08 ENCOUNTER — Telehealth: Payer: Self-pay

## 2020-03-08 VITALS — HR 61 | Ht 72.0 in

## 2020-03-08 DIAGNOSIS — Z01812 Encounter for preprocedural laboratory examination: Secondary | ICD-10-CM | POA: Insufficient documentation

## 2020-03-08 DIAGNOSIS — I10 Essential (primary) hypertension: Secondary | ICD-10-CM | POA: Diagnosis not present

## 2020-03-08 DIAGNOSIS — Z9884 Bariatric surgery status: Secondary | ICD-10-CM | POA: Diagnosis not present

## 2020-03-08 DIAGNOSIS — F17210 Nicotine dependence, cigarettes, uncomplicated: Secondary | ICD-10-CM | POA: Diagnosis not present

## 2020-03-08 DIAGNOSIS — Z86718 Personal history of other venous thrombosis and embolism: Secondary | ICD-10-CM | POA: Diagnosis not present

## 2020-03-08 DIAGNOSIS — Z79899 Other long term (current) drug therapy: Secondary | ICD-10-CM | POA: Diagnosis not present

## 2020-03-08 DIAGNOSIS — I483 Typical atrial flutter: Secondary | ICD-10-CM

## 2020-03-08 DIAGNOSIS — Z7901 Long term (current) use of anticoagulants: Secondary | ICD-10-CM | POA: Diagnosis not present

## 2020-03-08 DIAGNOSIS — Z6839 Body mass index (BMI) 39.0-39.9, adult: Secondary | ICD-10-CM | POA: Diagnosis not present

## 2020-03-08 DIAGNOSIS — Z881 Allergy status to other antibiotic agents status: Secondary | ICD-10-CM | POA: Diagnosis not present

## 2020-03-08 DIAGNOSIS — I429 Cardiomyopathy, unspecified: Secondary | ICD-10-CM | POA: Diagnosis not present

## 2020-03-08 DIAGNOSIS — G4730 Sleep apnea, unspecified: Secondary | ICD-10-CM | POA: Diagnosis not present

## 2020-03-08 DIAGNOSIS — Z20822 Contact with and (suspected) exposure to covid-19: Secondary | ICD-10-CM | POA: Insufficient documentation

## 2020-03-08 DIAGNOSIS — I4892 Unspecified atrial flutter: Secondary | ICD-10-CM | POA: Diagnosis not present

## 2020-03-08 LAB — BASIC METABOLIC PANEL
BUN/Creatinine Ratio: 20 (ref 9–20)
BUN: 24 mg/dL (ref 6–24)
CO2: 24 mmol/L (ref 20–29)
Calcium: 9.2 mg/dL (ref 8.7–10.2)
Chloride: 102 mmol/L (ref 96–106)
Creatinine, Ser: 1.23 mg/dL (ref 0.76–1.27)
GFR calc Af Amer: 74 mL/min/{1.73_m2} (ref >59)
GFR calc non Af Amer: 64 mL/min/{1.73_m2} (ref >59)
Glucose: 93 mg/dL (ref 65–99)
Potassium: 4.6 mmol/L (ref 3.5–5.2)
Sodium: 138 mmol/L (ref 134–144)

## 2020-03-08 LAB — CBC
Hematocrit: 37.4 % — ABNORMAL LOW (ref 37.5–51.0)
Hemoglobin: 12.5 g/dL — ABNORMAL LOW (ref 13.0–17.7)
MCH: 33.2 pg — ABNORMAL HIGH (ref 26.6–33.0)
MCHC: 33.4 g/dL (ref 31.5–35.7)
MCV: 99 fL — ABNORMAL HIGH (ref 79–97)
Platelets: 286 10*3/uL (ref 150–450)
RBC: 3.77 x10E6/uL — ABNORMAL LOW (ref 4.14–5.80)
RDW: 13 % (ref 11.6–15.4)
WBC: 7.7 10*3/uL (ref 3.4–10.8)

## 2020-03-08 LAB — SARS CORONAVIRUS 2 (TAT 6-24 HRS): SARS Coronavirus 2: NEGATIVE

## 2020-03-08 NOTE — Patient Instructions (Signed)
Medication Instructions:  Your physician recommends that you continue on your current medications as directed. Please refer to the Current Medication list given to you today.  *If you need a refill on your cardiac medications before your next appointment, please call your pharmacy*  Lab Work: None ordered.  If you have labs (blood work) drawn today and your tests are completely normal, you will receive your results only by: Marland Kitchen MyChart Message (if you have MyChart) OR . A paper copy in the mail If you have any lab test that is abnormal or we need to change your treatment, we will call you to review the results.  Testing/Procedures: None ordered.  Follow-Up: At [facility], you and your health needs are our priority.  As part of our continuing mission to provide you with exceptional heart care, we have created designated Provider Care Teams.  These Care Teams include your primary Cardiologist (physician) and Advanced Practice Providers (APPs -  Physician Assistants and Nurse Practitioners) who all work together to provide you with the care you need, when you need it.  We recommend signing up for the patient portal called "MyChart".  Sign up information is provided on this After Visit Summary.  MyChart is used to connect with patients for Virtual Visits (Telemedicine).  Patients are able to view lab/test results, encounter notes, upcoming appointments, etc.  Non-urgent messages can be sent to your provider as well.   To learn more about what you can do with MyChart, go to [email or web address].       Other Instructions: Rosann Auerbach will call you later today after Dr. Caryl Comes reviews your EKG.

## 2020-03-08 NOTE — Progress Notes (Signed)
Reason for visit: EKG prior to ablation.   1.) Name of MD requesting visit: Dr. Caryl Comes   2.) H&P: Patient had telemedicine visit with Dr. Caryl Comes on 01/25/20 for a chief complaint of Atrial Flutter.   3.) ROS related to problem: Planned Atrial flutter ablation on 03/11/20.  4.) Assessment and plan per MD: EKG given to [facility] RN, Dr. Olin Pia nurse to review with Dr. Caryl Comes in clinic today. Rosann Auerbach to call the patient after review with advisement. Patient aware.

## 2020-03-08 NOTE — Telephone Encounter (Signed)
Spoke with pt and advised per Dr Caryl Comes ECG from today's nurse visit shows NSR.  Pt may hold his Xarelto beginning tomorrow and Sunday prior to his A-Flutter ablation scheduled for Monday 03/11/2020.  Pt verbalizes understanding and agrees with current plan.

## 2020-03-11 ENCOUNTER — Encounter (HOSPITAL_COMMUNITY): Payer: Self-pay | Admitting: Internal Medicine

## 2020-03-11 ENCOUNTER — Ambulatory Visit (HOSPITAL_COMMUNITY): Payer: BC Managed Care – PPO | Admitting: Anesthesiology

## 2020-03-11 ENCOUNTER — Other Ambulatory Visit: Payer: Self-pay

## 2020-03-11 ENCOUNTER — Ambulatory Visit (HOSPITAL_COMMUNITY)
Admission: RE | Admit: 2020-03-11 | Discharge: 2020-03-11 | Disposition: A | Discharge status: Home / Self Care | Payer: BC Managed Care – PPO | Attending: Internal Medicine | Admitting: Internal Medicine

## 2020-03-11 ENCOUNTER — Encounter (HOSPITAL_COMMUNITY)
Admission: RE | Disposition: A | Discharge status: Home / Self Care | Payer: BC Managed Care – PPO | Source: Home / Self Care | Attending: Internal Medicine

## 2020-03-11 DIAGNOSIS — F17210 Nicotine dependence, cigarettes, uncomplicated: Secondary | ICD-10-CM | POA: Insufficient documentation

## 2020-03-11 DIAGNOSIS — Z6839 Body mass index (BMI) 39.0-39.9, adult: Secondary | ICD-10-CM | POA: Diagnosis not present

## 2020-03-11 DIAGNOSIS — Z881 Allergy status to other antibiotic agents status: Secondary | ICD-10-CM | POA: Insufficient documentation

## 2020-03-11 DIAGNOSIS — I4892 Unspecified atrial flutter: Secondary | ICD-10-CM | POA: Diagnosis not present

## 2020-03-11 DIAGNOSIS — Z7901 Long term (current) use of anticoagulants: Secondary | ICD-10-CM | POA: Insufficient documentation

## 2020-03-11 DIAGNOSIS — Z86718 Personal history of other venous thrombosis and embolism: Secondary | ICD-10-CM | POA: Insufficient documentation

## 2020-03-11 DIAGNOSIS — I10 Essential (primary) hypertension: Secondary | ICD-10-CM | POA: Diagnosis not present

## 2020-03-11 DIAGNOSIS — Z20822 Contact with and (suspected) exposure to covid-19: Secondary | ICD-10-CM | POA: Insufficient documentation

## 2020-03-11 DIAGNOSIS — Z79899 Other long term (current) drug therapy: Secondary | ICD-10-CM | POA: Insufficient documentation

## 2020-03-11 DIAGNOSIS — G4730 Sleep apnea, unspecified: Secondary | ICD-10-CM | POA: Insufficient documentation

## 2020-03-11 DIAGNOSIS — Z9884 Bariatric surgery status: Secondary | ICD-10-CM | POA: Insufficient documentation

## 2020-03-11 DIAGNOSIS — I429 Cardiomyopathy, unspecified: Secondary | ICD-10-CM | POA: Insufficient documentation

## 2020-03-11 HISTORY — PX: A-FLUTTER ABLATION: EP1230

## 2020-03-11 SURGERY — A-FLUTTER ABLATION
Anesthesia: General

## 2020-03-11 MED ORDER — DEXAMETHASONE SODIUM PHOSPHATE 10 MG/ML IJ SOLN
INTRAMUSCULAR | Status: DC | PRN
  Administered 2020-03-11: 10 mg via INTRAVENOUS

## 2020-03-11 MED ORDER — HEPARIN (PORCINE) IN NACL 1000-0.9 UT/500ML-% IV SOLN
INTRAVENOUS | Status: AC
  Filled 2020-03-11: qty 1000

## 2020-03-11 MED ORDER — SODIUM CHLORIDE 0.9 % IV SOLN: 250.0000 mL | INTRAVENOUS | Status: DC | PRN

## 2020-03-11 MED ORDER — SODIUM CHLORIDE 0.9% FLUSH: 3.0000 mL | Freq: Two times a day (BID) | INTRAVENOUS | Status: DC

## 2020-03-11 MED ORDER — SODIUM CHLORIDE 0.9% FLUSH: 3.0000 mL | INTRAVENOUS | Status: DC | PRN

## 2020-03-11 MED ORDER — HEPARIN (PORCINE) IN NACL 1000-0.9 UT/500ML-% IV SOLN
INTRAVENOUS | Status: DC | PRN
  Administered 2020-03-11 (×2): 500 mL

## 2020-03-11 MED ORDER — ONDANSETRON HCL 4 MG/2ML IJ SOLN: 4.0000 mg | Freq: Four times a day (QID) | INTRAMUSCULAR | Status: DC | PRN

## 2020-03-11 MED ORDER — SUGAMMADEX SODIUM 200 MG/2ML IV SOLN
INTRAVENOUS | Status: DC | PRN
  Administered 2020-03-11: 400 mg via INTRAVENOUS

## 2020-03-11 MED ORDER — FENTANYL CITRATE (PF) 250 MCG/5ML IJ SOLN
INTRAMUSCULAR | Status: DC | PRN
  Administered 2020-03-11: 100 ug via INTRAVENOUS

## 2020-03-11 MED ORDER — PROPOFOL 10 MG/ML IV BOLUS
INTRAVENOUS | Status: DC | PRN
  Administered 2020-03-11: 170 mg via INTRAVENOUS

## 2020-03-11 MED ORDER — BUPIVACAINE HCL (PF) 0.25 % IJ SOLN
INTRAMUSCULAR | Status: AC
  Filled 2020-03-11: qty 30

## 2020-03-11 MED ORDER — ONDANSETRON HCL 4 MG/2ML IJ SOLN
INTRAMUSCULAR | Status: DC | PRN
  Administered 2020-03-11: 4 mg via INTRAVENOUS

## 2020-03-11 MED ORDER — PHENYLEPHRINE HCL-NACL 10-0.9 MG/250ML-% IV SOLN
INTRAVENOUS | Status: DC | PRN
  Administered 2020-03-11: 50 ug/min via INTRAVENOUS

## 2020-03-11 MED ORDER — SODIUM CHLORIDE 0.9 % IV SOLN: INTRAVENOUS | Status: DC

## 2020-03-11 MED ORDER — LIDOCAINE 2% (20 MG/ML) 5 ML SYRINGE
INTRAMUSCULAR | Status: DC | PRN
  Administered 2020-03-11: 100 mg via INTRAVENOUS

## 2020-03-11 MED ORDER — ROCURONIUM BROMIDE 10 MG/ML (PF) SYRINGE
PREFILLED_SYRINGE | INTRAVENOUS | Status: DC | PRN
  Administered 2020-03-11: 30 mg via INTRAVENOUS
  Administered 2020-03-11: 50 mg via INTRAVENOUS

## 2020-03-11 MED ORDER — SUCCINYLCHOLINE CHLORIDE 20 MG/ML IJ SOLN
INTRAMUSCULAR | Status: DC | PRN
  Administered 2020-03-11: 140 mg via INTRAVENOUS

## 2020-03-11 MED ORDER — PHENYLEPHRINE 40 MCG/ML (10ML) SYRINGE FOR IV PUSH (FOR BLOOD PRESSURE SUPPORT)
PREFILLED_SYRINGE | INTRAVENOUS | Status: DC | PRN
  Administered 2020-03-11: 80 ug via INTRAVENOUS
  Administered 2020-03-11 (×2): 120 ug via INTRAVENOUS
  Administered 2020-03-11: 80 ug via INTRAVENOUS
  Administered 2020-03-11: 120 ug via INTRAVENOUS

## 2020-03-11 MED ORDER — ACETAMINOPHEN 325 MG PO TABS: 650.0000 mg | ORAL_TABLET | ORAL | Status: DC | PRN

## 2020-03-11 MED ORDER — BUPIVACAINE HCL (PF) 0.25 % IJ SOLN
INTRAMUSCULAR | Status: DC | PRN
  Administered 2020-03-11: 30 mL

## 2020-03-11 SURGICAL SUPPLY — 13 items
CATH BLAZERPRIME XP LG CV 10MM (ABLATOR) ×2 IMPLANT
CATH DUODECA HALO/ISMUS 7FR (CATHETERS) ×2 IMPLANT
CATH OCTAPOLOR 6F 125CM 2-5-2 (CATHETERS) ×2 IMPLANT
CATH QUAD COURNAND 5FR REPROC (CATHETERS) ×2 IMPLANT
MAT PREVALON FULL STRYKER (MISCELLANEOUS) ×2 IMPLANT
PACK EP LATEX FREE (CUSTOM PROCEDURE TRAY) ×3
PACK EP LF (CUSTOM PROCEDURE TRAY) ×1 IMPLANT
PAD PRO RADIOLUCENT 2001M-C (PAD) ×3 IMPLANT
SHEATH ATRIAL FLUTTER SAFL 8F (SHEATH) ×2 IMPLANT
SHEATH PINNACLE 6F 10CM (SHEATH) ×2 IMPLANT
SHEATH PINNACLE 7F 10CM (SHEATH) ×2 IMPLANT
SHEATH PINNACLE 8F 10CM (SHEATH) ×4 IMPLANT
SHEATH PROBE COVER 6X72 (BAG) ×2 IMPLANT

## 2020-03-11 NOTE — Discharge Instructions (Signed)
Post procedure care instructions No driving for 4 days. No lifting over 5 lbs for 1 week. No vigorous or sexual activity for 1 week. You may return to work/your usual activities on 03/18/2020. Keep procedure site clean & dry. If you notice increased pain, swelling, bleeding or pus, call/return!  You may shower, but no soaking baths/hot tubs/pools for 1 week.    Cardiac Ablation, Care After  This sheet gives you information about how to care for yourself after your procedure. Your health care provider may also give you more specific instructions. If you have problems or questions, contact your health care provider. What can I expect after the procedure? After the procedure, it is common to have:  Bruising around your puncture site.  Tenderness around your puncture site.  Skipped heartbeats.  Tiredness (fatigue).  Follow these instructions at home: Puncture site care   Follow instructions from your health care provider about how to take care of your puncture site. Make sure you: ? If present, leave stitches (sutures), skin glue, or adhesive strips in place. These skin closures may need to stay in place for up to 2 weeks. If adhesive strip edges start to loosen and curl up, you may trim the loose edges. Do not remove adhesive strips completely unless your health care provider tells you to do that. ? If a large square bandage is present, this may be removed 24 hours after surgery.   Check your puncture site every day for signs of infection. Check for: ? Redness, swelling, or pain. ? Fluid or blood. If your puncture site starts to bleed, lie down on your back, apply firm pressure to the area, and contact your health care provider. ? Warmth. ? Pus or a bad smell. Driving  Do not drive for at least 4 days after your procedure or however long your health care provider recommends. (Do not resume driving if you have previously been instructed not to drive for other health reasons.)  Do not  drive or use heavy machinery while taking prescription pain medicine. Activity  Avoid activities that take a lot of effort for at least 7 days after your procedure.  Do not lift anything that is heavier than 5 lb (4.5 kg) for one week.   No sexual activity for 1 week.   Return to your normal activities as told by your health care provider. Ask your health care provider what activities are safe for you. General instructions  Take over-the-counter and prescription medicines only as told by your health care provider.  Do not use any products that contain nicotine or tobacco, such as cigarettes and e-cigarettes. If you need help quitting, ask your health care provider.  You may shower after 24 hours, but Do not take baths, swim, or use a hot tub for 1 week.   Do not drink alcohol for 24 hours after your procedure.  Keep all follow-up visits as told by your health care provider. This is important. Contact a health care provider if:  You have redness, mild swelling, or pain around your puncture site.  You have fluid or blood coming from your puncture site that stops after applying firm pressure to the area.  Your puncture site feels warm to the touch.  You have pus or a bad smell coming from your puncture site.  You have a fever.  You have chest pain or discomfort that spreads to your neck, jaw, or arm.  You are sweating a lot.  You feel nauseous.  You  have a fast or irregular heartbeat.  You have shortness of breath.  You are dizzy or light-headed and feel the need to lie down.  You have pain or numbness in the arm or leg closest to your puncture site. Get help right away if:  Your puncture site suddenly swells.  Your puncture site is bleeding and the bleeding does not stop after applying firm pressure to the area. These symptoms may represent a serious problem that is an emergency. Do not wait to see if the symptoms will go away. Get medical help right away. Call your  local emergency services (911 in the [place] Do not drive yourself to the hospital. Summary  After the procedure, it is normal to have bruising and tenderness at the puncture site in your groin, neck, or forearm.  Check your puncture site every day for signs of infection.  Get help right away if your puncture site is bleeding and the bleeding does not stop after applying firm pressure to the area. This is a medical emergency. This information is not intended to replace advice given to you by your health care provider. Make sure you discuss any questions you have with your health care provider.

## 2020-03-11 NOTE — Progress Notes (Signed)
Site area: rt groin then left groin venous sheaths Site Prior to Removal:  Level 0 Pressure Applied For: 10 minutes each side Manual:   yes Patient Status During Pull:  stable Post Pull Site:  Level 0 Post Pull Instructions Given:  yes Post Pull Pulses Present: NA Dressing Applied:  Gauze and tegaderm bilaterally Bedrest begins @ 1025 Comments: IV saline locked

## 2020-03-11 NOTE — Anesthesia Postprocedure Evaluation (Signed)
Anesthesia Post Note  Patient: Matthew Savage  Procedure(s) Performed: A-FLUTTER ABLATION (N/A )     Patient location during evaluation: Cath Lab Anesthesia Type: General Level of consciousness: awake and alert Pain management: pain level controlled Vital Signs Assessment: post-procedure vital signs reviewed and stable Respiratory status: spontaneous breathing, nonlabored ventilation, respiratory function stable and patient connected to nasal cannula oxygen Cardiovascular status: blood pressure returned to baseline and stable Postop Assessment: no apparent nausea or vomiting Anesthetic complications: no   No complications documented.  Last Vitals:  Vitals:   03/11/20 0955 03/11/20 1000  BP: (!) 160/85 (!) 143/86  Pulse: 74 73  Resp: 13 11  Temp:    SpO2: 97% 97%    Last Pain:  Vitals:   03/11/20 0947  TempSrc: Temporal  PainSc: 0-No pain                 Catalina Gravel

## 2020-03-11 NOTE — H&P (Signed)
Patient Care Team: Dorothyann Peng, NP as PCP - General (Family Medicine) Werner Lean, MD as PCP - Cardiology (Cardiology) Pyrtle, Lajuan Lines, MD as Consulting Physician (Gastroenterology) Lester Kinsman (Dermatology)   HPI  Matthew Savage is a 59 y.o. male  Admitted for atrial flutter ablation  COPD/Empyhsemia obesity and lap band  No children  Airline maintenance--works for [employer]      DATE TEST EF   9/18 Echo 55-65% MeanGrad 13  8/21 Echo  55-60%   9/21 Echo  45-50% AStenosis-mild meanGrad 13         Records and Results Reviewed   Past Medical History:  Diagnosis Date  . Blood in urine   . DDD (degenerative disc disease)   . DVT (deep venous thrombosis) ([place]) 2017 & 2018  . Gout   . Heart murmur   . Hyperlipidemia   . Hypertension   . Numbness and tingling    right arm and hand  . SCC (squamous cell carcinoma)    skin, squamous cell, face    Past Surgical History:  Procedure Laterality Date  . HERNIA REPAIR  1967   X 2  . LAPAROSCOPIC GASTRIC BANDING  03/2009   in [place], [place]  . MASS EXCISION  12/03/2011   X 2 (gluteal) X 1 (lower back)  . MOHS SURGERY     x 2    Current Facility-Administered Medications  Medication Dose Route Frequency Provider Last Rate Last Admin  . 0.9 %  sodium chloride infusion   Intravenous Continuous Deboraha Sprang, MD 50 mL/hr at 03/11/20 0553 New Bag at 03/11/20 0553    Allergies  Allergen Reactions  . Levaquin [Levofloxacin]     Aortic Aneurysm      Social History   Tobacco Use  . Smoking status: Current Every Day Smoker    Packs/day: 2.00    Years: 36.00    Pack years: 72.00    Types: Cigarettes  . Smokeless tobacco: Never Used  Substance Use Topics  . Alcohol use: Yes    Alcohol/week: 12.0 standard drinks    Types: 12 Standard drinks or equivalent per week    Comment: 2 per day  . Drug use: No     Family History  Problem Relation Age of Onset  . Breast cancer Mother     . Other Mother        small cell carcinoma  . Hypertension Father   . Colon polyps Father   . Colon polyps Brother      Current Meds  Medication Sig  . acetaminophen (TYLENOL) 500 MG tablet Take 1,500 mg by mouth at bedtime as needed for mild pain or moderate pain.   Marland Kitchen allopurinol (ZYLOPRIM) 300 MG tablet Take 1 tablet (300 mg total) by mouth daily.  Marland Kitchen atorvastatin (LIPITOR) 40 MG tablet Take 1 tablet (40 mg total) by mouth at bedtime.  . cetirizine (ZYRTEC) 10 MG tablet Take 10 mg by mouth daily.  . metoprolol succinate (TOPROL-XL) 50 MG 24 hr tablet Take 1 tablet (50 mg total) by mouth daily.  . nicotine (NICODERM CQ - DOSED IN MG/24 HR) 7 mg/24hr patch Place 7 mg onto the skin daily.  . rivaroxaban (XARELTO) 20 MG TABS tablet TAKE 1 TABLET BY MOUTH EVERY DAY WITH SUPPER (Patient taking differently: Take 20 mg by mouth daily with supper. )  . sacubitril-valsartan (ENTRESTO) 49-51 MG Take 1 tablet by mouth 2 (two) times daily.  Review of Systems negative except from HPI and PMH  Physical Exam BP (!) 155/69   Pulse 80   Temp 98 F (36.7 C) (Oral)   Ht 6' (1.829 m)   Wt 131.5 kg   SpO2 99%   BMI 39.33 kg/m  Well developed and well nourished in no acute distress HENT normal E scleral and icterus clear Neck Supple JVP flat; carotids brisk and full Clear to ausculation Regular rate and rhythm, no murmurs gallops or rub Soft with active bowel sounds No clubbing cyanosis  Edema Alert and oriented, grossly normal motor and sensory function Skin Warm and Dry    Assessment and  Plan Atrial flutter  Cardiomyopathy   Morbid obesity--s/p Lap Band  Hypertension   DVT- chronic anticoagulation on Rivaroxaban   Smoking NONE x 10 weeks  Sleep disordered breathing    For ablation Reviewed risks and benefits Will resume anticoagulation today   BP is better controlled on entresto

## 2020-03-11 NOTE — Transfer of Care (Signed)
Immediate Anesthesia Transfer of Care Note  Patient: Matthew Savage  Procedure(s) Performed: A-FLUTTER ABLATION (N/A )  Patient Location: Cath Lab  Anesthesia Type:General  Level of Consciousness: awake, alert , oriented and patient cooperative  Airway & Oxygen Therapy: Patient Spontanous Breathing and Patient connected to nasal cannula oxygen  Post-op Assessment: Report given to RN, Post -op Vital signs reviewed and stable and Patient moving all extremities  Post vital signs: Reviewed and stable  Last Vitals:  Vitals Value Taken Time  BP 159/81 03/11/20 0947  Temp 36.5 C 03/11/20 0947  Pulse 75 03/11/20 0949  Resp 15 03/11/20 0949  SpO2 98 % 03/11/20 0949  Vitals shown include unvalidated device data.  Last Pain:  Vitals:   03/11/20 0947  TempSrc: Temporal  PainSc: 0-No pain         Complications: No complications documented.

## 2020-03-11 NOTE — Anesthesia Procedure Notes (Signed)
Procedure Name: Intubation Date/Time: 03/11/2020 7:43 AM Performed by: Myna Bright, CRNA Pre-anesthesia Checklist: Patient identified, Emergency Drugs available, Suction available and Patient being monitored Patient Re-evaluated:Patient Re-evaluated prior to induction Oxygen Delivery Method: Circle system utilized Preoxygenation: Pre-oxygenation with 100% oxygen Induction Type: IV induction Ventilation: Mask ventilation without difficulty Laryngoscope Size: Mac and 4 Grade View: Grade II Tube type: Oral Tube size: 7.5 mm Number of attempts: 1 Airway Equipment and Method: Stylet Placement Confirmation: ETT inserted through vocal cords under direct vision,  positive ETCO2 and breath sounds checked- equal and bilateral Secured at: 22 cm Tube secured with: Tape Dental Injury: Teeth and Oropharynx as per pre-operative assessment

## 2020-03-11 NOTE — Anesthesia Preprocedure Evaluation (Addendum)
Anesthesia Evaluation  Patient identified by MRN, date of birth, ID band Patient awake    Reviewed: Allergy & Precautions, NPO status , Patient's Chart, lab work & pertinent test results, reviewed documented beta blocker date and time   Airway Mallampati: II  TM Distance: >3 FB Neck ROM: Full    Dental  (+) Teeth Intact   Pulmonary sleep apnea , COPD, Current Smoker and Patient abstained from smoking.,    Pulmonary exam normal breath sounds clear to auscultation       Cardiovascular hypertension, Pt. on home beta blockers + DVT  + dysrhythmias Atrial Fibrillation  Rhythm:Irregular Rate:Abnormal     Neuro/Psych negative neurological ROS  negative psych ROS   GI/Hepatic negative GI ROS, Neg liver ROS,   Endo/Other  Morbid obesity  Renal/GU negative Renal ROS     Musculoskeletal  (+) Arthritis ,   Abdominal   Peds  Hematology  (+) Blood dyscrasia (Xarelto), anemia ,   Anesthesia Other Findings Day of surgery medications reviewed with the patient.  Reproductive/Obstetrics                            Anesthesia Physical Anesthesia Plan  ASA: III  Anesthesia Plan: General   Post-op Pain Management:    Induction: Intravenous  PONV Risk Score and Plan: 1 and Midazolam, Dexamethasone and Ondansetron  Airway Management Planned: Oral ETT  Additional Equipment:   Intra-op Plan:   Post-operative Plan: Extubation in OR  Informed Consent: I have reviewed the patients History and Physical, chart, labs and discussed the procedure including the risks, benefits and alternatives for the proposed anesthesia with the patient or authorized representative who has indicated his/her understanding and acceptance.       Plan Discussed with: CRNA  Anesthesia Plan Comments:         Anesthesia Quick Evaluation

## 2020-03-12 ENCOUNTER — Encounter (HOSPITAL_COMMUNITY): Payer: Self-pay | Admitting: Internal Medicine

## 2020-03-13 ENCOUNTER — Encounter (HOSPITAL_COMMUNITY): Payer: Self-pay | Admitting: Internal Medicine

## 2020-03-20 NOTE — Addendum Note (Signed)
Addended by: Darrell Jewel on: 03/20/2020 07:57 AM   Modules accepted: Orders

## 2020-04-03 ENCOUNTER — Other Ambulatory Visit: Payer: Self-pay | Admitting: Adult Health

## 2020-04-03 DIAGNOSIS — I825Z2 Chronic embolism and thrombosis of unspecified deep veins of left distal lower extremity: Secondary | ICD-10-CM

## 2020-04-03 DIAGNOSIS — I10 Essential (primary) hypertension: Secondary | ICD-10-CM

## 2020-04-08 ENCOUNTER — Telehealth: Payer: Self-pay | Admitting: Pulmonary Disease

## 2020-04-08 NOTE — Telephone Encounter (Signed)
Called and spoke with pt who stated he has a f/u scheduled in January 2022 which is supposed to be a follow up after CPAP start. Pt said that he has not received his machine yet. Stated to pt that machines are on backorder due to the pandemic. Stated to him that we will cancel his appt and for him to call us once he receives his machine so we can then reschedule his appt. Pt verbalized understanding. Nothing further needed.

## 2020-04-09 ENCOUNTER — Ambulatory Visit (INDEPENDENT_AMBULATORY_CARE_PROVIDER_SITE_OTHER): Payer: BC Managed Care – PPO | Admitting: Student

## 2020-04-09 ENCOUNTER — Encounter: Payer: Self-pay | Admitting: Student

## 2020-04-09 ENCOUNTER — Other Ambulatory Visit: Payer: Self-pay

## 2020-04-09 VITALS — BP 124/72 | HR 66 | Ht 72.0 in | Wt 287.4 lb

## 2020-04-09 DIAGNOSIS — I483 Typical atrial flutter: Secondary | ICD-10-CM | POA: Diagnosis not present

## 2020-04-09 DIAGNOSIS — I5020 Unspecified systolic (congestive) heart failure: Secondary | ICD-10-CM | POA: Diagnosis not present

## 2020-04-09 DIAGNOSIS — F17200 Nicotine dependence, unspecified, uncomplicated: Secondary | ICD-10-CM | POA: Diagnosis not present

## 2020-04-09 DIAGNOSIS — I10 Essential (primary) hypertension: Secondary | ICD-10-CM | POA: Diagnosis not present

## 2020-04-09 LAB — BASIC METABOLIC PANEL
BUN/Creatinine Ratio: 21 — ABNORMAL HIGH (ref 9–20)
BUN: 24 mg/dL (ref 6–24)
CO2: 23 mmol/L (ref 20–29)
Calcium: 9.8 mg/dL (ref 8.7–10.2)
Chloride: 102 mmol/L (ref 96–106)
Creatinine, Ser: 1.16 mg/dL (ref 0.76–1.27)
GFR calc Af Amer: 79 mL/min/{1.73_m2} (ref >59)
GFR calc non Af Amer: 69 mL/min/{1.73_m2} (ref >59)
Glucose: 87 mg/dL (ref 65–99)
Potassium: 4.6 mmol/L (ref 3.5–5.2)
Sodium: 138 mmol/L (ref 134–144)

## 2020-04-09 MED ORDER — PANTOPRAZOLE SODIUM 40 MG PO TBEC: 40.0000 mg | DELAYED_RELEASE_TABLET | Freq: Every day | ORAL | 0 refills | Status: DC

## 2020-04-09 NOTE — Progress Notes (Signed)
PCP:  Dorothyann Peng, NP Primary Cardiologist: Werner Lean, MD Electrophysiologist: Virl Axe, MD   Matthew Savage is a 59 y.o. male seen today for Virl Axe, MD for post AFL ablation follow up.  Since discharge from hospital the patient reports doing well overall.  he denies chest pain, palpitations, dyspnea, PND, orthopnea, nausea, vomiting, dizziness, syncope, edema, weight gain, or early satiety. He has had bad indigestion heart burn/especially over the past week. Otherwise he is doing well.    DATE TEST EF   9/18 Echo 55-65% MeanGrad 13  8/21 Echo 55-60%   9/21 Echo 45-50% AStenosis-mild meanGrad 13        AFL ablation 03/11/2020 demonstrated: #1-normal sinus node function #2 abnormal atrial function manifested by sustained atrial flutter. Catheter ablation successfully eliminated the substrate for isthmus dependent flutter #3-normal AV nodal function #4-normal His-Purkinje system function #5-no evidence of accessory pathway #6-normal ventricular function as described above.   Past Medical History:  Diagnosis Date  . Blood in urine   . DDD (degenerative disc disease)   . DVT (deep venous thrombosis) ([place]) 2017 & 2018  . Gout   . Heart murmur   . Hyperlipidemia   . Hypertension   . Numbness and tingling    right arm and hand  . SCC (squamous cell carcinoma)    skin, squamous cell, face   Past Surgical History:  Procedure Laterality Date  . A-FLUTTER ABLATION N/A 03/11/2020   Procedure: A-FLUTTER ABLATION;  Surgeon: Deboraha Sprang, MD;  Location: [facility];  Service: Cardiovascular;  Laterality: N/A;  . HERNIA REPAIR  1967   X 2  . LAPAROSCOPIC GASTRIC BANDING  03/2009   in [place], [place]  . MASS EXCISION  12/03/2011   X 2 (gluteal) X 1 (lower back)  . MOHS SURGERY     x 2    Current Outpatient Medications  Medication Sig Dispense Refill  . acetaminophen (TYLENOL) 500 MG tablet Take 1,500 mg by mouth at bedtime as needed for  mild pain or moderate pain.     Marland Kitchen allopurinol (ZYLOPRIM) 300 MG tablet Take 1 tablet (300 mg total) by mouth daily. 90 tablet 3  . amoxicillin (AMOXIL) 500 MG capsule Take 500 mg by mouth 3 (three) times daily.    Marland Kitchen atorvastatin (LIPITOR) 40 MG tablet Take 1 tablet (40 mg total) by mouth at bedtime. 90 tablet 1  . cetirizine (ZYRTEC) 10 MG tablet Take 10 mg by mouth daily.    . colchicine 0.6 MG tablet Take 0.6 mg by mouth daily as needed (Flair up gout).     . Homeopathic Products (LEG CRAMPS PO) Take 2 tablets by mouth at bedtime as needed (leg cramps).     . metoprolol succinate (TOPROL-XL) 50 MG 24 hr tablet Take 1 tablet (50 mg total) by mouth daily. 90 tablet 0  . rivaroxaban (XARELTO) 20 MG TABS tablet Take 1 tablet (20 mg total) by mouth daily with supper. 90 tablet 3  . sacubitril-valsartan (ENTRESTO) 49-51 MG Take 1 tablet by mouth 2 (two) times daily. 60 tablet 11   No current facility-administered medications for this visit.    Allergies  Allergen Reactions  . Levaquin [Levofloxacin]     Aortic Aneurysm    Social History   Socioeconomic History  . Marital status: Single    Spouse name: Not on file  . Number of children: Not on file  . Years of education: Not on file  . Highest education  level: Not on file  Occupational History  . Not on file  Tobacco Use  . Smoking status: Current Every Day Smoker    Packs/day: 2.00    Years: 36.00    Pack years: 72.00    Types: Cigarettes  . Smokeless tobacco: Never Used  Substance and Sexual Activity  . Alcohol use: Yes    Alcohol/week: 12.0 standard drinks    Types: 12 Standard drinks or equivalent per week    Comment: 2 per day  . Drug use: No  . Sexual activity: Not on file  Other Topics Concern  . 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    Married    No kids   Social Determinants of Radio broadcast assistant Strain: Not on file  Food Insecurity: Not on file   Transportation Needs: Not on file  Physical Activity: Not on file  Stress: Not on file  Social Connections: Not on file  Intimate Partner Violence: Not on file     Review of Systems: General: No chills, fever, night sweats or weight changes  Cardiovascular:  No chest pain, dyspnea on exertion, edema, orthopnea, palpitations, paroxysmal nocturnal dyspnea Dermatological: No rash, lesions or masses Respiratory: No cough, dyspnea Urologic: No hematuria, dysuria Abdominal: No nausea, vomiting, diarrhea, bright red blood per rectum, melena, or hematemesis Neurologic: No visual changes, weakness, changes in mental status All other systems reviewed and are otherwise negative except as noted above.  Physical Exam: Vitals:   04/09/20 0910  BP: 124/72  Pulse: 66  SpO2: 97%  Weight: 287 lb 6.4 oz (130.4 kg)  Height: 6' (1.829 m)    GEN- The patient is well appearing, alert and oriented x 3 today.   HEENT: normocephalic, atraumatic; sclera clear, conjunctiva pink; hearing intact; oropharynx clear; neck supple, no JVP Lymph- no cervical lymphadenopathy Lungs- Clear to ausculation bilaterally, normal work of breathing.  No wheezes, rales, rhonchi Heart- Regular rate and rhythm, no murmurs, rubs or gallops, PMI not laterally displaced GI- soft, non-tender, non-distended, bowel sounds present, no hepatosplenomegaly Extremities- no clubbing, cyanosis, or edema; DP/PT/radial pulses 2+ bilaterally MS- no significant deformity or atrophy Skin- warm and dry, no rash or lesion Psych- euthymic mood, full affect Neuro- strength and sensation are intact  EKG is ordered. Personal review of EKG from today shows NSR at 66 bpm  Additional studies reviewed include: Dr. Aquilla Hacker notes. Procedure notes for AFL ablation.  Assessment and Plan:  Atrial flutter EKG today shows NSR Continue Xarelto for CHA2DS2VASC of at least 4.   Will give protonix 40 mg daily x 30 days for likely post ablation related  acid reflux  Cardiomyopathy  Echo 12/2019 LVEF 45-50% Continue Toprol and Entresto.   Morbid obesity--s/p Lap Band Body mass index is 38.98 kg/m.   Hypertension  Better on Entresto  DVT- chronicanticoagulationonRivaroxaban Stable  Smoking NONE x 10 weeks  Sleep disordered breathing  Awaiting CPAP titration  RTC to see Dr. Caryl Comes 6 months. Sooner with symptoms.   Shirley Friar, PA-C  04/09/20 9:13 AM

## 2020-04-09 NOTE — Patient Instructions (Signed)
Medication Instructions:  Your physician has recommended you make the following change in your medication:  -- TAKE Pantoprazole (Protonix) 40 mg - Take 1 tablet (40 mg) by mouth daily for 30 days -- RX SENT *If you need a refill on your cardiac medications before your next appointment, please call your pharmacy*  Lab Work: Your physician has recommended that you have lab work today: BMET If you have labs (blood work) drawn today and your tests are completely normal, you will receive your results only by: Marland Kitchen MyChart Message (if you have MyChart) OR . A paper copy in the mail If you have any lab test that is abnormal or we need to change your treatment, we will call you to review the results.  Follow-Up: At [facility], you and your health needs are our priority.  As part of our continuing mission to provide you with exceptional heart care, we have created designated Provider Care Teams.  These Care Teams include your primary Cardiologist (physician) and Advanced Practice Providers (APPs -  Physician Assistants and Nurse Practitioners) who all work together to provide you with the care you need, when you need it.  We recommend signing up for the patient portal called "MyChart".  Sign up information is provided on this After Visit Summary.  MyChart is used to connect with patients for Virtual Visits (Telemedicine).  Patients are able to view lab/test results, encounter notes, upcoming appointments, etc.  Non-urgent messages can be sent to your provider as well.   To learn more about what you can do with MyChart, go to [email or web address].    Your next appointment:   Your physician recommends that you schedule a follow-up appointment in: 6 MONTHS with Dr. Caryl Comes.  The format for your next appointment:   In Person with Virl Axe, MD  -- Call the office and let the refill department know when you are ready for your next refill of Entresto --

## 2020-04-12 ENCOUNTER — Other Ambulatory Visit: Payer: Self-pay | Admitting: Adult Health

## 2020-04-12 ENCOUNTER — Encounter: Payer: Self-pay | Admitting: Adult Health

## 2020-04-12 ENCOUNTER — Other Ambulatory Visit: Payer: Self-pay | Admitting: Student

## 2020-04-12 DIAGNOSIS — I4891 Unspecified atrial fibrillation: Secondary | ICD-10-CM

## 2020-04-12 DIAGNOSIS — I10 Essential (primary) hypertension: Secondary | ICD-10-CM

## 2020-04-15 MED ORDER — METOPROLOL SUCCINATE ER 50 MG PO TB24: 50.0000 mg | ORAL_TABLET | Freq: Every day | ORAL | 0 refills | Status: DC

## 2020-04-25 ENCOUNTER — Ambulatory Visit: Payer: BC Managed Care – PPO | Admitting: Pulmonary Disease

## 2020-04-25 ENCOUNTER — Other Ambulatory Visit: Payer: Self-pay | Admitting: Student

## 2020-05-17 ENCOUNTER — Encounter: Payer: Self-pay | Admitting: Adult Health

## 2020-05-20 ENCOUNTER — Telehealth: Payer: Self-pay | Admitting: Internal Medicine

## 2020-05-20 NOTE — Telephone Encounter (Signed)
Matthew Savage is calling due to having to cancel his appointment with Dr. Caryl Comes due to going out of town for work that day. He states he wishes to reschedule, but is not available on Tuesday's.  He states he can do most Thursday's due to being off every other one, but the PA's scheduled are not out that far and Dr. Olin Pia schedule only has Tuesday's available at this time. Please advise.

## 2020-06-01 ENCOUNTER — Other Ambulatory Visit: Payer: Self-pay | Admitting: Adult Health

## 2020-06-04 NOTE — Telephone Encounter (Signed)
LEFT A MESSAGE FOR THE PT TO CALL BACK AND SCHEDULE HIS CPX.

## 2020-06-05 ENCOUNTER — Other Ambulatory Visit: Payer: Self-pay

## 2020-06-05 ENCOUNTER — Ambulatory Visit (HOSPITAL_COMMUNITY): Payer: BC Managed Care – PPO | Attending: Cardiology

## 2020-06-05 DIAGNOSIS — I712 Thoracic aortic aneurysm, without rupture, unspecified: Secondary | ICD-10-CM

## 2020-06-05 DIAGNOSIS — I350 Nonrheumatic aortic (valve) stenosis: Secondary | ICD-10-CM | POA: Insufficient documentation

## 2020-06-05 LAB — ECHOCARDIOGRAM COMPLETE
AR max vel: 1.52 cm2
AV Area VTI: 1.59 cm2
AV Area mean vel: 1.62 cm2
AV Mean grad: 17.5 mmHg
AV Peak grad: 32 mmHg
Ao pk vel: 2.83 m/s
Area-P 1/2: 2.95 cm2
S' Lateral: 4.2 cm

## 2020-06-05 NOTE — Telephone Encounter (Signed)
SENT TO THE PHARMACY BY E-SCRIBE.  PT HAS NOW BEEN SCHEDULED FOR APPT.

## 2020-06-06 ENCOUNTER — Ambulatory Visit: Payer: BC Managed Care – PPO | Admitting: Internal Medicine

## 2020-06-14 ENCOUNTER — Encounter: Payer: Self-pay | Admitting: Adult Health

## 2020-06-19 ENCOUNTER — Other Ambulatory Visit (HOSPITAL_COMMUNITY): Payer: BC Managed Care – PPO

## 2020-06-20 ENCOUNTER — Other Ambulatory Visit (HOSPITAL_COMMUNITY): Payer: BC Managed Care – PPO

## 2020-07-03 ENCOUNTER — Other Ambulatory Visit (HOSPITAL_COMMUNITY): Payer: BC Managed Care – PPO

## 2020-07-04 ENCOUNTER — Other Ambulatory Visit: Payer: Self-pay | Admitting: Adult Health

## 2020-07-04 DIAGNOSIS — M109 Gout, unspecified: Secondary | ICD-10-CM

## 2020-07-08 ENCOUNTER — Other Ambulatory Visit: Payer: Self-pay

## 2020-07-08 ENCOUNTER — Ambulatory Visit (INDEPENDENT_AMBULATORY_CARE_PROVIDER_SITE_OTHER): Payer: BC Managed Care – PPO | Admitting: Internal Medicine

## 2020-07-08 ENCOUNTER — Encounter: Payer: Self-pay | Admitting: Internal Medicine

## 2020-07-08 VITALS — BP 96/60 | HR 62 | Ht 72.0 in | Wt 284.4 lb

## 2020-07-08 DIAGNOSIS — I5020 Unspecified systolic (congestive) heart failure: Secondary | ICD-10-CM | POA: Diagnosis not present

## 2020-07-08 DIAGNOSIS — I712 Thoracic aortic aneurysm, without rupture, unspecified: Secondary | ICD-10-CM

## 2020-07-08 DIAGNOSIS — I483 Typical atrial flutter: Secondary | ICD-10-CM

## 2020-07-08 DIAGNOSIS — I10 Essential (primary) hypertension: Secondary | ICD-10-CM

## 2020-07-08 DIAGNOSIS — E785 Hyperlipidemia, unspecified: Secondary | ICD-10-CM | POA: Diagnosis not present

## 2020-07-08 DIAGNOSIS — I359 Nonrheumatic aortic valve disorder, unspecified: Secondary | ICD-10-CM

## 2020-07-08 DIAGNOSIS — G4733 Obstructive sleep apnea (adult) (pediatric): Secondary | ICD-10-CM

## 2020-07-08 LAB — HEPATIC FUNCTION PANEL
ALT: 11 IU/L (ref 0–44)
AST: 9 IU/L (ref 0–40)
Albumin: 3.9 g/dL (ref 3.8–4.9)
Alkaline Phosphatase: 95 IU/L (ref 44–121)
Bilirubin Total: 0.5 mg/dL (ref 0.0–1.2)
Bilirubin, Direct: 0.18 mg/dL (ref 0.00–0.40)
Total Protein: 6.5 g/dL (ref 6.0–8.5)

## 2020-07-08 LAB — LIPID PANEL
Chol/HDL Ratio: 2.3 ratio (ref 0.0–5.0)
Cholesterol, Total: 114 mg/dL (ref 100–199)
HDL: 50 mg/dL (ref >39)
LDL Chol Calc (NIH): 49 mg/dL (ref 0–99)
Triglycerides: 74 mg/dL (ref 0–149)
VLDL Cholesterol Cal: 15 mg/dL (ref 5–40)

## 2020-07-08 NOTE — Patient Instructions (Signed)
Medication Instructions:  Your physician recommends that you continue on your current medications as directed. Please refer to the Current Medication list given to you today.  *If you need a refill on your cardiac medications before your next appointment, please call your pharmacy*   Lab Work: TODAY: Lipid panel and liver function test If you have labs (blood work) drawn today and your tests are completely normal, you will receive your results only by: Marland Kitchen MyChart Message (if you have MyChart) OR . A paper copy in the mail If you have any lab test that is abnormal or we need to change your treatment, we will call you to review the results.   Testing/Procedures: Your physician has requested that you have an echocardiogram in 11 months. Echocardiography is a painless test that uses sound waves to create images of your heart. It provides your doctor with information about the size and shape of your heart and how well your heart's chambers and valves are working. This procedure takes approximately one hour. There are no restrictions for this procedure.     Follow-Up: At [facility], you and your health needs are our priority.  As part of our continuing mission to provide you with exceptional heart care, we have created designated Provider Care Teams.  These Care Teams include your primary Cardiologist (physician) and Advanced Practice Providers (APPs -  Physician Assistants and Nurse Practitioners) who all work together to provide you with the care you need, when you need it.   Your next appointment:   12 month(s)  The format for your next appointment:   In Person  Provider:   You may see Werner Lean, MD or one of the following Advanced Practice Providers on your designated Care Team:    Melina Copa, PA-C  Ermalinda Barrios, PA-C    Other Instructions Reschedule appointment with EP

## 2020-07-08 NOTE — Progress Notes (Signed)
Cardiology Office Note:    Date:  07/08/2020   ID:  Matthew Savage, DOB [birth year], MRN [id number]  PCP:  Dorothyann Peng, NP  [facility] Cardiologist:  Werner Lean, MD  [facility] Electrophysiologist:  Virl Axe, MD   Referring MD: Dorothyann Peng, NP   IO:EVOJJK up AF  History of Present Illness:    Matthew Savage is a 60 y.o. male with a hx of morbid obesity s/p Lap Band atrial fibrillation (paroxsymal) atrial flutter (first diagnoses in [place]), essential hypertension, mild aortic stenosis, hyperlipidemia, and tobacco use who established at [facility] 12/28/19.  Planned for DCCV but has palpitations and discomfort with expedited ED DCCV.Saw EP and is considering ablation.  Has had ablation schedule for 11/22.  Has had persistent high blood pressure despite starting new medication.  Has had slight decrease in LVEF, since prior imaging (EF 45%).  Had new diagnosis of TAA with dilation of 4.1 cm.  Last seen 02/29/20.  In interim of this visit, patient had AF ablation.  Patient notes that he is doing feels better with everything but his stomach .  Since last visit notes that he has a CPAP machine.  Relevant interval testing or therapy include the abalation.  There are no interval hospital/ED visit.    Notes that he has some persistent heart burn..  No SOB/DOE and no PND/Orthopnea.  Notes intentional weight loss.  No palpitations or syncope .  Patient is still smoke free.  Ambulatory blood pressure 100/60.   Past Medical History:  Diagnosis Date  . Blood in urine   . DDD (degenerative disc disease)   . DVT (deep venous thrombosis) ([place]) 2017 & 2018  . Gout   . Heart murmur   . Hyperlipidemia   . Hypertension   . Numbness and tingling    right arm and hand  . SCC (squamous cell carcinoma)    skin, squamous cell, face    Past Surgical History:  Procedure Laterality Date  . A-FLUTTER ABLATION N/A 03/11/2020   Procedure: A-FLUTTER ABLATION;  Surgeon: Deboraha Sprang, MD;  Location: [place] [facility];  Service: Cardiovascular;  Laterality: N/A;  . HERNIA REPAIR  1967   X 2  . LAPAROSCOPIC GASTRIC BANDING  03/2009   in [place], [place]  . MASS EXCISION  12/03/2011   X 2 (gluteal) X 1 (lower back)  . MOHS SURGERY     x 2   Current Medications: Current Meds  Medication Sig  . acetaminophen (TYLENOL) 500 MG tablet Take 1,500 mg by mouth at bedtime as needed for mild pain or moderate pain.   Marland Kitchen allopurinol (ZYLOPRIM) 300 MG tablet Take 1 tablet (300 mg total) by mouth daily.  Marland Kitchen atorvastatin (LIPITOR) 40 MG tablet TAKE 1 TABLET BY MOUTH EVERYDAY AT BEDTIME  . cetirizine (ZYRTEC) 10 MG tablet Take 10 mg by mouth daily.  . colchicine 0.6 MG tablet Take 0.6 mg by mouth daily as needed (Flair up gout).   . famotidine (PEPCID) 20 MG tablet Take 20 mg by mouth 2 (two) times daily.  . Homeopathic Products (LEG CRAMPS PO) Take 2 tablets by mouth at bedtime as needed (leg cramps).   . metoprolol succinate (TOPROL-XL) 50 MG 24 hr tablet Take 1 tablet (50 mg total) by mouth daily. Take with or immediately following a meal.  . omeprazole (PRILOSEC) 20 MG capsule Take 20 mg by mouth daily.  . rivaroxaban (XARELTO) 20 MG TABS tablet Take 1 tablet (20 mg  total) by mouth daily with supper.  . sacubitril-valsartan (ENTRESTO) 49-51 MG Take 1 tablet by mouth 2 (two) times daily.     Allergies:   Levaquin [levofloxacin]   Social History   Socioeconomic History  . Marital status: Single    Spouse name: Not on file  . Number of children: Not on file  . Years of education: Not on file  . Highest education level: Not on file  Occupational History  . Not on file  Tobacco Use  . Smoking status: Current Every Day Smoker    Packs/day: 2.00    Years: 36.00    Pack years: 72.00    Types: Cigarettes  . Smokeless tobacco: Never Used  Substance and Sexual Activity  . Alcohol use: Yes    Alcohol/week: 12.0 standard drinks    Types: 12 Standard drinks or equivalent per  week    Comment: 2 per day  . Drug use: No  . Sexual activity: Not on file  Other Topics Concern  . 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    Married    No kids   Social Determinants of Radio broadcast assistant Strain: Not on file  Food Insecurity: Not on file  Transportation Needs: Not on file  Physical Activity: Not on file  Stress: Not on file  Social Connections: Not on file    Family History: The patient's family history includes Breast cancer in his mother; Colon polyps in his brother and father; Hypertension in his father; Other in his mother. Cousin has PE and died.  No hx of Bicuspid Valve in Family.  No hx of SCD.   ROS:   Please see the history of present illness.    All other systems reviewed and are negative.  EKGs/Labs/Other Studies Reviewed:    The following studies were reviewed today:  EKG:   02/29/20 atrial flutter with ventricular rate of 94 and Ashman beats vs occasional PVCs 12/22/19 EKG notable for atrial flutter rate 96  Transthoracic Echocardiogram: Date:07/08/20 Results: 1. Left ventricular ejection fraction, by estimation, is 50 to 55%. The  left ventricle has low normal function. The left ventricle demonstrates  regional wall motion abnormalities (see scoring diagram/findings for  description). There is moderate  concentric left ventricular hypertrophy. Left ventricular diastolic  parameters are consistent with Grade II diastolic dysfunction  (pseudonormalization). Elevated left ventricular end-diastolic pressure.  There is hypokinesis of the left ventricular,  entire anterior wall and lateral wall.  2. Right ventricular systolic function is normal. The right ventricular  size is normal. Tricuspid regurgitation signal is inadequate for assessing  PA pressure.  3. Left atrial size was mild to moderately dilated.  4. Right atrial size was mildly dilated.  5. The mitral valve is normal in  structure. Trivial mitral valve  regurgitation. No evidence of mitral stenosis.  6. The aortic valve is tricuspid. There is moderate calcification of the  aortic valve. There is moderate thickening of the aortic valve. Aortic  valve regurgitation is not visualized. Mild aortic valve stenosis.  7. Aortic dilatation noted. There is mild dilatation of the ascending  aorta, measuring 41 mm.  8. The inferior vena cava is normal in size with greater than 50%  respiratory variability, suggesting right atrial pressure of 3 mmHg.   Recent Labs: 12/22/2019: ALT 35 01/22/2020: Magnesium 1.5 03/08/2020: Hemoglobin 12.5; Platelets 286 04/09/2020: BUN 24; Creatinine, Ser 1.16; Potassium 4.6; Sodium 138  Recent  Lipid Panel    Component Value Date/Time   CHOL 169 06/21/2019 0729   TRIG 85.0 06/21/2019 0729   HDL 44.70 06/21/2019 0729   CHOLHDL 4 06/21/2019 0729   VLDL 17.0 06/21/2019 0729   LDLCALC 107 (H) 06/21/2019 0729   LDLDIRECT 135.1 04/06/2011 0835    Physical Exam:    VS:  BP 96/60   Pulse 62   Ht 6' (1.829 m)   Wt 284 lb 6.4 oz (129 kg)   SpO2 98%   BMI 38.57 kg/m     Wt Readings from Last 3 Encounters:  07/08/20 284 lb 6.4 oz (129 kg)  04/09/20 287 lb 6.4 oz (130.4 kg)  03/11/20 290 lb (131.5 kg)    GEN: Obese, no acute distress HEENT: Unilateral (left) Frank's Sign, otherwise normal  NECK: No JVD LYMPHATICS: No lymphadenopathy CARDIAC: RRR, no murmurs, rubs, gallops; soft heart sounds, +2 radial RESPIRATORY:  Good air movement no expiratory wheezes ABDOMEN: Soft, non-tender, non-distended MUSCULOSKELETAL:  No edema; No deformity  SKIN: Warm and dry NEUROLOGIC:  Alert and oriented x 3 PSYCHIATRIC:  Normal affect   ASSESSMENT:    1. Thoracic aortic aneurysm without rupture ([place])   2. Hyperlipidemia, unspecified hyperlipidemia type   3. HFrEF (heart failure with reduced ejection fraction) ([place])   4. Morbid obesity ([place])   5. Essential hypertension   6. Aortic  valve disorder   7. OSA (obstructive sleep apnea)   8. Typical atrial flutter (HCC)    PLAN:    In order of problems listed above:  Asymptomatic thoracic aortic aneurysm HLD - Last at 4.1, stable growth rate - 1st degree relative one time screening discussed at prior visits - Discussed not using Fluoroquinolones at prior visits - Continue statin: check Lipids and LFTs today - will get echo in 11 months to follow aneurysm  Heart Failure mildly reduced Ejection Fraction  Morbid Obesity HTN - NYHA class I, Stage B, euvolemic, etiology from AF - Diuretic regimen: none - Discussed the importance of fluid restriction of < 2 L, salt restriction, and checking daily weights  - metoprolol succinate 50 mg - Entresto 49-51 - EF has recovered  Mild AS - Last Aortic Gradients  17 mm Hg in 2022; follow up in one year (echo for other reasons)  Atrial Flutter, Atrial Fibrillation  - CHADSVASC 2 (HTN/DM) Hx of DVT on Xarelto - Asymptomatic s/p ablation - s/p DCCV - continue AC for DVT and A fib;  continue BB  OSA - on CPAP and sleeping btter  Former Tobacco Use - 6 months free  11 months follow up unless new symptoms or abnormal test results warranting change in plan  Would be reasonable for  Video Visit Follow up Would be reasonable for  APP Follow up  Medication Adjustments/Labs and Tests Ordered: Current medicines are reviewed at length with the patient today.  Concerns regarding medicines are outlined above.  Orders Placed This Encounter  Procedures  . Lipid panel  . Hepatic function panel  . ECHOCARDIOGRAM COMPLETE   No orders of the defined types were placed in this encounter.   Patient Instructions  Medication Instructions:  Your physician recommends that you continue on your current medications as directed. Please refer to the Current Medication list given to you today.  *If you need a refill on your cardiac medications before your next appointment, please call  your pharmacy*   Lab Work: TODAY: Lipid panel and liver function test If you have labs (blood work) drawn  today and your tests are completely normal, you will receive your results only by: Marland Kitchen MyChart Message (if you have MyChart) OR . A paper copy in the mail If you have any lab test that is abnormal or we need to change your treatment, we will call you to review the results.   Testing/Procedures: Your physician has requested that you have an echocardiogram in 11 months. Echocardiography is a painless test that uses sound waves to create images of your heart. It provides your doctor with information about the size and shape of your heart and how well your heart's chambers and valves are working. This procedure takes approximately one hour. There are no restrictions for this procedure.     Follow-Up: At [facility], you and your health needs are our priority.  As part of our continuing mission to provide you with exceptional heart care, we have created designated Provider Care Teams.  These Care Teams include your primary Cardiologist (physician) and Advanced Practice Providers (APPs -  Physician Assistants and Nurse Practitioners) who all work together to provide you with the care you need, when you need it.   Your next appointment:   12 month(s)  The format for your next appointment:   In Person  Provider:   You may see Werner Lean, MD or one of the following Advanced Practice Providers on your designated Care Team:    Melina Copa, PA-C  Ermalinda Barrios, PA-C    Other Instructions Reschedule appointment with EP     Signed, Werner Lean, MD  07/08/2020 8:41 AM    [place]

## 2020-07-11 ENCOUNTER — Other Ambulatory Visit: Payer: Self-pay | Admitting: Adult Health

## 2020-07-11 DIAGNOSIS — I4891 Unspecified atrial fibrillation: Secondary | ICD-10-CM

## 2020-07-11 DIAGNOSIS — I10 Essential (primary) hypertension: Secondary | ICD-10-CM

## 2020-07-16 ENCOUNTER — Other Ambulatory Visit: Payer: Self-pay

## 2020-07-16 ENCOUNTER — Encounter: Payer: Self-pay | Admitting: Adult Health

## 2020-07-17 ENCOUNTER — Ambulatory Visit: Payer: BC Managed Care – PPO | Admitting: Physician Assistant

## 2020-07-17 ENCOUNTER — Ambulatory Visit: Payer: BC Managed Care – PPO | Admitting: Adult Health

## 2020-07-17 VITALS — BP 114/60 | HR 75 | Temp 98.3°F | Resp 18 | Ht 72.0 in | Wt 288.4 lb

## 2020-07-17 DIAGNOSIS — R112 Nausea with vomiting, unspecified: Secondary | ICD-10-CM | POA: Diagnosis not present

## 2020-07-17 DIAGNOSIS — K2100 Gastro-esophageal reflux disease with esophagitis, without bleeding: Secondary | ICD-10-CM | POA: Diagnosis not present

## 2020-07-17 MED ORDER — PANTOPRAZOLE SODIUM 40 MG PO TBEC: 40.0000 mg | DELAYED_RELEASE_TABLET | Freq: Every day | ORAL | 0 refills | Status: DC

## 2020-07-17 MED ORDER — ONDANSETRON HCL 4 MG PO TABS: 4.0000 mg | ORAL_TABLET | Freq: Three times a day (TID) | ORAL | 0 refills | Status: DC | PRN

## 2020-07-17 NOTE — Progress Notes (Signed)
Subjective:    Patient ID: Matthew Savage, male    DOB: 1960/09/19, 60 y.o.   MRN: [id number]  HPI  60 year old male who  has a past medical history of Blood in urine, DDD (degenerative disc disease), DVT (deep venous thrombosis) ([place]) (2017 & 2018), Gout, Heart murmur, Hyperlipidemia, Hypertension, Numbness and tingling, and SCC (squamous cell carcinoma).  He is being seen today for worsening acid reflux.  Has been ongoing over the last couple months, feels as though his symptoms are getting worse.  Currently prescribed Prilosec 20 mg daily and Pepcid 20 mg twice daily.  Reports excessive belching, heartburn, and now vomiting with nausea sometimes after he eats.  He has been unable to keep a lot of solid food down but has noticed that he is able to drink protein shakes and smoothies without difficulty.  Reports that his symptoms are similar to that of when he had to have a LAP-BAND adjustment but is unable to get into see his bariatric surgeon until end of April.  Denies diarrhea, constipation, or fevers  In the office today he is in no discomfort  Review of Systems See HPI   Past Medical History:  Diagnosis Date  . Blood in urine   . DDD (degenerative disc disease)   . DVT (deep venous thrombosis) ([place]) 2017 & 2018  . Gout   . Heart murmur   . Hyperlipidemia   . Hypertension   . Numbness and tingling    right arm and hand  . SCC (squamous cell carcinoma)    skin, squamous cell, face    Social History   Socioeconomic History  . Marital status: Single    Spouse name: Not on file  . Number of children: Not on file  . Years of education: Not on file  . Highest education level: Not on file  Occupational History  . Not on file  Tobacco Use  . Smoking status: Current Every Day Smoker    Packs/day: 2.00    Years: 36.00    Pack years: 72.00    Types: Cigarettes  . Smokeless tobacco: Never Used  Substance and Sexual Activity  . Alcohol use: Yes    Alcohol/week: 12.0  standard drinks    Types: 12 Standard drinks or equivalent per week    Comment: 2 per day  . Drug use: No  . Sexual activity: Not on file  Other Topics Concern  . 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    Married    No kids   Social Determinants of Radio broadcast assistant Strain: Not on file  Food Insecurity: Not on file  Transportation Needs: Not on file  Physical Activity: Not on file  Stress: Not on file  Social Connections: Not on file  Intimate Partner Violence: Not on file    Past Surgical History:  Procedure Laterality Date  . A-FLUTTER ABLATION N/A 03/11/2020   Procedure: A-FLUTTER ABLATION;  Surgeon: Deboraha Sprang, MD;  Location: [place] [facility];  Service: Cardiovascular;  Laterality: N/A;  . HERNIA REPAIR  1967   X 2  . LAPAROSCOPIC GASTRIC BANDING  03/2009   in [place], [place]  . MASS EXCISION  12/03/2011   X 2 (gluteal) X 1 (lower back)  . MOHS SURGERY     x 2    Family History  Problem Relation Age of Onset  . Breast cancer Mother   .  Other Mother        small cell carcinoma  . Hypertension Father   . Colon polyps Father   . Colon polyps Brother     Allergies  Allergen Reactions  . Levaquin [Levofloxacin]     Aortic Aneurysm    Current Outpatient Medications on File Prior to Visit  Medication Sig Dispense Refill  . acetaminophen (TYLENOL) 500 MG tablet Take 1,500 mg by mouth at bedtime as needed for mild pain or moderate pain.     Marland Kitchen allopurinol (ZYLOPRIM) 300 MG tablet TAKE 1 TABLET BY MOUTH EVERY DAY 90 tablet 0  . atorvastatin (LIPITOR) 40 MG tablet TAKE 1 TABLET BY MOUTH EVERYDAY AT BEDTIME 90 tablet 0  . cetirizine (ZYRTEC) 10 MG tablet Take 10 mg by mouth daily.    . colchicine 0.6 MG tablet Take 0.6 mg by mouth daily as needed (Flair up gout).     . famotidine (PEPCID) 20 MG tablet Take 20 mg by mouth 2 (two) times daily.    . Homeopathic Products (LEG CRAMPS PO) Take 2 tablets by mouth at  bedtime as needed (leg cramps).     . metoprolol succinate (TOPROL-XL) 50 MG 24 hr tablet TAKE 1 TABLET BY MOUTH EVERY DAY 90 tablet 0  . omeprazole (PRILOSEC) 20 MG capsule Take 20 mg by mouth daily.    . rivaroxaban (XARELTO) 20 MG TABS tablet Take 1 tablet (20 mg total) by mouth daily with supper. 90 tablet 3  . sacubitril-valsartan (ENTRESTO) 49-51 MG Take 1 tablet by mouth 2 (two) times daily. 60 tablet 11   No current facility-administered medications on file prior to visit.    BP 114/60 (BP Location: Left Arm, Patient Position: Sitting, Cuff Size: Normal)   Pulse 75   Temp 98.3 F (36.8 C) (Oral)   Resp 18   Ht 6' (1.829 m)   Wt 288 lb 6.4 oz (130.8 kg)   SpO2 99%   BMI 39.11 kg/m       Objective:   Physical Exam Vitals and nursing note reviewed.  Constitutional:      Appearance: Normal appearance. He is obese.  Cardiovascular:     Rate and Rhythm: Normal rate and regular rhythm.     Heart sounds: Normal heart sounds.  Pulmonary:     Effort: Pulmonary effort is normal.     Breath sounds: Normal breath sounds.  Abdominal:     General: Abdomen is flat. Bowel sounds are normal. There is no distension.     Palpations: Abdomen is soft.     Tenderness: There is no abdominal tenderness. There is no guarding or rebound.  Musculoskeletal:        General: Normal range of motion.  Skin:    General: Skin is warm and dry.  Neurological:     Mental Status: He is alert and oriented to person, place, and time.  Psychiatric:        Mood and Affect: Mood normal.        Behavior: Behavior normal.        Thought Content: Thought content normal.        Judgment: Judgment normal.       Assessment & Plan:  1. Gastroesophageal reflux disease with esophagitis without hemorrhage -We will switch Prilosec to Protonix.  He would like to try this before trying to get into the bariatric surgery center at [facility] surgery for further evaluation.  He will try and make it until he  is able  to be seen by [facility] at [facility]  - pantoprazole (PROTONIX) 40 MG tablet; Take 1 tablet (40 mg total) by mouth daily.  Dispense: 30 tablet; Refill: 0  2. Nausea and vomiting, intractability of vomiting not specified, unspecified vomiting type  - ondansetron (ZOFRAN) 4 MG tablet; Take 1 tablet (4 mg total) by mouth every 8 (eight) hours as needed for nausea or vomiting.  Dispense: 20 tablet; Refill: 0   Dorothyann Peng, NP

## 2020-07-24 ENCOUNTER — Other Ambulatory Visit: Payer: Self-pay | Admitting: Adult Health

## 2020-07-24 ENCOUNTER — Encounter: Payer: Self-pay | Admitting: Adult Health

## 2020-07-24 DIAGNOSIS — K2100 Gastro-esophageal reflux disease with esophagitis, without bleeding: Secondary | ICD-10-CM

## 2020-07-24 MED ORDER — SUCRALFATE 1 G PO TABS: 1.0000 g | ORAL_TABLET | Freq: Three times a day (TID) | ORAL | 0 refills | Status: DC

## 2020-07-24 MED ORDER — SUCRALFATE 1 G PO TABS: 1.0000 g | ORAL_TABLET | Freq: Three times a day (TID) | ORAL | 1 refills | Status: DC

## 2020-07-31 ENCOUNTER — Ambulatory Visit (INDEPENDENT_AMBULATORY_CARE_PROVIDER_SITE_OTHER): Payer: BC Managed Care – PPO | Admitting: Adult Health

## 2020-07-31 ENCOUNTER — Encounter: Payer: Self-pay | Admitting: Adult Health

## 2020-07-31 ENCOUNTER — Other Ambulatory Visit: Payer: Self-pay

## 2020-07-31 VITALS — BP 138/82 | HR 75 | Temp 98.2°F | Resp 16 | Ht 75.0 in | Wt 282.8 lb

## 2020-07-31 DIAGNOSIS — I483 Typical atrial flutter: Secondary | ICD-10-CM

## 2020-07-31 DIAGNOSIS — Z23 Encounter for immunization: Secondary | ICD-10-CM | POA: Diagnosis not present

## 2020-07-31 DIAGNOSIS — I10 Essential (primary) hypertension: Secondary | ICD-10-CM

## 2020-07-31 DIAGNOSIS — F17200 Nicotine dependence, unspecified, uncomplicated: Secondary | ICD-10-CM

## 2020-07-31 DIAGNOSIS — K2100 Gastro-esophageal reflux disease with esophagitis, without bleeding: Secondary | ICD-10-CM | POA: Diagnosis not present

## 2020-07-31 DIAGNOSIS — Z125 Encounter for screening for malignant neoplasm of prostate: Secondary | ICD-10-CM | POA: Diagnosis not present

## 2020-07-31 DIAGNOSIS — Z0000 Encounter for general adult medical examination without abnormal findings: Secondary | ICD-10-CM | POA: Diagnosis not present

## 2020-07-31 DIAGNOSIS — I4891 Unspecified atrial fibrillation: Secondary | ICD-10-CM

## 2020-07-31 DIAGNOSIS — G4733 Obstructive sleep apnea (adult) (pediatric): Secondary | ICD-10-CM

## 2020-07-31 DIAGNOSIS — Z9884 Bariatric surgery status: Secondary | ICD-10-CM

## 2020-07-31 DIAGNOSIS — E785 Hyperlipidemia, unspecified: Secondary | ICD-10-CM

## 2020-07-31 DIAGNOSIS — I825Z2 Chronic embolism and thrombosis of unspecified deep veins of left distal lower extremity: Secondary | ICD-10-CM

## 2020-07-31 DIAGNOSIS — M1A9XX0 Chronic gout, unspecified, without tophus (tophi): Secondary | ICD-10-CM

## 2020-07-31 LAB — CBC WITH DIFFERENTIAL/PLATELET
Basophils Absolute: 0 10*3/uL (ref 0.0–0.1)
Basophils Relative: 0.2 % (ref 0.0–3.0)
Eosinophils Absolute: 0.2 10*3/uL (ref 0.0–0.7)
Eosinophils Relative: 1.9 % (ref 0.0–5.0)
HCT: 34.1 % — ABNORMAL LOW (ref 39.0–52.0)
Hemoglobin: 11.3 g/dL — ABNORMAL LOW (ref 13.0–17.0)
Lymphocytes Relative: 11.6 % — ABNORMAL LOW (ref 12.0–46.0)
Lymphs Abs: 1 10*3/uL (ref 0.7–4.0)
MCHC: 33.2 g/dL (ref 30.0–36.0)
MCV: 98 fl (ref 78.0–100.0)
Monocytes Absolute: 0.7 10*3/uL (ref 0.1–1.0)
Monocytes Relative: 7.9 % (ref 3.0–12.0)
Neutro Abs: 6.7 10*3/uL (ref 1.4–7.7)
Neutrophils Relative %: 78.4 % — ABNORMAL HIGH (ref 43.0–77.0)
Platelets: 276 10*3/uL (ref 150.0–400.0)
RBC: 3.48 Mil/uL — ABNORMAL LOW (ref 4.22–5.81)
RDW: 14.4 % (ref 11.5–15.5)
WBC: 8.5 10*3/uL (ref 4.0–10.5)

## 2020-07-31 LAB — TSH: TSH: 0.91 u[IU]/mL (ref 0.35–4.50)

## 2020-07-31 LAB — PSA: PSA: 0.68 ng/mL (ref 0.10–4.00)

## 2020-07-31 NOTE — Progress Notes (Signed)
Subjective:    Patient ID: Matthew Savage, male    DOB: 08-06-1960, 60 y.o.   MRN: [id number]  HPI Patient presents for yearly preventative medicine examination. He is a pleasant 60 year old male who  has a past medical history of Blood in urine, DDD (degenerative disc disease), DVT (deep venous thrombosis) ([place]) (2017 & 2018), Gout, Heart murmur, Hyperlipidemia, Hypertension, Numbness and tingling, and SCC (squamous cell carcinoma).  Hypertension -currently prescribed Entresto 49-51 mg he denies dizziness, lightheadedness, chest pain, or shortness of breath  BP Readings from Last 3 Encounters:  07/31/20 138/82  07/17/20 114/60  07/08/20 96/60   H/o DVT -2017 and 2018.  Is currently on lifelong Xarelto therapy.  He denies bleeding, shortness of breath, or chest pain  Gout-takes allopurinol 300 mg daily and colchicine as needed for acute flares.  He has not had an acute flare in some time  Tobacco use-successfully quit smoking  A Fib/A flutter -followed by cardiology, currently on Xarelto and Toprol 50 mg extended release.  Had an ablation in November 2021  AFL ablation 03/11/2020 demonstrated: #1-normal sinus node function #2 abnormal atrial function manifested by sustained atrial flutter. Catheter ablation successfully eliminated the substrate for isthmus dependent flutter #3-normal AV nodal function #4-normal His-Purkinje system function #5-no evidence of accessory pathway #6-normal ventricular function as described above.  History of bariatric surgery-LAP-BAND in 2010 in [place].  Over the last few months he has been experiencing worsening GERD-like symptoms.  Believes he needs his lap band adjusted, has an upcoming appointment with the bariatric surgeon at [facility] towards the end of this month.He continues to have nausea and vomiting. Was placed on carafate late last week and reports some improvement in his symptoms.   OSA- uses CPAP nightly.  Denies any issues with  mask fit.  Wakes up feeling refreshed and no daytime somnolence.  Hyperlipidemia - takes lipitor 40 mg. Denies myalgia or fatigue  Lab Results  Component Value Date   CHOL 114 07/08/2020   HDL 50 07/08/2020   LDLCALC 49 07/08/2020   LDLDIRECT 135.1 04/06/2011   TRIG 74 07/08/2020   CHOLHDL 2.3 07/08/2020    All immunizations and health maintenance protocols were reviewed with the patient and needed orders were placed.  Appropriate screening laboratory values were ordered for the patient including screening of hyperlipidemia, renal function and hepatic function. If indicated by BPH, a PSA was ordered.  Medication reconciliation,  past medical history, social history, problem list and allergies were reviewed in detail with the patient  Goals were established with regard to weight loss, exercise, and  diet in compliance with medications Wt Readings from Last 3 Encounters:  07/31/20 282 lb 12.8 oz (128.3 kg)  07/17/20 288 lb 6.4 oz (130.8 kg)  07/08/20 284 lb 6.4 oz (129 kg)   Review of Systems  Constitutional: Negative.   HENT: Negative.   Eyes: Negative.   Respiratory: Negative.   Cardiovascular: Negative.   Gastrointestinal: Positive for abdominal pain, nausea and vomiting.  Endocrine: Negative.   Genitourinary: Negative.   Musculoskeletal: Negative.   Skin: Negative.   Allergic/Immunologic: Negative.   Neurological: Negative.   Hematological: Negative.   Psychiatric/Behavioral: Negative.   All other systems reviewed and are negative.  Past Medical History:  Diagnosis Date  . Blood in urine   . DDD (degenerative disc disease)   . DVT (deep venous thrombosis) ([place]) 2017 & 2018  . Gout   . Heart murmur   . Hyperlipidemia   .  Hypertension   . Numbness and tingling    right arm and hand  . SCC (squamous cell carcinoma)    skin, squamous cell, face    Social History   Socioeconomic History  . Marital status: Single    Spouse name: Not on file  . Number of  children: Not on file  . Years of education: Not on file  . Highest education level: Not on file  Occupational History  . Not on file  Tobacco Use  . Smoking status: Current Every Day Smoker    Packs/day: 2.00    Years: 36.00    Pack years: 72.00    Types: Cigarettes  . Smokeless tobacco: Never Used  Substance and Sexual Activity  . Alcohol use: Yes    Alcohol/week: 12.0 standard drinks    Types: 12 Standard drinks or equivalent per week    Comment: 2 per day  . Drug use: No  . Sexual activity: Not on file  Other Topics Concern  . 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    Married    No kids   Social Determinants of Radio broadcast assistant Strain: Not on file  Food Insecurity: Not on file  Transportation Needs: Not on file  Physical Activity: Not on file  Stress: Not on file  Social Connections: Not on file  Intimate Partner Violence: Not on file    Past Surgical History:  Procedure Laterality Date  . A-FLUTTER ABLATION N/A 03/11/2020   Procedure: A-FLUTTER ABLATION;  Surgeon: Deboraha Sprang, MD;  Location: [facility];  Service: Cardiovascular;  Laterality: N/A;  . HERNIA REPAIR  1967   X 2  . LAPAROSCOPIC GASTRIC BANDING  03/2009   in [place], [place]  . MASS EXCISION  12/03/2011   X 2 (gluteal) X 1 (lower back)  . MOHS SURGERY     x 2    Family History  Problem Relation Age of Onset  . Breast cancer Mother   . Other Mother        small cell carcinoma  . Hypertension Father   . Colon polyps Father   . Colon polyps Brother     Allergies  Allergen Reactions  . Levaquin [Levofloxacin]     Aortic Aneurysm    Current Outpatient Medications on File Prior to Visit  Medication Sig Dispense Refill  . acetaminophen (TYLENOL) 500 MG tablet Take 1,500 mg by mouth at bedtime as needed for mild pain or moderate pain.     Marland Kitchen allopurinol (ZYLOPRIM) 300 MG tablet TAKE 1 TABLET BY MOUTH EVERY DAY 90 tablet 0  .  atorvastatin (LIPITOR) 40 MG tablet TAKE 1 TABLET BY MOUTH EVERYDAY AT BEDTIME 90 tablet 0  . cetirizine (ZYRTEC) 10 MG tablet Take 10 mg by mouth daily.    . colchicine 0.6 MG tablet Take 0.6 mg by mouth daily as needed (Flair up gout).     . Homeopathic Products (LEG CRAMPS PO) Take 2 tablets by mouth at bedtime as needed (leg cramps).     . metoprolol succinate (TOPROL-XL) 50 MG 24 hr tablet TAKE 1 TABLET BY MOUTH EVERY DAY 90 tablet 0  . rivaroxaban (XARELTO) 20 MG TABS tablet Take 1 tablet (20 mg total) by mouth daily with supper. 90 tablet 3  . sacubitril-valsartan (ENTRESTO) 49-51 MG Take 1 tablet by mouth 2 (two) times daily. 60 tablet 11  . sucralfate (CARAFATE) 1 g tablet  Take 1 tablet (1 g total) by mouth 4 (four) times daily -  with meals and at bedtime. 360 tablet 0   No current facility-administered medications on file prior to visit.    BP 138/82 (BP Location: Left Arm, Patient Position: Sitting, Cuff Size: Small)   Temp 98.2 F (36.8 C) (Oral)   Resp 16   Ht 6\' 3"  (1.905 m)   Wt 282 lb 12.8 oz (128.3 kg)   BMI 35.35 kg/m       Objective:   Physical Exam Vitals and nursing note reviewed.  Constitutional:      General: He is not in acute distress.    Appearance: Normal appearance. He is well-developed. He is obese.  HENT:     Head: Normocephalic and atraumatic.     Right Ear: Tympanic membrane, ear canal and external ear normal. There is no impacted cerumen.     Left Ear: Tympanic membrane, ear canal and external ear normal. There is no impacted cerumen.     Nose: Nose normal. No congestion or rhinorrhea.     Mouth/Throat:     Mouth: Mucous membranes are moist.     Pharynx: Oropharynx is clear. No oropharyngeal exudate or posterior oropharyngeal erythema.  Eyes:     General:        Right eye: No discharge.        Left eye: No discharge.     Extraocular Movements: Extraocular movements intact.     Conjunctiva/sclera: Conjunctivae normal.     Pupils: Pupils are  equal, round, and reactive to light.  Neck:     Vascular: No carotid bruit.     Trachea: No tracheal deviation.  Cardiovascular:     Rate and Rhythm: Normal rate and regular rhythm.     Pulses: Normal pulses.     Heart sounds: Normal heart sounds. No murmur heard. No friction rub. No gallop.   Pulmonary:     Effort: Pulmonary effort is normal. No respiratory distress.     Breath sounds: Normal breath sounds. No stridor. No wheezing, rhonchi or rales.  Chest:     Chest wall: No tenderness.  Abdominal:     General: Bowel sounds are normal. There is no distension.     Palpations: Abdomen is soft. There is no mass.     Tenderness: There is no abdominal tenderness. There is no right CVA tenderness, left CVA tenderness, guarding or rebound.     Hernia: No hernia is present.  Musculoskeletal:        General: No swelling, tenderness, deformity or signs of injury. Normal range of motion.     Right lower leg: Edema present.     Left lower leg: Edema present.  Lymphadenopathy:     Cervical: No cervical adenopathy.  Skin:    General: Skin is warm and dry.     Capillary Refill: Capillary refill takes less than 2 seconds.     Coloration: Skin is not jaundiced or pale.     Findings: No bruising, erythema, lesion or rash.  Neurological:     General: No focal deficit present.     Mental Status: He is alert and oriented to person, place, and time.     Cranial Nerves: No cranial nerve deficit.     Sensory: No sensory deficit.     Motor: No weakness.     Coordination: Coordination normal.     Gait: Gait normal.     Deep Tendon Reflexes: Reflexes normal.  Psychiatric:  Mood and Affect: Mood normal.        Behavior: Behavior normal.        Thought Content: Thought content normal.        Judgment: Judgment normal.       Assessment & Plan:  1. Routine general medical examination at a health care facility - Encouraged weight loss with diet and exercise - Lipid and liver panel done by  cardiology 3 weeks ago. No need to repeat  - CBC with Differential/Platelet; Future - TSH; Future  2. Gastroesophageal reflux disease with esophagitis without hemorrhage - May need referral to GI if gastric band is not the issue  - CBC with Differential/Platelet; Future - TSH; Future  3. Atrial fibrillation, unspecified type (HCC) - NSR today  - CBC with Differential/Platelet; Future - TSH; Future  4. Essential hypertension - Well controlled. No change in medications  - CBC with Differential/Platelet; Future - TSH; Future  5. Hyperlipidemia, unspecified hyperlipidemia type - Continue with statin therapy  - CBC with Differential/Platelet; Future - TSH; Future  6. TOBACCO USE - Congratulated on quitting smoking  - CBC with Differential/Platelet; Future - TSH; Future  7. OSA (obstructive sleep apnea)  - CBC with Differential/Platelet; Future - TSH; Future  8. Prostate cancer screening  - PSA; Future  9. 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 Typical atrial flutter ([place]) - Continue with Cardiology plan of care - CBC with Differential/Platelet; Future - TSH; Future  11. Gastric banding status - Follow up with bariatric surgery as directed - CBC with Differential/Platelet; Future - TSH; Future  12. Chronic gout involving toe without tophus, unspecified cause, unspecified laterality - Continue with allopurinol  - CBC with Differential/Platelet; Future - TSH; Future   Dorothyann Peng, NP

## 2020-08-06 ENCOUNTER — Encounter: Payer: Self-pay | Admitting: Adult Health

## 2020-08-08 ENCOUNTER — Other Ambulatory Visit: Payer: Self-pay | Admitting: Adult Health

## 2020-08-08 DIAGNOSIS — K2100 Gastro-esophageal reflux disease with esophagitis, without bleeding: Secondary | ICD-10-CM

## 2020-08-12 ENCOUNTER — Encounter: Payer: Self-pay | Admitting: Adult Health

## 2020-08-13 ENCOUNTER — Other Ambulatory Visit: Payer: Self-pay | Admitting: Adult Health

## 2020-08-13 DIAGNOSIS — Z9884 Bariatric surgery status: Secondary | ICD-10-CM

## 2020-08-28 ENCOUNTER — Encounter: Payer: Self-pay | Admitting: Adult Health

## 2020-08-31 ENCOUNTER — Other Ambulatory Visit: Payer: Self-pay | Admitting: Adult Health

## 2020-09-05 ENCOUNTER — Other Ambulatory Visit: Payer: Self-pay | Admitting: General Surgery

## 2020-09-05 DIAGNOSIS — Z9884 Bariatric surgery status: Secondary | ICD-10-CM

## 2020-09-12 ENCOUNTER — Encounter: Payer: Self-pay | Admitting: Adult Health

## 2020-09-24 ENCOUNTER — Encounter: Payer: Self-pay | Admitting: Adult Health

## 2020-09-25 ENCOUNTER — Ambulatory Visit: Payer: BC Managed Care – PPO | Admitting: Adult Health

## 2020-09-25 ENCOUNTER — Encounter: Payer: Self-pay | Admitting: Adult Health

## 2020-09-25 ENCOUNTER — Other Ambulatory Visit: Payer: Self-pay

## 2020-09-25 VITALS — BP 160/80 | HR 68 | Temp 97.9°F | Ht 75.0 in | Wt 297.0 lb

## 2020-09-25 DIAGNOSIS — S0990XA Unspecified injury of head, initial encounter: Secondary | ICD-10-CM

## 2020-09-25 DIAGNOSIS — I872 Venous insufficiency (chronic) (peripheral): Secondary | ICD-10-CM

## 2020-09-25 MED ORDER — FUROSEMIDE 20 MG PO TABS: 20.0000 mg | ORAL_TABLET | Freq: Every day | ORAL | 3 refills | Status: DC
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
## 2020-09-29 ENCOUNTER — Emergency Department (HOSPITAL_COMMUNITY): Payer: BC Managed Care – PPO

## 2020-09-29 ENCOUNTER — Encounter (HOSPITAL_COMMUNITY): Payer: Self-pay

## 2020-09-29 ENCOUNTER — Other Ambulatory Visit: Payer: Self-pay

## 2020-09-29 ENCOUNTER — Inpatient Hospital Stay (HOSPITAL_COMMUNITY)
Admission: EM | Admit: 2020-09-29 | Discharge: 2020-11-05 | DRG: 233 | Disposition: A | Discharge status: Home Health | Payer: BC Managed Care – PPO | Attending: Cardiothoracic Surgery | Admitting: Cardiothoracic Surgery

## 2020-09-29 DIAGNOSIS — I825Z2 Chronic embolism and thrombosis of unspecified deep veins of left distal lower extremity: Secondary | ICD-10-CM | POA: Diagnosis present

## 2020-09-29 DIAGNOSIS — R188 Other ascites: Secondary | ICD-10-CM | POA: Diagnosis not present

## 2020-09-29 DIAGNOSIS — I252 Old myocardial infarction: Secondary | ICD-10-CM

## 2020-09-29 DIAGNOSIS — E669 Obesity, unspecified: Secondary | ICD-10-CM | POA: Diagnosis present

## 2020-09-29 DIAGNOSIS — Z9889 Other specified postprocedural states: Secondary | ICD-10-CM

## 2020-09-29 DIAGNOSIS — F0781 Postconcussional syndrome: Secondary | ICD-10-CM | POA: Diagnosis present

## 2020-09-29 DIAGNOSIS — J90 Pleural effusion, not elsewhere classified: Secondary | ICD-10-CM

## 2020-09-29 DIAGNOSIS — K5900 Constipation, unspecified: Secondary | ICD-10-CM | POA: Diagnosis not present

## 2020-09-29 DIAGNOSIS — Z85828 Personal history of other malignant neoplasm of skin: Secondary | ICD-10-CM

## 2020-09-29 DIAGNOSIS — Z888 Allergy status to other drugs, medicaments and biological substances status: Secondary | ICD-10-CM

## 2020-09-29 DIAGNOSIS — Z951 Presence of aortocoronary bypass graft: Secondary | ICD-10-CM

## 2020-09-29 DIAGNOSIS — D638 Anemia in other chronic diseases classified elsewhere: Secondary | ICD-10-CM | POA: Diagnosis present

## 2020-09-29 DIAGNOSIS — I440 Atrioventricular block, first degree: Secondary | ICD-10-CM | POA: Diagnosis not present

## 2020-09-29 DIAGNOSIS — K567 Ileus, unspecified: Secondary | ICD-10-CM

## 2020-09-29 DIAGNOSIS — A419 Sepsis, unspecified organism: Secondary | ICD-10-CM

## 2020-09-29 DIAGNOSIS — I5043 Acute on chronic combined systolic (congestive) and diastolic (congestive) heart failure: Secondary | ICD-10-CM

## 2020-09-29 DIAGNOSIS — I130 Hypertensive heart and chronic kidney disease with heart failure and stage 1 through stage 4 chronic kidney disease, or unspecified chronic kidney disease: Secondary | ICD-10-CM | POA: Diagnosis present

## 2020-09-29 DIAGNOSIS — Q211 Atrial septal defect: Secondary | ICD-10-CM

## 2020-09-29 DIAGNOSIS — I712 Thoracic aortic aneurysm, without rupture: Secondary | ICD-10-CM | POA: Diagnosis present

## 2020-09-29 DIAGNOSIS — K56600 Partial intestinal obstruction, unspecified as to cause: Secondary | ICD-10-CM | POA: Diagnosis not present

## 2020-09-29 DIAGNOSIS — J918 Pleural effusion in other conditions classified elsewhere: Secondary | ICD-10-CM | POA: Diagnosis not present

## 2020-09-29 DIAGNOSIS — G9389 Other specified disorders of brain: Secondary | ICD-10-CM | POA: Diagnosis present

## 2020-09-29 DIAGNOSIS — I959 Hypotension, unspecified: Secondary | ICD-10-CM | POA: Diagnosis not present

## 2020-09-29 DIAGNOSIS — B9689 Other specified bacterial agents as the cause of diseases classified elsewhere: Secondary | ICD-10-CM | POA: Diagnosis not present

## 2020-09-29 DIAGNOSIS — R7881 Bacteremia: Secondary | ICD-10-CM | POA: Diagnosis not present

## 2020-09-29 DIAGNOSIS — I483 Typical atrial flutter: Secondary | ICD-10-CM | POA: Diagnosis present

## 2020-09-29 DIAGNOSIS — Z803 Family history of malignant neoplasm of breast: Secondary | ICD-10-CM

## 2020-09-29 DIAGNOSIS — J439 Emphysema, unspecified: Secondary | ICD-10-CM | POA: Diagnosis present

## 2020-09-29 DIAGNOSIS — I249 Acute ischemic heart disease, unspecified: Secondary | ICD-10-CM

## 2020-09-29 DIAGNOSIS — Z9884 Bariatric surgery status: Secondary | ICD-10-CM

## 2020-09-29 DIAGNOSIS — M868X8 Other osteomyelitis, other site: Secondary | ICD-10-CM | POA: Diagnosis not present

## 2020-09-29 DIAGNOSIS — K219 Gastro-esophageal reflux disease without esophagitis: Secondary | ICD-10-CM | POA: Diagnosis present

## 2020-09-29 DIAGNOSIS — I214 Non-ST elevation (NSTEMI) myocardial infarction: Secondary | ICD-10-CM | POA: Diagnosis not present

## 2020-09-29 DIAGNOSIS — Z79899 Other long term (current) drug therapy: Secondary | ICD-10-CM

## 2020-09-29 DIAGNOSIS — I82532 Chronic embolism and thrombosis of left popliteal vein: Secondary | ICD-10-CM | POA: Diagnosis present

## 2020-09-29 DIAGNOSIS — E785 Hyperlipidemia, unspecified: Secondary | ICD-10-CM | POA: Diagnosis present

## 2020-09-29 DIAGNOSIS — I2510 Atherosclerotic heart disease of native coronary artery without angina pectoris: Secondary | ICD-10-CM | POA: Diagnosis present

## 2020-09-29 DIAGNOSIS — J939 Pneumothorax, unspecified: Secondary | ICD-10-CM

## 2020-09-29 DIAGNOSIS — Z7901 Long term (current) use of anticoagulants: Secondary | ICD-10-CM

## 2020-09-29 DIAGNOSIS — I878 Other specified disorders of veins: Secondary | ICD-10-CM | POA: Diagnosis present

## 2020-09-29 DIAGNOSIS — I2584 Coronary atherosclerosis due to calcified coronary lesion: Secondary | ICD-10-CM | POA: Diagnosis present

## 2020-09-29 DIAGNOSIS — E871 Hypo-osmolality and hyponatremia: Secondary | ICD-10-CM | POA: Diagnosis not present

## 2020-09-29 DIAGNOSIS — M96843 Postprocedural seroma of a musculoskeletal structure following other procedure: Secondary | ICD-10-CM | POA: Diagnosis not present

## 2020-09-29 DIAGNOSIS — R109 Unspecified abdominal pain: Secondary | ICD-10-CM

## 2020-09-29 DIAGNOSIS — R519 Headache, unspecified: Secondary | ICD-10-CM | POA: Diagnosis present

## 2020-09-29 DIAGNOSIS — J9811 Atelectasis: Secondary | ICD-10-CM | POA: Diagnosis not present

## 2020-09-29 DIAGNOSIS — R001 Bradycardia, unspecified: Secondary | ICD-10-CM | POA: Diagnosis not present

## 2020-09-29 DIAGNOSIS — R5381 Other malaise: Secondary | ICD-10-CM | POA: Diagnosis present

## 2020-09-29 DIAGNOSIS — I25118 Atherosclerotic heart disease of native coronary artery with other forms of angina pectoris: Secondary | ICD-10-CM | POA: Diagnosis not present

## 2020-09-29 DIAGNOSIS — I350 Nonrheumatic aortic (valve) stenosis: Secondary | ICD-10-CM | POA: Diagnosis present

## 2020-09-29 DIAGNOSIS — M109 Gout, unspecified: Secondary | ICD-10-CM | POA: Diagnosis present

## 2020-09-29 DIAGNOSIS — F17210 Nicotine dependence, cigarettes, uncomplicated: Secondary | ICD-10-CM | POA: Diagnosis present

## 2020-09-29 DIAGNOSIS — E66812 Obesity, class 2: Secondary | ICD-10-CM | POA: Diagnosis present

## 2020-09-29 DIAGNOSIS — A4153 Sepsis due to Serratia: Secondary | ICD-10-CM

## 2020-09-29 DIAGNOSIS — Z20822 Contact with and (suspected) exposure to covid-19: Secondary | ICD-10-CM | POA: Diagnosis present

## 2020-09-29 DIAGNOSIS — I255 Ischemic cardiomyopathy: Secondary | ICD-10-CM | POA: Diagnosis present

## 2020-09-29 DIAGNOSIS — K310 Acute dilatation of stomach: Secondary | ICD-10-CM | POA: Diagnosis not present

## 2020-09-29 DIAGNOSIS — K30 Functional dyspepsia: Secondary | ICD-10-CM | POA: Diagnosis present

## 2020-09-29 DIAGNOSIS — G4733 Obstructive sleep apnea (adult) (pediatric): Secondary | ICD-10-CM | POA: Diagnosis present

## 2020-09-29 DIAGNOSIS — N990 Postprocedural (acute) (chronic) kidney failure: Secondary | ICD-10-CM | POA: Diagnosis not present

## 2020-09-29 DIAGNOSIS — Z8249 Family history of ischemic heart disease and other diseases of the circulatory system: Secondary | ICD-10-CM

## 2020-09-29 DIAGNOSIS — N170 Acute kidney failure with tubular necrosis: Secondary | ICD-10-CM | POA: Diagnosis not present

## 2020-09-29 DIAGNOSIS — I480 Paroxysmal atrial fibrillation: Secondary | ICD-10-CM | POA: Diagnosis present

## 2020-09-29 DIAGNOSIS — D62 Acute posthemorrhagic anemia: Secondary | ICD-10-CM | POA: Diagnosis not present

## 2020-09-29 DIAGNOSIS — Y838 Other surgical procedures as the cause of abnormal reaction of the patient, or of later complication, without mention of misadventure at the time of the procedure: Secondary | ICD-10-CM | POA: Diagnosis not present

## 2020-09-29 DIAGNOSIS — T8149XA Infection following a procedure, other surgical site, initial encounter: Secondary | ICD-10-CM | POA: Diagnosis not present

## 2020-09-29 DIAGNOSIS — Z6836 Body mass index (BMI) 36.0-36.9, adult: Secondary | ICD-10-CM

## 2020-09-29 DIAGNOSIS — R0789 Other chest pain: Secondary | ICD-10-CM

## 2020-09-29 DIAGNOSIS — N189 Chronic kidney disease, unspecified: Secondary | ICD-10-CM | POA: Diagnosis present

## 2020-09-29 DIAGNOSIS — I10 Essential (primary) hypertension: Secondary | ICD-10-CM

## 2020-09-29 DIAGNOSIS — Z4659 Encounter for fitting and adjustment of other gastrointestinal appliance and device: Secondary | ICD-10-CM

## 2020-09-29 DIAGNOSIS — Z8371 Family history of colonic polyps: Secondary | ICD-10-CM

## 2020-09-29 DIAGNOSIS — I4891 Unspecified atrial fibrillation: Secondary | ICD-10-CM

## 2020-09-29 DIAGNOSIS — Z7982 Long term (current) use of aspirin: Secondary | ICD-10-CM

## 2020-09-29 DIAGNOSIS — K9189 Other postprocedural complications and disorders of digestive system: Secondary | ICD-10-CM | POA: Diagnosis not present

## 2020-09-29 DIAGNOSIS — I454 Nonspecific intraventricular block: Secondary | ICD-10-CM | POA: Diagnosis present

## 2020-09-29 LAB — CBC WITH DIFFERENTIAL/PLATELET
Abs Immature Granulocytes: 0.04 10*3/uL (ref 0.00–0.07)
Basophils Absolute: 0.1 10*3/uL (ref 0.0–0.1)
Basophils Relative: 1 %
Eosinophils Absolute: 0.2 10*3/uL (ref 0.0–0.5)
Eosinophils Relative: 2 %
HCT: 36.7 % — ABNORMAL LOW (ref 39.0–52.0)
Hemoglobin: 11.9 g/dL — ABNORMAL LOW (ref 13.0–17.0)
Immature Granulocytes: 0 %
Lymphocytes Relative: 16 %
Lymphs Abs: 1.6 10*3/uL (ref 0.7–4.0)
MCH: 32.4 pg (ref 26.0–34.0)
MCHC: 32.4 g/dL (ref 30.0–36.0)
MCV: 100 fL (ref 80.0–100.0)
Monocytes Absolute: 0.9 10*3/uL (ref 0.1–1.0)
Monocytes Relative: 9 %
Neutro Abs: 7.6 10*3/uL (ref 1.7–7.7)
Neutrophils Relative %: 72 %
Platelets: 253 10*3/uL (ref 150–400)
RBC: 3.67 MIL/uL — ABNORMAL LOW (ref 4.22–5.81)
RDW: 13.9 % (ref 11.5–15.5)
WBC: 10.3 10*3/uL (ref 4.0–10.5)
nRBC: 0 % (ref 0.0–0.2)

## 2020-09-29 LAB — COMPREHENSIVE METABOLIC PANEL
ALT: 22 U/L (ref 0–44)
AST: 17 U/L (ref 15–41)
Albumin: 3.6 g/dL (ref 3.5–5.0)
Alkaline Phosphatase: 83 U/L (ref 38–126)
Anion gap: 8 (ref 5–15)
BUN: 22 mg/dL — ABNORMAL HIGH (ref 6–20)
CO2: 25 mmol/L (ref 22–32)
Calcium: 8.8 mg/dL — ABNORMAL LOW (ref 8.9–10.3)
Chloride: 100 mmol/L (ref 98–111)
Creatinine, Ser: 1.22 mg/dL (ref 0.61–1.24)
GFR, Estimated: >60 mL/min (ref >60)
Glucose, Bld: 98 mg/dL (ref 70–99)
Potassium: 3.7 mmol/L (ref 3.5–5.1)
Sodium: 133 mmol/L — ABNORMAL LOW (ref 135–145)
Total Bilirubin: 0.4 mg/dL (ref 0.3–1.2)
Total Protein: 7.3 g/dL (ref 6.5–8.1)

## 2020-09-29 LAB — SALICYLATE LEVEL: Salicylate Lvl: <7 mg/dL — ABNORMAL LOW (ref 7.0–30.0)

## 2020-09-29 LAB — TROPONIN I (HIGH SENSITIVITY): Troponin I (High Sensitivity): 29 ng/L — ABNORMAL HIGH (ref <18)

## 2020-09-29 LAB — LIPASE, BLOOD: Lipase: 27 U/L (ref 11–51)

## 2020-09-29 MED ORDER — ALUM & MAG HYDROXIDE-SIMETH 200-200-20 MG/5ML PO SUSP
30.0000 mL | Freq: Once | ORAL | Status: AC
  Administered 2020-09-29: 30 mL via ORAL
  Filled 2020-09-29: qty 30

## 2020-09-29 NOTE — ED Provider Notes (Signed)
11:11 PM Assumed care from Dr. Sabra Heck, please see their note for full history, physical and decision making until this point. In brief this is a 60 y.o. year old male who presented to the ED tonight with Chest Pain     Mildly elevated troponins in past. H/o CHF but improving. Chest pain (sounds like indigestion) however tonight it is radiating to left arm. ECG with some ectopy with EMS, ok here. Pending labs/troponins for disposition.   First toroponin unremarkable but second was significantly higher. Heparin started. Talked to Dr. Radford Pax who recommends  medicine admission.   Discussed with Dr. Olevia Bowens. [facility] to [facility]. Cards consult on arrival.    CRITICAL CARE Performed by: Merrily Pew Total critical care time: 35 minutes Critical care time was exclusive of separately billable procedures and treating other patients. Critical care was necessary to treat or prevent imminent or life-threatening deterioration. Critical care was time spent personally by me on the following activities: development of treatment plan with patient and/or surrogate as well as nursing, discussions with consultants, evaluation of patient's response to treatment, examination of patient, obtaining history from patient or surrogate, ordering and performing treatments and interventions, ordering and review of laboratory studies, ordering and review of radiographic studies, pulse oximetry and re-evaluation of patient's condition.   Labs, studies and imaging reviewed by myself and considered in medical decision making if ordered. Imaging interpreted by radiology.  Labs Reviewed  CBC WITH DIFFERENTIAL/PLATELET - Abnormal; Notable for the following components:      Result Value   RBC 3.67 (*)    Hemoglobin 11.9 (*)    HCT 36.7 (*)    All other components within normal limits  COMPREHENSIVE METABOLIC PANEL  LIPASE, BLOOD  SALICYLATE LEVEL  TROPONIN I (HIGH SENSITIVITY)    DG Chest Port 1 View  Final Result       No follow-ups on file.    Manjinder Breau, Corene Cornea, MD 09/30/20 [phone]
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
## 2020-09-29 NOTE — ED Triage Notes (Signed)
BIB EMS c/o heartburn, headache, and chest tightness. Trigeminy with EMS PTA.

## 2020-09-30 ENCOUNTER — Encounter (HOSPITAL_COMMUNITY): Payer: Self-pay | Admitting: Internal Medicine

## 2020-09-30 ENCOUNTER — Other Ambulatory Visit: Payer: BC Managed Care – PPO

## 2020-09-30 DIAGNOSIS — E669 Obesity, unspecified: Secondary | ICD-10-CM

## 2020-09-30 DIAGNOSIS — Z01810 Encounter for preprocedural cardiovascular examination: Secondary | ICD-10-CM | POA: Diagnosis not present

## 2020-09-30 DIAGNOSIS — N170 Acute kidney failure with tubular necrosis: Secondary | ICD-10-CM | POA: Diagnosis not present

## 2020-09-30 DIAGNOSIS — K567 Ileus, unspecified: Secondary | ICD-10-CM | POA: Diagnosis not present

## 2020-09-30 DIAGNOSIS — E782 Mixed hyperlipidemia: Secondary | ICD-10-CM | POA: Diagnosis not present

## 2020-09-30 DIAGNOSIS — N189 Chronic kidney disease, unspecified: Secondary | ICD-10-CM | POA: Diagnosis present

## 2020-09-30 DIAGNOSIS — G9389 Other specified disorders of brain: Secondary | ICD-10-CM | POA: Diagnosis present

## 2020-09-30 DIAGNOSIS — M7989 Other specified soft tissue disorders: Secondary | ICD-10-CM | POA: Diagnosis not present

## 2020-09-30 DIAGNOSIS — I4892 Unspecified atrial flutter: Secondary | ICD-10-CM | POA: Diagnosis not present

## 2020-09-30 DIAGNOSIS — I214 Non-ST elevation (NSTEMI) myocardial infarction: Secondary | ICD-10-CM | POA: Diagnosis present

## 2020-09-30 DIAGNOSIS — I25118 Atherosclerotic heart disease of native coronary artery with other forms of angina pectoris: Secondary | ICD-10-CM | POA: Diagnosis present

## 2020-09-30 DIAGNOSIS — J918 Pleural effusion in other conditions classified elsewhere: Secondary | ICD-10-CM | POA: Diagnosis not present

## 2020-09-30 DIAGNOSIS — E871 Hypo-osmolality and hyponatremia: Secondary | ICD-10-CM | POA: Diagnosis not present

## 2020-09-30 DIAGNOSIS — D638 Anemia in other chronic diseases classified elsewhere: Secondary | ICD-10-CM | POA: Diagnosis present

## 2020-09-30 DIAGNOSIS — I5043 Acute on chronic combined systolic (congestive) and diastolic (congestive) heart failure: Secondary | ICD-10-CM | POA: Diagnosis not present

## 2020-09-30 DIAGNOSIS — I25110 Atherosclerotic heart disease of native coronary artery with unstable angina pectoris: Secondary | ICD-10-CM | POA: Diagnosis not present

## 2020-09-30 DIAGNOSIS — I10 Essential (primary) hypertension: Secondary | ICD-10-CM | POA: Diagnosis not present

## 2020-09-30 DIAGNOSIS — I429 Cardiomyopathy, unspecified: Secondary | ICD-10-CM

## 2020-09-30 DIAGNOSIS — Y838 Other surgical procedures as the cause of abnormal reaction of the patient, or of later complication, without mention of misadventure at the time of the procedure: Secondary | ICD-10-CM | POA: Diagnosis not present

## 2020-09-30 DIAGNOSIS — I130 Hypertensive heart and chronic kidney disease with heart failure and stage 1 through stage 4 chronic kidney disease, or unspecified chronic kidney disease: Secondary | ICD-10-CM | POA: Diagnosis present

## 2020-09-30 DIAGNOSIS — I350 Nonrheumatic aortic (valve) stenosis: Secondary | ICD-10-CM | POA: Diagnosis present

## 2020-09-30 DIAGNOSIS — D62 Acute posthemorrhagic anemia: Secondary | ICD-10-CM | POA: Diagnosis not present

## 2020-09-30 DIAGNOSIS — M79606 Pain in leg, unspecified: Secondary | ICD-10-CM | POA: Diagnosis not present

## 2020-09-30 DIAGNOSIS — Z86718 Personal history of other venous thrombosis and embolism: Secondary | ICD-10-CM | POA: Diagnosis not present

## 2020-09-30 DIAGNOSIS — I483 Typical atrial flutter: Secondary | ICD-10-CM | POA: Diagnosis not present

## 2020-09-30 DIAGNOSIS — Q211 Atrial septal defect: Secondary | ICD-10-CM | POA: Diagnosis not present

## 2020-09-30 DIAGNOSIS — R7881 Bacteremia: Secondary | ICD-10-CM | POA: Diagnosis not present

## 2020-09-30 DIAGNOSIS — J439 Emphysema, unspecified: Secondary | ICD-10-CM | POA: Diagnosis present

## 2020-09-30 DIAGNOSIS — I4891 Unspecified atrial fibrillation: Secondary | ICD-10-CM | POA: Diagnosis not present

## 2020-09-30 DIAGNOSIS — I5040 Unspecified combined systolic (congestive) and diastolic (congestive) heart failure: Secondary | ICD-10-CM | POA: Diagnosis not present

## 2020-09-30 DIAGNOSIS — I712 Thoracic aortic aneurysm, without rupture: Secondary | ICD-10-CM | POA: Diagnosis present

## 2020-09-30 DIAGNOSIS — I249 Acute ischemic heart disease, unspecified: Secondary | ICD-10-CM

## 2020-09-30 DIAGNOSIS — Z6836 Body mass index (BMI) 36.0-36.9, adult: Secondary | ICD-10-CM | POA: Diagnosis not present

## 2020-09-30 DIAGNOSIS — A419 Sepsis, unspecified organism: Secondary | ICD-10-CM | POA: Diagnosis not present

## 2020-09-30 DIAGNOSIS — A4153 Sepsis due to Serratia: Secondary | ICD-10-CM | POA: Diagnosis not present

## 2020-09-30 DIAGNOSIS — Z20822 Contact with and (suspected) exposure to covid-19: Secondary | ICD-10-CM | POA: Diagnosis present

## 2020-09-30 DIAGNOSIS — E66812 Obesity, class 2: Secondary | ICD-10-CM

## 2020-09-30 DIAGNOSIS — M79669 Pain in unspecified lower leg: Secondary | ICD-10-CM | POA: Diagnosis not present

## 2020-09-30 DIAGNOSIS — I2510 Atherosclerotic heart disease of native coronary artery without angina pectoris: Secondary | ICD-10-CM | POA: Diagnosis not present

## 2020-09-30 DIAGNOSIS — R188 Other ascites: Secondary | ICD-10-CM | POA: Diagnosis not present

## 2020-09-30 DIAGNOSIS — K56600 Partial intestinal obstruction, unspecified as to cause: Secondary | ICD-10-CM | POA: Diagnosis not present

## 2020-09-30 DIAGNOSIS — I82532 Chronic embolism and thrombosis of left popliteal vein: Secondary | ICD-10-CM | POA: Diagnosis not present

## 2020-09-30 DIAGNOSIS — T8132XA Disruption of internal operation (surgical) wound, not elsewhere classified, initial encounter: Secondary | ICD-10-CM | POA: Diagnosis not present

## 2020-09-30 DIAGNOSIS — I959 Hypotension, unspecified: Secondary | ICD-10-CM | POA: Diagnosis not present

## 2020-09-30 HISTORY — DX: Obesity, unspecified: E66.9

## 2020-09-30 HISTORY — DX: Obesity, class 2: E66.812

## 2020-09-30 LAB — TROPONIN I (HIGH SENSITIVITY)
Troponin I (High Sensitivity): 862 ng/L (ref <18)
Troponin I (High Sensitivity): 3620 ng/L (ref <18)
Troponin I (High Sensitivity): 12041 ng/L (ref <18)

## 2020-09-30 LAB — MAGNESIUM: Magnesium: 1.7 mg/dL (ref 1.7–2.4)

## 2020-09-30 LAB — APTT: aPTT: 45 seconds — ABNORMAL HIGH (ref 24–36)

## 2020-09-30 LAB — HEPARIN LEVEL (UNFRACTIONATED): Heparin Unfractionated: >1.1 IU/mL — ABNORMAL HIGH (ref 0.30–0.70)

## 2020-09-30 LAB — PHOSPHORUS: Phosphorus: 3.3 mg/dL (ref 2.5–4.6)

## 2020-09-30 LAB — RESP PANEL BY RT-PCR (FLU A&B, COVID) ARPGX2
Influenza A by PCR: NEGATIVE
Influenza B by PCR: NEGATIVE
SARS Coronavirus 2 by RT PCR: NEGATIVE

## 2020-09-30 MED ORDER — MAGNESIUM SULFATE 2 GM/50ML IV SOLN
2.0000 g | Freq: Once | INTRAVENOUS | Status: AC
  Administered 2020-09-30: 2 g via INTRAVENOUS
  Filled 2020-09-30: qty 50

## 2020-09-30 MED ORDER — ONDANSETRON HCL 4 MG/2ML IJ SOLN: 4.0000 mg | Freq: Four times a day (QID) | INTRAMUSCULAR | Status: DC | PRN

## 2020-09-30 MED ORDER — HEPARIN (PORCINE) 25000 UT/250ML-% IV SOLN
1700.0000 [IU]/h | INTRAVENOUS | Status: DC
  Administered 2020-09-30: 1500 [IU]/h via INTRAVENOUS
  Administered 2020-10-01: 1700 [IU]/h via INTRAVENOUS
  Filled 2020-09-30 (×2): qty 250

## 2020-09-30 MED ORDER — FUROSEMIDE 20 MG PO TABS
20.0000 mg | ORAL_TABLET | Freq: Every day | ORAL | Status: DC
  Administered 2020-09-30: 20 mg via ORAL
  Filled 2020-09-30: qty 1

## 2020-09-30 MED ORDER — ASPIRIN EC 81 MG PO TBEC
81.0000 mg | DELAYED_RELEASE_TABLET | Freq: Every day | ORAL | Status: DC
  Administered 2020-10-01: 81 mg via ORAL
  Filled 2020-09-30 (×2): qty 1

## 2020-09-30 MED ORDER — ALLOPURINOL 300 MG PO TABS
300.0000 mg | ORAL_TABLET | Freq: Every day | ORAL | Status: DC
  Administered 2020-09-30 – 2020-11-05 (×33): 300 mg via ORAL
  Filled 2020-09-30 (×35): qty 1

## 2020-09-30 MED ORDER — ACETAMINOPHEN 325 MG PO TABS
650.0000 mg | ORAL_TABLET | Freq: Four times a day (QID) | ORAL | Status: DC | PRN
Start: 2020-09-30 — End: 2020-09-30

## 2020-09-30 MED ORDER — ACETAMINOPHEN 650 MG RE SUPP: 650.0000 mg | Freq: Four times a day (QID) | RECTAL | Status: DC | PRN

## 2020-09-30 MED ORDER — SACUBITRIL-VALSARTAN 49-51 MG PO TABS: 1.0000 | ORAL_TABLET | Freq: Two times a day (BID) | ORAL | Status: DC

## 2020-09-30 MED ORDER — SODIUM CHLORIDE 0.9 % IV SOLN: INTRAVENOUS | Status: DC

## 2020-09-30 MED ORDER — ONDANSETRON HCL 4 MG PO TABS: 4.0000 mg | ORAL_TABLET | Freq: Four times a day (QID) | ORAL | Status: DC | PRN

## 2020-09-30 MED ORDER — ASPIRIN 81 MG PO CHEW
81.0000 mg | CHEWABLE_TABLET | ORAL | Status: AC
  Administered 2020-10-01: 81 mg via ORAL
  Filled 2020-09-30: qty 1

## 2020-09-30 MED ORDER — ACETAMINOPHEN 500 MG PO TABS
1000.0000 mg | ORAL_TABLET | Freq: Once | ORAL | Status: AC
  Administered 2020-09-30: 1000 mg via ORAL
  Filled 2020-09-30: qty 2

## 2020-09-30 MED ORDER — PANTOPRAZOLE SODIUM 40 MG IV SOLR
40.0000 mg | Freq: Once | INTRAVENOUS | Status: AC
  Administered 2020-09-30: 40 mg via INTRAVENOUS
  Filled 2020-09-30: qty 40

## 2020-09-30 MED ORDER — ASPIRIN 81 MG PO CHEW: 324.0000 mg | CHEWABLE_TABLET | ORAL | Status: DC

## 2020-09-30 MED ORDER — METOPROLOL SUCCINATE ER 50 MG PO TB24
50.0000 mg | ORAL_TABLET | Freq: Every day | ORAL | Status: DC
  Administered 2020-09-30 – 2020-10-02 (×3): 50 mg via ORAL
  Filled 2020-09-30 (×4): qty 1

## 2020-09-30 MED ORDER — ASPIRIN 300 MG RE SUPP: 300.0000 mg | RECTAL | Status: DC

## 2020-09-30 MED ORDER — ACETAMINOPHEN 325 MG PO TABS
650.0000 mg | ORAL_TABLET | ORAL | Status: DC | PRN
  Administered 2020-10-01 (×2): 650 mg via ORAL
  Filled 2020-09-30 (×3): qty 2

## 2020-09-30 MED ORDER — NITROGLYCERIN 0.4 MG SL SUBL
0.4000 mg | SUBLINGUAL_TABLET | SUBLINGUAL | Status: DC | PRN
  Administered 2020-09-30: 0.4 mg via SUBLINGUAL
  Filled 2020-09-30: qty 1

## 2020-09-30 MED ORDER — ATORVASTATIN CALCIUM 40 MG PO TABS
40.0000 mg | ORAL_TABLET | Freq: Every day | ORAL | Status: DC
  Administered 2020-09-30 – 2020-10-01 (×2): 40 mg via ORAL
  Filled 2020-09-30 (×3): qty 1

## 2020-09-30 MED ORDER — ALUM & MAG HYDROXIDE-SIMETH 200-200-20 MG/5ML PO SUSP
30.0000 mL | Freq: Once | ORAL | Status: AC
  Administered 2020-09-30: 30 mL via ORAL
  Filled 2020-09-30: qty 30

## 2020-09-30 MED ORDER — SODIUM CHLORIDE 0.9% FLUSH
3.0000 mL | Freq: Two times a day (BID) | INTRAVENOUS | Status: DC
  Administered 2020-10-01: 3 mL via INTRAVENOUS

## 2020-09-30 MED ORDER — PANTOPRAZOLE SODIUM 40 MG PO TBEC
40.0000 mg | DELAYED_RELEASE_TABLET | Freq: Two times a day (BID) | ORAL | Status: DC
  Administered 2020-09-30 – 2020-10-02 (×6): 40 mg via ORAL
  Filled 2020-09-30 (×7): qty 1

## 2020-09-30 MED ORDER — SODIUM CHLORIDE 0.9% FLUSH: 3.0000 mL | INTRAVENOUS | Status: DC | PRN

## 2020-09-30 MED ORDER — ONDANSETRON HCL 4 MG/2ML IJ SOLN
4.0000 mg | Freq: Four times a day (QID) | INTRAMUSCULAR | Status: DC | PRN
  Administered 2020-09-30: 4 mg via INTRAVENOUS
  Filled 2020-09-30: qty 2

## 2020-09-30 MED ORDER — COLCHICINE 0.6 MG PO TABS: 0.6000 mg | ORAL_TABLET | Freq: Every day | ORAL | Status: DC | PRN

## 2020-09-30 MED ORDER — SODIUM CHLORIDE 0.9 % IV SOLN: 250.0000 mL | INTRAVENOUS | Status: DC | PRN

## 2020-09-30 NOTE — Progress Notes (Signed)
ANTICOAGULATION CONSULT NOTE - Initial Consult  Pharmacy Consult for heparin Indication:  ACS with h/o DVT and Aflutter  Allergies  Allergen Reactions   Levaquin [Levofloxacin]     Aortic Aneurysm    Patient Measurements: Height: 6\' 3"  (190.5 cm) Weight: 134.7 kg (296 lb 15.4 oz) IBW/kg (Calculated) : 84.5 Heparin Dosing Weight: 115kg  Vital Signs: Temp: 98 F (36.7 C) (06/12 2246) BP: 148/76 (06/13 0120) Pulse Rate: 79 (06/13 0120)  Labs: Recent Labs    09/29/20 2245 09/30/20 0113  HGB 11.9*  --   HCT 36.7*  --   PLT 253  --   CREATININE 1.22  --   TROPONINIHS 29* 862*    Estimated Creatinine Clearance: 95.3 mL/min (by C-G formula based on SCr of 1.22 mg/dL).   Medical History: Past Medical History:  Diagnosis Date   Blood in urine    DDD (degenerative disc disease)    DVT (deep venous thrombosis) ([place]) 2017 & 2018   Gout    Heart murmur    Hyperlipidemia    Hypertension    Numbness and tingling    right arm and hand   SCC (squamous cell carcinoma)    skin, squamous cell, face    Assessment: 60yo male c/o CP that is similar to his reflux but pain occasionally radiates to left arm, initial troponin mildly elevated but now rising (29>862), to begin heparin.  Pt is on Xarelto for h/o DVT and Aflutter, last dose taken 6/12 9p.  Goal of Therapy:  Heparin level 0.3-0.7 units/ml aPTT 66-102 seconds Monitor platelets by anticoagulation protocol: Yes   Plan:  Heparin 1500 units/hr starting 24h after last Xarelto dose. Monitor heparin levels, aPTT (while Xarelto affects anti-Xa), and CBC.  Wynona Neat, PharmD, BCPS  09/30/2020,2:36 AM

## 2020-09-30 NOTE — ED Notes (Signed)
Date and time results received: 09/30/20 0203  Test: Troponin Critical Value: 862  Name of Provider Notified: Mes[place], MD  Orders Received? Or Actions Taken?: acknowledged

## 2020-09-30 NOTE — ED Notes (Signed)
Pt refusing telemetry at this time 

## 2020-09-30 NOTE — ED Notes (Signed)
Report given to Lukka with [facility] 

## 2020-09-30 NOTE — ED Notes (Signed)
Called RN and informed she will call back
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
## 2020-09-30 NOTE — ED Notes (Signed)
[facility] here to transport pt.   

## 2020-09-30 NOTE — ED Notes (Signed)
Pt requesting something for heartburn, given nitro SL

## 2020-09-30 NOTE — H&P (Signed)
History and Physical    Matthew Savage QIO:[id number] DOB: 1960/11/01 DOA: 09/29/2020  PCP: Dorothyann Peng, NP   Patient coming from: Home.  I have personally briefly reviewed patient's old medical records in [place]  Chief Complaint: Chest pain.  HPI: Matthew Savage is a 60 y.o. male with medical history significant of hematuria.  Class II obesity, DDD, chronic DVT, gout, heart murmur, hyperlipidemia, hypertension, lap band surgery, numbness and tingling, SCC of face, mild AVS, TAA, atrial fibrillation/typical atrial flutter with ablation by Dr. Caryl Comes on 02/2020, CAD multiple MIs, who is coming to the emergency department due to burning chest pain associated with frequent belching that did not improve with about taking Alka-Seltzer tablets taken by the patient's before coming to the hospital.  He denies dyspnea, nausea, emesis, diaphoresis, PND, orthopnea or recent pitting edema of the lower extremities.  Denies constipation, melena or hematochezia.  No dysuria, frequency or hematuria.  No polyuria, polydipsia, polyphagia or blurred vision.  ED Course: Initial vital signs were temperature 98 F, pulse 89, respirations 24, BP 173/99 mmHg and O2 sat 98% on room air.  The patient received 2 doses of 30 mL Maalox and 1000 mg acetaminophen.  I added magnesium sulfate 2 g IVPB and Protonix 40 mg IVPB.  Lab work: CBC showed a white count of 10.3, hemoglobin 11.9 g/dL and platelets 253.  Salicylate level was less than 7.0.  Troponin was 29 and then 862 ng/L.  Lipase was normal.  CMP showed a sodium 133 mmol/L.  BUN was 22 mg/dL, the rest of the CMP values are normal when calcium level of 8.8 mg/dL is corrected to albumin.  Imaging: A single view portable chest radiograph did not show any active cardiopulmonary disease.  Review of Systems: As per HPI otherwise all other systems reviewed and are negative.  Past Medical History:  Diagnosis Date   Blood in urine    Class 2 obesity 09/30/2020    DDD (degenerative disc disease)    DVT (deep venous thrombosis) ([place]) 2017 & 2018   Gout    Heart murmur    Hyperlipidemia    Hypertension    Numbness and tingling    right arm and hand   SCC (squamous cell carcinoma)    skin, squamous cell, face    Past Surgical History:  Procedure Laterality Date   A-FLUTTER ABLATION N/A 03/11/2020   Procedure: A-FLUTTER ABLATION;  Surgeon: Deboraha Sprang, MD;  Location: [facility];  Service: Cardiovascular;  Laterality: N/A;   HERNIA REPAIR  1967   X 2   LAPAROSCOPIC GASTRIC BANDING  03/2009   in [place], [place]  12/03/2011   X 2 (gluteal) X 1 (lower back)   MOHS SURGERY     x 2    Social History  reports that he quit smoking about 8 months ago. His smoking use included cigarettes. He has a 72.00 pack-year smoking history. He has never used smokeless tobacco. He reports current alcohol use of about 12.0 standard drinks of alcohol per week. He reports that he does not use drugs.  Allergies  Allergen Reactions   Levaquin [Levofloxacin]     Aortic Aneurysm    Family History  Problem Relation Age of Onset   Breast cancer Mother    Other Mother        small cell carcinoma   Hypertension Father    Colon polyps Father    Colon polyps Brother  Prior to Admission medications   Medication Sig Start Date End Date Taking? Authorizing Provider  acetaminophen (TYLENOL) 500 MG tablet Take 1,500 mg by mouth at bedtime as needed for mild pain or moderate pain.    Yes [provider]  allopurinol (ZYLOPRIM) 300 MG tablet TAKE 1 TABLET BY MOUTH EVERY DAY Patient taking differently: Take 300 mg by mouth daily. 07/09/20  Yes Nafziger, Tommi Rumps, NP  atorvastatin (LIPITOR) 40 MG tablet TAKE 1 TABLET BY MOUTH EVERYDAY AT BEDTIME Patient taking differently: Take 40 mg by mouth daily. 09/02/20  Yes Nafziger, Tommi Rumps, NP  colchicine 0.6 MG tablet Take 0.6 mg by mouth daily as needed (Flair up gout).    Yes [provider]   furosemide (LASIX) 20 MG tablet Take 1 tablet (20 mg total) by mouth daily. 09/25/20  Yes Nafziger, Tommi Rumps, NP  Homeopathic Products (LEG CRAMPS PO) Take 2 tablets by mouth at bedtime as needed (leg cramps).    Yes [provider]  metoprolol succinate (TOPROL-XL) 50 MG 24 hr tablet TAKE 1 TABLET BY MOUTH EVERY DAY Patient taking differently: Take 50 mg by mouth daily. 07/11/20  Yes Nafziger, Tommi Rumps, NP  rivaroxaban (XARELTO) 20 MG TABS tablet Take 1 tablet (20 mg total) by mouth daily with supper. 04/03/20  Yes Nafziger, Tommi Rumps, NP  sacubitril-valsartan (ENTRESTO) 49-51 MG Take 1 tablet by mouth 2 (two) times daily. 02/29/20  Yes Werner Lean, MD    Physical Exam: Vitals:   09/30/20 0300 09/30/20 0400 09/30/20 0500 09/30/20 0715  BP: (!) 142/69 137/81 (!) 144/81 (!) 146/77  Pulse: 72 75 73 74  Resp: (!) 21 13  15   Temp:      SpO2: 99% 100% 100% 99%  Weight:      Height:       Constitutional: NAD, calm, comfortable Eyes: PERRL, lids and conjunctivae normal ENMT: Mucous membranes are moist. Posterior pharynx clear of any exudate or lesions. Neck: normal, supple, no masses, no thyromegaly Respiratory: clear to auscultation bilaterally, no wheezing, no crackles. Normal respiratory effort. No accessory muscle use.  Cardiovascular: Regular rate and rhythm, 1/6 SEM, rubs / gallops.  Stage II lymphedema more pronounced on the left.  2+ pedal pulses. No carotid bruits.  Abdomen: Obese, no distention.  Bowel sounds positive.  Soft, no tenderness, no masses palpated. No hepatosplenomegaly. Musculoskeletal: no clubbing / cyanosis. Good ROM, no contractures. Normal muscle tone.  Skin: Facial skin shows multiple comedones.  Dryness and hyperkeratosis of extremities, particularly on pretibial areas. Neurologic: CN 2-12 grossly intact. Sensation intact, DTR normal. Strength 5/5 in all 4.  Psychiatric: Normal judgment and insight. Alert and oriented x 3. Normal mood.   Labs on Admission:  I have personally reviewed following labs and imaging studies  CBC: Recent Labs  Lab 09/29/20 2245  WBC 10.3  NEUTROABS 7.6  HGB 11.9*  HCT 36.7*  MCV 100.0  PLT 366    Basic Metabolic Panel: Recent Labs  Lab 09/29/20 2245 09/30/20 0113  NA 133*  --   K 3.7  --   CL 100  --   CO2 25  --   GLUCOSE 98  --   BUN 22*  --   CREATININE 1.22  --   CALCIUM 8.8*  --   MG  --  1.7  PHOS  --  3.3    GFR: Estimated Creatinine Clearance: 95.3 mL/min (by C-G formula based on SCr of 1.22 mg/dL).  Liver Function Tests: Recent Labs  Lab 09/29/20 2245  AST 17  ALT 22  ALKPHOS 83  BILITOT 0.4  PROT 7.3  ALBUMIN 3.6   Radiological Exams on Admission: DG Chest Port 1 View  Result Date: 09/29/2020 CLINICAL DATA:  Chest pain EXAM: PORTABLE CHEST 1 VIEW COMPARISON:  01/22/2020 FINDINGS: Heart and mediastinal contours are within normal limits. No focal opacities or effusions. No acute bony abnormality. IMPRESSION: No active disease. Electronically Signed   By: Rolm Baptise M.D.   On: 09/29/2020 23:02    Echocardiogram 06/05/20  IMPRESSIONS    1. Left ventricular ejection fraction, by estimation, is 50 to 55%. The  left ventricle has low normal function. The left ventricle demonstrates  regional wall motion abnormalities (see scoring diagram/findings for  description). There is moderate  concentric left ventricular hypertrophy. Left ventricular diastolic  parameters are consistent with Grade II diastolic dysfunction  (pseudonormalization). Elevated left ventricular end-diastolic pressure.  There is hypokinesis of the left ventricular,  entire anterior wall and lateral wall.   2. Right ventricular systolic function is normal. The right ventricular  size is normal. Tricuspid regurgitation signal is inadequate for assessing  PA pressure.   3. Left atrial size was mild to moderately dilated.   4. Right atrial size was mildly dilated.   5. The mitral valve is normal in structure.  Trivial mitral valve  regurgitation. No evidence of mitral stenosis.   6. The aortic valve is tricuspid. There is moderate calcification of the  aortic valve. There is moderate thickening of the aortic valve. Aortic  valve regurgitation is not visualized. Mild aortic valve stenosis.   7. Aortic dilatation noted. There is mild dilatation of the ascending  aorta, measuring 41 mm.   8. The inferior vena cava is normal in size with greater than 50%  respiratory variability, suggesting right atrial pressure of 3 mmHg.  EKG: Independently reviewed.  Vent. rate 85 BPM PR interval 216 ms QRS duration 102 ms QT/QTcB 385/458 ms P-R-T axes 68 85 67 Sinus rhythm Prolonged PR interval Borderline right axis deviation Low voltage, precordial leads Baseline wander in lead(s) V6  Assessment/Plan Principal Problem:   Acute coronary syndrome (HCC) Observation/PCU at [facility]. Supplemental oxygen as needed. ASA (took about 10 Alka-Seltzer's) Nitroglycerin as needed for chest pain. Continue heparin infusion. Continue Toprol-XL 50 mg p.o. daily. Continue with statin and Entresto. Serial EKGs. Trend troponin level. Cardiology is aware.  Active Problems:   Hyperlipidemia Continue atorvastatin 40 mg p.o. daily.    Gout Continue allopurinol. Colchicine as needed.    Essential hypertension Continue metoprolol succinate 50 mg daily. Also on Entresto 49-51 mg p.o. twice daily. Monitor BP, heart rate, renal function electrolytes.    OSA (obstructive sleep apnea) Continue CPAP at bedtime.    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 Continue heparin infusion    Atrial fibrillation/typical atrial flutter ([place]) Status post ablation in November last year. CHA?DS?-VASc Score of at least 3    Class 2 obesity Has been losing weight after lap band. Continue lifestyle modifications.    DVT prophylaxis: On heparin infusion. Code Status:   Full code. Family Communication:    Disposition Plan:   Patient is from:  Home.  Anticipated DC to:  Home.  Anticipated DC date:  10/02/2020.  Anticipated DC barriers: Clinical status and consultants sign off. Consults called:  Cardiology will be following at [facility]. Admission status:  Observation/PCU.  Severity of Illness:  High severity in the setting of acute coronary syndrome.  The patient will need to be  admitted for further evaluation and possibly intervention by cardiology.  Reubin Milan MD [facility]  How to contact the [facility] Attending or Consulting provider [place] or covering provider during after hours [place], for this patient?   Check the care team in [facility] and look for a) attending/consulting [facility] provider listed and b) the [facility] team listed Log into [email or web address] and use [place] universal password to access. If you do not have the password, please contact the hospital operator. Locate the [facility] provider you are looking for under [facility] and page to a number that you can be directly reached. If you still have difficulty reaching the provider, please page the [facility] (Director on Call) for the Hospitalists listed on amion for assistance.  09/30/2020, 7:56 AM   This document was prepared using Dragon voice recognition software and may contain some unintended transcription errors.

## 2020-09-30 NOTE — Progress Notes (Signed)
09/30/2020  Cardio moved cath date to 6/14.  Pt continues to have "indigestion" complaints, given additional GI cocktail and protonix.  Cardio aware of troponin trend.  Repeated EKGs reviewed.  Still awaiting transfer to [facility].  Seen in ED.  Discussed with cardio team will keep on [facility] service due to other medical comorbidities.   Murvin Natal, MD How to contact the [facility] Attending or Consulting provider [place] or covering provider during after hours [place], for this patient?  Check the care team in [facility] and look for a) attending/consulting TRH provider listed and b) the [facility] team listed Log into [email or web address] and use [place]'s universal password to access. If you do not have the password, please contact the hospital operator. Locate the [facility] provider you are looking for under [facility] and page to a number that you can be directly reached. If you still have difficulty reaching the provider, please page the [facility] (Director on Call) for the Hospitalists listed on amion for assistance.

## 2020-10-01 ENCOUNTER — Encounter: Payer: Self-pay | Admitting: Adult Health

## 2020-10-01 ENCOUNTER — Encounter (HOSPITAL_COMMUNITY)
Admission: EM | Disposition: A | Discharge status: Home Health | Payer: Self-pay | Source: Home / Self Care | Attending: Cardiothoracic Surgery

## 2020-10-01 ENCOUNTER — Inpatient Hospital Stay (HOSPITAL_COMMUNITY): Payer: BC Managed Care – PPO

## 2020-10-01 DIAGNOSIS — E782 Mixed hyperlipidemia: Secondary | ICD-10-CM

## 2020-10-01 DIAGNOSIS — I2510 Atherosclerotic heart disease of native coronary artery without angina pectoris: Secondary | ICD-10-CM

## 2020-10-01 DIAGNOSIS — I214 Non-ST elevation (NSTEMI) myocardial infarction: Secondary | ICD-10-CM

## 2020-10-01 DIAGNOSIS — I10 Essential (primary) hypertension: Secondary | ICD-10-CM

## 2020-10-01 DIAGNOSIS — I483 Typical atrial flutter: Secondary | ICD-10-CM

## 2020-10-01 HISTORY — PX: LEFT HEART CATH AND CORONARY ANGIOGRAPHY: CATH118249

## 2020-10-01 LAB — BASIC METABOLIC PANEL
Anion gap: 8 (ref 5–15)
BUN: 20 mg/dL (ref 6–20)
CO2: 24 mmol/L (ref 22–32)
Calcium: 8.9 mg/dL (ref 8.9–10.3)
Chloride: 103 mmol/L (ref 98–111)
Creatinine, Ser: 1.5 mg/dL — ABNORMAL HIGH (ref 0.61–1.24)
GFR, Estimated: 53 mL/min — ABNORMAL LOW (ref >60)
Glucose, Bld: 101 mg/dL — ABNORMAL HIGH (ref 70–99)
Potassium: 4.8 mmol/L (ref 3.5–5.1)
Sodium: 135 mmol/L (ref 135–145)

## 2020-10-01 LAB — CBC
HCT: 35.5 % — ABNORMAL LOW (ref 39.0–52.0)
Hemoglobin: 11.3 g/dL — ABNORMAL LOW (ref 13.0–17.0)
MCH: 31.9 pg (ref 26.0–34.0)
MCHC: 31.8 g/dL (ref 30.0–36.0)
MCV: 100.3 fL — ABNORMAL HIGH (ref 80.0–100.0)
Platelets: 228 10*3/uL (ref 150–400)
RBC: 3.54 MIL/uL — ABNORMAL LOW (ref 4.22–5.81)
RDW: 13.7 % (ref 11.5–15.5)
WBC: 8.9 10*3/uL (ref 4.0–10.5)
nRBC: 0 % (ref 0.0–0.2)

## 2020-10-01 LAB — HIV ANTIBODY (ROUTINE TESTING W REFLEX): HIV Screen 4th Generation wRfx: NONREACTIVE

## 2020-10-01 LAB — APTT: aPTT: 51 seconds — ABNORMAL HIGH (ref 24–36)

## 2020-10-01 LAB — HEPARIN LEVEL (UNFRACTIONATED): Heparin Unfractionated: 0.7 IU/mL (ref 0.30–0.70)

## 2020-10-01 SURGERY — LEFT HEART CATH AND CORONARY ANGIOGRAPHY
Anesthesia: LOCAL

## 2020-10-01 MED ORDER — IOHEXOL 350 MG/ML SOLN
INTRAVENOUS | Status: DC | PRN
  Administered 2020-10-01: 72 mL via INTRA_ARTERIAL

## 2020-10-01 MED ORDER — MIDAZOLAM HCL 2 MG/2ML IJ SOLN
INTRAMUSCULAR | Status: DC | PRN
  Administered 2020-10-01: 2 mg via INTRAVENOUS

## 2020-10-01 MED ORDER — FENTANYL CITRATE (PF) 100 MCG/2ML IJ SOLN
INTRAMUSCULAR | Status: DC | PRN
  Administered 2020-10-01: 25 ug via INTRAVENOUS

## 2020-10-01 MED ORDER — HYDRALAZINE HCL 20 MG/ML IJ SOLN: 10.0000 mg | INTRAMUSCULAR | Status: AC | PRN

## 2020-10-01 MED ORDER — SODIUM CHLORIDE 0.9 % IV SOLN: INTRAVENOUS | Status: AC

## 2020-10-01 MED ORDER — VERAPAMIL HCL 2.5 MG/ML IV SOLN
INTRAVENOUS | Status: DC | PRN
  Administered 2020-10-01: 10 mL via INTRA_ARTERIAL

## 2020-10-01 MED ORDER — ACETAMINOPHEN 325 MG PO TABS: 650.0000 mg | ORAL_TABLET | ORAL | Status: DC | PRN

## 2020-10-01 MED ORDER — FENTANYL CITRATE (PF) 100 MCG/2ML IJ SOLN
INTRAMUSCULAR | Status: DC | PRN
  Administered 2020-10-01: 50 ug via INTRAVENOUS

## 2020-10-01 MED ORDER — MIDAZOLAM HCL 2 MG/2ML IJ SOLN
INTRAMUSCULAR | Status: DC | PRN
  Administered 2020-10-01: 1 mg via INTRAVENOUS

## 2020-10-01 MED ORDER — HEPARIN SODIUM (PORCINE) 1000 UNIT/ML IJ SOLN
INTRAMUSCULAR | Status: DC | PRN
  Administered 2020-10-01: 7000 [IU] via INTRAVENOUS

## 2020-10-01 MED ORDER — HEPARIN SODIUM (PORCINE) 1000 UNIT/ML IJ SOLN
INTRAMUSCULAR | Status: AC
  Filled 2020-10-01: qty 1

## 2020-10-01 MED ORDER — ASPIRIN 81 MG PO CHEW
81.0000 mg | CHEWABLE_TABLET | Freq: Every day | ORAL | Status: DC
  Administered 2020-10-02: 81 mg via ORAL
  Filled 2020-10-01: qty 1

## 2020-10-01 MED ORDER — SODIUM CHLORIDE 0.9% FLUSH: 3.0000 mL | INTRAVENOUS | Status: DC | PRN

## 2020-10-01 MED ORDER — DIAZEPAM 5 MG PO TABS: 5.0000 mg | ORAL_TABLET | ORAL | Status: DC | PRN

## 2020-10-01 MED ORDER — FENTANYL CITRATE (PF) 100 MCG/2ML IJ SOLN
INTRAMUSCULAR | Status: AC
  Filled 2020-10-01: qty 2

## 2020-10-01 MED ORDER — SODIUM CHLORIDE 0.9 % IV SOLN: 250.0000 mL | INTRAVENOUS | Status: DC | PRN

## 2020-10-01 MED ORDER — LABETALOL HCL 5 MG/ML IV SOLN: 10.0000 mg | INTRAVENOUS | Status: AC | PRN

## 2020-10-01 MED ORDER — LIDOCAINE HCL (PF) 1 % IJ SOLN
INTRAMUSCULAR | Status: DC | PRN
  Administered 2020-10-01: 2 mL

## 2020-10-01 MED ORDER — MIDAZOLAM HCL 2 MG/2ML IJ SOLN
INTRAMUSCULAR | Status: AC
  Filled 2020-10-01: qty 2

## 2020-10-01 MED ORDER — VERAPAMIL HCL 2.5 MG/ML IV SOLN
INTRAVENOUS | Status: AC
  Filled 2020-10-01: qty 2

## 2020-10-01 MED ORDER — HEPARIN (PORCINE) IN NACL 1000-0.9 UT/500ML-% IV SOLN
INTRAVENOUS | Status: AC
  Filled 2020-10-01: qty 500

## 2020-10-01 MED ORDER — LIDOCAINE HCL (PF) 1 % IJ SOLN
INTRAMUSCULAR | Status: AC
  Filled 2020-10-01: qty 60

## 2020-10-01 MED ORDER — ATORVASTATIN CALCIUM 80 MG PO TABS
80.0000 mg | ORAL_TABLET | Freq: Every day | ORAL | Status: DC
  Administered 2020-10-02 – 2020-11-05 (×33): 80 mg via ORAL
  Filled 2020-10-01 (×33): qty 1

## 2020-10-01 MED ORDER — SODIUM CHLORIDE 0.9% FLUSH
3.0000 mL | Freq: Two times a day (BID) | INTRAVENOUS | Status: DC
  Administered 2020-10-02 (×2): 3 mL via INTRAVENOUS

## 2020-10-01 MED ORDER — HEPARIN (PORCINE) IN NACL 1000-0.9 UT/500ML-% IV SOLN
INTRAVENOUS | Status: DC | PRN
  Administered 2020-10-01 (×2): 500 mL

## 2020-10-01 MED ORDER — SODIUM CHLORIDE 0.9 % IV SOLN: INTRAVENOUS | Status: DC

## 2020-10-01 MED ORDER — ONDANSETRON HCL 4 MG/2ML IJ SOLN: 4.0000 mg | Freq: Four times a day (QID) | INTRAMUSCULAR | Status: DC | PRN

## 2020-10-01 MED ORDER — HEPARIN (PORCINE) 25000 UT/250ML-% IV SOLN
1700.0000 [IU]/h | INTRAVENOUS | Status: DC
  Administered 2020-10-01 – 2020-10-02 (×2): 1700 [IU]/h via INTRAVENOUS
  Filled 2020-10-01: qty 250

## 2020-10-01 MED ORDER — HEPARIN (PORCINE) IN NACL 1000-0.9 UT/500ML-% IV SOLN
INTRAVENOUS | Status: AC
  Filled 2020-10-01: qty 1000

## 2020-10-01 SURGICAL SUPPLY — 11 items
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
## 2020-10-01 NOTE — Progress Notes (Signed)
Patient requests his brother, Cristie Hem at [phone] to be called with updates after procedure.

## 2020-10-01 NOTE — Progress Notes (Signed)
[place] for heparin Indication:  ACS with h/o DVT and Aflutter  Allergies  Allergen Reactions   Levaquin [Levofloxacin]     Aortic Aneurysm    Patient Measurements: Height: 6\' 3"  (190.5 cm) Weight: 129.7 kg (286 lb) IBW/kg (Calculated) : 84.5 Heparin Dosing Weight: 115kg  Vital Signs: Temp: 98.2 F (36.8 C) (06/14 0800) Temp Source: Oral (06/14 0800) BP: 93/47 (06/14 0800) Pulse Rate: 73 (06/14 0800)  Labs: Recent Labs    09/29/20 2245 09/30/20 0113 09/30/20 0508 09/30/20 1321 10/01/20 0623  HGB 11.9*  --   --   --  11.3*  HCT 36.7*  --   --   --  35.5*  PLT 253  --   --   --  228  APTT  --   --  45*  --  51*  HEPARINUNFRC  --   --  >1.10*  --  0.70  CREATININE 1.22  --   --   --  1.50*  TROPONINIHS 29* 862* 3,620* 12,041*  --      Estimated Creatinine Clearance: 76 mL/min (A) (by C-G formula based on SCr of 1.5 mg/dL (H)).   Medical History: Past Medical History:  Diagnosis Date   Blood in urine    Class 2 obesity 09/30/2020   DDD (degenerative disc disease)    DVT (deep venous thrombosis) ([place]) 2017 & 2018   Gout    Heart murmur    Hyperlipidemia    Hypertension    Numbness and tingling    right arm and hand   SCC (squamous cell carcinoma)    skin, squamous cell, face    Assessment: 60yo male w/ CP and rising troponins. Pt on Xarelto PTA for hx DVT and AFL, transitioned to heparin.   Heparin level falsely elevated by DOAC, aPTT is low, CBC stable. Catheterization planned today.  Goal of Therapy:  Heparin level 0.3-0.7 units/ml aPTT 66-102 seconds Monitor platelets by anticoagulation protocol: Yes   Plan:  Increase heparin to 1700 units/h Follow-up after cath   Arrie Senate, PharmD, BCPS, [facility] Clinical Pharmacist [phone] Please check AMION for all [facility] Pharmacy numbers 10/01/2020

## 2020-10-01 NOTE — Progress Notes (Signed)
Matthew Savage for heparin Indication:  ACS with h/o DVT and Aflutter  Allergies  Allergen Reactions   Levaquin [Levofloxacin]     Aortic Aneurysm    Patient Measurements: Height: 6\' 3"  (190.5 cm) Weight: 129.7 kg (286 lb) IBW/kg (Calculated) : 84.5 Heparin Dosing Weight: 115kg  Vital Signs: Temp: 98.4 F (36.9 C) (06/14 1612) Temp Source: Oral (06/14 1612) BP: 122/73 (06/14 1612) Pulse Rate: 75 (06/14 1612)  Labs: Recent Labs    09/29/20 2245 09/30/20 0113 09/30/20 0508 09/30/20 1321 10/01/20 0623  HGB 11.9*  --   --   --  11.3*  HCT 36.7*  --   --   --  35.5*  PLT 253  --   --   --  228  APTT  --   --  45*  --  51*  HEPARINUNFRC  --   --  >1.10*  --  0.70  CREATININE 1.22  --   --   --  1.50*  TROPONINIHS 29* 862* 3,620* 12,041*  --     Estimated Creatinine Clearance: 76 mL/min (A) (by C-G formula based on SCr of 1.5 mg/dL (H)).   Medical History: Past Medical History:  Diagnosis Date   Blood in urine    Class 2 obesity 09/30/2020   DDD (degenerative disc disease)    DVT (deep venous thrombosis) ([place]) 2017 & 2018   Gout    Heart murmur    Hyperlipidemia    Hypertension    Numbness and tingling    right arm and hand   SCC (squamous cell carcinoma)    skin, squamous cell, face    Assessment: 60yo male admitted with chest pain on Xarelto for history of DVT and atrial flutter. Xarelto was transitioned to IV Heparin. Patient is now post cardiac catheterization with findings of severe multivessel disease and pharmacy has been consulted to resume IV heparin 8 hours post sheath pull. TCTS consult for CABG pending.   Sheath was pulled at 15:30 PM. TR band placed- now down to zero air without issues.   Goal of Therapy:  Heparin level 0.3-0.7 units/ml aPTT 66-102 seconds Monitor platelets by anticoagulation protocol: Yes   Plan:  Restart IV Heparin at 1700 units/hr at 2330 PM.  aPTT and Heparin level  6 hours after  restart.  Daily aPTT, Heparin level, and CBC while on therapy.    Sloan Leiter, PharmD, BCPS, BCCCP Clinical Pharmacist Please refer to [facility] for [facility] Pharmacy numbers 10/01/2020

## 2020-10-01 NOTE — Interval H&P Note (Signed)
Cath Lab Visit (complete for each Cath Lab visit)  Clinical Evaluation Leading to the Procedure:   ACS: Yes.    Non-ACS:    Anginal Classification: CCS III  Anti-ischemic medical therapy: Minimal Therapy (1 class of medications)  Non-Invasive Test Results: No non-invasive testing performed  Prior CABG: No previous CABG      History and Physical Interval Note:  10/01/2020 2:38 PM  Matthew Savage  has presented today for surgery, with the diagnosis of chest pain.  The various methods of treatment have been discussed with the patient and family. After consideration of risks, benefits and other options for treatment, the patient has consented to  Procedure(s): LEFT HEART CATH AND CORONARY ANGIOGRAPHY (N/A) as a surgical intervention.  The patient's history has been reviewed, patient examined, no change in status, stable for surgery.  I have reviewed the patient's chart and labs.  Questions were answered to the patient's satisfaction.     Shelva Majestic
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
## 2020-10-01 NOTE — Progress Notes (Signed)
   10/01/20 1130  Mobility  Activity Ambulated in hall;Dangled on edge of bed  Range of Motion/Exercises Active;All extremities  Level of Assistance Independent after set-up  Assistive Device None  Minutes Stood 3 minutes  Minutes Ambulated 3 minutes  Distance Ambulated (ft) 360 ft  Mobility Ambulated independently in hallway  Mobility Response Tolerated well  Mobility performed by Mobility specialist  Bed Position Semi-fowlers  $Mobility charge 1 Mobility   Patient is independent. Tolerated well without complaint or incident

## 2020-10-01 NOTE — Progress Notes (Signed)
TCTS consulted for CABG evaluation. °

## 2020-10-02 ENCOUNTER — Inpatient Hospital Stay (HOSPITAL_COMMUNITY): Payer: BC Managed Care – PPO

## 2020-10-02 ENCOUNTER — Encounter (HOSPITAL_COMMUNITY): Payer: Self-pay | Admitting: Cardiovascular Disease

## 2020-10-02 ENCOUNTER — Other Ambulatory Visit (HOSPITAL_COMMUNITY): Payer: Self-pay | Admitting: Respiratory Therapy

## 2020-10-02 ENCOUNTER — Telehealth: Payer: Self-pay | Admitting: Internal Medicine

## 2020-10-02 DIAGNOSIS — Z87891 Personal history of nicotine dependence: Secondary | ICD-10-CM

## 2020-10-02 DIAGNOSIS — I10 Essential (primary) hypertension: Secondary | ICD-10-CM

## 2020-10-02 DIAGNOSIS — I350 Nonrheumatic aortic (valve) stenosis: Secondary | ICD-10-CM

## 2020-10-02 DIAGNOSIS — E785 Hyperlipidemia, unspecified: Secondary | ICD-10-CM

## 2020-10-02 DIAGNOSIS — G9389 Other specified disorders of brain: Secondary | ICD-10-CM

## 2020-10-02 DIAGNOSIS — Z86718 Personal history of other venous thrombosis and embolism: Secondary | ICD-10-CM

## 2020-10-02 DIAGNOSIS — Z01810 Encounter for preprocedural cardiovascular examination: Secondary | ICD-10-CM

## 2020-10-02 DIAGNOSIS — I2510 Atherosclerotic heart disease of native coronary artery without angina pectoris: Secondary | ICD-10-CM

## 2020-10-02 DIAGNOSIS — I25110 Atherosclerotic heart disease of native coronary artery with unstable angina pectoris: Secondary | ICD-10-CM

## 2020-10-02 DIAGNOSIS — I214 Non-ST elevation (NSTEMI) myocardial infarction: Principal | ICD-10-CM

## 2020-10-02 DIAGNOSIS — I4892 Unspecified atrial flutter: Secondary | ICD-10-CM

## 2020-10-02 LAB — BASIC METABOLIC PANEL
Anion gap: 11 (ref 5–15)
BUN: 20 mg/dL (ref 6–20)
CO2: 22 mmol/L (ref 22–32)
Calcium: 8.9 mg/dL (ref 8.9–10.3)
Chloride: 103 mmol/L (ref 98–111)
Creatinine, Ser: 1.22 mg/dL (ref 0.61–1.24)
GFR, Estimated: >60 mL/min (ref >60)
Glucose, Bld: 89 mg/dL (ref 70–99)
Potassium: 4.6 mmol/L (ref 3.5–5.1)
Sodium: 136 mmol/L (ref 135–145)

## 2020-10-02 LAB — ECHOCARDIOGRAM COMPLETE
AR max vel: 1.91 cm2
AV Area VTI: 1.96 cm2
AV Area mean vel: 2.17 cm2
AV Mean grad: 29 mmHg
AV Peak grad: 43.3 mmHg
Ao pk vel: 3.29 m/s
Area-P 1/2: 3.87 cm2
Calc EF: 54.1 %
Height: 75 in
S' Lateral: 4.3 cm
Single Plane A2C EF: 51.3 %
Single Plane A4C EF: 56.3 %
Weight: 4631.42 oz

## 2020-10-02 LAB — CBC
HCT: 33.9 % — ABNORMAL LOW (ref 39.0–52.0)
Hemoglobin: 10.9 g/dL — ABNORMAL LOW (ref 13.0–17.0)
MCH: 31.9 pg (ref 26.0–34.0)
MCHC: 32.2 g/dL (ref 30.0–36.0)
MCV: 99.1 fL (ref 80.0–100.0)
Platelets: 222 10*3/uL (ref 150–400)
RBC: 3.42 MIL/uL — ABNORMAL LOW (ref 4.22–5.81)
RDW: 13.9 % (ref 11.5–15.5)
WBC: 7.2 10*3/uL (ref 4.0–10.5)
nRBC: 0 % (ref 0.0–0.2)

## 2020-10-02 LAB — PULMONARY FUNCTION TEST
FEF 25-75 Pre: 1.26 L/sec
FEF2575-%Pred-Pre: 41 %
FEV1-%Pred-Pre: 54 %
FEV1-Pre: 2.04 L
FEV1FVC-%Pred-Pre: 84 %
FEV6-%Pred-Pre: 65 %
FEV6-Pre: 3.1 L
FEV6FVC-%Pred-Pre: 102 %
FVC-%Pred-Pre: 64 %
FVC-Pre: 3.19 L
Pre FEV1/FVC ratio: 64 %
Pre FEV6/FVC Ratio: 98 %

## 2020-10-02 LAB — TYPE AND SCREEN
ABO/RH(D): O POS
Antibody Screen: NEGATIVE

## 2020-10-02 LAB — HEPARIN LEVEL (UNFRACTIONATED): Heparin Unfractionated: 0.33 IU/mL (ref 0.30–0.70)

## 2020-10-02 LAB — ABO/RH: ABO/RH(D): O POS

## 2020-10-02 LAB — APTT: aPTT: 65 seconds — ABNORMAL HIGH (ref 24–36)

## 2020-10-02 MED ORDER — TRANEXAMIC ACID (OHS) PUMP PRIME SOLUTION
2.0000 mg/kg | INTRAVENOUS | Status: DC
  Filled 2020-10-02: qty 2.63

## 2020-10-02 MED ORDER — TEMAZEPAM 15 MG PO CAPS
15.0000 mg | ORAL_CAPSULE | Freq: Once | ORAL | Status: AC | PRN
  Administered 2020-10-02: 15 mg via ORAL
  Filled 2020-10-02: qty 1

## 2020-10-02 MED ORDER — INSULIN REGULAR(HUMAN) IN NACL 100-0.9 UT/100ML-% IV SOLN
INTRAVENOUS | Status: AC
  Administered 2020-10-03: .9 [IU]/h via INTRAVENOUS
  Filled 2020-10-02: qty 100

## 2020-10-02 MED ORDER — PHENYLEPHRINE HCL-NACL 20-0.9 MG/250ML-% IV SOLN
30.0000 ug/min | INTRAVENOUS | Status: AC
  Administered 2020-10-03: 40 ug/min via INTRAVENOUS
  Filled 2020-10-02: qty 250

## 2020-10-02 MED ORDER — BISACODYL 5 MG PO TBEC
5.0000 mg | DELAYED_RELEASE_TABLET | Freq: Once | ORAL | Status: AC
  Administered 2020-10-02: 5 mg via ORAL
  Filled 2020-10-02: qty 1

## 2020-10-02 MED ORDER — SODIUM CHLORIDE 0.9 % IV SOLN
INTRAVENOUS | Status: DC
  Filled 2020-10-02: qty 30

## 2020-10-02 MED ORDER — TRANEXAMIC ACID (OHS) BOLUS VIA INFUSION
15.0000 mg/kg | INTRAVENOUS | Status: AC
  Administered 2020-10-03: 1969.5 mg via INTRAVENOUS
  Filled 2020-10-02: qty 1970

## 2020-10-02 MED ORDER — CEFAZOLIN IN SODIUM CHLORIDE 3-0.9 GM/100ML-% IV SOLN
3.0000 g | INTRAVENOUS | Status: AC
  Administered 2020-10-03: 3 g via INTRAVENOUS
  Filled 2020-10-02 (×2): qty 100

## 2020-10-02 MED ORDER — EPINEPHRINE HCL 5 MG/250ML IV SOLN IN NS
0.0000 ug/min | INTRAVENOUS | Status: DC
  Filled 2020-10-02: qty 250

## 2020-10-02 MED ORDER — CHLORHEXIDINE GLUCONATE CLOTH 2 % EX PADS
6.0000 | MEDICATED_PAD | Freq: Once | CUTANEOUS | Status: AC
  Administered 2020-10-02: 6 via TOPICAL

## 2020-10-02 MED ORDER — TRANEXAMIC ACID 1000 MG/10ML IV SOLN
1.5000 mg/kg/h | INTRAVENOUS | Status: AC
  Administered 2020-10-03 (×2): 1.5 mg/kg/h via INTRAVENOUS
  Filled 2020-10-02: qty 25

## 2020-10-02 MED ORDER — CHLORHEXIDINE GLUCONATE 0.12 % MT SOLN
15.0000 mL | Freq: Once | OROMUCOSAL | Status: AC
  Administered 2020-10-03: 15 mL via OROMUCOSAL
  Filled 2020-10-02: qty 15

## 2020-10-02 MED ORDER — MAGNESIUM SULFATE 50 % IJ SOLN
40.0000 meq | INTRAMUSCULAR | Status: DC
  Filled 2020-10-02: qty 9.85

## 2020-10-02 MED ORDER — PLASMA-LYTE A IV SOLN
INTRAVENOUS | Status: AC
  Administered 2020-10-03: 1000 mL
  Filled 2020-10-02: qty 5

## 2020-10-02 MED ORDER — DEXMEDETOMIDINE HCL IN NACL 400 MCG/100ML IV SOLN
0.1000 ug/kg/h | INTRAVENOUS | Status: AC
  Administered 2020-10-03: .7 ug/kg/h via INTRAVENOUS
  Filled 2020-10-02: qty 100

## 2020-10-02 MED ORDER — MILRINONE LACTATE IN DEXTROSE 20-5 MG/100ML-% IV SOLN
0.3000 ug/kg/min | INTRAVENOUS | Status: DC
  Filled 2020-10-02: qty 100

## 2020-10-02 MED ORDER — NOREPINEPHRINE 4 MG/250ML-% IV SOLN
0.0000 ug/min | INTRAVENOUS | Status: DC
Start: 2020-10-03 — End: 2020-10-03
  Filled 2020-10-02: qty 250

## 2020-10-02 MED ORDER — METOPROLOL TARTRATE 12.5 MG HALF TABLET
12.5000 mg | ORAL_TABLET | Freq: Once | ORAL | Status: AC
  Administered 2020-10-03: 12.5 mg via ORAL
  Filled 2020-10-02: qty 1

## 2020-10-02 MED ORDER — CEFAZOLIN SODIUM-DEXTROSE 2-4 GM/100ML-% IV SOLN
2.0000 g | INTRAVENOUS | Status: DC
  Filled 2020-10-02: qty 100

## 2020-10-02 MED ORDER — POTASSIUM CHLORIDE 2 MEQ/ML IV SOLN
80.0000 meq | INTRAVENOUS | Status: DC
  Filled 2020-10-02: qty 40

## 2020-10-02 MED ORDER — CHLORHEXIDINE GLUCONATE CLOTH 2 % EX PADS
6.0000 | MEDICATED_PAD | Freq: Once | CUTANEOUS | Status: AC
  Administered 2020-10-03: 6 via TOPICAL

## 2020-10-02 MED ORDER — NITROGLYCERIN IN D5W 200-5 MCG/ML-% IV SOLN
2.0000 ug/min | INTRAVENOUS | Status: AC
  Administered 2020-10-03: 30 ug/min via INTRAVENOUS
  Filled 2020-10-02: qty 250

## 2020-10-02 MED ORDER — VANCOMYCIN HCL 1500 MG/300ML IV SOLN
1500.0000 mg | INTRAVENOUS | Status: AC
  Administered 2020-10-03: 1500 mg via INTRAVENOUS
  Filled 2020-10-02 (×2): qty 300

## 2020-10-02 NOTE — Progress Notes (Signed)
Pre-CABG testing has been completed. Preliminary results can be found in CV Proc through chart review.   10/02/20 3:13 PM Matthew Savage RVT
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
## 2020-10-02 NOTE — Progress Notes (Signed)
  Echocardiogram 2D Echocardiogram has been performed.  Matthew Savage 10/02/2020, 3:42 PM

## 2020-10-02 NOTE — Progress Notes (Signed)
[place] for heparin Indication:  ACS with h/o DVT and Aflutter  Allergies  Allergen Reactions   Levaquin [Levofloxacin]     Aortic Aneurysm    Patient Measurements: Height: 6\' 3"  (190.5 cm) Weight: 131.3 kg (289 lb 7.4 oz) IBW/kg (Calculated) : 84.5 Heparin Dosing Weight: 115kg  Vital Signs: Temp: 98.1 F (36.7 C) (06/15 0738) Temp Source: Oral (06/15 0738) BP: 132/72 (06/15 0738) Pulse Rate: 69 (06/15 0738)  Labs: Recent Labs    09/29/20 2245 09/30/20 0113 09/30/20 0508 09/30/20 1321 10/01/20 0623 10/02/20 0326 10/02/20 0640  HGB 11.9*  --   --   --  11.3*  --  10.9*  HCT 36.7*  --   --   --  35.5*  --  33.9*  PLT 253  --   --   --  228  --  222  APTT  --   --  45*  --  51*  --  65*  HEPARINUNFRC  --   --  >1.10*  --  0.70  --  0.33  CREATININE 1.22  --   --   --  1.50* 1.22  --   TROPONINIHS 29* 862* 3,620* 12,041*  --   --   --      Estimated Creatinine Clearance: 94 mL/min (by C-G formula based on SCr of 1.22 mg/dL).   Medical History: Past Medical History:  Diagnosis Date   Blood in urine    Class 2 obesity 09/30/2020   DDD (degenerative disc disease)    DVT (deep venous thrombosis) ([place]) 2017 & 2018   Gout    Heart murmur    Hyperlipidemia    Hypertension    Numbness and tingling    right arm and hand   SCC (squamous cell carcinoma)    skin, squamous cell, face    Assessment: 60yo male admitted with chest pain on Xarelto for history of DVT and atrial flutter. Xarelto was transitioned to IV Heparin. Patient is now post cardiac catheterization with findings of severe multivessel disease and pharmacy has been consulted to resume IV heparin 8 hours post sheath pull. TCTS consult for CABG pending.   Heparin level within goal range this morning.  No overt bleeding or complications noted.  Goal of Therapy:  Heparin level 0.3-0.7 units/ml aPTT 66-102 seconds Monitor platelets by anticoagulation protocol:  Yes   Plan:  Continue IV heparin at current rate of 1700 units/hr. Daily heparin level and CBC. F/u plans/timing of CABG.  [place] Crane, Roylene Reason, [facility] Clinical Pharmacist  10/02/2020 11:41 AM   [facility] pharmacy phone numbers are listed on [email or web address]
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
## 2020-10-03 ENCOUNTER — Inpatient Hospital Stay (HOSPITAL_COMMUNITY)
Admission: EM | Disposition: A | Discharge status: Home Health | Payer: Self-pay | Source: Home / Self Care | Attending: Cardiothoracic Surgery

## 2020-10-03 ENCOUNTER — Inpatient Hospital Stay (HOSPITAL_COMMUNITY): Payer: BC Managed Care – PPO | Admitting: Anesthesiology

## 2020-10-03 ENCOUNTER — Inpatient Hospital Stay (HOSPITAL_COMMUNITY): Payer: BC Managed Care – PPO

## 2020-10-03 ENCOUNTER — Encounter (HOSPITAL_COMMUNITY): Payer: Self-pay | Admitting: Family Medicine

## 2020-10-03 DIAGNOSIS — I4891 Unspecified atrial fibrillation: Secondary | ICD-10-CM

## 2020-10-03 DIAGNOSIS — I350 Nonrheumatic aortic (valve) stenosis: Secondary | ICD-10-CM

## 2020-10-03 DIAGNOSIS — Q211 Atrial septal defect: Secondary | ICD-10-CM

## 2020-10-03 DIAGNOSIS — I2510 Atherosclerotic heart disease of native coronary artery without angina pectoris: Secondary | ICD-10-CM | POA: Diagnosis present

## 2020-10-03 HISTORY — PX: ASD REPAIR: SHX258

## 2020-10-03 HISTORY — PX: CORONARY ARTERY BYPASS GRAFT: SHX141

## 2020-10-03 HISTORY — PX: CLIPPING OF ATRIAL APPENDAGE: SHX5773

## 2020-10-03 HISTORY — PX: TEE WITHOUT CARDIOVERSION: SHX5443

## 2020-10-03 LAB — POCT I-STAT 7, (LYTES, BLD GAS, ICA,H+H)
Acid-Base Excess: 0 mmol/L (ref 0.0–2.0)
Acid-Base Excess: 1 mmol/L (ref 0.0–2.0)
Acid-Base Excess: 3 mmol/L — ABNORMAL HIGH (ref 0.0–2.0)
Acid-Base Excess: 3 mmol/L — ABNORMAL HIGH (ref 0.0–2.0)
Acid-Base Excess: 1 mmol/L (ref 0.0–2.0)
Acid-Base Excess: 2 mmol/L (ref 0.0–2.0)
Acid-Base Excess: 0 mmol/L (ref 0.0–2.0)
Acid-Base Excess: 1 mmol/L (ref 0.0–2.0)
Acid-base deficit: 1 mmol/L (ref 0.0–2.0)
Acid-base deficit: 3 mmol/L — ABNORMAL HIGH (ref 0.0–2.0)
Acid-base deficit: 4 mmol/L — ABNORMAL HIGH (ref 0.0–2.0)
Acid-base deficit: 5 mmol/L — ABNORMAL HIGH (ref 0.0–2.0)
Acid-base deficit: 6 mmol/L — ABNORMAL HIGH (ref 0.0–2.0)
Acid-base deficit: 6 mmol/L — ABNORMAL HIGH (ref 0.0–2.0)
Bicarbonate: 27.5 mmol/L (ref 20.0–28.0)
Bicarbonate: 27.4 mmol/L (ref 20.0–28.0)
Bicarbonate: 27.9 mmol/L (ref 20.0–28.0)
Bicarbonate: 28.1 mmol/L — ABNORMAL HIGH (ref 20.0–28.0)
Bicarbonate: 29.4 mmol/L — ABNORMAL HIGH (ref 20.0–28.0)
Bicarbonate: 26.8 mmol/L (ref 20.0–28.0)
Bicarbonate: 27.9 mmol/L (ref 20.0–28.0)
Bicarbonate: 25.2 mmol/L (ref 20.0–28.0)
Bicarbonate: 23.6 mmol/L (ref 20.0–28.0)
Bicarbonate: 25.4 mmol/L (ref 20.0–28.0)
Bicarbonate: 23.6 mmol/L (ref 20.0–28.0)
Bicarbonate: 21.4 mmol/L (ref 20.0–28.0)
Bicarbonate: 18.5 mmol/L — ABNORMAL LOW (ref 20.0–28.0)
Bicarbonate: 20.5 mmol/L (ref 20.0–28.0)
Calcium, Ion: 1.32 mmol/L (ref 1.15–1.40)
Calcium, Ion: 1.32 mmol/L (ref 1.15–1.40)
Calcium, Ion: 1.16 mmol/L (ref 1.15–1.40)
Calcium, Ion: 1.18 mmol/L (ref 1.15–1.40)
Calcium, Ion: 1.19 mmol/L (ref 1.15–1.40)
Calcium, Ion: 1.17 mmol/L (ref 1.15–1.40)
Calcium, Ion: 1.18 mmol/L (ref 1.15–1.40)
Calcium, Ion: 1.17 mmol/L (ref 1.15–1.40)
Calcium, Ion: 1.29 mmol/L (ref 1.15–1.40)
Calcium, Ion: 1.39 mmol/L (ref 1.15–1.40)
Calcium, Ion: 1.33 mmol/L (ref 1.15–1.40)
Calcium, Ion: 1.3 mmol/L (ref 1.15–1.40)
Calcium, Ion: 1.26 mmol/L (ref 1.15–1.40)
Calcium, Ion: 1.31 mmol/L (ref 1.15–1.40)
HCT: 32 % — ABNORMAL LOW (ref 39.0–52.0)
HCT: 32 % — ABNORMAL LOW (ref 39.0–52.0)
HCT: 25 % — ABNORMAL LOW (ref 39.0–52.0)
HCT: 26 % — ABNORMAL LOW (ref 39.0–52.0)
HCT: 27 % — ABNORMAL LOW (ref 39.0–52.0)
HCT: 26 % — ABNORMAL LOW (ref 39.0–52.0)
HCT: 26 % — ABNORMAL LOW (ref 39.0–52.0)
HCT: 27 % — ABNORMAL LOW (ref 39.0–52.0)
HCT: 25 % — ABNORMAL LOW (ref 39.0–52.0)
HCT: 26 % — ABNORMAL LOW (ref 39.0–52.0)
HCT: 33 % — ABNORMAL LOW (ref 39.0–52.0)
HCT: 31 % — ABNORMAL LOW (ref 39.0–52.0)
HCT: 28 % — ABNORMAL LOW (ref 39.0–52.0)
HCT: 33 % — ABNORMAL LOW (ref 39.0–52.0)
Hemoglobin: 10.9 g/dL — ABNORMAL LOW (ref 13.0–17.0)
Hemoglobin: 10.9 g/dL — ABNORMAL LOW (ref 13.0–17.0)
Hemoglobin: 8.5 g/dL — ABNORMAL LOW (ref 13.0–17.0)
Hemoglobin: 8.8 g/dL — ABNORMAL LOW (ref 13.0–17.0)
Hemoglobin: 9.2 g/dL — ABNORMAL LOW (ref 13.0–17.0)
Hemoglobin: 8.8 g/dL — ABNORMAL LOW (ref 13.0–17.0)
Hemoglobin: 8.8 g/dL — ABNORMAL LOW (ref 13.0–17.0)
Hemoglobin: 9.2 g/dL — ABNORMAL LOW (ref 13.0–17.0)
Hemoglobin: 8.5 g/dL — ABNORMAL LOW (ref 13.0–17.0)
Hemoglobin: 8.8 g/dL — ABNORMAL LOW (ref 13.0–17.0)
Hemoglobin: 11.2 g/dL — ABNORMAL LOW (ref 13.0–17.0)
Hemoglobin: 10.5 g/dL — ABNORMAL LOW (ref 13.0–17.0)
Hemoglobin: 9.5 g/dL — ABNORMAL LOW (ref 13.0–17.0)
Hemoglobin: 11.2 g/dL — ABNORMAL LOW (ref 13.0–17.0)
O2 Saturation: 100 %
O2 Saturation: 99 %
O2 Saturation: 100 %
O2 Saturation: 100 %
O2 Saturation: 80 %
O2 Saturation: 100 %
O2 Saturation: 100 %
O2 Saturation: 100 %
O2 Saturation: 100 %
O2 Saturation: 98 %
O2 Saturation: 98 %
O2 Saturation: 97 %
O2 Saturation: 98 %
O2 Saturation: 97 %
Patient temperature: 36
Patient temperature: 37
Patient temperature: 37
Patient temperature: 37
Patient temperature: 37
Patient temperature: 37
Patient temperature: 37
Patient temperature: 37
Patient temperature: 37
Patient temperature: 37
Patient temperature: 37.8
Patient temperature: 97
Patient temperature: 38
Potassium: 4.3 mmol/L (ref 3.5–5.1)
Potassium: 4.8 mmol/L (ref 3.5–5.1)
Potassium: 4.7 mmol/L (ref 3.5–5.1)
Potassium: 5.1 mmol/L (ref 3.5–5.1)
Potassium: 6.3 mmol/L (ref 3.5–5.1)
Potassium: 6.2 mmol/L — ABNORMAL HIGH (ref 3.5–5.1)
Potassium: 6.4 mmol/L (ref 3.5–5.1)
Potassium: 6.5 mmol/L (ref 3.5–5.1)
Potassium: 5.9 mmol/L — ABNORMAL HIGH (ref 3.5–5.1)
Potassium: 6.4 mmol/L (ref 3.5–5.1)
Potassium: 5.6 mmol/L — ABNORMAL HIGH (ref 3.5–5.1)
Potassium: 5.8 mmol/L — ABNORMAL HIGH (ref 3.5–5.1)
Potassium: 5.4 mmol/L — ABNORMAL HIGH (ref 3.5–5.1)
Potassium: 5.4 mmol/L — ABNORMAL HIGH (ref 3.5–5.1)
Sodium: 140 mmol/L (ref 135–145)
Sodium: 139 mmol/L (ref 135–145)
Sodium: 137 mmol/L (ref 135–145)
Sodium: 138 mmol/L (ref 135–145)
Sodium: 140 mmol/L (ref 135–145)
Sodium: 136 mmol/L (ref 135–145)
Sodium: 137 mmol/L (ref 135–145)
Sodium: 137 mmol/L (ref 135–145)
Sodium: 137 mmol/L (ref 135–145)
Sodium: 138 mmol/L (ref 135–145)
Sodium: 139 mmol/L (ref 135–145)
Sodium: 139 mmol/L (ref 135–145)
Sodium: 138 mmol/L (ref 135–145)
Sodium: 137 mmol/L (ref 135–145)
TCO2: 29 mmol/L (ref 22–32)
TCO2: 29 mmol/L (ref 22–32)
TCO2: 30 mmol/L (ref 22–32)
TCO2: 30 mmol/L (ref 22–32)
TCO2: 31 mmol/L (ref 22–32)
TCO2: 28 mmol/L (ref 22–32)
TCO2: 29 mmol/L (ref 22–32)
TCO2: 26 mmol/L (ref 22–32)
TCO2: 25 mmol/L (ref 22–32)
TCO2: 26 mmol/L (ref 22–32)
TCO2: 25 mmol/L (ref 22–32)
TCO2: 23 mmol/L (ref 22–32)
TCO2: 19 mmol/L — ABNORMAL LOW (ref 22–32)
TCO2: 22 mmol/L (ref 22–32)
pCO2 arterial: 57 mmHg — ABNORMAL HIGH (ref 32.0–48.0)
pCO2 arterial: 65.4 mmHg (ref 32.0–48.0)
pCO2 arterial: 47.5 mmHg (ref 32.0–48.0)
pCO2 arterial: 54.3 mmHg — ABNORMAL HIGH (ref 32.0–48.0)
pCO2 arterial: 55.2 mmHg — ABNORMAL HIGH (ref 32.0–48.0)
pCO2 arterial: 45.6 mmHg (ref 32.0–48.0)
pCO2 arterial: 51.6 mmHg — ABNORMAL HIGH (ref 32.0–48.0)
pCO2 arterial: 40.6 mmHg (ref 32.0–48.0)
pCO2 arterial: 36.5 mmHg (ref 32.0–48.0)
pCO2 arterial: 50.4 mmHg — ABNORMAL HIGH (ref 32.0–48.0)
pCO2 arterial: 51.3 mmHg — ABNORMAL HIGH (ref 32.0–48.0)
pCO2 arterial: 45.7 mmHg (ref 32.0–48.0)
pCO2 arterial: 31 mmHg — ABNORMAL LOW (ref 32.0–48.0)
pCO2 arterial: 44.1 mmHg (ref 32.0–48.0)
pH, Arterial: 7.291 — ABNORMAL LOW (ref 7.350–7.450)
pH, Arterial: 7.225 — ABNORMAL LOW (ref 7.350–7.450)
pH, Arterial: 7.312 — ABNORMAL LOW (ref 7.350–7.450)
pH, Arterial: 7.341 — ABNORMAL LOW (ref 7.350–7.450)
pH, Arterial: 7.381 (ref 7.350–7.450)
pH, Arterial: 7.34 — ABNORMAL LOW (ref 7.350–7.450)
pH, Arterial: 7.376 (ref 7.350–7.450)
pH, Arterial: 7.4 (ref 7.350–7.450)
pH, Arterial: 7.278 — ABNORMAL LOW (ref 7.350–7.450)
pH, Arterial: 7.45 (ref 7.350–7.450)
pH, Arterial: 7.271 — ABNORMAL LOW (ref 7.350–7.450)
pH, Arterial: 7.284 — ABNORMAL LOW (ref 7.350–7.450)
pH, Arterial: 7.379 (ref 7.350–7.450)
pH, Arterial: 7.28 — ABNORMAL LOW (ref 7.350–7.450)
pO2, Arterial: 256 mmHg — ABNORMAL HIGH (ref 83.0–108.0)
pO2, Arterial: 174 mmHg — ABNORMAL HIGH (ref 83.0–108.0)
pO2, Arterial: 337 mmHg — ABNORMAL HIGH (ref 83.0–108.0)
pO2, Arterial: 392 mmHg — ABNORMAL HIGH (ref 83.0–108.0)
pO2, Arterial: 50 mmHg — ABNORMAL LOW (ref 83.0–108.0)
pO2, Arterial: 282 mmHg — ABNORMAL HIGH (ref 83.0–108.0)
pO2, Arterial: 291 mmHg — ABNORMAL HIGH (ref 83.0–108.0)
pO2, Arterial: 227 mmHg — ABNORMAL HIGH (ref 83.0–108.0)
pO2, Arterial: 131 mmHg — ABNORMAL HIGH (ref 83.0–108.0)
pO2, Arterial: 496 mmHg — ABNORMAL HIGH (ref 83.0–108.0)
pO2, Arterial: 119 mmHg — ABNORMAL HIGH (ref 83.0–108.0)
pO2, Arterial: 100 mmHg (ref 83.0–108.0)
pO2, Arterial: 104 mmHg (ref 83.0–108.0)
pO2, Arterial: 103 mmHg (ref 83.0–108.0)

## 2020-10-03 LAB — POCT I-STAT, CHEM 8
BUN: 17 mg/dL (ref 6–20)
BUN: 17 mg/dL (ref 6–20)
BUN: 16 mg/dL (ref 6–20)
BUN: 17 mg/dL (ref 6–20)
BUN: 17 mg/dL (ref 6–20)
BUN: 18 mg/dL (ref 6–20)
BUN: 18 mg/dL (ref 6–20)
BUN: 19 mg/dL (ref 6–20)
BUN: 20 mg/dL (ref 6–20)
Calcium, Ion: 1.33 mmol/L (ref 1.15–1.40)
Calcium, Ion: 1.31 mmol/L (ref 1.15–1.40)
Calcium, Ion: 1.18 mmol/L (ref 1.15–1.40)
Calcium, Ion: 1.2 mmol/L (ref 1.15–1.40)
Calcium, Ion: 1.17 mmol/L (ref 1.15–1.40)
Calcium, Ion: 1.18 mmol/L (ref 1.15–1.40)
Calcium, Ion: 1.17 mmol/L (ref 1.15–1.40)
Calcium, Ion: 1.19 mmol/L (ref 1.15–1.40)
Calcium, Ion: 1.31 mmol/L (ref 1.15–1.40)
Chloride: 104 mmol/L (ref 98–111)
Chloride: 105 mmol/L (ref 98–111)
Chloride: 102 mmol/L (ref 98–111)
Chloride: 105 mmol/L (ref 98–111)
Chloride: 105 mmol/L (ref 98–111)
Chloride: 108 mmol/L (ref 98–111)
Chloride: 106 mmol/L (ref 98–111)
Chloride: 107 mmol/L (ref 98–111)
Chloride: 106 mmol/L (ref 98–111)
Creatinine, Ser: 1.1 mg/dL (ref 0.61–1.24)
Creatinine, Ser: 1.1 mg/dL (ref 0.61–1.24)
Creatinine, Ser: 1.1 mg/dL (ref 0.61–1.24)
Creatinine, Ser: 1.1 mg/dL (ref 0.61–1.24)
Creatinine, Ser: 1.1 mg/dL (ref 0.61–1.24)
Creatinine, Ser: 1.2 mg/dL (ref 0.61–1.24)
Creatinine, Ser: 1.2 mg/dL (ref 0.61–1.24)
Creatinine, Ser: 1.2 mg/dL (ref 0.61–1.24)
Creatinine, Ser: 1.1 mg/dL (ref 0.61–1.24)
Glucose, Bld: 105 mg/dL — ABNORMAL HIGH (ref 70–99)
Glucose, Bld: 113 mg/dL — ABNORMAL HIGH (ref 70–99)
Glucose, Bld: 105 mg/dL — ABNORMAL HIGH (ref 70–99)
Glucose, Bld: 125 mg/dL — ABNORMAL HIGH (ref 70–99)
Glucose, Bld: 152 mg/dL — ABNORMAL HIGH (ref 70–99)
Glucose, Bld: 167 mg/dL — ABNORMAL HIGH (ref 70–99)
Glucose, Bld: 175 mg/dL — ABNORMAL HIGH (ref 70–99)
Glucose, Bld: 176 mg/dL — ABNORMAL HIGH (ref 70–99)
Glucose, Bld: 174 mg/dL — ABNORMAL HIGH (ref 70–99)
HCT: 32 % — ABNORMAL LOW (ref 39.0–52.0)
HCT: 38 % — ABNORMAL LOW (ref 39.0–52.0)
HCT: 26 % — ABNORMAL LOW (ref 39.0–52.0)
HCT: 28 % — ABNORMAL LOW (ref 39.0–52.0)
HCT: 26 % — ABNORMAL LOW (ref 39.0–52.0)
HCT: 27 % — ABNORMAL LOW (ref 39.0–52.0)
HCT: 26 % — ABNORMAL LOW (ref 39.0–52.0)
HCT: 28 % — ABNORMAL LOW (ref 39.0–52.0)
HCT: 24 % — ABNORMAL LOW (ref 39.0–52.0)
Hemoglobin: 10.9 g/dL — ABNORMAL LOW (ref 13.0–17.0)
Hemoglobin: 12.9 g/dL — ABNORMAL LOW (ref 13.0–17.0)
Hemoglobin: 8.8 g/dL — ABNORMAL LOW (ref 13.0–17.0)
Hemoglobin: 9.5 g/dL — ABNORMAL LOW (ref 13.0–17.0)
Hemoglobin: 8.8 g/dL — ABNORMAL LOW (ref 13.0–17.0)
Hemoglobin: 9.2 g/dL — ABNORMAL LOW (ref 13.0–17.0)
Hemoglobin: 8.8 g/dL — ABNORMAL LOW (ref 13.0–17.0)
Hemoglobin: 9.5 g/dL — ABNORMAL LOW (ref 13.0–17.0)
Hemoglobin: 8.2 g/dL — ABNORMAL LOW (ref 13.0–17.0)
Potassium: 4.3 mmol/L (ref 3.5–5.1)
Potassium: 4.8 mmol/L (ref 3.5–5.1)
Potassium: 4.7 mmol/L (ref 3.5–5.1)
Potassium: 6.3 mmol/L (ref 3.5–5.1)
Potassium: 6.2 mmol/L — ABNORMAL HIGH (ref 3.5–5.1)
Potassium: 6.4 mmol/L (ref 3.5–5.1)
Potassium: 6.4 mmol/L (ref 3.5–5.1)
Potassium: 6.5 mmol/L (ref 3.5–5.1)
Potassium: 5.5 mmol/L — ABNORMAL HIGH (ref 3.5–5.1)
Sodium: 139 mmol/L (ref 135–145)
Sodium: 138 mmol/L (ref 135–145)
Sodium: 137 mmol/L (ref 135–145)
Sodium: 139 mmol/L (ref 135–145)
Sodium: 136 mmol/L (ref 135–145)
Sodium: 136 mmol/L (ref 135–145)
Sodium: 136 mmol/L (ref 135–145)
Sodium: 136 mmol/L (ref 135–145)
Sodium: 137 mmol/L (ref 135–145)
TCO2: 27 mmol/L (ref 22–32)
TCO2: 27 mmol/L (ref 22–32)
TCO2: 27 mmol/L (ref 22–32)
TCO2: 27 mmol/L (ref 22–32)
TCO2: 26 mmol/L (ref 22–32)
TCO2: 27 mmol/L (ref 22–32)
TCO2: 25 mmol/L (ref 22–32)
TCO2: 27 mmol/L (ref 22–32)
TCO2: 25 mmol/L (ref 22–32)

## 2020-10-03 LAB — BLOOD GAS, ARTERIAL
Acid-base deficit: 0.6 mmol/L (ref 0.0–2.0)
Bicarbonate: 23.7 mmol/L (ref 20.0–28.0)
Drawn by: 28340
FIO2: 21
O2 Saturation: 96.6 %
Patient temperature: 37
pCO2 arterial: 40.8 mmHg (ref 32.0–48.0)
pH, Arterial: 7.383 (ref 7.350–7.450)
pO2, Arterial: 86.5 mmHg (ref 83.0–108.0)

## 2020-10-03 LAB — CBC
HCT: 35.8 % — ABNORMAL LOW (ref 39.0–52.0)
HCT: 33.6 % — ABNORMAL LOW (ref 39.0–52.0)
HCT: 31.7 % — ABNORMAL LOW (ref 39.0–52.0)
Hemoglobin: 11.6 g/dL — ABNORMAL LOW (ref 13.0–17.0)
Hemoglobin: 10.9 g/dL — ABNORMAL LOW (ref 13.0–17.0)
Hemoglobin: 10.3 g/dL — ABNORMAL LOW (ref 13.0–17.0)
MCH: 32.4 pg (ref 26.0–34.0)
MCH: 32.7 pg (ref 26.0–34.0)
MCH: 32.5 pg (ref 26.0–34.0)
MCHC: 32.4 g/dL (ref 30.0–36.0)
MCHC: 32.4 g/dL (ref 30.0–36.0)
MCHC: 32.5 g/dL (ref 30.0–36.0)
MCV: 100 fL (ref 80.0–100.0)
MCV: 100.9 fL — ABNORMAL HIGH (ref 80.0–100.0)
MCV: 100 fL (ref 80.0–100.0)
Platelets: 257 10*3/uL (ref 150–400)
Platelets: 183 10*3/uL (ref 150–400)
Platelets: 184 10*3/uL (ref 150–400)
RBC: 3.58 MIL/uL — ABNORMAL LOW (ref 4.22–5.81)
RBC: 3.33 MIL/uL — ABNORMAL LOW (ref 4.22–5.81)
RBC: 3.17 MIL/uL — ABNORMAL LOW (ref 4.22–5.81)
RDW: 13.7 % (ref 11.5–15.5)
RDW: 13.6 % (ref 11.5–15.5)
RDW: 13.8 % (ref 11.5–15.5)
WBC: 6.6 10*3/uL (ref 4.0–10.5)
WBC: 15.4 10*3/uL — ABNORMAL HIGH (ref 4.0–10.5)
WBC: 12.5 10*3/uL — ABNORMAL HIGH (ref 4.0–10.5)
nRBC: 0 % (ref 0.0–0.2)
nRBC: 0 % (ref 0.0–0.2)
nRBC: 0 % (ref 0.0–0.2)

## 2020-10-03 LAB — MAGNESIUM: Magnesium: 2.2 mg/dL (ref 1.7–2.4)

## 2020-10-03 LAB — HEPARIN LEVEL (UNFRACTIONATED): Heparin Unfractionated: 0.31 IU/mL (ref 0.30–0.70)

## 2020-10-03 LAB — HEMOGLOBIN AND HEMATOCRIT, BLOOD
HCT: 26.9 % — ABNORMAL LOW (ref 39.0–52.0)
Hemoglobin: 8.8 g/dL — ABNORMAL LOW (ref 13.0–17.0)

## 2020-10-03 LAB — BASIC METABOLIC PANEL
Anion gap: 10 (ref 5–15)
Anion gap: 6 (ref 5–15)
BUN: 19 mg/dL (ref 6–20)
BUN: 21 mg/dL — ABNORMAL HIGH (ref 6–20)
CO2: 25 mmol/L (ref 22–32)
CO2: 20 mmol/L — ABNORMAL LOW (ref 22–32)
Calcium: 9.2 mg/dL (ref 8.9–10.3)
Calcium: 8.5 mg/dL — ABNORMAL LOW (ref 8.9–10.3)
Chloride: 103 mmol/L (ref 98–111)
Chloride: 110 mmol/L (ref 98–111)
Creatinine, Ser: 1.33 mg/dL — ABNORMAL HIGH (ref 0.61–1.24)
Creatinine, Ser: 1.49 mg/dL — ABNORMAL HIGH (ref 0.61–1.24)
GFR, Estimated: >60 mL/min (ref >60)
GFR, Estimated: 53 mL/min — ABNORMAL LOW (ref >60)
Glucose, Bld: 94 mg/dL (ref 70–99)
Glucose, Bld: 131 mg/dL — ABNORMAL HIGH (ref 70–99)
Potassium: 4.3 mmol/L (ref 3.5–5.1)
Potassium: 5.6 mmol/L — ABNORMAL HIGH (ref 3.5–5.1)
Sodium: 138 mmol/L (ref 135–145)
Sodium: 136 mmol/L (ref 135–145)

## 2020-10-03 LAB — URINALYSIS, ROUTINE W REFLEX MICROSCOPIC
Bacteria, UA: NONE SEEN
Bilirubin Urine: NEGATIVE
Glucose, UA: NEGATIVE mg/dL
Ketones, ur: NEGATIVE mg/dL
Leukocytes,Ua: NEGATIVE
Nitrite: NEGATIVE
Protein, ur: 100 mg/dL — AB
Specific Gravity, Urine: 1.01 (ref 1.005–1.030)
pH: 6 (ref 5.0–8.0)

## 2020-10-03 LAB — GLUCOSE, CAPILLARY
Glucose-Capillary: 125 mg/dL — ABNORMAL HIGH (ref 70–99)
Glucose-Capillary: 117 mg/dL — ABNORMAL HIGH (ref 70–99)
Glucose-Capillary: 115 mg/dL — ABNORMAL HIGH (ref 70–99)
Glucose-Capillary: 137 mg/dL — ABNORMAL HIGH (ref 70–99)
Glucose-Capillary: 133 mg/dL — ABNORMAL HIGH (ref 70–99)
Glucose-Capillary: 143 mg/dL — ABNORMAL HIGH (ref 70–99)
Glucose-Capillary: 149 mg/dL — ABNORMAL HIGH (ref 70–99)

## 2020-10-03 LAB — PLATELET COUNT: Platelets: 215 10*3/uL (ref 150–400)

## 2020-10-03 LAB — APTT
aPTT: 70 seconds — ABNORMAL HIGH (ref 24–36)
aPTT: 35 seconds (ref 24–36)

## 2020-10-03 LAB — PROTIME-INR
INR: 1 (ref 0.8–1.2)
INR: 1.3 — ABNORMAL HIGH (ref 0.8–1.2)
Prothrombin Time: 13.6 seconds (ref 11.4–15.2)
Prothrombin Time: 16 seconds — ABNORMAL HIGH (ref 11.4–15.2)

## 2020-10-03 LAB — HEMOGLOBIN A1C
Hgb A1c MFr Bld: 5.6 % (ref 4.8–5.6)
Mean Plasma Glucose: 114 mg/dL

## 2020-10-03 SURGERY — CORONARY ARTERY BYPASS GRAFTING (CABG)
Anesthesia: General | Site: Chest

## 2020-10-03 MED ORDER — CHLORHEXIDINE GLUCONATE CLOTH 2 % EX PADS
6.0000 | MEDICATED_PAD | Freq: Every day | CUTANEOUS | Status: DC
  Administered 2020-10-04: 6 via TOPICAL

## 2020-10-03 MED ORDER — LACTATED RINGERS IV SOLN: 500.0000 mL | Freq: Once | INTRAVENOUS | Status: DC | PRN

## 2020-10-03 MED ORDER — MIDAZOLAM HCL (PF) 10 MG/2ML IJ SOLN
INTRAMUSCULAR | Status: AC
  Filled 2020-10-03: qty 2

## 2020-10-03 MED ORDER — STERILE WATER FOR INJECTION IJ SOLN
INTRAMUSCULAR | Status: DC | PRN
  Administered 2020-10-03: 10 mL via INTRAMUSCULAR

## 2020-10-03 MED ORDER — HEMOSTATIC AGENTS (NO CHARGE) OPTIME
TOPICAL | Status: DC | PRN
  Administered 2020-10-03 (×2): 1 via TOPICAL

## 2020-10-03 MED ORDER — PHENYLEPHRINE 40 MCG/ML (10ML) SYRINGE FOR IV PUSH (FOR BLOOD PRESSURE SUPPORT)
PREFILLED_SYRINGE | INTRAVENOUS | Status: AC
  Filled 2020-10-03: qty 20

## 2020-10-03 MED ORDER — ASPIRIN EC 325 MG PO TBEC
325.0000 mg | DELAYED_RELEASE_TABLET | Freq: Every day | ORAL | Status: DC
  Administered 2020-10-04 – 2020-10-15 (×12): 325 mg via ORAL
  Filled 2020-10-03 (×12): qty 1

## 2020-10-03 MED ORDER — VANCOMYCIN HCL 1000 MG IV SOLR
INTRAVENOUS | Status: DC | PRN
  Administered 2020-10-03: 3 g via TOPICAL

## 2020-10-03 MED ORDER — CHLORHEXIDINE GLUCONATE CLOTH 2 % EX PADS
6.0000 | MEDICATED_PAD | Freq: Every day | CUTANEOUS | Status: DC
  Administered 2020-10-03 – 2020-10-08 (×4): 6 via TOPICAL

## 2020-10-03 MED ORDER — TRAMADOL HCL 50 MG PO TABS
50.0000 mg | ORAL_TABLET | ORAL | Status: DC | PRN
  Administered 2020-10-04: 50 mg via ORAL
  Administered 2020-10-04 – 2020-10-07 (×5): 100 mg via ORAL
  Filled 2020-10-03: qty 2
  Filled 2020-10-03: qty 1
  Filled 2020-10-03 (×4): qty 2

## 2020-10-03 MED ORDER — ROCURONIUM BROMIDE 10 MG/ML (PF) SYRINGE
PREFILLED_SYRINGE | INTRAVENOUS | Status: DC | PRN
  Administered 2020-10-03: 50 mg via INTRAVENOUS
  Administered 2020-10-03 (×3): 100 mg via INTRAVENOUS

## 2020-10-03 MED ORDER — DEXMEDETOMIDINE HCL IN NACL 400 MCG/100ML IV SOLN
0.0000 ug/kg/h | INTRAVENOUS | Status: DC
  Filled 2020-10-03: qty 100

## 2020-10-03 MED ORDER — ROCURONIUM BROMIDE 10 MG/ML (PF) SYRINGE
PREFILLED_SYRINGE | INTRAVENOUS | Status: AC
  Filled 2020-10-03: qty 40

## 2020-10-03 MED ORDER — BISACODYL 5 MG PO TBEC
10.0000 mg | DELAYED_RELEASE_TABLET | Freq: Every day | ORAL | Status: DC
  Administered 2020-10-04 – 2020-10-09 (×6): 10 mg via ORAL
  Filled 2020-10-03 (×7): qty 2

## 2020-10-03 MED ORDER — SODIUM CHLORIDE 0.9% FLUSH
10.0000 mL | INTRAVENOUS | Status: DC | PRN
  Administered 2020-10-03 – 2020-10-20 (×2): 10 mL

## 2020-10-03 MED ORDER — CHLORHEXIDINE GLUCONATE 0.12 % MT SOLN
15.0000 mL | OROMUCOSAL | Status: AC
  Administered 2020-10-03: 15 mL via OROMUCOSAL

## 2020-10-03 MED ORDER — SODIUM CHLORIDE 0.9% FLUSH: 10.0000 mL | Freq: Two times a day (BID) | INTRAVENOUS | Status: DC

## 2020-10-03 MED ORDER — TRANEXAMIC ACID 1000 MG/10ML IV SOLN
1.5000 mg/kg/h | INTRAVENOUS | Status: DC
  Filled 2020-10-03: qty 25

## 2020-10-03 MED ORDER — LEVALBUTEROL HCL 0.63 MG/3ML IN NEBU
0.6300 mg | INHALATION_SOLUTION | Freq: Four times a day (QID) | RESPIRATORY_TRACT | Status: DC
  Administered 2020-10-03: 0.63 mg via RESPIRATORY_TRACT
  Filled 2020-10-03: qty 3

## 2020-10-03 MED ORDER — OXYCODONE HCL 5 MG PO TABS
5.0000 mg | ORAL_TABLET | ORAL | Status: DC | PRN
  Administered 2020-10-03 – 2020-10-04 (×5): 10 mg via ORAL
  Administered 2020-10-04 – 2020-10-05 (×2): 5 mg via ORAL
  Administered 2020-10-05 – 2020-10-29 (×7): 10 mg via ORAL
  Filled 2020-10-03 (×14): qty 2
  Filled 2020-10-03: qty 1

## 2020-10-03 MED ORDER — SODIUM CHLORIDE (PF) 0.9 % IJ SOLN
OROMUCOSAL | Status: DC | PRN
  Administered 2020-10-03: 4 mL via TOPICAL

## 2020-10-03 MED ORDER — LACTATED RINGERS IV SOLN: INTRAVENOUS | Status: DC

## 2020-10-03 MED ORDER — NITROGLYCERIN IN D5W 200-5 MCG/ML-% IV SOLN: 0.0000 ug/min | INTRAVENOUS | Status: DC

## 2020-10-03 MED ORDER — ACETAMINOPHEN 160 MG/5ML PO SOLN: 1000.0000 mg | Freq: Four times a day (QID) | ORAL | Status: DC

## 2020-10-03 MED ORDER — VANCOMYCIN HCL IN DEXTROSE 1-5 GM/200ML-% IV SOLN
1000.0000 mg | Freq: Once | INTRAVENOUS | Status: AC
  Administered 2020-10-03: 1000 mg via INTRAVENOUS
  Filled 2020-10-03: qty 200

## 2020-10-03 MED ORDER — HEPARIN SODIUM (PORCINE) 1000 UNIT/ML IJ SOLN
INTRAMUSCULAR | Status: DC | PRN
  Administered 2020-10-03: 46000 [IU] via INTRAVENOUS

## 2020-10-03 MED ORDER — METOPROLOL TARTRATE 5 MG/5ML IV SOLN: 2.5000 mg | INTRAVENOUS | Status: DC | PRN

## 2020-10-03 MED ORDER — INSULIN REGULAR(HUMAN) IN NACL 100-0.9 UT/100ML-% IV SOLN: INTRAVENOUS | Status: DC

## 2020-10-03 MED ORDER — SODIUM CHLORIDE 0.9% FLUSH: 3.0000 mL | INTRAVENOUS | Status: DC | PRN

## 2020-10-03 MED ORDER — NOREPINEPHRINE 4 MG/250ML-% IV SOLN
0.0000 ug/min | INTRAVENOUS | Status: DC
  Administered 2020-10-03: 2 ug/min via INTRAVENOUS
  Filled 2020-10-03: qty 250

## 2020-10-03 MED ORDER — FENTANYL CITRATE (PF) 250 MCG/5ML IJ SOLN
INTRAMUSCULAR | Status: AC
  Filled 2020-10-03: qty 25

## 2020-10-03 MED ORDER — METOPROLOL TARTRATE 12.5 MG HALF TABLET
12.5000 mg | ORAL_TABLET | Freq: Two times a day (BID) | ORAL | Status: DC
  Administered 2020-10-07 – 2020-10-12 (×10): 12.5 mg via ORAL
  Filled 2020-10-03 (×14): qty 1

## 2020-10-03 MED ORDER — PANTOPRAZOLE SODIUM 40 MG PO TBEC
40.0000 mg | DELAYED_RELEASE_TABLET | Freq: Every day | ORAL | Status: DC
  Administered 2020-10-05 – 2020-11-05 (×31): 40 mg via ORAL
  Filled 2020-10-03 (×31): qty 1

## 2020-10-03 MED ORDER — ASPIRIN 81 MG PO CHEW
324.0000 mg | CHEWABLE_TABLET | Freq: Every day | ORAL | Status: DC
  Filled 2020-10-03: qty 4

## 2020-10-03 MED ORDER — HEPARIN SODIUM (PORCINE) 1000 UNIT/ML IJ SOLN
INTRAMUSCULAR | Status: AC
  Filled 2020-10-03: qty 1

## 2020-10-03 MED ORDER — POTASSIUM CHLORIDE 10 MEQ/50ML IV SOLN: 10.0000 meq | INTRAVENOUS | Status: AC

## 2020-10-03 MED ORDER — DEXTROSE 50 % IV SOLN: 0.0000 mL | INTRAVENOUS | Status: DC | PRN

## 2020-10-03 MED ORDER — PROTAMINE SULFATE 10 MG/ML IV SOLN
INTRAVENOUS | Status: AC
  Filled 2020-10-03: qty 5

## 2020-10-03 MED ORDER — METOPROLOL TARTRATE 25 MG/10 ML ORAL SUSPENSION: 12.5000 mg | Freq: Two times a day (BID) | ORAL | Status: DC

## 2020-10-03 MED ORDER — BUPIVACAINE LIPOSOME 1.3 % IJ SUSP
INTRAMUSCULAR | Status: DC | PRN
  Administered 2020-10-03: 50 mL

## 2020-10-03 MED ORDER — ONDANSETRON HCL 4 MG/2ML IJ SOLN
4.0000 mg | Freq: Four times a day (QID) | INTRAMUSCULAR | Status: DC | PRN
  Administered 2020-10-03 – 2020-10-06 (×2): 4 mg via INTRAVENOUS
  Filled 2020-10-03 (×2): qty 2

## 2020-10-03 MED ORDER — MORPHINE SULFATE (PF) 2 MG/ML IV SOLN
1.0000 mg | INTRAVENOUS | Status: DC | PRN
  Administered 2020-10-03 (×2): 2 mg via INTRAVENOUS
  Filled 2020-10-03 (×2): qty 1

## 2020-10-03 MED ORDER — ALBUMIN HUMAN 5 % IV SOLN: INTRAVENOUS | Status: DC | PRN

## 2020-10-03 MED ORDER — PROTAMINE SULFATE 10 MG/ML IV SOLN
INTRAVENOUS | Status: DC | PRN
  Administered 2020-10-03: 280 mg via INTRAVENOUS

## 2020-10-03 MED ORDER — HEPARIN SODIUM (PORCINE) 1000 UNIT/ML IJ SOLN
INTRAMUSCULAR | Status: AC
  Filled 2020-10-03: qty 2

## 2020-10-03 MED ORDER — BUPIVACAINE LIPOSOME 1.3 % IJ SUSP
INTRAMUSCULAR | Status: AC
  Filled 2020-10-03: qty 20

## 2020-10-03 MED ORDER — PHENYLEPHRINE HCL-NACL 20-0.9 MG/250ML-% IV SOLN: 0.0000 ug/min | INTRAVENOUS | Status: DC

## 2020-10-03 MED ORDER — PROPOFOL 10 MG/ML IV BOLUS
INTRAVENOUS | Status: AC
  Filled 2020-10-03: qty 20

## 2020-10-03 MED ORDER — LEVALBUTEROL HCL 0.63 MG/3ML IN NEBU
0.6300 mg | INHALATION_SOLUTION | Freq: Three times a day (TID) | RESPIRATORY_TRACT | Status: DC
  Administered 2020-10-04: 0.63 mg via RESPIRATORY_TRACT
  Filled 2020-10-03: qty 3

## 2020-10-03 MED ORDER — 0.9 % SODIUM CHLORIDE (POUR BTL) OPTIME
TOPICAL | Status: DC | PRN
  Administered 2020-10-03: 5000 mL

## 2020-10-03 MED ORDER — MIDAZOLAM HCL 2 MG/2ML IJ SOLN: 2.0000 mg | INTRAMUSCULAR | Status: DC | PRN

## 2020-10-03 MED ORDER — ONDANSETRON HCL 4 MG/2ML IJ SOLN
INTRAMUSCULAR | Status: AC
  Filled 2020-10-03: qty 2

## 2020-10-03 MED ORDER — MAGNESIUM SULFATE 4 GM/100ML IV SOLN
4.0000 g | Freq: Once | INTRAVENOUS | Status: AC
  Administered 2020-10-03: 4 g via INTRAVENOUS
  Filled 2020-10-03: qty 100

## 2020-10-03 MED ORDER — ACETAMINOPHEN 500 MG PO TABS
1000.0000 mg | ORAL_TABLET | Freq: Four times a day (QID) | ORAL | Status: AC
  Administered 2020-10-03 – 2020-10-08 (×16): 1000 mg via ORAL
  Filled 2020-10-03 (×17): qty 2

## 2020-10-03 MED ORDER — ONDANSETRON HCL 4 MG/2ML IJ SOLN
INTRAMUSCULAR | Status: DC | PRN
  Administered 2020-10-03: 4 mg via INTRAVENOUS

## 2020-10-03 MED ORDER — BISACODYL 10 MG RE SUPP
10.0000 mg | Freq: Every day | RECTAL | Status: DC
  Filled 2020-10-03: qty 1

## 2020-10-03 MED ORDER — SODIUM CHLORIDE 0.9 % IV SOLN: 250.0000 mL | INTRAVENOUS | Status: DC

## 2020-10-03 MED ORDER — HEMOSTATIC AGENTS (NO CHARGE) OPTIME
TOPICAL | Status: DC | PRN
  Administered 2020-10-03: 1 via TOPICAL

## 2020-10-03 MED ORDER — MIDAZOLAM HCL (PF) 5 MG/ML IJ SOLN
INTRAMUSCULAR | Status: DC | PRN
  Administered 2020-10-03 (×2): 2 mg via INTRAVENOUS
  Administered 2020-10-03 (×3): 1 mg via INTRAVENOUS

## 2020-10-03 MED ORDER — FAMOTIDINE IN NACL 20-0.9 MG/50ML-% IV SOLN
20.0000 mg | Freq: Two times a day (BID) | INTRAVENOUS | Status: DC
  Administered 2020-10-03: 20 mg via INTRAVENOUS
  Filled 2020-10-03 (×2): qty 50

## 2020-10-03 MED ORDER — LEVALBUTEROL TARTRATE 45 MCG/ACT IN AERO: 2.0000 | INHALATION_SPRAY | Freq: Four times a day (QID) | RESPIRATORY_TRACT | Status: DC

## 2020-10-03 MED ORDER — BUPIVACAINE HCL (PF) 0.5 % IJ SOLN
INTRAMUSCULAR | Status: AC
  Filled 2020-10-03: qty 30

## 2020-10-03 MED ORDER — CEFAZOLIN SODIUM-DEXTROSE 2-3 GM-%(50ML) IV SOLR
INTRAVENOUS | Status: DC | PRN
  Administered 2020-10-03: 2 g via INTRAVENOUS

## 2020-10-03 MED ORDER — LACTATED RINGERS IV SOLN: INTRAVENOUS | Status: DC | PRN

## 2020-10-03 MED ORDER — ACETAMINOPHEN 650 MG RE SUPP
650.0000 mg | Freq: Once | RECTAL | Status: AC
  Administered 2020-10-03: 650 mg via RECTAL

## 2020-10-03 MED ORDER — PROTAMINE SULFATE 10 MG/ML IV SOLN
INTRAVENOUS | Status: AC
  Filled 2020-10-03: qty 25

## 2020-10-03 MED ORDER — PLASMA-LYTE A IV SOLN: INTRAVENOUS | Status: DC

## 2020-10-03 MED ORDER — ACETAMINOPHEN 160 MG/5ML PO SOLN
650.0000 mg | Freq: Once | ORAL | Status: AC
Start: 2020-10-03 — End: 2020-10-03

## 2020-10-03 MED ORDER — STERILE WATER FOR INJECTION IJ SOLN
INTRAMUSCULAR | Status: AC
  Filled 2020-10-03: qty 10

## 2020-10-03 MED ORDER — ALBUMIN HUMAN 5 % IV SOLN
250.0000 mL | INTRAVENOUS | Status: AC | PRN
  Administered 2020-10-03 – 2020-10-04 (×4): 12.5 g via INTRAVENOUS
  Filled 2020-10-03 (×2): qty 250

## 2020-10-03 MED ORDER — DOCUSATE SODIUM 100 MG PO CAPS
200.0000 mg | ORAL_CAPSULE | Freq: Every day | ORAL | Status: DC
  Administered 2020-10-04 – 2020-10-09 (×6): 200 mg via ORAL
  Filled 2020-10-03 (×7): qty 2

## 2020-10-03 MED ORDER — CEFAZOLIN SODIUM-DEXTROSE 2-4 GM/100ML-% IV SOLN
2.0000 g | Freq: Three times a day (TID) | INTRAVENOUS | Status: AC
  Administered 2020-10-03 – 2020-10-05 (×6): 2 g via INTRAVENOUS
  Filled 2020-10-03 (×6): qty 100

## 2020-10-03 MED ORDER — SODIUM CHLORIDE 0.9% FLUSH: 3.0000 mL | Freq: Two times a day (BID) | INTRAVENOUS | Status: DC

## 2020-10-03 MED ORDER — SODIUM CHLORIDE 0.9 % IV SOLN: INTRAVENOUS | Status: DC

## 2020-10-03 MED ORDER — FENTANYL CITRATE (PF) 250 MCG/5ML IJ SOLN
INTRAMUSCULAR | Status: DC | PRN
  Administered 2020-10-03: 100 ug via INTRAVENOUS
  Administered 2020-10-03: 50 ug via INTRAVENOUS
  Administered 2020-10-03 (×2): 150 ug via INTRAVENOUS
  Administered 2020-10-03: 100 ug via INTRAVENOUS
  Administered 2020-10-03: 50 ug via INTRAVENOUS
  Administered 2020-10-03 (×2): 100 ug via INTRAVENOUS
  Administered 2020-10-03: 300 ug via INTRAVENOUS
  Administered 2020-10-03: 150 ug via INTRAVENOUS

## 2020-10-03 MED ORDER — PROPOFOL 10 MG/ML IV BOLUS
INTRAVENOUS | Status: DC | PRN
  Administered 2020-10-03: 20 mg via INTRAVENOUS
  Administered 2020-10-03: 30 mg via INTRAVENOUS

## 2020-10-03 MED ORDER — PLATELET POOR PLASMA OPTIME
Status: DC | PRN
  Administered 2020-10-03: 10 mL

## 2020-10-03 MED ORDER — SODIUM CHLORIDE 0.45 % IV SOLN: INTRAVENOUS | Status: DC | PRN

## 2020-10-03 MED ORDER — ARTIFICIAL TEARS OPHTHALMIC OINT
TOPICAL_OINTMENT | OPHTHALMIC | Status: DC | PRN
  Administered 2020-10-03: 1 via OPHTHALMIC

## 2020-10-03 MED ORDER — THROMBIN 5000 UNITS EX SOLR
INTRAVENOUS | Status: DC | PRN
  Administered 2020-10-03: 2 mL

## 2020-10-03 MED ORDER — PLATELET RICH PLASMA OPTIME
Status: DC | PRN
  Administered 2020-10-03: 10 mL

## 2020-10-03 MED ORDER — SODIUM CHLORIDE 0.9 % IV SOLN: INTRAVENOUS | Status: DC | PRN

## 2020-10-03 MED ORDER — VANCOMYCIN HCL 1000 MG IV SOLR
INTRAVENOUS | Status: AC
  Filled 2020-10-03: qty 3000

## 2020-10-03 MED ORDER — PHENYLEPHRINE 40 MCG/ML (10ML) SYRINGE FOR IV PUSH (FOR BLOOD PRESSURE SUPPORT)
PREFILLED_SYRINGE | INTRAVENOUS | Status: DC | PRN
  Administered 2020-10-03: 80 ug via INTRAVENOUS
  Administered 2020-10-03: 40 ug via INTRAVENOUS
  Administered 2020-10-03: 80 ug via INTRAVENOUS
  Administered 2020-10-03 (×2): 40 ug via INTRAVENOUS

## 2020-10-03 SURGICAL SUPPLY — 121 items
ADAPTER CARDIO PERF ANTE/RETRO (ADAPTER) ×4 IMPLANT
ADH SKN CLS APL DERMABOND .7 (GAUZE/BANDAGES/DRESSINGS) ×3
ADPR PRFSN 84XANTGRD RTRGD (ADAPTER) ×3
ADPR TBG 2 MALE LL ART (MISCELLANEOUS) ×3
APL SRG 7X2 LUM MLBL SLNT (VASCULAR PRODUCTS) ×3
APPLICATOR TIP COSEAL (VASCULAR PRODUCTS) ×1 IMPLANT
ATRICLIP EXCLUSION VLAA SYSTEM (Miscellaneous) ×4 IMPLANT
BAG DECANTER FOR FLEXI CONT (MISCELLANEOUS) ×4 IMPLANT
BLADE CLIPPER SURG (BLADE) ×4 IMPLANT
BLADE STERNUM SYSTEM 6 (BLADE) ×4 IMPLANT
BLADE SURG 11 STRL SS (BLADE) ×1 IMPLANT
BNDG ELASTIC 4X5.8 VLCR STR LF (GAUZE/BANDAGES/DRESSINGS) ×4 IMPLANT
BNDG ELASTIC 6X5.8 VLCR STR LF (GAUZE/BANDAGES/DRESSINGS) ×4 IMPLANT
BNDG GAUZE ELAST 4 BULKY (GAUZE/BANDAGES/DRESSINGS) ×4 IMPLANT
CANISTER SUCT 3000ML PPV (MISCELLANEOUS) ×4 IMPLANT
CANN PRFSN 3/8XRT ANG TPR 14 (MISCELLANEOUS) ×6
CANNULA NON VENT 22FR 12 (CANNULA) ×1 IMPLANT
CANNULA PRFSN 3/8XRT ANG TPR14 (MISCELLANEOUS) IMPLANT
CANNULA VEN MTL TIP RT (MISCELLANEOUS) ×8
CANNULA VRC MALB SNGL STG 34FR (MISCELLANEOUS) IMPLANT
CATH CPB KIT HENDRICKSON (MISCELLANEOUS) ×4 IMPLANT
CATH RETROPLEGIA CORONARY 14FR (CATHETERS) ×1 IMPLANT
CATH ROBINSON RED A/P 18FR (CATHETERS) ×11 IMPLANT
CLIP RETRACTION 3.0MM CORONARY (MISCELLANEOUS) ×4 IMPLANT
CLIP VESOCCLUDE MED 24/CT (CLIP) ×1 IMPLANT
CLIP VESOCCLUDE SM WIDE 24/CT (CLIP) ×12 IMPLANT
CNTNR URN SCR LID CUP LEK RST (MISCELLANEOUS) IMPLANT
CONN 1/2X1/2X1/2  BEN (MISCELLANEOUS) ×4
CONN 1/2X1/2X1/2 BEN (MISCELLANEOUS) IMPLANT
CONN ST 1/4X3/8  BEN (MISCELLANEOUS) ×16
CONN ST 1/4X3/8 BEN (MISCELLANEOUS) IMPLANT
CONN ST 3/8 X 1/2 (MISCELLANEOUS) ×2 IMPLANT
CONT SPEC 4OZ STRL OR WHT (MISCELLANEOUS) ×12
CONTAINER PROTECT SURGISLUSH (MISCELLANEOUS) ×4 IMPLANT
DERMABOND ADVANCED (GAUZE/BANDAGES/DRESSINGS) ×1
DERMABOND ADVANCED .7 DNX12 (GAUZE/BANDAGES/DRESSINGS) ×3 IMPLANT
DRAIN CHANNEL 28F RND 3/8 FF (WOUND CARE) ×13 IMPLANT
DRAPE CARDIOVASCULAR INCISE (DRAPES) ×4
DRAPE SRG 135X102X78XABS (DRAPES) ×3 IMPLANT
DRAPE WARM FLUID 44X44 (DRAPES) ×5 IMPLANT
DRSG AQUACEL AG ADV 3.5X14 (GAUZE/BANDAGES/DRESSINGS) ×4 IMPLANT
ELECT CAUTERY BLADE 6.4 (BLADE) ×4 IMPLANT
ELECT REM PT RETURN 9FT ADLT (ELECTROSURGICAL) ×8
ELECTRODE REM PT RTRN 9FT ADLT (ELECTROSURGICAL) ×6 IMPLANT
FELT TEFLON 1X6 (MISCELLANEOUS) ×8 IMPLANT
GAUZE SPONGE 4X4 12PLY STRL (GAUZE/BANDAGES/DRESSINGS) ×8 IMPLANT
GAUZE SPONGE 4X4 12PLY STRL LF (GAUZE/BANDAGES/DRESSINGS) ×2 IMPLANT
GLOVE NEODERM STRL 7.5  LF PF (GLOVE) ×9
GLOVE NEODERM STRL 7.5 LF PF (GLOVE) ×9 IMPLANT
GLOVE SURG NEODERM 7.5  LF PF (GLOVE) ×3
GLOVE SURG UNDER POLY LF SZ6 (GLOVE) ×7 IMPLANT
GOWN STRL REUS W/ TWL LRG LVL3 (GOWN DISPOSABLE) ×12 IMPLANT
GOWN STRL REUS W/TWL LRG LVL3 (GOWN DISPOSABLE) ×44
HEMOSTAT POWDER SURGIFOAM 1G (HEMOSTASIS) ×7 IMPLANT
INSERT FOGARTY XLG (MISCELLANEOUS) ×4 IMPLANT
INSERT SUTURE HOLDER (MISCELLANEOUS) ×4 IMPLANT
IV ADAPTER SYR DOUBLE MALE LL (MISCELLANEOUS) ×1 IMPLANT
KIT APPLICATOR RATIO 11:1 (KITS) ×1 IMPLANT
KIT BASIN OR (CUSTOM PROCEDURE TRAY) ×4 IMPLANT
KIT SUCTION CATH 14FR (SUCTIONS) ×4 IMPLANT
KIT TURNOVER KIT B (KITS) ×4 IMPLANT
KIT VASOVIEW HEMOPRO 2 VH 4000 (KITS) ×4 IMPLANT
LINE VENT (MISCELLANEOUS) ×1 IMPLANT
MARKER GRAFT CORONARY BYPASS (MISCELLANEOUS) ×12 IMPLANT
NDL 18GX1X1/2 (RX/OR ONLY) (NEEDLE) ×3 IMPLANT
NEEDLE 18GX1X1/2 (RX/OR ONLY) (NEEDLE) ×4 IMPLANT
NS IRRIG 1000ML POUR BTL (IV SOLUTION) ×20 IMPLANT
PACK E OPEN HEART (SUTURE) ×4 IMPLANT
PACK OPEN HEART (CUSTOM PROCEDURE TRAY) ×4 IMPLANT
PACK PLATELET PROCEDURE 60 (MISCELLANEOUS) ×1 IMPLANT
PACK SPY-PHI (KITS) ×1 IMPLANT
PAD ARMBOARD 7.5X6 YLW CONV (MISCELLANEOUS) ×8 IMPLANT
PAD ELECT DEFIB RADIOL ZOLL (MISCELLANEOUS) ×4 IMPLANT
PENCIL BUTTON HOLSTER BLD 10FT (ELECTRODE) ×4 IMPLANT
POSITIONER HEAD DONUT 9IN (MISCELLANEOUS) ×4 IMPLANT
POWDER SURGICEL 3.0 GRAM (HEMOSTASIS) ×5 IMPLANT
PUNCH AORTIC ROTATE 5MM 8IN (MISCELLANEOUS) ×1 IMPLANT
SEALANT SURG COSEAL 4ML (VASCULAR PRODUCTS) ×4 IMPLANT
SEALANT SURG COSEAL 8ML (VASCULAR PRODUCTS) IMPLANT
SET MPS 3-ND DEL (MISCELLANEOUS) ×1 IMPLANT
STRIP PERIGUARD 6X8 (Vascular Products) ×1 IMPLANT
SUT BONE WAX W31G (SUTURE) ×4 IMPLANT
SUT MNCRL AB 3-0 PS2 18 (SUTURE) ×8 IMPLANT
SUT MNCRL AB 4-0 PS2 18 (SUTURE) ×1 IMPLANT
SUT PDS AB 1 CTX 36 (SUTURE) ×8 IMPLANT
SUT PROLENE 3 0 SH DA (SUTURE) ×12 IMPLANT
SUT PROLENE 4 0 SH DA (SUTURE) ×2 IMPLANT
SUT PROLENE 5 0 C 1 36 (SUTURE) ×1 IMPLANT
SUT PROLENE 6 0 C 1 30 (SUTURE) ×12 IMPLANT
SUT PROLENE 7 0 BV 1 (SUTURE) ×3 IMPLANT
SUT PROLENE 8 0 BV175 6 (SUTURE) IMPLANT
SUT PROLENE BLUE 7 0 (SUTURE) ×4 IMPLANT
SUT SILK  1 MH (SUTURE) ×8
SUT SILK 1 MH (SUTURE) IMPLANT
SUT SILK 2 0 SH CR/8 (SUTURE) IMPLANT
SUT SILK 3 0 SH CR/8 (SUTURE) IMPLANT
SUT STEEL 6MS V (SUTURE) ×4 IMPLANT
SUT STEEL STERNAL CCS#1 18IN (SUTURE) ×1 IMPLANT
SUT STEEL SZ 6 DBL 3X14 BALL (SUTURE) ×5 IMPLANT
SUT VIC AB 2-0 CT1 27 (SUTURE) ×4
SUT VIC AB 2-0 CT1 TAPERPNT 27 (SUTURE) IMPLANT
SUT VIC AB 2-0 CTX 27 (SUTURE) IMPLANT
SUT VIC AB 3-0 X1 27 (SUTURE) IMPLANT
SYR 10ML LL (SYRINGE) IMPLANT
SYR 30ML LL (SYRINGE) ×4 IMPLANT
SYR 3ML LL SCALE MARK (SYRINGE) ×4 IMPLANT
SYSTEM EXCLUSION ATRICLIP VLAA (Miscellaneous) IMPLANT
SYSTEM SAHARA CHEST DRAIN ATS (WOUND CARE) ×4 IMPLANT
TAPE CLOTH SURG 4X10 WHT LF (GAUZE/BANDAGES/DRESSINGS) ×2 IMPLANT
TAPE PAPER 2X10 WHT MICROPORE (GAUZE/BANDAGES/DRESSINGS) ×1 IMPLANT
TIP DUAL SPRAY TOPICAL (TIP) ×1 IMPLANT
TOWEL GREEN STERILE (TOWEL DISPOSABLE) ×4 IMPLANT
TOWEL GREEN STERILE FF (TOWEL DISPOSABLE) ×4 IMPLANT
TRAY FOLEY SLVR 16FR TEMP STAT (SET/KITS/TRAYS/PACK) ×4 IMPLANT
TUBING ART PRESS 72  MALE/FEM (TUBING) ×8
TUBING ART PRESS 72 MALE/FEM (TUBING) IMPLANT
TUBING LAP HI FLOW INSUFFLATIO (TUBING) ×4 IMPLANT
UNDERPAD 30X36 HEAVY ABSORB (UNDERPADS AND DIAPERS) ×4 IMPLANT
VRC MALLEABLE SINGLE STG 34FR (MISCELLANEOUS) ×4
WATER STERILE IRR 1000ML POUR (IV SOLUTION) ×8 IMPLANT
WATER STERILE IRR 1000ML UROMA (IV SOLUTION) IMPLANT

## 2020-10-03 NOTE — Discharge Summary (Deleted)
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 in stable position. Cardiomegaly. No pulmonary venous congestion. Low lung volumes with mild bibasilar atelectasis. No pleural effusion or pneumothorax. Gastric band again noted. Gastric distention again noted. IMPRESSION: 1. Prior CABG. Left atrial appendage clip in stable position. Stable cardiomegaly. No pulmonary venous congestion. 2. Low lung volumes with mild bibasilar atelectasis. 3.  Gastric band again noted.  Gastric distention again noted. Electronically Signed   By: Marcello Moores  Register   On: 10/08/2020 11:11   DG Chest Port 1 View  Result Date: 10/05/2020 CLINICAL DATA:  Status post open heart surgery. EXAM: PORTABLE CHEST 1 VIEW COMPARISON:  Chest radiograph 10/04/2020. FINDINGS: Interval retraction PA catheter sheath. Monitoring leads stable right IJ overlie the patient. Chest tubes and mediastinal drain remain in position. Stable cardiac and mediastinal contours. Low lung volumes. Similar bilateral patchy airspace opacities. No pleural effusion or pneumothorax. Probable gaseous distended colon left upper quadrant. IMPRESSION: Interval retraction PA catheter. Similar-appearing bilateral atelectasis. Probable gaseous distended colon left upper quadrant. Consider correlation with dedicated abdominal radiograph.  Electronically Signed   By: Lovey Newcomer M.D.   On: 10/05/2020 09:46   DG Chest Port 1 View  Result Date: 10/04/2020 CLINICAL DATA:  Post cardiac surgery EXAM: PORTABLE CHEST 1 VIEW COMPARISON:  10/03/2020 FINDINGS: Endotracheal tube is no longer present. Right IJ Swan-Ganz catheter remains. Chest tubes and mediastinal drain are again noted. Low lung volumes with bibasilar atelectasis. Central pulmonary vascular congestion. No significant pleural effusion. No pneumothorax. Similar cardiomediastinal contours. IMPRESSION: Apart from extubation, no substantial change since the prior study. Electronically Signed   By: Macy Mis M.D.   On: 10/04/2020 08:10   DG CHEST PORT 1 VIEW  Addendum Date: 10/03/2020   ADDENDUM REPORT: 10/03/2020 16:13 ADDENDUM: These results were called by telephone at the time of interpretation on 10/03/2020 at 4:13 pm to provider BROADUS ATKINS , who verbally acknowledged these results. Electronically Signed   By: Ilona Sorrel M.D.   On: 10/03/2020 16:13   Result Date: 10/03/2020 CLINICAL DATA:  Intubated, OG tube attempt EXAM: PORTABLE CHEST 1 VIEW COMPARISON:  Chest radiograph from earlier today. FINDINGS: Endotracheal tube tip is 9.2 cm above the carina. No evidence of orogastric tube. Right internal jugular Swan-Ganz  catheter terminates in the main pulmonary artery. Intact sternotomy wires. Stable bilateral chest tubes and 2 mediastinal drains. CABG clips overlie the mediastinum. Stable cardiomediastinal silhouette with mild cardiomegaly. No pneumothorax. No pleural effusion. No overt pulmonary edema. Mild platelike atelectasis in the left mid lung and bilateral lung bases, stable. IMPRESSION: 1. Endotracheal tube tip is 9.2 cm above the carina, suggested advancement 3-4 cm. No evidence of orogastric tube. 2. Stable mild cardiomegaly without overt pulmonary edema. 3. Stable atelectasis in the left mid lung and bilateral lung bases. No pneumothorax. Electronically Signed: By:  Ilona Sorrel M.D. On: 10/03/2020 15:53   DG Chest Port 1 View  Result Date: 10/03/2020 CLINICAL DATA:  Intubated, post CABG EXAM: PORTABLE CHEST 1 VIEW COMPARISON:  09/29/2020 chest radiograph. FINDINGS: Endotracheal tube tip is 4.5 cm above the carina. Right internal jugular Swan-Ganz catheter terminates over the main pulmonary artery. Intact sternotomy wires. Two left mediastinal drains and bilateral basilar chest tubes in place. Stable cardiomediastinal silhouette with normal heart size. No pneumothorax. No pleural effusion. Mild platelike atelectasis in the left mid lung and bibasilar lungs. No pulmonary edema. IMPRESSION: 1. Well-positioned support structures. No pneumothorax. 2. Mild platelike atelectasis in the left mid lung and bibasilar lungs. Electronically Signed   By: Ilona Sorrel M.D.   On: 10/03/2020 15:51   DG Chest Port 1 View  Result Date: 09/29/2020 CLINICAL DATA:  Chest pain EXAM: PORTABLE CHEST 1 VIEW COMPARISON:  01/22/2020 FINDINGS: Heart and mediastinal contours are within normal limits. No focal opacities or effusions. No acute bony abnormality. IMPRESSION: No active disease. Electronically Signed   By: Rolm Baptise M.D.   On: 09/29/2020 23:02   DG ABD ACUTE 2+V W 1V CHEST  Result Date: 10/06/2020 CLINICAL DATA:  Abdominal pain. EXAM: DG ABDOMEN ACUTE WITH 1 VIEW CHEST COMPARISON:  CT of the abdomen January 17, 2020 FINDINGS: Diffuse nonspecific gaseous distension of stomach, small bowel loops and colon. Sequela of gastric banding. Partially visualized median sternotomy. IMPRESSION: Diffuse nonspecific gaseous distension of stomach, small bowel loops and colon may represent ileus. Electronically Signed   By: Fidela Salisbury M.D.   On: 10/06/2020 13:32   DG Abd Portable 1V  Result Date: 10/10/2020 CLINICAL DATA:  Evaluate ileus EXAM: PORTABLE ABDOMEN - 1 VIEW COMPARISON:  CT AP 10/09/2020 FINDINGS: Gastric band identified. Gaseous distension of the stomach is again noted,  unchanged. Persistent gaseous distension of the small bowel loops with gas and stool noted throughout the colon up to the rectum. IMPRESSION: Persistent gaseous distension of the stomach and small bowel loops compatible with either ileus or partial small bowel obstruction. Electronically Signed   By: Kerby Moors M.D.   On: 10/10/2020 11:08   ECHOCARDIOGRAM COMPLETE  Result Date: 10/02/2020    ECHOCARDIOGRAM REPORT   Patient Name:   Cloyde ZURIEL ROSKOS Date of Exam: 10/02/2020 Medical Rec #:  [id number]      Height:       75.0 in Accession #:    [id number]     Weight:       289.5 lb Date of Birth:  Oct 22, 1960      BSA:          2.568 m Patient Age:    60 years       BP:           119/74 mmHg Patient Gender: M              HR:  62 bpm. 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 CAD and Acute MI, Abnormal ECG, Aortic Valve                 Disease, Arrythmias:Atrial Flutter; Risk Factors:Hypertension                 and Dyslipidemia. Aortic stenosis.  Sonographer:    Roseanna Rainbow RDCS Referring Phys: [place] Comments: Image acquisition challenging due to patient body habitus. Patient is post cardiac cath procedure. IMPRESSIONS  1. Left ventricular ejection fraction, by estimation, is 50 to 55%. Left ventricular ejection fraction by 3D volume is 52 %. The left ventricle has low normal function. The left ventricle has no regional wall motion abnormalities. There is moderate concentric left ventricular hypertrophy. Left ventricular diastolic parameters are indeterminate.  2. Right ventricular systolic function is normal. The right ventricular size is normal. Tricuspid regurgitation signal is inadequate for assessing PA  pressure.  3. The mitral valve is grossly normal. No evidence of mitral valve regurgitation. The mean mitral valve gradient is 3.5 mmHg with average heart rate of 66 bpm.  4. The aortic valve is calcified. Aortic valve regurgitation is not visualized. Mild to moderate aortic valve stenosis. Aortic valve mean gradient measures 29.0 mmHg.  5. Aortic dilatation noted. There is mild dilatation of the ascending aorta, measuring 42 mm.  6. The inferior vena cava is normal in size with greater than 50% respiratory variability, suggesting right atrial pressure of 3 mmHg. Comparison(s): A prior study was performed on 06/05/20. Prior images reviewed side by side. Aortic Gradients have increased from prior. FINDINGS  Left Ventricle: Left ventricular ejection fraction, by estimation, is 50 to 55%. Left ventricular ejection fraction by 3D volume is 52 %. The left ventricle has low normal function. The left ventricle has no regional wall motion abnormalities. The left ventricular internal cavity size was normal in size. There is moderate concentric left ventricular hypertrophy. Left ventricular diastolic parameters are indeterminate. Right Ventricle: The right ventricular size is normal. No increase in right ventricular wall thickness. Right ventricular systolic function is normal. Tricuspid regurgitation signal is inadequate for assessing PA pressure. Left Atrium: Left atrial size was normal in size. Right Atrium: Right atrial size was normal in size. Pericardium: There is no evidence of pericardial effusion. Mitral Valve: The mitral valve is grossly normal. Mild mitral annular calcification. No evidence of mitral valve regurgitation. The mean mitral valve gradient is 3.5 mmHg with average heart rate of 66 bpm. Tricuspid Valve: The tricuspid valve is normal in structure. Tricuspid valve regurgitation is trivial. Aortic Valve: The aortic valve is calcified. Aortic valve regurgitation is not visualized. Mild to moderate aortic  stenosis is present. Aortic valve mean gradient measures 29.0 mmHg. Aortic valve peak gradient measures 43.3 mmHg. Aortic valve area, by VTI measures 1.96 cm. Pulmonic Valve: The pulmonic valve was grossly normal. Pulmonic valve regurgitation is not visualized. No evidence of pulmonic stenosis. Aorta: Aortic dilatation noted and the aortic root is normal in size and structure. There is mild dilatation of the ascending  aorta, measuring 42 mm. Venous: The inferior vena cava is normal in size with greater than 50% respiratory variability, suggesting right atrial pressure of 3 mmHg. IAS/Shunts: The atrial septum is grossly normal.  LEFT VENTRICLE PLAX 2D LVIDd:         5.40 cm         Diastology LVIDs:         4.30 cm         LV e' lateral:   9.14 cm/s LV PW:         1.20 cm         LV E/e' lateral: 10.6 LV IVS:        1.40 cm LVOT diam:     2.40 cm LV SV:         146             3D Volume EF LV SV Index:   57              LV 3D EF:    Left LVOT Area:     4.52 cm                     ventricular                                             ejection                                             fraction by LV Volumes (MOD)                            3D volume LV vol d, MOD    160.0 ml                   is 52 %. A2C: LV vol d, MOD    172.0 ml A4C:                           3D Volume EF: LV vol s, MOD    77.9 ml       3D EF:        52 % A2C: LV vol s, MOD    75.2 ml A4C: LV SV MOD A2C:   82.1 ml LV SV MOD A4C:   172.0 ml LV SV MOD BP:    90.3 ml RIGHT VENTRICLE             IVC RV S prime:     11.40 cm/s  IVC diam: 2.00 cm TAPSE (M-mode): 2.7 cm LEFT ATRIUM             Index       RIGHT ATRIUM           Index LA diam:        5.10 cm 1.99 cm/m  RA Area:     20.70 cm LA Vol (A2C):   80.6 ml 31.39 ml/m RA Volume:   50.20 ml  19.55 ml/m LA Vol (A4C):   76.7 ml 29.87 ml/m LA Biplane Vol: 78.8 ml 30.69 ml/m  AORTIC VALVE AV Area (Vmax):    1.91 cm AV Area (Vmean):  2.17 cm AV Area (VTI):     1.96 cm AV Vmax:            329.20 cm/s AV Vmean:          231.800 cm/s AV VTI:            0.745 m AV Peak Grad:      43.3 mmHg AV Mean Grad:      29.0 mmHg LVOT Vmax:         139.00 cm/s LVOT Vmean:        111.000 cm/s LVOT VTI:          0.322 m LVOT/AV VTI ratio: 0.43  AORTA Ao Root diam: 3.50 cm MITRAL VALVE MV Area (PHT): 3.87 cm    SHUNTS MV Mean grad:  3.5 mmHg    Systemic VTI:  0.32 m MV Decel Time: 196 msec    Systemic Diam: 2.40 cm MV E velocity: 97.00 cm/s MV A velocity: 89.20 cm/s MV E/A ratio:  1.09 Rudean Haskell MD Electronically signed by Rudean Haskell MD Signature Date/Time: 10/02/2020/5:10:12 PM    Final    VAS US DOPPLER PRE CABG  Result Date: 10/02/2020 PREOPERATIVE VASCULAR EVALUATION Patient Name:  MAXSON ODDO  Date of Exam:   10/02/2020 Medical Rec #: [id number]       Accession #:    [id number] Date of Birth: [birth year]       Patient Gender: M Patient Age:   60Y Exam Location:  [facility] Procedure:      VAS US DOPPLER PRE CABG Referring Phys: [id number] BROADUS Z ATKINS --------------------------------------------------------------------------------  Indications:      Pre-CABG. Risk Factors:     Hypertension, hyperlipidemia. Comparison Study: No prior studies. Performing Technologist: Carlos Levering RVT  Examination Guidelines: A complete evaluation includes B-mode imaging, spectral Doppler, color Doppler, and power Doppler as needed of all accessible portions of each vessel. Bilateral testing is considered an integral part of a complete examination. Limited examinations for reoccurring indications may be performed as noted.  Right Carotid Findings: +----------+--------+--------+--------+-----------------------+--------+           PSV cm/sEDV cm/sStenosisDescribe               Comments +----------+--------+--------+--------+-----------------------+--------+ CCA Prox  101     21              smooth and heterogenous          +----------+--------+--------+--------+-----------------------+--------+ CCA Distal94      22              smooth and heterogenous         +----------+--------+--------+--------+-----------------------+--------+ ICA Prox  79      24              smooth and heterogenous         +----------+--------+--------+--------+-----------------------+--------+ ICA Distal62      18                                     tortuous +----------+--------+--------+--------+-----------------------+--------+ ECA       118     11                                              +----------+--------+--------+--------+-----------------------+--------+ Portions of this table do not appear on this page. +----------+--------+-------+--------+------------+  PSV cm/sEDV cmsDescribeArm Pressure +----------+--------+-------+--------+------------+ Subclavian99                                  +----------+--------+-------+--------+------------+ +---------+--------+--+--------+-+---------+ VertebralPSV cm/s33EDV cm/s8Antegrade +---------+--------+--+--------+-+---------+ Left Carotid Findings: +----------+--------+--------+--------+-----------------------+--------+           PSV cm/sEDV cm/sStenosisDescribe               Comments +----------+--------+--------+--------+-----------------------+--------+ CCA Prox  125     25              smooth and heterogenous         +----------+--------+--------+--------+-----------------------+--------+ CCA Distal87      19              smooth and heterogenous         +----------+--------+--------+--------+-----------------------+--------+ ICA Prox  36      20              smooth and heterogenous         +----------+--------+--------+--------+-----------------------+--------+ ICA Distal59      29                                     tortuous +----------+--------+--------+--------+-----------------------+--------+ ECA       127      15                                              +----------+--------+--------+--------+-----------------------+--------+ +----------+--------+--------+--------+------------+ SubclavianPSV cm/sEDV cm/sDescribeArm Pressure +----------+--------+--------+--------+------------+           179                                  +----------+--------+--------+--------+------------+ +---------+--------+--+--------+--+---------+ VertebralPSV cm/s43EDV cm/s18Antegrade +---------+--------+--+--------+--+---------+  ABI Findings: +--------+------------------+-----+----------+--------+ Right   Rt Pressure (mmHg)IndexWaveform  Comment  +--------+------------------+-----+----------+--------+ [id number]                    triphasic          +--------+------------------+-----+----------+--------+ PTA     88                0.73 monophasic         +--------+------------------+-----+----------+--------+ DP      87                0.72 biphasic           +--------+------------------+-----+----------+--------+ +--------+------------------+-----+---------+-------+ Left    Lt Pressure (mmHg)IndexWaveform Comment +--------+------------------+-----+---------+-------+ Brachial110                    triphasic        +--------+------------------+-----+---------+-------+ PTA     82                0.68 biphasic         +--------+------------------+-----+---------+-------+ DP      65                0.54 biphasic         +--------+------------------+-----+---------+-------+ +-------+---------------+----------------+ ABI/TBIToday's ABI/TBIPrevious ABI/TBI +-------+---------------+----------------+ Right  0.73                            +-------+---------------+----------------+ Left   0.68                            +-------+---------------+----------------+  Right Doppler Findings: +--------+--------+-----+---------+--------+ Site    PressureIndexDoppler  Comments  +--------+--------+-----+---------+--------+ [id number]          triphasic         +--------+--------+-----+---------+--------+ Radial               triphasic         +--------+--------+-----+---------+--------+ Ulnar                triphasic         +--------+--------+-----+---------+--------+  Left Doppler Findings: +--------+--------+-----+---------+--------+ Site    PressureIndexDoppler  Comments +--------+--------+-----+---------+--------+ [id number]          triphasic         +--------+--------+-----+---------+--------+ Radial               triphasic         +--------+--------+-----+---------+--------+ Ulnar                triphasic         +--------+--------+-----+---------+--------+  Summary: Right Carotid: Velocities in the right ICA are consistent with a 1-39% stenosis. Left Carotid: Velocities in the left ICA are consistent with a 1-39% stenosis. Vertebrals: Bilateral vertebral arteries demonstrate antegrade flow. Right ABI: Resting right ankle-brachial index indicates moderate right lower extremity arterial disease. Left ABI: Resting left ankle-brachial index indicates moderate left lower extremity arterial disease. Right Upper Extremity: Doppler waveform obliterate with right radial compression. Doppler waveforms decrease 50% with right ulnar compression. Left Upper Extremity: Doppler waveform obliterate with left radial compression. Doppler waveforms remain within normal limits with left ulnar compression.  Electronically signed by Harold Barban MD on 10/02/2020 at 7:50:07 PM.    Final    VAS [place] LOWER EXTREMITY VENOUS (DVT)  Result Date: 10/16/2020  Lower Venous DVT Study Patient Name:  Keeon OLAOLUWA GRIEDER  Date of Exam:   10/16/2020 Medical Rec #: [id number]       Accession #:    [id number] Date of Birth: 1960-05-28       Patient Gender: M Patient Age:   60Y Exam Location:  [facility] Procedure:      VAS [place] LOWER EXTREMITY VENOUS (DVT) Referring Phys:  [id number] Sharalyn Ink Houa Ackert --------------------------------------------------------------------------------  Indications: Pain, and Swelling.  Limitations: Body habitus, poor ultrasound/tissue interface, bandages and open wound. Comparison Study: 10/11/2020 - RIGHT:                   - There is no evidence of deep vein thrombosis in the lower                   extremity.                   - No cystic structure found in the popliteal fossa.                    LEFT:                   - Findings consistent with chronic deep vein thrombosis                   involving the left popliteal vein.                   - No cystic structure found in the popliteal fossa. Performing Technologist: Oliver Hum RVT  Examination Guidelines: A complete evaluation includes B-mode imaging, spectral Doppler, color Doppler, and power Doppler as needed of all accessible portions of each vessel. Bilateral testing is  considered an integral part of a complete examination. Limited examinations for reoccurring indications may be performed as noted. The reflux portion of the exam is performed with the patient in reverse Trendelenburg.  +---------+---------------+---------+-----------+----------+--------------+ RIGHT    CompressibilityPhasicitySpontaneityPropertiesThrombus Aging +---------+---------------+---------+-----------+----------+--------------+ CFV      Full           Yes      Yes                                 +---------+---------------+---------+-----------+----------+--------------+ SFJ      Full                                                        +---------+---------------+---------+-----------+----------+--------------+ FV Prox  Full                                                        +---------+---------------+---------+-----------+----------+--------------+ FV Mid   Full                                                         +---------+---------------+---------+-----------+----------+--------------+ FV DistalFull                                                        +---------+---------------+---------+-----------+----------+--------------+ PFV      Full                                                        +---------+---------------+---------+-----------+----------+--------------+ POP      Full           Yes      Yes                                 +---------+---------------+---------+-----------+----------+--------------+ PTV      Full                                                        +---------+---------------+---------+-----------+----------+--------------+ PERO     Full                                                        +---------+---------------+---------+-----------+----------+--------------+   +---------+---------------+---------+-----------+----------+--------------+ LEFT     CompressibilityPhasicitySpontaneityPropertiesThrombus Aging +---------+---------------+---------+-----------+----------+--------------+ CFV  Full           Yes      Yes                                 +---------+---------------+---------+-----------+----------+--------------+ SFJ      Full                                                        +---------+---------------+---------+-----------+----------+--------------+ FV Prox  Full                                                        +---------+---------------+---------+-----------+----------+--------------+ FV Mid   Full                                                        +---------+---------------+---------+-----------+----------+--------------+ FV Distal               Yes      Yes                                 +---------+---------------+---------+-----------+----------+--------------+ PFV      Full                                                         +---------+---------------+---------+-----------+----------+--------------+ POP      Partial        Yes      Yes                  Chronic        +---------+---------------+---------+-----------+----------+--------------+ PTV      Full                                                        +---------+---------------+---------+-----------+----------+--------------+ PERO     Full                                                        +---------+---------------+---------+-----------+----------+--------------+     Summary: RIGHT: - There is no evidence of deep vein thrombosis in the lower extremity.  - No cystic structure found in the popliteal fossa.  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 - Findings appear essentially unchanged compared to previous examination. - No cystic structure found in the popliteal fossa.  *See table(s) above for measurements and observations. Electronically signed by  Curt Jews MD on 10/16/2020 at 2:56:45 PM.    Final    VAS [place] LOWER EXTREMITY VENOUS (DVT)  Result Date: 10/12/2020  Lower Venous DVT Study Patient Name:  Sie COLBIN JOVEL  Date of Exam:   10/11/2020 Medical Rec #: [id number]       Accession #:    [id number] Date of Birth: [birth year]       Patient Gender: M Patient Age:   60Y Exam Location:  [facility] Procedure:      VAS [place] LOWER EXTREMITY VENOUS (DVT) Referring Phys: [id number] WAYNE E GOLD --------------------------------------------------------------------------------  Indications: Pain and swelling LT>RT, history of DVT LT, anticoagulation therapy held s/p CABG.  Comparison Study: 12-24-2016 Most recent prior bilateral lower extremity venous                   was positive for DVT involving the LEFT Femoral V and Pop V. Performing Technologist: Darlin Coco RDMS,RVT  Examination Guidelines: A complete evaluation includes B-mode imaging, spectral Doppler, color Doppler, and power Doppler as needed of  all accessible portions of each vessel. Bilateral testing is considered an integral part of a complete examination. Limited examinations for reoccurring indications may be performed as noted. The reflux portion of the exam is performed with the patient in reverse Trendelenburg.  +---------+---------------+---------+-----------+----------+--------------+ RIGHT    CompressibilityPhasicitySpontaneityPropertiesThrombus Aging +---------+---------------+---------+-----------+----------+--------------+ CFV      Full           Yes      Yes                                 +---------+---------------+---------+-----------+----------+--------------+ SFJ      Full                                                        +---------+---------------+---------+-----------+----------+--------------+ FV Prox  Full                                                        +---------+---------------+---------+-----------+----------+--------------+ FV Mid   Full                                                        +---------+---------------+---------+-----------+----------+--------------+ FV DistalFull                                                        +---------+---------------+---------+-----------+----------+--------------+ PFV      Full                                                        +---------+---------------+---------+-----------+----------+--------------+ POP  Full           Yes      Yes                                 +---------+---------------+---------+-----------+----------+--------------+ PTV      Full                                                        +---------+---------------+---------+-----------+----------+--------------+ PERO     Full                                                        +---------+---------------+---------+-----------+----------+--------------+    +---------+---------------+---------+-----------+---------------+--------------+ LEFT     CompressibilityPhasicitySpontaneityProperties     Thrombus Aging +---------+---------------+---------+-----------+---------------+--------------+ CFV      Full           Yes      Yes                                      +---------+---------------+---------+-----------+---------------+--------------+ SFJ      Full                                                             +---------+---------------+---------+-----------+---------------+--------------+ FV Prox  Full                                                             +---------+---------------+---------+-----------+---------------+--------------+ FV Mid   Full                                                             +---------+---------------+---------+-----------+---------------+--------------+ FV DistalFull                                                             +---------+---------------+---------+-----------+---------------+--------------+ PFV      Full                                                             +---------+---------------+---------+-----------+---------------+--------------+ POP      Partial        Yes  Yes        Fibrin         Chronic                                                    stranding                     +---------+---------------+---------+-----------+---------------+--------------+ PTV      Full                                                             +---------+---------------+---------+-----------+---------------+--------------+ PERO     Full                                                             +---------+---------------+---------+-----------+---------------+--------------+     Summary: RIGHT: - There is no evidence of deep vein thrombosis in the lower extremity.  - No cystic structure found in the popliteal fossa.  LEFT: - Findings  consistent with chronic deep vein thrombosis involving the left popliteal vein.  - No cystic structure found in the popliteal fossa.  *See table(s) above for measurements and observations. Electronically signed by Servando Snare MD on 10/12/2020 at 1:37:33 PM.    Final    ECHOCARDIOGRAM LIMITED  Result Date: 10/07/2020    ECHOCARDIOGRAM LIMITED REPORT   Patient Name:   MELQUAN ERNSBERGER Date of Exam: 10/07/2020 Medical Rec #:  [id number]      Height:       75.0 in Accession #:    [id number]     Weight:       312.8 lb Date of Birth:  Nov 03, 1960      BSA:          2.654 m Patient Age:    58 years       BP:           135/113 mmHg Patient Gender: M              HR:           66 bpm. Exam Location:  Inpatient Procedure: Limited Echo, Color Doppler and Intracardiac Opacification Agent Indications:    I50.40* Unspecified combined systolic (congestive) and diastolic                 (congestive) heart failure  History:        Patient has prior history of Echocardiogram examinations, most                 recent 10/02/2020. CAD and Previous Myocardial Infarction, Aortic                 Valve Disease, Signs/Symptoms:Chest Pain; Risk Factors:Sleep                 Apnea and Hypertension.  Sonographer:    Roseanna Rainbow RDCS Referring Phys: [id number] [facility] ATKINS  Sonographer Comments: Technically difficult study due to poor echo windows and patient is  morbidly obese. Image acquisition challenging due to patient body habitus. IMPRESSIONS  1. Left ventricular ejection fraction, by estimation, is 45 to 50%. The left ventricle has mildly decreased function. The left ventricle demonstrates regional wall motion abnormalities (see scoring diagram/findings for description). There is moderate left ventricular hypertrophy. There is incoordinate septal motion. There is moderate hypokinesis/dyskinesis of the left ventricular, mid-apical anterior wall, anteroseptal wall and inferoapical segment.  2. The aortic valve has an indeterminant number of  cusps. Mild to moderate aortic valve stenosis. Aortic valve area, by VTI measures 1.60 cm. Aortic valve mean gradient measures 26.0 mmHg. Aortic valve Vmax measures 3.56 m/s. Comparison(s): Changes from prior study are noted. 10/02/2020: LVEF 50-55%, mild to moderate aortic valve setnosis - mean gradient 29 mmHg. FINDINGS  Left Ventricle: Left ventricular ejection fraction, by estimation, is 45 to 50%. The left ventricle has mildly decreased function. The left ventricle demonstrates regional wall motion abnormalities. Moderate hypokinesis/dyskinesis of the left ventricular, mid-apical anterior wall, anteroseptal wall and inferoapical segment. Definity contrast agent was given IV to delineate the left ventricular endocardial borders. The left ventricular internal cavity size was normal in size. There is moderate  left ventricular hypertrophy. Incoordinate septal motion. Aortic Valve: The aortic valve has an indeterminant number of cusps. Mild to moderate aortic stenosis is present. Aortic valve mean gradient measures 26.0 mmHg. Aortic valve peak gradient measures 50.7 mmHg. Aortic valve area, by VTI measures 1.60 cm. Venous: IVC assessment for right atrial pressure unable to be performed due to mechanical ventilation. LEFT VENTRICLE PLAX 2D LVIDd:         5.30 cm LVIDs:         4.50 cm LV PW:         1.40 cm LV IVS:        1.30 cm LVOT diam:     2.40 cm LV SV:         105 LV SV Index:   40 LVOT Area:     4.52 cm  LV Volumes (MOD) LV vol d, MOD A2C: 185.0 ml LV vol d, MOD A4C: 163.0 ml LV vol s, MOD A2C: 129.0 ml LV vol s, MOD A4C: 66.0 ml LV SV MOD A2C:     56.0 ml LV SV MOD A4C:     163.0 ml LV SV MOD BP:      76.8 ml LEFT ATRIUM         Index LA diam:    4.20 cm 1.58 cm/m  AORTIC VALVE AV Area (Vmax):    1.70 cm AV Area (Vmean):   1.59 cm AV Area (VTI):     1.60 cm AV Vmax:           356.00 cm/s AV Vmean:          230.000 cm/s AV VTI:            0.654 m AV Peak Grad:      50.7 mmHg AV Mean Grad:      26.0  mmHg LVOT Vmax:         134.00 cm/s LVOT Vmean:        80.900 cm/s LVOT VTI:          0.232 m LVOT/AV VTI ratio: 0.35  AORTA Ao Root diam: 4.10 cm MITRAL VALVE MV Area (PHT): 2.45 cm    SHUNTS MV Decel Time: 310 msec    Systemic VTI:  0.23 m MV E velocity: 76.30 cm/s  Systemic Diam: 2.40 cm MV A velocity:  62.10 cm/s MV E/A ratio:  1.23 Lyman Bishop MD Electronically signed by Lyman Bishop MD Signature Date/Time: 10/07/2020/12:52:34 PM    Final    [place] EKG SITE RITE  Result Date: 10/25/2020 If Site Rite image not attached, placement could not be confirmed due to current cardiac rhythm.  [place] EKG SITE RITE  Result Date: 10/23/2020 If Site Rite image not attached, placement could not be confirmed due to current cardiac rhythm.   Discharge Medications: Allergies as of 10/25/2020       Reactions   Levaquin [levofloxacin]    Aortic Aneurysm        Medication List     STOP taking these medications    Entresto 49-51 MG Generic drug: sacubitril-valsartan   furosemide 20 MG tablet Commonly known as: LASIX   LEG CRAMPS PO       TAKE these medications    acetaminophen 325 MG tablet Commonly known as: TYLENOL Take 2 tablets (650 mg total) by mouth every 6 (six) hours as needed for fever. What changed:  medication strength how much to take when to take this reasons to take this   allopurinol 300 MG tablet Commonly known as: ZYLOPRIM TAKE 1 TABLET BY MOUTH EVERY DAY   amiodarone 200 MG tablet Commonly known as: PACERONE Take 1 tablet (200 mg total) by mouth daily.   aspirin 81 MG EC tablet Take 1 tablet (81 mg total) by mouth daily. Swallow whole.   atorvastatin 80 MG tablet Commonly known as: LIPITOR Take 1 tablet (80 mg total) by mouth daily. What changed:  medication strength See the new instructions.   colchicine 0.6 MG tablet Take 0.6 mg by mouth daily as needed (Flair up gout).   ertapenem 1,000 mg in sodium chloride 0.9 % 100 mL Inject 1,000 mg into the vein  daily. Start taking on: October 26, 2020   ferrous [id number] C-folic acid capsule Commonly known as: TRINSICON / FOLTRIN Take 1 capsule by mouth daily.   metoprolol succinate 25 MG 24 hr tablet Commonly known as: TOPROL-XL Take 1 tablet (25 mg total) by mouth daily. Take with or immediately following a meal. What changed:  medication strength how much to take additional instructions   oxyCODONE 5 MG immediate release tablet Commonly known as: Oxy IR/ROXICODONE Take 1 tablet (5 mg total) by mouth every 6 (six) hours as needed for severe pain.   rivaroxaban 20 MG Tabs tablet Commonly known as: Xarelto Take 1 tablet (20 mg total) by mouth daily with supper.   torsemide 20 MG tablet Commonly known as: DEMADEX Take 1 tablet (20 mg total) by mouth daily. Start taking on: October 26, 2020               Discharge Care Instructions  (From admission, onward)           Start     Ordered   10/25/20 0000  Change dressing on IV access line weekly and PRN  (Home infusion instructions - [facility] )        10/25/20 1208           The patient has been discharged on:   1.Beta Blocker:  Yes [  x ]                              No   [   ]  If No, reason:  2.Ace Inhibitor/ARB: Yes [   ]                                     No  [   x ]                                     If No, reason:AKI, labile PB  3.Statin:   Yes [ x  ]                  No  [   ]                  If No, reason:  4.Ecasa:  Yes  [ x  ]                  No   [   ]                  If No, reason:  Patient had ACS upon admission:  Plavix/P2Y12 inhibitor: Yes [   ]                                      No  [  x ] . Patient on Rivaroxaban for chronic DVT, a fib  Follow Up Appointments:  Follow-up Information     Wonda Olds, MD Follow up on 10/31/2020.   Specialty: Cardiothoracic Surgery Why: Appointment time is at 3:30 pm. Contact information: [address] [place] [address] [phone]         [facility] Follow up on 11/08/2020.   Specialty: Cardiology Why: at 2:00 Contact information: [address] [id number] [place]                Signed: [place] 10/25/2020, 12:13 PM

## 2020-10-03 NOTE — Anesthesia Procedure Notes (Signed)
Arterial Line Insertion Start/End6/16/2022 7:00 AM, 10/03/2020 7:15 AM Performed by: Imagene Riches, CRNA, CRNA  Patient location: Pre-op. Preanesthetic checklist: patient identified, IV checked, site marked, risks and benefits discussed, surgical consent, monitors and equipment checked, pre-op evaluation, timeout performed and anesthesia consent Patient sedated Left, radial was placed Catheter size: 20 G Hand hygiene performed , maximum sterile barriers used  and Seldinger technique used Allen's test indicative of satisfactory collateral circulation Attempts: 2 Procedure performed without using ultrasound guided technique. Following insertion, dressing applied and Biopatch. Post procedure assessment: normal  Patient tolerated the procedure well with no immediate complications.

## 2020-10-03 NOTE — Procedures (Signed)
Extubation Procedure Note  Patient Details:   Name: Matthew Savage DOB: [birth year] MRN: [id number]   Airway Documentation:    Vent end date: 10/03/20 Vent end time: 2036   Patient extubated to 4L [place]. Patient able to get -28 on NIF/ and 800 on VC with good effort. Cuff leak present. No stridor noted at this time. Patient tolerated well. Evaluation  O2 sats: stable throughout Complications: No apparent complications Patient did tolerate procedure well. Bilateral Breath Sounds: Clear, Diminished   Yes  Kelle Darting 10/03/2020, 2036
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
## 2020-10-03 NOTE — Anesthesia Preprocedure Evaluation (Signed)
Anesthesia Evaluation  Patient identified by MRN, date of birth, ID band Patient awake    Reviewed: Allergy & Precautions, NPO status , Patient's Chart, lab work & pertinent test results, reviewed documented beta blocker date and time   History of Anesthesia Complications Negative for: history of anesthetic complications  Airway Mallampati: II  TM Distance: >3 FB Neck ROM: Full    Dental  (+) Dental Advisory Given, Teeth Intact   Pulmonary sleep apnea , COPD, former smoker,    breath sounds clear to auscultation       Cardiovascular hypertension, Pt. on home beta blockers and Pt. on medications + CAD, + Past MI and + DVT  + Valvular Problems/Murmurs AS  Rhythm:Regular  1. Left ventricular ejection fraction, by estimation, is 50 to 55%. Left  ventricular ejection fraction by 3D volume is 52 %. The left ventricle has  low normal function. The left ventricle has no regional wall motion  abnormalities. There is moderate  concentric left ventricular hypertrophy. Left ventricular diastolic  parameters are indeterminate.  2. Right ventricular systolic function is normal. The right ventricular  size is normal. Tricuspid regurgitation signal is inadequate for assessing  PA pressure.  3. The mitral valve is grossly normal. No evidence of mitral valve  regurgitation. The mean mitral valve gradient is 3.5 mmHg with average  heart rate of 66 bpm.  4. The aortic valve is calcified. Aortic valve regurgitation is not  visualized. Mild to moderate aortic valve stenosis. Aortic valve mean  gradient measures 29.0 mmHg.  5. Aortic dilatation noted. There is mild dilatation of the ascending  aorta, measuring 42 mm.  6. The inferior vena cava is normal in size with greater than 50%  respiratory variability, suggesting right atrial pressure of 3 mmHg.   ? Prox LAD lesion is 70% stenosed. ? Mid LAD lesion is 80% stenosed. ? Prox Cx lesion is  80% stenosed. ? 1st Mrg-1 lesion is 95% stenosed. ? 1st Mrg-2 lesion is 100% stenosed. ? Mid Cx to Dist Cx lesion is 100% stenosed. ? 2nd Mrg lesion is 80% stenosed. ? Ost RCA lesion is 70% stenosed. ? Prox RCA to Dist RCA lesion is 100% stenosed. ? Lat 2nd Mrg lesion is 80% stenosed.   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  There is extensive collateralization with right to right and some left-to-right collateralization to a large distal RCA supplying PDA and PLA vessels.  Global LV dysfunction with EF estimated 50% with a small region of mild mid inferior hypocontractility.  LVEDP 17 mmHg.    Neuro/Psych negative neurological ROS  negative psych ROS   GI/Hepatic negative GI ROS, Neg liver ROS,   Endo/Other  negative endocrine ROSLab Results      Component                Value               Date                      HGBA1C                   5.6  10/02/2020             Renal/GU Renal InsufficiencyRenal diseaseLab Results      Component                Value               Date                      CREATININE               1.33 (H)            10/03/2020                Musculoskeletal  (+) Arthritis ,   Abdominal   Peds  Hematology  (+) Blood dyscrasia, anemia , Lab Results      Component                Value               Date                      WBC                      6.6                 10/03/2020                HGB                      11.6 (L)            10/03/2020                HCT                      35.8 (L)            10/03/2020                MCV                      100.0               10/03/2020                PLT                      257                  10/03/2020              Anesthesia Other Findings   Reproductive/Obstetrics                             Anesthesia Physical Anesthesia Plan  ASA: 4  Anesthesia Plan: General   Post-op Pain Management:    Induction: Intravenous  PONV Risk Score and Plan: 2 and Treatment may vary due to age or medical condition  Airway Management Planned: Oral ETT  Additional Equipment: Arterial line, CVP, TEE, PA Cath and Ultrasound Guidance Line Placement  Intra-op Plan:   Post-operative Plan: Post-operative intubation/ventilation  Informed Consent: I have reviewed the patients History and Physical, chart, labs and discussed the procedure including the risks, benefits and alternatives for the proposed anesthesia with the patient or authorized representative who has indicated  his/her understanding and acceptance.     Dental advisory given  Plan Discussed with: CRNA and Surgeon  Anesthesia Plan Comments:         Anesthesia Quick Evaluation

## 2020-10-03 NOTE — Discharge Instructions (Addendum)
Discharge Instructions:  1. You may shower, please wash incisions daily with soap and water and keep dry.  If you wish to cover wounds with dressing you may do so but please keep clean and change daily.  No tub baths or swimming until incisions have completely healed.  If your incisions become red or develop any drainage please call our office at [phone]  2. No Driving until cleared by Dr. Atkins' office and you are no longer using narcotic pain medications  3. Monitor your weight daily.. Please use the same scale and weigh at same time... If you gain 5-10 lbs in 48 hours with associated lower extremity swelling, please contact our office at [phone]  4. Fever of 101.5 for at least 24 hours with no source, please contact our office at [phone]  5. Activity- up as tolerated, please walk at least 3 times per day.  Avoid strenuous activity, no lifting, pushing, or pulling with your arms over 8-10 lbs for a minimum of 6 weeks  6. If any questions or concerns arise, please do not hesitate to contact our office at [phone]    Information on my medicine - XARELTO (Rivaroxaban)  Why was Xarelto prescribed for you? Xarelto was prescribed for you to reduce the risk of a blood clot forming that can cause a stroke if you have a medical condition called atrial fibrillation (a type of irregular heartbeat).  What do you need to know about xarelto ? Take your Xarelto ONCE DAILY at the same time every day with your evening meal. If you have difficulty swallowing the tablet whole, you may crush it and mix in applesauce just prior to taking your dose.  Take Xarelto exactly as prescribed by your doctor and DO NOT stop taking Xarelto without talking to the doctor who prescribed the medication.  Stopping without other stroke prevention medication to take the place of Xarelto may increase your risk of developing a clot that causes a stroke.  Refill your prescription before you run  out.  After discharge, you should have regular check-up appointments with your healthcare provider that is prescribing your Xarelto.  In the future your dose may need to be changed if your kidney function or weight changes by a significant amount.  What do you do if you miss a dose? If you are taking Xarelto ONCE DAILY and you miss a dose, take it as soon as you remember on the same day then continue your regularly scheduled once daily regimen the next day. Do not take two doses of Xarelto at the same time or on the same day.   Important Safety Information A possible side effect of Xarelto is bleeding. You should call your healthcare provider right away if you experience any of the following: Bleeding from an injury or your nose that does not stop. Unusual colored urine (red or dark brown) or unusual colored stools (red or black). Unusual bruising for unknown reasons. A serious fall or if you hit your head (even if there is no bleeding).  Some medicines may interact with Xarelto and might increase your risk of bleeding while on Xarelto. To help avoid this, consult your healthcare provider or pharmacist prior to using any new prescription or non-prescription medications, including herbals, vitamins, non-steroidal anti-inflammatory drugs (NSAIDs) and supplements.  This website has more information on Xarelto: www.xarelto.com.  

## 2020-10-03 NOTE — H&P (Signed)
History and Physical Interval Note:  10/03/2020 7:29 AM  Matthew Savage  has presented today for surgery, with the diagnosis of CAD.  The various methods of treatment have been discussed with the patient and family. After consideration of risks, benefits and other options for treatment, the patient has consented to  Procedure(s): CORONARY ARTERY BYPASS GRAFTING (CABG) (N/A) possible AORTIC VALVE REPLACEMENT (AVR) (N/A) MAZE (N/A) TRANSESOPHAGEAL ECHOCARDIOGRAM (TEE) (N/A) INDOCYANINE GREEN FLUORESCENCE IMAGING (ICG) (N/A) as a surgical intervention.  The patient's history has been reviewed, patient examined, no change in status, stable for surgery.  I have reviewed the patient's chart and labs.  Questions were answered to the patient's satisfaction.     Wonda Olds

## 2020-10-03 NOTE — Anesthesia Procedure Notes (Signed)
Procedure Name: Intubation Date/Time: 10/03/2020 7:54 AM Performed by: Imagene Riches, CRNA Pre-anesthesia Checklist: Patient identified, Emergency Drugs available, Suction available and Patient being monitored Patient Re-evaluated:Patient Re-evaluated prior to induction Oxygen Delivery Method: Circle System Utilized Preoxygenation: Pre-oxygenation with 100% oxygen Induction Type: IV induction Ventilation: Mask ventilation without difficulty and Oral airway inserted - appropriate to patient size Laryngoscope Size: Sabra Heck and 1 Grade View: Grade I Tube type: Oral Tube size: 8.0 mm Number of attempts: 1 Airway Equipment and Method: Stylet and Oral airway Placement Confirmation: ETT inserted through vocal cords under direct vision, positive ETCO2 and breath sounds checked- equal and bilateral Secured at: 23 cm Tube secured with: Tape Dental Injury: Teeth and Oropharynx as per pre-operative assessment

## 2020-10-03 NOTE — Progress Notes (Signed)
  Echocardiogram Echocardiogram Transesophageal has been performed.  Matthew Savage 10/03/2020, 10:34 AM

## 2020-10-03 NOTE — Brief Op Note (Addendum)
09/29/2020 - 10/03/2020  1:21 PM  PATIENT:  Matthew Savage  60 y.o. male  PRE-OPERATIVE DIAGNOSIS:  1. S/P NSTEMI 2.CORONARY ARTERY DISEASE  POST-OPERATIVE DIAGNOSIS:  1. S/P NSTEMI 2. CORONARY ARTERY DISEASE  PROCEDURE:  TRANSESOPHAGEAL ECHOCARDIOGRAM (TEE), CORONARY ARTERY BYPASS GRAFTING (CABG) X  4 (LIMA to LAD, SVG SEQUENTIALLY to OM 1 and OM 2, RIMA to PDA) and USING BILATERAL MAMMARY ARTERIES AND RIGHT ENDOSCOPIC GREATER SAPHENOUS VEIN CONDUITS, INDOCYANINE GREEN FLUORESCENCE IMAGING (ICG), CLIPPING OF ATRIAL APPENDAGE, ATRIAL SEPTAL DEFECT (ASD)/PFO  REPAIR WITH PERI GUARD PERICARDIUM, and CORONARY ARTERY ENDARTERCTOMIES (OM2 and PDA) RIGHT EVH HARVEST TIME: 30 minutes;RIGHT EVH PREP TIME: 16 minutes  SURGEON:  Surgeon(s) and Role:    Wonda Olds, MD - Primary  PHYSICIAN ASSISTANT: Lars Pinks PA-C  ASSISTANTS: Dineen Kid RNFA  ANESTHESIA:   general  EBL:  Per perfusion and anesthesia record  DRAINS:  Chest tubes placed in the mediastinal and pleural spaces    LOCAL MEDICATIONS USED:  OTHER Exparel  SPECIMEN:  Source of Specimen:  OM2 and PDA coronary endarterectomies  DISPOSITION OF SPECIMEN:  PATHOLOGY  COUNTS CORRECT:  YES  DICTATION: .Dragon Dictation  PLAN OF CARE: Admit to inpatient   PATIENT DISPOSITION:  ICU - intubated and hemodynamically stable.   Delay start of Pharmacological VTE agent (>24hrs) due to surgical blood loss or risk of bleeding: yes  BASELINE WEIGHT: 131.6 kg  Agree with documentation. Danasia Baker Z. Orvan Seen, [place]

## 2020-10-03 NOTE — Transfer of Care (Signed)
Immediate Anesthesia Transfer of Care Note  Patient: Matthew Savage  Procedure(s) Performed: CORONARY ARTERY BYPASS GRAFTING (CABG) X FOUR, USING BILATERAL MAMMARY ARTERIES AND RIGHT ENDOSCOPIC GREATER SAPHENOUS VEIN CONDUITS (Chest) TRANSESOPHAGEAL ECHOCARDIOGRAM (TEE) INDOCYANINE GREEN FLUORESCENCE IMAGING (ICG) APPLICATION OF CELL SAVER CLIPPING OF ATRIAL APPENDAGE WITH ATRICURE 40 CLIP (Left: Chest) ATRIAL SEPTAL DEFECT (ASD) REPAIR WITH PERI GUARD PERICARDIUM (Chest)  Patient Location: PACU  Anesthesia Type:General  Level of Consciousness: sedated and Patient remains intubated per anesthesia plan  Airway & Oxygen Therapy: Patient remains intubated per anesthesia plan and Patient placed on Ventilator (see vital sign flow sheet for setting)  Post-op Assessment: Report given to RN and Post -op Vital signs reviewed and stable  Post vital signs: Reviewed and stable  Last Vitals:  Vitals Value Taken Time  BP 102/67 10/03/20 1515  Temp 36.9 C 10/03/20 1519  Pulse 85 10/03/20 1519  Resp 15 10/03/20 1519  SpO2 100 % 10/03/20 1519  Vitals shown include unvalidated device data.  Last Pain:  Vitals:   10/03/20 0644  TempSrc: Oral  PainSc:          Complications: No notable events documented.

## 2020-10-03 NOTE — Progress Notes (Signed)
Patient passed SBT and met requirements for NIF/VC. Patient got -25 on NIF and 800 on VC. ABG is not in range at this time. RT will give patient more time and attempt to wean again.

## 2020-10-03 NOTE — Progress Notes (Signed)
Dr Orvan Seen made aware of hyperkalemia, low CI, and UOP of 30cc/hr. MD states to continue to give volume as needed and RN can wean to extubate with CI as they have been trending.

## 2020-10-03 NOTE — Op Note (Signed)
CARDIOTHORACIC SURGERY OPERATIVE NOTE  Date of Procedure: 10/03/2020  Preoperative Diagnosis: Severe 3-vessel Coronary Artery Disease, h/o atrial fib; mild/moderate AS  Postoperative Diagnosis: Same  Procedure:   Coronary Artery Bypass Grafting x 4   Left Internal Mammary Artery to Distal Left Anterior Descending Coronary Artery; pedicled RIMA Graft to Posterior Descending Coronary Artery; Saphenous Vein Graft to 2nd Obtuse Marginal Branch of Left Circumflex Coronary Artery; Saphenous Vein Graft to 1st Obtuse Marginal Branch of Left Circumflex Coronary Artery with coronary endarterectomy; Endoscopic Vein Harvest from Right Thigh and Lower Leg Bilateral internal mammary artery harvesting Completion graft surveillance with indocyanine green fluorescence imaging (SPY) Left atrial appendage clipping Bovine pericardial patch repair of ASD Surgeon: B. Murvin Natal, MD  Assistant: Josie Saunders, PA-C  Anesthesia: get  Operative Findings: Mildly reduced left ventricular systolic function good quality internal mammary artery conduits good quality saphenous vein conduit fair quality target vessels for grafting--diffuse disease  Intraoperative diagnosis of large PFO and ASD Mild-mod transaortic valve gradient  BRIEF CLINICAL NOTE AND INDICATIONS FOR SURGERY  60 yo man with h/o afib s/p ablation presented with chest pain and found to have NSTEMI. He was evaluated by [facility] showing severe multivessel CAD. Additional w/u also showed mild AS; he has been in NSR since atrial ablation. Referred for CABG. Has been evaluated and is considered a good candidate for the procedure for which he now presents.    DETAILS OF THE OPERATIVE PROCEDURE  Preparation:  The patient is brought to the operating room on the above mentioned date and central monitoring was established by the anesthesia team including placement of Swan-Ganz catheter and radial arterial line. The patient is placed in the supine position on  the operating table.  Intravenous antibiotics are administered. General endotracheal anesthesia is induced uneventfully. A Foley catheter is placed.  Baseline transesophageal echocardiogram was performed.  Findings were notable for mild trans valvular gradients on the aortic valve. There is a large PFO/ASD previously undetected.  The patient's chest, abdomen, both groins, and both lower extremities are prepared and draped in a sterile manner. A time out procedure is performed.   Surgical Approach and Conduit Harvest:  A median sternotomy incision was performed and the left internal mammary artery is dissected from the chest wall and prepared for bypass grafting. The left internal mammary artery is notably good quality conduit. Simultaneously, the greater saphenous vein is obtained from the patient's right thigh using endoscopic vein harvest technique. The saphenous vein is notably good quality conduit. After removal of the saphenous vein, the small surgical incisions in the lower extremity are closed with absorbable suture. Attention is turned to the right hemithorax where the RIMA is harvested in a standard fashion. Following systemic heparinization, the internal mammary artery conduits were transected distally noted to have excellent flow. Both IMA pedicles were treated with papaverine solution.  Extracorporeal Cardiopulmonary Bypass and Myocardial Protection:  The pericardium is opened. The ascending aorta is mildly dilated in appearance. The ascending aorta and tSVC/IVC are cannulated for cardiopulmonary bypass.  Adequate heparinization is verified.    A retrograde cardioplegia cannula is placed through the right atrium into the coronary sinus.  The entire pre-bypass portion of the operation was notable for stable hemodynamics.  Cardiopulmonary bypass was begun and the surface of the heart is inspected. Distal target vessels are selected for coronary artery bypass grafting. A cardioplegia  cannula is placed in the ascending aorta.   The patient is allowed to cool passively to 34C systemic temperature.  The  aortic cross clamp is applied and cold blood cardioplegia is delivered initially in an antegrade fashion through the aortic root.   Supplemental cardioplegia is given retrograde through the coronary sinus catheter.  Iced saline slush is applied for topical hypothermia.  The initial cardioplegic arrest is rapid with early diastolic arrest.  Repeat doses of cardioplegia are administered intermittently throughout the entire cross clamp portion of the operation through the aortic root,  through the coronary sinus catheter, and through subsequently placed vein grafts in order to maintain completely flat electrocardiogram.  After myocardial arrest, caval snares are brought down. The right atrial freewall is opened allowing exposure of the ASD. The intra-atrial septum is aneurysmal and is excised, leaving a small rim. The defect is closed with bovine patch repair using a running suture of 5-0 prolene. The right atrium is repaired in layers and caval snares are released. The left atrial appendage is clipped with a Pro-V atrial appendage clip.    Coronary Artery Bypass Grafting:  The  posterior descending branch of the right coronary artery was grafted using the pedicled RIMA graft in an end-to-side fashion.  At the site of distal anastomosis the target vessel was fair quality and measured approximately 1.5 mm in diameter. Anastomotic patency and runoff was confirmed with indocyanine green fluorescence imaging (SPY).  The 2nd obtuse marginal branch of the left circumflex coronary artery was grafted using a reversed saphenous vein graft in an end-to-side fashion.  At the site of distal anastomosis the target vessel was fair quality and measured approximately 1.5 mm in diameter. The 1st obtuse marginal branch of the left circumflex coronary artery was grafted using a reversed saphenous vein graft in  an end-to-side fashion after endarterectomy.  At the site of distal anastomosis the target vessel was fair quality and measured approximately 1.5 mm in diameter. The distal left anterior coronary artery was grafted with the left internal mammary artery in an end-to-side fashion.  At the site of distal anastomosis the target vessel was fair quality and measured approximately 1.5 mm in diameter. Anastomotic patency and runoff was confirmed with indocyanine green fluorescence imaging (SPY).   All proximal vein graft anastomoses were placed directly to the ascending aorta prior to removal of the aortic cross clamp.  Deairing procedures were performed and the aortic cross clamp was removed.   Procedure Completion:  All proximal and distal coronary anastomoses were inspected for hemostasis and appropriate graft orientation. Epicardial pacing wires are fixed to the right ventricular outflow tract and to the right atrial appendage. The patient is rewarmed to 37C temperature. The patient is weaned and disconnected from cardiopulmonary bypass.  The patient's rhythm at separation from bypass was heart block.  The patient was weaned from cardiopulmonary bypass  without any inotropic support.   Followup transesophageal echocardiogram performed after separation from bypass revealed no ASD and good biventricular function.   The aortic and venous cannula were removed uneventfully. Protamine was administered to reverse the anticoagulation. The mediastinum and pleural space were inspected for hemostasis and irrigated with saline solution. The mediastinum and both pleural spaces were drained using fluted chest tubes placed through separate stab incisions inferiorly.  The soft tissues anterior to the aorta were reapproximated loosely. The sternum is closed with double strength sternal wire. The soft tissues anterior to the sternum were closed in multiple layers and the skin is closed with a running subcuticular skin  closure.  The post-bypass portion of the operation was notable for stable rhythm and hemodynamics.  No  blood products were administered during the operation.   Disposition:  The patient tolerated the procedure well and is transported to the surgical intensive care in stable condition. There are no intraoperative complications. All sponge instrument and needle counts are verified correct at completion of the operation.    Valentina Gu. Roxy Manns MD 10/03/2020 5:20 PM

## 2020-10-04 ENCOUNTER — Encounter (HOSPITAL_COMMUNITY): Payer: Self-pay | Admitting: Cardiothoracic Surgery

## 2020-10-04 ENCOUNTER — Inpatient Hospital Stay (HOSPITAL_COMMUNITY): Payer: BC Managed Care – PPO

## 2020-10-04 LAB — CBC
HCT: 28.8 % — ABNORMAL LOW (ref 39.0–52.0)
HCT: 29.2 % — ABNORMAL LOW (ref 39.0–52.0)
Hemoglobin: 9.4 g/dL — ABNORMAL LOW (ref 13.0–17.0)
Hemoglobin: 9.3 g/dL — ABNORMAL LOW (ref 13.0–17.0)
MCH: 32.4 pg (ref 26.0–34.0)
MCH: 32.5 pg (ref 26.0–34.0)
MCHC: 32.6 g/dL (ref 30.0–36.0)
MCHC: 31.8 g/dL (ref 30.0–36.0)
MCV: 99.3 fL (ref 80.0–100.0)
MCV: 102.1 fL — ABNORMAL HIGH (ref 80.0–100.0)
Platelets: 155 10*3/uL (ref 150–400)
Platelets: 193 10*3/uL (ref 150–400)
RBC: 2.9 MIL/uL — ABNORMAL LOW (ref 4.22–5.81)
RBC: 2.86 MIL/uL — ABNORMAL LOW (ref 4.22–5.81)
RDW: 13.9 % (ref 11.5–15.5)
RDW: 14.2 % (ref 11.5–15.5)
WBC: 13 10*3/uL — ABNORMAL HIGH (ref 4.0–10.5)
WBC: 17.4 10*3/uL — ABNORMAL HIGH (ref 4.0–10.5)
nRBC: 0 % (ref 0.0–0.2)
nRBC: 0 % (ref 0.0–0.2)

## 2020-10-04 LAB — GLUCOSE, CAPILLARY
Glucose-Capillary: 100 mg/dL — ABNORMAL HIGH (ref 70–99)
Glucose-Capillary: 107 mg/dL — ABNORMAL HIGH (ref 70–99)
Glucose-Capillary: 109 mg/dL — ABNORMAL HIGH (ref 70–99)
Glucose-Capillary: 109 mg/dL — ABNORMAL HIGH (ref 70–99)
Glucose-Capillary: 113 mg/dL — ABNORMAL HIGH (ref 70–99)
Glucose-Capillary: 115 mg/dL — ABNORMAL HIGH (ref 70–99)
Glucose-Capillary: 120 mg/dL — ABNORMAL HIGH (ref 70–99)
Glucose-Capillary: 126 mg/dL — ABNORMAL HIGH (ref 70–99)
Glucose-Capillary: 127 mg/dL — ABNORMAL HIGH (ref 70–99)
Glucose-Capillary: 129 mg/dL — ABNORMAL HIGH (ref 70–99)
Glucose-Capillary: 142 mg/dL — ABNORMAL HIGH (ref 70–99)
Glucose-Capillary: 142 mg/dL — ABNORMAL HIGH (ref 70–99)

## 2020-10-04 LAB — BASIC METABOLIC PANEL
Anion gap: 10 (ref 5–15)
Anion gap: 8 (ref 5–15)
BUN: 21 mg/dL — ABNORMAL HIGH (ref 6–20)
BUN: 26 mg/dL — ABNORMAL HIGH (ref 6–20)
CO2: 22 mmol/L (ref 22–32)
CO2: 22 mmol/L (ref 22–32)
Calcium: 8.8 mg/dL — ABNORMAL LOW (ref 8.9–10.3)
Calcium: 8.6 mg/dL — ABNORMAL LOW (ref 8.9–10.3)
Chloride: 104 mmol/L (ref 98–111)
Chloride: 103 mmol/L (ref 98–111)
Creatinine, Ser: 1.57 mg/dL — ABNORMAL HIGH (ref 0.61–1.24)
Creatinine, Ser: 2.04 mg/dL — ABNORMAL HIGH (ref 0.61–1.24)
GFR, Estimated: 50 mL/min — ABNORMAL LOW (ref >60)
GFR, Estimated: 37 mL/min — ABNORMAL LOW (ref >60)
Glucose, Bld: 126 mg/dL — ABNORMAL HIGH (ref 70–99)
Glucose, Bld: 132 mg/dL — ABNORMAL HIGH (ref 70–99)
Potassium: 5.4 mmol/L — ABNORMAL HIGH (ref 3.5–5.1)
Potassium: 5.1 mmol/L (ref 3.5–5.1)
Sodium: 136 mmol/L (ref 135–145)
Sodium: 133 mmol/L — ABNORMAL LOW (ref 135–145)

## 2020-10-04 LAB — MAGNESIUM
Magnesium: 2.2 mg/dL (ref 1.7–2.4)
Magnesium: 2.2 mg/dL (ref 1.7–2.4)

## 2020-10-04 LAB — SURGICAL PATHOLOGY

## 2020-10-04 MED ORDER — THIAMINE HCL 100 MG/ML IJ SOLN
Freq: Once | INTRAVENOUS | Status: AC
  Filled 2020-10-04: qty 1000

## 2020-10-04 MED ORDER — LEVALBUTEROL HCL 0.63 MG/3ML IN NEBU
0.6300 mg | INHALATION_SOLUTION | Freq: Two times a day (BID) | RESPIRATORY_TRACT | Status: DC
  Administered 2020-10-05 (×2): 0.63 mg via RESPIRATORY_TRACT
  Filled 2020-10-04 (×2): qty 3

## 2020-10-04 MED ORDER — COLCHICINE 0.3 MG HALF TABLET
0.3000 mg | ORAL_TABLET | Freq: Two times a day (BID) | ORAL | Status: DC
  Administered 2020-10-04 – 2020-10-05 (×3): 0.3 mg via ORAL
  Filled 2020-10-04 (×7): qty 1

## 2020-10-04 MED ORDER — ALBUMIN HUMAN 5 % IV SOLN
12.5000 g | Freq: Once | INTRAVENOUS | Status: AC
  Administered 2020-10-04: 12.5 g via INTRAVENOUS
  Filled 2020-10-04: qty 250

## 2020-10-04 MED ORDER — ISOSORBIDE DINITRATE 10 MG PO TABS
10.0000 mg | ORAL_TABLET | Freq: Three times a day (TID) | ORAL | Status: DC
  Administered 2020-10-04 – 2020-10-24 (×59): 10 mg via ORAL
  Filled 2020-10-04 (×60): qty 1

## 2020-10-04 MED ORDER — FAMOTIDINE 20 MG PO TABS
20.0000 mg | ORAL_TABLET | Freq: Once | ORAL | Status: AC
  Administered 2020-10-04: 20 mg via ORAL
  Filled 2020-10-04: qty 1

## 2020-10-04 MED ORDER — DIAZEPAM 2 MG PO TABS
2.0000 mg | ORAL_TABLET | Freq: Three times a day (TID) | ORAL | Status: AC
  Administered 2020-10-04 – 2020-10-05 (×6): 2 mg via ORAL
  Filled 2020-10-04 (×6): qty 1

## 2020-10-04 MED ORDER — INSULIN ASPART 100 UNIT/ML IJ SOLN
0.0000 [IU] | INTRAMUSCULAR | Status: DC
  Administered 2020-10-04: 2 [IU] via SUBCUTANEOUS

## 2020-10-04 MED ORDER — METOCLOPRAMIDE HCL 5 MG/ML IJ SOLN
10.0000 mg | Freq: Four times a day (QID) | INTRAMUSCULAR | Status: AC
  Administered 2020-10-04 – 2020-10-05 (×4): 10 mg via INTRAVENOUS
  Filled 2020-10-04 (×4): qty 2

## 2020-10-04 MED FILL — Potassium Chloride Inj 2 mEq/ML: INTRAVENOUS | Qty: 40 | Status: AC

## 2020-10-04 MED FILL — Heparin Sodium (Porcine) Inj 1000 Unit/ML: INTRAMUSCULAR | Qty: 30 | Status: AC

## 2020-10-04 MED FILL — Magnesium Sulfate Inj 50%: INTRAMUSCULAR | Qty: 10 | Status: AC

## 2020-10-04 NOTE — Progress Notes (Signed)
1 Day Post-Op Procedure(s) (LRB): CORONARY ARTERY BYPASS GRAFTING (CABG) X FOUR, USING BILATERAL MAMMARY ARTERIES AND RIGHT ENDOSCOPIC GREATER SAPHENOUS VEIN CONDUITS (N/A) TRANSESOPHAGEAL ECHOCARDIOGRAM (TEE) (N/A) INDOCYANINE GREEN FLUORESCENCE IMAGING (ICG) (N/A) APPLICATION OF CELL SAVER (N/A) CLIPPING OF ATRIAL APPENDAGE WITH ATRICURE 40 CLIP (Left) ATRIAL SEPTAL DEFECT (ASD) REPAIR WITH PERI GUARD PERICARDIUM (N/A) Subjective: Mild incisional pain  Objective: Vital signs in last 24 hours: Temp:  [98.6 F (37 C)-100.58 F (38.1 C)] 98.6 F (37 C) (06/17 0700) Pulse Rate:  [85-95] 94 (06/17 0700) Cardiac Rhythm: A-V Sequential paced (06/17 0000) Resp:  [12-31] 12 (06/17 0700) BP: (91-121)/(64-85) 110/76 (06/17 0700) SpO2:  [91 %-100 %] 96 % (06/17 0700) Arterial Line BP: (89-137)/(62-81) 132/74 (06/17 0700) FiO2 (%):  [40 %-50 %] 40 % (06/16 2008) Weight:  [137.3 kg] 137.3 kg (06/17 0500)  Hemodynamic parameters for last 24 hours: PAP: (25-49)/(21-32) 39/22 CO:  [2.9 L/min-7.6 L/min] 7.6 L/min CI:  [1.1 L/min/m2-3 L/min/m2] 3 L/min/m2  Intake/Output from previous day: 06/16 0701 - 06/17 0700 In: 2297.9 [P.O.:250; I.V.:4706.4; Blood:1000; IV Piggyback:951.8] Out: 4610 [Urine:1950; Blood:1800; Chest Tube:860] Intake/Output this shift: No intake/output data recorded.  General appearance: alert and cooperative Neurologic: intact Heart: regular rate and rhythm, S1, S2 normal, no murmur, click, rub or gallop Lungs: clear to auscultation bilaterally Abdomen: soft, non-tender; bowel sounds normal; no masses,  no organomegaly Extremities: edema mild Wound: c/d/i  Lab Results: Recent Labs    10/03/20 2105 10/03/20 2146 10/04/20 0401  WBC 12.5*  --  13.0*  HGB 10.3* 11.2* 9.4*  HCT 31.7* 33.0* 28.8*  PLT 184  --  155   BMET:  Recent Labs    10/03/20 2105 10/03/20 2146 10/04/20 0401  NA 136 137 136  K 5.6* 5.4* 5.4*  CL 110  --  104  CO2 20*  --  22  GLUCOSE  131*  --  126*  BUN 21*  --  21*  CREATININE 1.49*  --  1.57*  CALCIUM 8.5*  --  8.8*    PT/INR:  Recent Labs    10/03/20 1530  LABPROT 16.0*  INR 1.3*   ABG    Component Value Date/Time   PHART 7.280 (L) 10/03/2020 2146   HCO3 20.5 10/03/2020 2146   TCO2 22 10/03/2020 2146   ACIDBASEDEF 6.0 (H) 10/03/2020 2146   O2SAT 97.0 10/03/2020 2146   CBG (last 3)  Recent Labs    10/04/20 0102 10/04/20 0200 10/04/20 0339  GLUCAP 115* 126* 127*    Assessment/Plan: S/P Procedure(s) (LRB): CORONARY ARTERY BYPASS GRAFTING (CABG) X FOUR, USING BILATERAL MAMMARY ARTERIES AND RIGHT ENDOSCOPIC GREATER SAPHENOUS VEIN CONDUITS (N/A) TRANSESOPHAGEAL ECHOCARDIOGRAM (TEE) (N/A) INDOCYANINE GREEN FLUORESCENCE IMAGING (ICG) (N/A) APPLICATION OF CELL SAVER (N/A) CLIPPING OF ATRIAL APPENDAGE WITH ATRICURE 40 CLIP (Left) ATRIAL SEPTAL DEFECT (ASD) REPAIR WITH PERI GUARD PERICARDIUM (N/A) Mobilize Pt/ot D/c pa cath, art line BP control, pain control Gentle resuscitation with elevated sCr   LOS: 4 days    Matthew Savage 10/04/2020

## 2020-10-04 NOTE — Progress Notes (Signed)
T[facility] BRIEF SICU PROGRESS NOTE  1 Day Post-Op  S/P Procedure(s) (LRB): CORONARY ARTERY BYPASS GRAFTING (CABG) X FOUR, USING BILATERAL MAMMARY ARTERIES AND RIGHT ENDOSCOPIC GREATER SAPHENOUS VEIN CONDUITS (N/A) TRANSESOPHAGEAL ECHOCARDIOGRAM (TEE) (N/A) INDOCYANINE GREEN FLUORESCENCE IMAGING (ICG) (N/A) APPLICATION OF CELL SAVER (N/A) CLIPPING OF ATRIAL APPENDAGE WITH ATRICURE 40 CLIP (Left) ATRIAL SEPTAL DEFECT (ASD) REPAIR WITH PERI GUARD PERICARDIUM (N/A)   Stable day Complaining of nausea  AAI paced w/ stable BP Breathing comfortably on room air UOP adequate  Plan: Continue current plan  Rexene Alberts, MD 10/04/2020 6:06 PM

## 2020-10-04 NOTE — Progress Notes (Signed)
Pt has his own home CPAP. 4 L of 02 bleed in.

## 2020-10-04 NOTE — Progress Notes (Signed)
RT set up patient CPAP at beside. 2L O2 bleed in needed. Patient is currently on CPAP at this time.

## 2020-10-05 ENCOUNTER — Inpatient Hospital Stay (HOSPITAL_COMMUNITY): Payer: BC Managed Care – PPO

## 2020-10-05 LAB — GLUCOSE, CAPILLARY
Glucose-Capillary: 117 mg/dL — ABNORMAL HIGH (ref 70–99)
Glucose-Capillary: 112 mg/dL — ABNORMAL HIGH (ref 70–99)
Glucose-Capillary: 108 mg/dL — ABNORMAL HIGH (ref 70–99)
Glucose-Capillary: 115 mg/dL — ABNORMAL HIGH (ref 70–99)

## 2020-10-05 LAB — CBC
HCT: 29.2 % — ABNORMAL LOW (ref 39.0–52.0)
Hemoglobin: 9 g/dL — ABNORMAL LOW (ref 13.0–17.0)
MCH: 32.3 pg (ref 26.0–34.0)
MCHC: 30.8 g/dL (ref 30.0–36.0)
MCV: 104.7 fL — ABNORMAL HIGH (ref 80.0–100.0)
Platelets: 195 10*3/uL (ref 150–400)
RBC: 2.79 MIL/uL — ABNORMAL LOW (ref 4.22–5.81)
RDW: 14.4 % (ref 11.5–15.5)
WBC: 20.6 10*3/uL — ABNORMAL HIGH (ref 4.0–10.5)
nRBC: 0 % (ref 0.0–0.2)

## 2020-10-05 LAB — BASIC METABOLIC PANEL
Anion gap: 9 (ref 5–15)
BUN: 29 mg/dL — ABNORMAL HIGH (ref 6–20)
CO2: 23 mmol/L (ref 22–32)
Calcium: 8.7 mg/dL — ABNORMAL LOW (ref 8.9–10.3)
Chloride: 100 mmol/L (ref 98–111)
Creatinine, Ser: 2.36 mg/dL — ABNORMAL HIGH (ref 0.61–1.24)
GFR, Estimated: 31 mL/min — ABNORMAL LOW (ref >60)
Glucose, Bld: 114 mg/dL — ABNORMAL HIGH (ref 70–99)
Potassium: 5.1 mmol/L (ref 3.5–5.1)
Sodium: 132 mmol/L — ABNORMAL LOW (ref 135–145)

## 2020-10-05 MED ORDER — ALBUMIN HUMAN 5 % IV SOLN: 12.5000 g | Freq: Once | INTRAVENOUS | Status: AC

## 2020-10-05 MED ORDER — ~~LOC~~ CARDIAC SURGERY, PATIENT & FAMILY EDUCATION: Freq: Once | Status: AC

## 2020-10-05 MED ORDER — INSULIN ASPART 100 UNIT/ML IJ SOLN
0.0000 [IU] | Freq: Three times a day (TID) | INTRAMUSCULAR | Status: DC
Start: 2020-10-05 — End: 2020-10-08
  Administered 2020-10-07 (×2): 2 [IU] via SUBCUTANEOUS

## 2020-10-05 MED ORDER — FUROSEMIDE 10 MG/ML IJ SOLN
40.0000 mg | Freq: Two times a day (BID) | INTRAMUSCULAR | Status: DC
  Administered 2020-10-05 – 2020-10-06 (×2): 40 mg via INTRAVENOUS
  Filled 2020-10-05 (×2): qty 4

## 2020-10-05 MED ORDER — ALBUMIN HUMAN 5 % IV SOLN
INTRAVENOUS | Status: AC
  Administered 2020-10-05: 12.5 g via INTRAVENOUS
  Filled 2020-10-05: qty 250

## 2020-10-05 NOTE — Progress Notes (Addendum)
TCTS BRIEF SICU PROGRESS NOTE  2 Days Post-Op  S/P Procedure(s) (LRB): CORONARY ARTERY BYPASS GRAFTING (CABG) X FOUR, USING BILATERAL MAMMARY ARTERIES AND RIGHT ENDOSCOPIC GREATER SAPHENOUS VEIN CONDUITS (N/A) TRANSESOPHAGEAL ECHOCARDIOGRAM (TEE) (N/A) INDOCYANINE GREEN FLUORESCENCE IMAGING (ICG) (N/A) APPLICATION OF CELL SAVER (N/A) CLIPPING OF ATRIAL APPENDAGE WITH ATRICURE 40 CLIP (Left) ATRIAL SEPTAL DEFECT (ASD) REPAIR WITH PERI GUARD PERICARDIUM (N/A)   Stable day although UOP marginal  Plan: Continue current plan.  Will give albumin as volume challenge and leave Foley cath in place  Rexene Alberts, MD 10/05/2020 5:27 PM

## 2020-10-05 NOTE — Evaluation (Signed)
Physical Therapy Evaluation Patient Details Name: Matthew Savage MRN: [id number] DOB: 03-10-1961 Today's Date: 10/05/2020   History of Present Illness  Pt is a 60 y/o male presenting on 6/16 for CABG with TEE, and ICG with diagnosis of CAD. Pt extubated 6/16. PMH includes: hematuria, class II obesity, DDD, chronic DVT, gout, heart murmur, hyperlipidemia, hypertension, lap band surgery, numbness and tingling, SCC of face, mild AVS, TAA, atrial fibrillation/typical atrial flutter with ablation, CAD multiple MIs.  Clinical Impression  Pt requires reminders to maintain sternal precautions this session. Pt demonstrates deficits in functional mobility, gait, activity tolerance, and safety awareness. Pt tolerates ambulation of household distances with eva walker, requiring physical assistance to maintain proximity to device and cues to slow gait for safety. Pt will benefit from acute PT to improve safety and independence in mobility. SPT recommends SNF placement to provide physical assist pt currently requires and to aid in return to prior level.     Follow Up Recommendations SNF    Equipment Recommendations  Rolling walker with 5" wheels;3in1 (PT);Wheelchair (measurements PT);Wheelchair cushion (measurements PT)    Recommendations for Other Services       Precautions / Restrictions Precautions Precautions: Fall;Sternal;ICD/Pacemaker (external pacemaker) Restrictions Weight Bearing Restrictions:  (sternal rpecautions)      Mobility  Bed Mobility Overal bed mobility: Needs Assistance Bed Mobility: Supine to Sit     Supine to sit: Min assist     General bed mobility comments: min A for trunk    Transfers Overall transfer level: Needs assistance Equipment used:  (evo walker) Transfers: Sit to/from Stand Sit to Stand: Min guard         General transfer comment: min G for safety and cues for rocking and use of momentum to power to stand.  Ambulation/Gait Ambulation/Gait  assistance: Min assist Gait Distance (Feet): 120 Feet Assistive device:  (eva walker) Gait Pattern/deviations: Wide base of support;Step-through pattern;Decreased stride length;Staggering right;Drifts right/left Gait velocity: WNL Gait velocity interpretation: >4.37 ft/sec, indicative of normal walking speed General Gait Details: Pt requires min A to manage walker and multiple cues for proximity to AD. Pt has a tendency to push evo walker in front of him and requires cues to slow down.  Stairs            Wheelchair Mobility    Modified Rankin (Stroke Patients Only)       Balance Overall balance assessment: Needs assistance Sitting-balance support: Feet supported Sitting balance-Leahy Scale: Fair     Standing balance support: During functional activity Standing balance-Leahy Scale: Poor Standing balance comment: Pt reliant on UE support                             Pertinent Vitals/Pain Pain Assessment: No/denies pain    Home Living Family/patient expects to be discharged to:: Private residence Living Arrangements: Alone Available Help at Discharge:  (none) Type of Home: House Home Access: Stairs to enter Entrance Stairs-Rails: Can reach both Entrance Stairs-Number of Steps:  (clarify with pt.) Home Layout: One level Home Equipment: None      Prior Function Level of Independence: Independent               Hand Dominance        Extremity/Trunk Assessment   Upper Extremity Assessment Upper Extremity Assessment: Overall WFL for tasks assessed    Lower Extremity Assessment Lower Extremity Assessment: Generalized weakness       Communication   Communication:  No difficulties  Cognition Arousal/Alertness: Lethargic Behavior During Therapy: WFL for tasks assessed/performed Overall Cognitive Status: Within Functional Limits for tasks assessed                                        General Comments General comments (skin  integrity, edema, etc.): Pt initially on 4 L satting at 97-98% at rest. Pt weaned to 2 L with sats at 95% during mobility.    Exercises     Assessment/Plan    PT Assessment Patient needs continued PT services  PT Problem List Decreased strength;Decreased activity tolerance;Decreased balance;Decreased mobility;Decreased knowledge of use of DME;Decreased safety awareness;Decreased knowledge of precautions       PT Treatment Interventions DME instruction;Gait training;Stair training;Functional mobility training;Therapeutic activities;Therapeutic exercise;Patient/family education    PT Goals (Current goals can be found in the Care Plan section)  Acute Rehab PT Goals Patient Stated Goal: get stronger PT Goal Formulation: With patient Time For Goal Achievement: 10/19/20 Potential to Achieve Goals: Good    Frequency Min 2X/week   Barriers to discharge        Co-evaluation               AM-PAC PT "6 Clicks" Mobility  Outcome Measure Help needed turning from your back to your side while in a flat bed without using bedrails?: A Little Help needed moving from lying on your back to sitting on the side of a flat bed without using bedrails?: A Little Help needed moving to and from a bed to a chair (including a wheelchair)?: A Little Help needed standing up from a chair using your arms (e.g., wheelchair or bedside chair)?: A Little Help needed to walk in hospital room?: A Little Help needed climbing 3-5 steps with a railing? : A Lot 6 Click Score: 17    End of Session Equipment Utilized During Treatment: Gait belt;Oxygen Activity Tolerance: Patient limited by fatigue Patient left: in chair;with call bell/phone within reach Nurse Communication: Mobility status;Other (comment) (o2 sats) PT Visit Diagnosis: Unsteadiness on feet (R26.81);Other abnormalities of gait and mobility (R26.89);Muscle weakness (generalized) (M62.81);Difficulty in walking, not elsewhere classified (R26.2)     Time: 7322-0254 PT Time Calculation (min) (ACUTE ONLY): 39 min   Charges:   PT Evaluation $PT Eval Low Complexity: 1 Low PT Treatments $Gait Training: 8-22 mins        Acute Rehab  Pager: [phone]   Garwin Brothers, SPT 10/05/2020, 5:38 PM

## 2020-10-05 NOTE — Progress Notes (Signed)
PT Cancellation Note  Patient Details Name: ISTVAN BEHAR MRN: [id number] DOB: 1960/06/12   Cancelled Treatment:    Reason Eval/Treat Not Completed: Fatigue/lethargy limiting ability to participate. PT attempted to see pt twice for evaluation on this date, upon initial attempt pt recently returned to bed, attempting to rest. Upon 2nd attempt RN reports RT recently applied CPAP, pt finally sleeping. PT will attempt to follow up at a later time.   Zenaida Niece 10/05/2020, 2:26 PM

## 2020-10-05 NOTE — Progress Notes (Signed)
[address]       [place] [address]             [phone]        CARDIOTHORACIC SURGERY PROGRESS NOTE   R2 Days Post-Op Procedure(s) (LRB): CORONARY ARTERY BYPASS GRAFTING (CABG) X FOUR, USING BILATERAL MAMMARY ARTERIES AND RIGHT ENDOSCOPIC GREATER SAPHENOUS VEIN CONDUITS (N/A) TRANSESOPHAGEAL ECHOCARDIOGRAM (TEE) (N/A) INDOCYANINE GREEN FLUORESCENCE IMAGING (ICG) (N/A) APPLICATION OF CELL SAVER (N/A) CLIPPING OF ATRIAL APPENDAGE WITH ATRICURE 40 CLIP (Left) ATRIAL SEPTAL DEFECT (ASD) REPAIR WITH PERI GUARD PERICARDIUM (N/A)  Subjective: Feels sore in chest.  Otherwise okay  Objective: Vital signs: BP Readings from Last 1 Encounters:  10/05/20 128/70   Pulse Readings from Last 1 Encounters:  10/05/20 90   Resp Readings from Last 1 Encounters:  10/05/20 14   Temp Readings from Last 1 Encounters:  10/05/20 97.7 F (36.5 C)    Hemodynamics:    Physical Exam:  Rhythm:   Sinus w/ PAC's  Breath sounds: Diminished at bases  Heart sounds:  RRR  Incisions:  Dressing dry, intact  Abdomen:  Soft, non-distended, non-tender  Extremities:  Warm, well-perfused  Chest tubes:  low volume thin serosanguinous output, no air leak   Intake/Output from previous day: 06/17 0701 - 06/18 0700 In: 1771.1 [P.O.:1350; I.V.:35.4; IV Piggyback:385.7] Out: 1355 [Urine:875; Chest Tube:480] Intake/Output this shift: Total I/O In: 220 [P.O.:120; IV Piggyback:100] Out: 210 [Urine:130; Chest Tube:80]  Lab Results:  CBC: Recent Labs    10/04/20 1708 10/05/20 0215  WBC 17.4* 20.6*  HGB 9.3* 9.0*  HCT 29.2* 29.2*  PLT 193 195    BMET:  Recent Labs    10/04/20 1708 10/05/20 0215  NA 133* 132*  K 5.1 5.1  CL 103 100  CO2 22 23  GLUCOSE 132* 114*  BUN 26* 29*  CREATININE 2.04* 2.36*  CALCIUM 8.6* 8.7*     PT/INR:   Recent Labs    10/03/20 1530  LABPROT 16.0*  INR 1.3*    CBG (last 3)  Recent Labs    10/04/20 2008 10/04/20 2356  10/05/20 0748  GLUCAP 107* 100* 117*    ABG    Component Value Date/Time   PHART 7.280 (L) 10/03/2020 2146   PCO2ART 44.1 10/03/2020 2146   PO2ART 103 10/03/2020 2146   HCO3 20.5 10/03/2020 2146   TCO2 22 10/03/2020 2146   ACIDBASEDEF 6.0 (H) 10/03/2020 2146   O2SAT 97.0 10/03/2020 2146    CXR: PORTABLE CHEST 1 VIEW   COMPARISON:  Chest radiograph 10/04/2020.   FINDINGS: Interval retraction PA catheter sheath. Monitoring leads stable right IJ overlie the patient. Chest tubes and mediastinal drain remain in position. Stable cardiac and mediastinal contours. Low lung volumes. Similar bilateral patchy airspace opacities. No pleural effusion or pneumothorax. Probable gaseous distended colon left upper quadrant.   IMPRESSION: Interval retraction PA catheter.   Similar-appearing bilateral atelectasis.   Probable gaseous distended colon left upper quadrant. Consider correlation with dedicated abdominal radiograph.     Electronically Signed   By: Lovey Newcomer M.D.   On: 10/05/2020 09:46  Assessment/Plan: S/P Procedure(s) (LRB): CORONARY ARTERY BYPASS GRAFTING (CABG) X FOUR, USING BILATERAL MAMMARY ARTERIES AND RIGHT ENDOSCOPIC GREATER SAPHENOUS VEIN CONDUITS (N/A) TRANSESOPHAGEAL ECHOCARDIOGRAM (TEE) (N/A) INDOCYANINE GREEN FLUORESCENCE IMAGING (ICG) (N/A) APPLICATION OF CELL SAVER (N/A) CLIPPING OF ATRIAL APPENDAGE WITH ATRICURE 40 CLIP (Left) ATRIAL SEPTAL DEFECT (ASD) REPAIR WITH PERI GUARD PERICARDIUM (N/A)  Overall stable POD2 Maintaining NSR w/  stable BP off all drips Breathing comfortably w/ O2 sats >90% on 4 L/min via [place] Expected post op acute blood loss anemia, mild, stable Expected post op atelectasis, mild Post op elevated serum creatinine - acute exacerbation of baseline CKD, likely due to prerenal azotemia +/- acute kidney injury caused by ATN, UOP adequate Obesity w/ OSA on CPAP Mild abdominal distension w/out pain  Mobilize D/C chest tubes Watch  renal function Lasix to stimulate diuresis Watch abdominal exam and check KUB    Rexene Alberts, MD 10/05/2020 11:31 AM

## 2020-10-05 NOTE — Progress Notes (Signed)
Pt has home CPAP.  

## 2020-10-05 NOTE — Progress Notes (Signed)
Pt UOP marginal. 1800 IV lasix given at 1600 to assist. Only 83ml drained in 1.5hours. Bladder scan shows 54mL. MD Roxy Manns made aware. Orders to continue Foley and give one 5% Albumin.

## 2020-10-06 ENCOUNTER — Inpatient Hospital Stay (HOSPITAL_COMMUNITY): Payer: BC Managed Care – PPO

## 2020-10-06 LAB — CBC
HCT: 27 % — ABNORMAL LOW (ref 39.0–52.0)
Hemoglobin: 8.6 g/dL — ABNORMAL LOW (ref 13.0–17.0)
MCH: 32.2 pg (ref 26.0–34.0)
MCHC: 31.9 g/dL (ref 30.0–36.0)
MCV: 101.1 fL — ABNORMAL HIGH (ref 80.0–100.0)
Platelets: 200 10*3/uL (ref 150–400)
RBC: 2.67 MIL/uL — ABNORMAL LOW (ref 4.22–5.81)
RDW: 14.3 % (ref 11.5–15.5)
WBC: 17.2 10*3/uL — ABNORMAL HIGH (ref 4.0–10.5)
nRBC: 0 % (ref 0.0–0.2)

## 2020-10-06 LAB — BASIC METABOLIC PANEL
Anion gap: 12 (ref 5–15)
BUN: 37 mg/dL — ABNORMAL HIGH (ref 6–20)
CO2: 21 mmol/L — ABNORMAL LOW (ref 22–32)
Calcium: 8.9 mg/dL (ref 8.9–10.3)
Chloride: 96 mmol/L — ABNORMAL LOW (ref 98–111)
Creatinine, Ser: 2.99 mg/dL — ABNORMAL HIGH (ref 0.61–1.24)
GFR, Estimated: 23 mL/min — ABNORMAL LOW (ref >60)
Glucose, Bld: 113 mg/dL — ABNORMAL HIGH (ref 70–99)
Potassium: 5 mmol/L (ref 3.5–5.1)
Sodium: 129 mmol/L — ABNORMAL LOW (ref 135–145)

## 2020-10-06 LAB — GLUCOSE, CAPILLARY
Glucose-Capillary: 109 mg/dL — ABNORMAL HIGH (ref 70–99)
Glucose-Capillary: 109 mg/dL — ABNORMAL HIGH (ref 70–99)
Glucose-Capillary: 103 mg/dL — ABNORMAL HIGH (ref 70–99)
Glucose-Capillary: 85 mg/dL (ref 70–99)

## 2020-10-06 MED ORDER — LEVALBUTEROL HCL 0.63 MG/3ML IN NEBU: 0.6300 mg | INHALATION_SOLUTION | Freq: Four times a day (QID) | RESPIRATORY_TRACT | Status: DC | PRN

## 2020-10-06 MED ORDER — LEVALBUTEROL HCL 0.63 MG/3ML IN NEBU
0.6300 mg | INHALATION_SOLUTION | Freq: Four times a day (QID) | RESPIRATORY_TRACT | Status: DC
  Administered 2020-10-06: 0.63 mg via RESPIRATORY_TRACT
  Filled 2020-10-06: qty 3

## 2020-10-06 MED ORDER — FUROSEMIDE 10 MG/ML IJ SOLN
80.0000 mg | Freq: Two times a day (BID) | INTRAMUSCULAR | Status: DC
  Administered 2020-10-06: 80 mg via INTRAVENOUS
  Filled 2020-10-06 (×2): qty 8

## 2020-10-06 NOTE — Progress Notes (Addendum)
[address]       [place] [address]             [phone]        CARDIOTHORACIC SURGERY PROGRESS NOTE   R3 Days Post-Op Procedure(s) (LRB): CORONARY ARTERY BYPASS GRAFTING (CABG) X FOUR, USING BILATERAL MAMMARY ARTERIES AND RIGHT ENDOSCOPIC GREATER SAPHENOUS VEIN CONDUITS (N/A) TRANSESOPHAGEAL ECHOCARDIOGRAM (TEE) (N/A) INDOCYANINE GREEN FLUORESCENCE IMAGING (ICG) (N/A) APPLICATION OF CELL SAVER (N/A) CLIPPING OF ATRIAL APPENDAGE WITH ATRICURE 40 CLIP (Left) ATRIAL SEPTAL DEFECT (ASD) REPAIR WITH PERI GUARD PERICARDIUM (N/A)  Subjective: Looks okay.  No specific complaints.  No SOB and mild soreness in chest.  Mild nausea w/out abdominal pain.  + flatus but no BM yet.    Objective: Vital signs: BP Readings from Last 1 Encounters:  10/06/20 140/80   Pulse Readings from Last 1 Encounters:  10/06/20 80   Resp Readings from Last 1 Encounters:  10/06/20 (!) 7   Temp Readings from Last 1 Encounters:  10/06/20 98.7 F (37.1 C)    Hemodynamics:    Physical Exam:  Rhythm:   Sinus w/ PAC's  Breath sounds: Clear, diminished at bases  Heart sounds:  RRR  Incisions:  Clean and dry  Abdomen:  Mildly distended but soft, non tender  Extremities:  Warm, well-perfused    Intake/Output from previous day: 06/18 0701 - 06/19 0700 In: 1064.8 [P.O.:635; IV Piggyback:429.8] Out: 990 [Urine:910; Chest Tube:80] Intake/Output this shift: Total I/O In: 480 [P.O.:480] Out: 200 [Urine:200]  Lab Results:  CBC: Recent Labs    10/05/20 0215 10/06/20 0229  WBC 20.6* 17.2*  HGB 9.0* 8.6*  HCT 29.2* 27.0*  PLT 195 200    BMET:  Recent Labs    10/05/20 0215 10/06/20 0229  NA 132* 129*  K 5.1 5.0  CL 100 96*  CO2 23 21*  GLUCOSE 114* 113*  BUN 29* 37*  CREATININE 2.36* 2.99*  CALCIUM 8.7* 8.9     PT/INR:   Recent Labs    10/03/20 1530  LABPROT 16.0*  INR 1.3*    CBG (last 3)  Recent Labs    10/05/20 1543 10/05/20 2155  10/06/20 0659  GLUCAP 108* 115* 109*    ABG    Component Value Date/Time   PHART 7.280 (L) 10/03/2020 2146   PCO2ART 44.1 10/03/2020 2146   PO2ART 103 10/03/2020 2146   HCO3 20.5 10/03/2020 2146   TCO2 22 10/03/2020 2146   ACIDBASEDEF 6.0 (H) 10/03/2020 2146   O2SAT 97.0 10/03/2020 2146    CXR: pending  Assessment/Plan: S/P Procedure(s) (LRB): CORONARY ARTERY BYPASS GRAFTING (CABG) X FOUR, USING BILATERAL MAMMARY ARTERIES AND RIGHT ENDOSCOPIC GREATER SAPHENOUS VEIN CONDUITS (N/A) TRANSESOPHAGEAL ECHOCARDIOGRAM (TEE) (N/A) INDOCYANINE GREEN FLUORESCENCE IMAGING (ICG) (N/A) APPLICATION OF CELL SAVER (N/A) CLIPPING OF ATRIAL APPENDAGE WITH ATRICURE 40 CLIP (Left) ATRIAL SEPTAL DEFECT (ASD) REPAIR WITH PERI GUARD PERICARDIUM (N/A)  Overall stable POD3 Maintaining NSR w/ stable BP off all drips Breathing comfortably w/ O2 sats >99-100%% on 5 L/min via [place] Expected post op acute blood loss anemia, mild, stable Expected post op atelectasis, mild Post op elevated serum creatinine - acute exacerbation of baseline CKD, likely due to prerenal azotemia +/- acute kidney injury caused by ATN, creatinine up further 2.99 but UOP adequate Leukocytosis w/out fever, likely reactive, trending down Obesity w/ OSA on CPAP Mild abdominal distension w/out pain, possible ileus   Mobilize Check f/u KUB and CXR which hasn't been done Watch renal  function and increase Lasix to stimulate diuresis Keep in ICU until renal function improves Hold off on advancing diet   Rexene Alberts, MD 10/06/2020 9:56 AM

## 2020-10-06 NOTE — Anesthesia Postprocedure Evaluation (Signed)
Anesthesia Post Note  Patient: Matthew Savage  Procedure(s) Performed: CORONARY ARTERY BYPASS GRAFTING (CABG) X FOUR, USING BILATERAL MAMMARY ARTERIES AND RIGHT ENDOSCOPIC GREATER SAPHENOUS VEIN CONDUITS (Chest) TRANSESOPHAGEAL ECHOCARDIOGRAM (TEE) INDOCYANINE GREEN FLUORESCENCE IMAGING (ICG) APPLICATION OF CELL SAVER CLIPPING OF ATRIAL APPENDAGE WITH ATRICURE 40 CLIP (Left: Chest) ATRIAL SEPTAL DEFECT (ASD) REPAIR WITH PERI GUARD PERICARDIUM (Chest)     Patient location during evaluation: PACU Anesthesia Type: General Level of consciousness: awake and alert Pain management: pain level controlled Vital Signs Assessment: post-procedure vital signs reviewed and stable Respiratory status: spontaneous breathing, nonlabored ventilation, respiratory function stable and patient connected to nasal cannula oxygen Cardiovascular status: blood pressure returned to baseline and stable Postop Assessment: no apparent nausea or vomiting Anesthetic complications: no   No notable events documented.  Last Vitals:  Vitals:   10/06/20 1600 10/06/20 1630  BP: 100/88 106/68  Pulse: 76 76  Resp: (!) 9 12  Temp:    SpO2: 100% 100%    Last Pain:  Vitals:   10/06/20 1609  TempSrc:   PainSc: 3                  Burnie Therien

## 2020-10-06 NOTE — Plan of Care (Signed)

## 2020-10-07 ENCOUNTER — Inpatient Hospital Stay (HOSPITAL_COMMUNITY): Payer: BC Managed Care – PPO

## 2020-10-07 ENCOUNTER — Ambulatory Visit: Payer: BC Managed Care – PPO | Admitting: Internal Medicine

## 2020-10-07 DIAGNOSIS — I5040 Unspecified combined systolic (congestive) and diastolic (congestive) heart failure: Secondary | ICD-10-CM

## 2020-10-07 LAB — COMPREHENSIVE METABOLIC PANEL
ALT: 8 U/L (ref 0–44)
ALT: 9 U/L (ref 0–44)
AST: 23 U/L (ref 15–41)
AST: 23 U/L (ref 15–41)
Albumin: 2.9 g/dL — ABNORMAL LOW (ref 3.5–5.0)
Albumin: 2.8 g/dL — ABNORMAL LOW (ref 3.5–5.0)
Alkaline Phosphatase: 65 U/L (ref 38–126)
Alkaline Phosphatase: 66 U/L (ref 38–126)
Anion gap: 13 (ref 5–15)
Anion gap: 8 (ref 5–15)
BUN: 48 mg/dL — ABNORMAL HIGH (ref 6–20)
BUN: 53 mg/dL — ABNORMAL HIGH (ref 6–20)
CO2: 21 mmol/L — ABNORMAL LOW (ref 22–32)
CO2: 23 mmol/L (ref 22–32)
Calcium: 8.9 mg/dL (ref 8.9–10.3)
Calcium: 8.4 mg/dL — ABNORMAL LOW (ref 8.9–10.3)
Chloride: 95 mmol/L — ABNORMAL LOW (ref 98–111)
Chloride: 94 mmol/L — ABNORMAL LOW (ref 98–111)
Creatinine, Ser: 2.73 mg/dL — ABNORMAL HIGH (ref 0.61–1.24)
Creatinine, Ser: 2.77 mg/dL — ABNORMAL HIGH (ref 0.61–1.24)
GFR, Estimated: 26 mL/min — ABNORMAL LOW (ref >60)
GFR, Estimated: 25 mL/min — ABNORMAL LOW (ref >60)
Glucose, Bld: 112 mg/dL — ABNORMAL HIGH (ref 70–99)
Glucose, Bld: 106 mg/dL — ABNORMAL HIGH (ref 70–99)
Potassium: 4.9 mmol/L (ref 3.5–5.1)
Potassium: 4.6 mmol/L (ref 3.5–5.1)
Sodium: 129 mmol/L — ABNORMAL LOW (ref 135–145)
Sodium: 125 mmol/L — ABNORMAL LOW (ref 135–145)
Total Bilirubin: 0.5 mg/dL (ref 0.3–1.2)
Total Bilirubin: 0.9 mg/dL (ref 0.3–1.2)
Total Protein: 6 g/dL — ABNORMAL LOW (ref 6.5–8.1)
Total Protein: 6 g/dL — ABNORMAL LOW (ref 6.5–8.1)

## 2020-10-07 LAB — ECHOCARDIOGRAM LIMITED
AR max vel: 1.7 cm2
AV Area VTI: 1.6 cm2
AV Area mean vel: 1.59 cm2
AV Mean grad: 26 mmHg
AV Peak grad: 50.7 mmHg
Ao pk vel: 3.56 m/s
Area-P 1/2: 2.45 cm2
Calc EF: 43.3 %
Height: 75 in
S' Lateral: 4.5 cm
Single Plane A2C EF: 30.3 %
Single Plane A4C EF: 59.5 %
Weight: 5005.32 oz

## 2020-10-07 LAB — CBC
HCT: 27.2 % — ABNORMAL LOW (ref 39.0–52.0)
Hemoglobin: 8.7 g/dL — ABNORMAL LOW (ref 13.0–17.0)
MCH: 32.5 pg (ref 26.0–34.0)
MCHC: 32 g/dL (ref 30.0–36.0)
MCV: 101.5 fL — ABNORMAL HIGH (ref 80.0–100.0)
Platelets: 240 10*3/uL (ref 150–400)
RBC: 2.68 MIL/uL — ABNORMAL LOW (ref 4.22–5.81)
RDW: 14.3 % (ref 11.5–15.5)
WBC: 13.8 10*3/uL — ABNORMAL HIGH (ref 4.0–10.5)
nRBC: 0 % (ref 0.0–0.2)

## 2020-10-07 LAB — GLUCOSE, CAPILLARY
Glucose-Capillary: 121 mg/dL — ABNORMAL HIGH (ref 70–99)
Glucose-Capillary: 122 mg/dL — ABNORMAL HIGH (ref 70–99)
Glucose-Capillary: 102 mg/dL — ABNORMAL HIGH (ref 70–99)

## 2020-10-07 LAB — MAGNESIUM: Magnesium: 2.4 mg/dL (ref 1.7–2.4)

## 2020-10-07 MED ORDER — AMIODARONE HCL IN DEXTROSE 360-4.14 MG/200ML-% IV SOLN
30.0000 mg/h | INTRAVENOUS | Status: DC
  Administered 2020-10-07 – 2020-10-08 (×4): 30 mg/h via INTRAVENOUS
  Filled 2020-10-07 (×3): qty 200

## 2020-10-07 MED ORDER — AMIODARONE HCL IN DEXTROSE 360-4.14 MG/200ML-% IV SOLN
INTRAVENOUS | Status: AC
  Administered 2020-10-07: 150 mg via INTRAVENOUS
  Filled 2020-10-07: qty 200

## 2020-10-07 MED ORDER — AMIODARONE LOAD VIA INFUSION
150.0000 mg | Freq: Once | INTRAVENOUS | Status: AC
  Filled 2020-10-07: qty 83.34

## 2020-10-07 MED ORDER — AMIODARONE HCL IN DEXTROSE 360-4.14 MG/200ML-% IV SOLN
60.0000 mg/h | INTRAVENOUS | Status: AC
  Administered 2020-10-07 (×2): 60 mg/h via INTRAVENOUS
  Filled 2020-10-07: qty 200

## 2020-10-07 MED ORDER — PLASMA-LYTE A IV SOLN: INTRAVENOUS | Status: DC

## 2020-10-07 MED ORDER — PERFLUTREN LIPID MICROSPHERE
1.0000 mL | INTRAVENOUS | Status: AC | PRN
  Administered 2020-10-07: 2 mL via INTRAVENOUS
  Filled 2020-10-07: qty 10

## 2020-10-07 NOTE — Progress Notes (Signed)
RN entered room and pt HR was 180-190. RN called Dr Orvan Seen at this time and verbal orders given to start amio protocol. Pt is A/Ox4. Pads placed on

## 2020-10-07 NOTE — Evaluation (Signed)
Occupational Therapy Evaluation Patient Details Name: Matthew Savage MRN: [id number] DOB: [birth year] Today's Date: 10/07/2020    History of Present Illness Pt is a 60 y/o male presenting on 6/16 for CABG with TEE, and ICG with diagnosis of CAD. Pt extubated 6/16. PMH includes: hematuria, class II obesity, DDD, chronic DVT, gout, heart murmur, hyperlipidemia, hypertension, lap band surgery, numbness and tingling, SCC of face, mild AVS, TAA, atrial fibrillation/typical atrial flutter with ablation, CAD multiple MIs.   Clinical Impression   PTA patient independent and working. Admitted for above and presenting with problem list below, including lethargy, decreased activity tolerance, generalized weakness, sternal precautions.  He was educated on sternal precautions and ADL compensatory techniques. Requires min guard for transfers, up to max assist for LB ADLs and min assist for UB ADLs.  Cueing required to maintain eyes open during ADLs today due to lethargy.  VSS during session with SpO2 100% on 4L, HR 68-70 during activity.  Believe he will benefit from further OT services while admitted and after dc at SNF level to optimize independence and safety with ADLs, IADLs and mobility for return to PLOF.     Follow Up Recommendations  SNF;Supervision/Assistance - 24 hour    Equipment Recommendations  Other (comment) (TBD at next venue of care)    Recommendations for Other Services       Precautions / Restrictions Precautions Precautions: Fall;Sternal Precaution Booklet Issued: No Precaution Comments: external pacer Restrictions Weight Bearing Restrictions: Yes Other Position/Activity Restrictions: sternal precautions      Mobility Bed Mobility               General bed mobility comments: OOB in recliner upon entry    Transfers Overall transfer level: Needs assistance Equipment used: None Transfers: Sit to/from Stand Sit to Stand: Min guard         General transfer comment:  min guard for safety/balance, good recall of hand placement holding pillow to power up    Balance Overall balance assessment: Needs assistance Sitting-balance support: Feet supported Sitting balance-Leahy Scale: Fair     Standing balance support: During functional activity;No upper extremity supported Standing balance-Leahy Scale: Poor Standing balance comment: relies on external support, would benefit from RW                           ADL either performed or assessed with clinical judgement   ADL Overall ADL's : Needs assistance/impaired     Grooming: Set up;Supervision/safety;Sitting   Upper Body Bathing: Minimal assistance;Sitting       Upper Body Dressing : Minimal assistance;Sitting   Lower Body Dressing: Maximal assistance;Sit to/from stand   Toilet Transfer: Minimal assistance Armed forces technical officer Details (indicate cue type and reason): simulated in room         Functional mobility during ADLs: Min guard General ADL Comments: pt limited by lethargy, decreased activity tolerance, generalized weakness     Vision   Vision Assessment?: No apparent visual deficits     Perception     Praxis      Pertinent Vitals/Pain Pain Assessment: Faces Faces Pain Scale: Hurts a little bit Pain Location: chest, incisional Pain Descriptors / Indicators: Discomfort Pain Intervention(s): Limited activity within patient's tolerance;Monitored during session;Repositioned     Hand Dominance Right   Extremity/Trunk Assessment Upper Extremity Assessment Upper Extremity Assessment: Generalized weakness (mild edema noted, pt reports "butter hands" with difficulty holding onto objects but coordination and sensation WFL)   Lower Extremity Assessment  Lower Extremity Assessment: Defer to PT evaluation   Cervical / Trunk Assessment Cervical / Trunk Assessment: Other exceptions Cervical / Trunk Exceptions: increased body habitus   Communication Communication Communication:  No difficulties   Cognition Arousal/Alertness: Lethargic Behavior During Therapy: Flat affect Overall Cognitive Status: Impaired/Different from baseline Area of Impairment: Orientation;Problem solving;Awareness;Following commands;Memory                 Orientation Level: Disoriented to;Time   Memory: Decreased recall of precautions Following Commands: Follows one step commands consistently;Follows one step commands with increased time;Follows multi-step commands inconsistently   Awareness: Emergent Problem Solving: Slow processing;Requires tactile cues General Comments: pt initally reports March 2022, after re-orientation able to recall June after 5 minutes. Lethargic and falling asleep brushing teeth, cueing to maintain alertness.  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 Problem List: Decreased strength;Decreased activity tolerance;Impaired balance (sitting and/or standing);Decreased safety awareness;Decreased cognition;Decreased knowledge of use of DME or AE;Decreased knowledge of precautions;Cardiopulmonary status limiting activity;Obesity;Increased edema      OT Treatment/Interventions: Self-care/ADL training;Therapeutic exercise;Energy conservation;DME and/or AE instruction;Therapeutic activities;Cognitive remediation/compensation;Patient/family education;Balance training    OT Goals(Current goals can be found in the care plan  section) Acute Rehab OT Goals Patient Stated Goal: get stronger OT Goal Formulation: With patient Time For Goal Achievement: 10/21/20 Potential to Achieve Goals: Good  OT Frequency: Min 2X/week   Barriers to D/C:            Co-evaluation              AM-PAC OT "6 Clicks" Daily Activity     Outcome Measure Help from another person eating meals?: A Little Help from another person taking care of personal grooming?: A Little Help from another person toileting, which includes using toliet, bedpan, or urinal?: A Lot Help from another person bathing (including washing, rinsing, drying)?: A Lot Help from another person to put on and taking off regular upper body clothing?: A Little Help from another person to put on and taking off regular lower body clothing?: A Lot 6 Click Score: 15   End of Session Equipment Utilized During Treatment: Oxygen Nurse Communication: Mobility status;Precautions  Activity Tolerance: Patient tolerated treatment well;Patient limited by lethargy Patient left: in chair;with call bell/phone within reach;with nursing/sitter in room  OT Visit Diagnosis: Other abnormalities of gait and mobility (R26.89);Muscle weakness (generalized) (M62.81)                Time: 7782-4235 OT Time Calculation (min): 22 min Charges:  OT General Charges $OT Visit: 1 Visit OT Evaluation $OT Eval Moderate Complexity: 1 Mod  Jolaine Artist, OT Acute Rehabilitation Services Pager [phone] Office [phone]   [name] 10/07/2020, 10:18 AM

## 2020-10-07 NOTE — Progress Notes (Signed)
  Echocardiogram 2D Echocardiogram has been performed.  Matthew Savage 10/07/2020, 11:06 AM

## 2020-10-07 NOTE — Progress Notes (Signed)
Patient ID: Matthew Savage, male   DOB: [birth year], 60 y.o.   MRN: [id number] T[facility] Evening Rounds:  Hemodynamically stable in sinus rhythm on IV amio.  Sats 98%  Urine output adequate. Creat is rising postop.  BMET    Component Value Date/Time   NA 125 (L) 10/07/2020 1408   NA 138 04/09/2020 0934   K 4.6 10/07/2020 1408   CL 94 (L) 10/07/2020 1408   CO2 23 10/07/2020 1408   GLUCOSE 106 (H) 10/07/2020 1408   BUN 53 (H) 10/07/2020 1408   BUN 24 04/09/2020 0934   CREATININE 2.77 (H) 10/07/2020 1408   CREATININE 1.08 12/22/2019 0915   CALCIUM 8.4 (L) 10/07/2020 1408   GFRNONAA 25 (L) 10/07/2020 1408   GFRNONAA 75 12/22/2019 0915   GFRAA 79 04/09/2020 0934   GFRAA 87 12/22/2019 0915

## 2020-10-07 NOTE — Progress Notes (Signed)
4 Days Post-Op Procedure(s) (LRB): CORONARY ARTERY BYPASS GRAFTING (CABG) X FOUR, USING BILATERAL MAMMARY ARTERIES AND RIGHT ENDOSCOPIC GREATER SAPHENOUS VEIN CONDUITS (N/A) TRANSESOPHAGEAL ECHOCARDIOGRAM (TEE) (N/A) INDOCYANINE GREEN FLUORESCENCE IMAGING (ICG) (N/A) APPLICATION OF CELL SAVER (N/A) CLIPPING OF ATRIAL APPENDAGE WITH ATRICURE 40 CLIP (Left) ATRIAL SEPTAL DEFECT (ASD) REPAIR WITH PERI GUARD PERICARDIUM (N/A) Subjective: "Arms feel clumsy", passing flatus  Objective: Vital signs in last 24 hours: Temp:  [97.6 F (36.4 C)-98.5 F (36.9 C)] 97.6 F (36.4 C) (06/20 0830) Pulse Rate:  [54-184] 71 (06/20 0700) Cardiac Rhythm: Atrial fibrillation (06/20 0400) Resp:  [6-39] 11 (06/20 0700) BP: (86-140)/(48-113) 135/113 (06/20 0630) SpO2:  [86 %-100 %] 100 % (06/20 0700) Weight:  [141.9 kg] 141.9 kg (06/20 0500)  Hemodynamic parameters for last 24 hours:    Intake/Output from previous day: 06/19 0701 - 06/20 0700 In: 1401.4 [P.O.:1200; I.V.:201.4] Out: 1025 [Urine:1025] Intake/Output this shift: No intake/output data recorded.  General appearance: no distress Neurologic: intact Heart: regular rate and rhythm, S1, S2 normal, no murmur, click, rub or gallop Lungs: clear to auscultation bilaterally Abdomen: mildly distended Extremities: edema 2+ Wound: c/d/i  Lab Results: Recent Labs    10/06/20 0229 10/07/20 0420  WBC 17.2* 13.8*  HGB 8.6* 8.7*  HCT 27.0* 27.2*  PLT 200 240   BMET:  Recent Labs    10/06/20 0229 10/07/20 0420  NA 129* 129*  K 5.0 4.9  CL 96* 95*  CO2 21* 21*  GLUCOSE 113* 112*  BUN 37* 48*  CREATININE 2.99* 2.73*  CALCIUM 8.9 8.9    PT/INR: No results for input(s): LABPROT, INR in the last 72 hours. ABG    Component Value Date/Time   PHART 7.280 (L) 10/03/2020 2146   HCO3 20.5 10/03/2020 2146   TCO2 22 10/03/2020 2146   ACIDBASEDEF 6.0 (H) 10/03/2020 2146   O2SAT 97.0 10/03/2020 2146   CBG (last 3)  Recent Labs     10/06/20 1525 10/06/20 2153 10/07/20 0819  GLUCAP 103* 85 121*    Assessment/Plan: S/P Procedure(s) (LRB): CORONARY ARTERY BYPASS GRAFTING (CABG) X FOUR, USING BILATERAL MAMMARY ARTERIES AND RIGHT ENDOSCOPIC GREATER SAPHENOUS VEIN CONDUITS (N/A) TRANSESOPHAGEAL ECHOCARDIOGRAM (TEE) (N/A) INDOCYANINE GREEN FLUORESCENCE IMAGING (ICG) (N/A) APPLICATION OF CELL SAVER (N/A) CLIPPING OF ATRIAL APPENDAGE WITH ATRICURE 40 CLIP (Left) ATRIAL SEPTAL DEFECT (ASD) REPAIR WITH PERI GUARD PERICARDIUM (N/A) Mobilize Bowel rest Gentle hydration CXR Cont amiodarone   LOS: 7 days    Matthew Savage 10/07/2020

## 2020-10-08 ENCOUNTER — Telehealth: Payer: Self-pay

## 2020-10-08 ENCOUNTER — Inpatient Hospital Stay (HOSPITAL_COMMUNITY): Payer: BC Managed Care – PPO

## 2020-10-08 LAB — CBC
HCT: 25.3 % — ABNORMAL LOW (ref 39.0–52.0)
Hemoglobin: 8.2 g/dL — ABNORMAL LOW (ref 13.0–17.0)
MCH: 32.3 pg (ref 26.0–34.0)
MCHC: 32.4 g/dL (ref 30.0–36.0)
MCV: 99.6 fL (ref 80.0–100.0)
Platelets: 227 10*3/uL (ref 150–400)
RBC: 2.54 MIL/uL — ABNORMAL LOW (ref 4.22–5.81)
RDW: 14.2 % (ref 11.5–15.5)
WBC: 8 10*3/uL (ref 4.0–10.5)
nRBC: 0 % (ref 0.0–0.2)

## 2020-10-08 LAB — COMPREHENSIVE METABOLIC PANEL
ALT: 9 U/L (ref 0–44)
AST: 20 U/L (ref 15–41)
Albumin: 2.6 g/dL — ABNORMAL LOW (ref 3.5–5.0)
Alkaline Phosphatase: 62 U/L (ref 38–126)
Anion gap: 10 (ref 5–15)
BUN: 57 mg/dL — ABNORMAL HIGH (ref 6–20)
CO2: 21 mmol/L — ABNORMAL LOW (ref 22–32)
Calcium: 8.3 mg/dL — ABNORMAL LOW (ref 8.9–10.3)
Chloride: 96 mmol/L — ABNORMAL LOW (ref 98–111)
Creatinine, Ser: 2.54 mg/dL — ABNORMAL HIGH (ref 0.61–1.24)
GFR, Estimated: 28 mL/min — ABNORMAL LOW (ref >60)
Glucose, Bld: 104 mg/dL — ABNORMAL HIGH (ref 70–99)
Potassium: 4.7 mmol/L (ref 3.5–5.1)
Sodium: 127 mmol/L — ABNORMAL LOW (ref 135–145)
Total Bilirubin: 0.9 mg/dL (ref 0.3–1.2)
Total Protein: 5.4 g/dL — ABNORMAL LOW (ref 6.5–8.1)

## 2020-10-08 LAB — GLUCOSE, CAPILLARY
Glucose-Capillary: 94 mg/dL (ref 70–99)
Glucose-Capillary: 99 mg/dL (ref 70–99)

## 2020-10-08 MED ORDER — COLCHICINE 0.3 MG HALF TABLET
0.3000 mg | ORAL_TABLET | Freq: Two times a day (BID) | ORAL | Status: DC
  Administered 2020-10-08 – 2020-10-24 (×32): 0.3 mg via ORAL
  Filled 2020-10-08 (×36): qty 1

## 2020-10-08 MED ORDER — METOCLOPRAMIDE HCL 5 MG/ML IJ SOLN
10.0000 mg | Freq: Three times a day (TID) | INTRAMUSCULAR | Status: AC
  Administered 2020-10-08 (×3): 10 mg via INTRAVENOUS
  Filled 2020-10-08 (×3): qty 2

## 2020-10-08 MED ORDER — ~~LOC~~ CARDIAC SURGERY, PATIENT & FAMILY EDUCATION: Freq: Once | Status: AC

## 2020-10-08 MED ORDER — ENSURE ENLIVE PO LIQD
237.0000 mL | Freq: Two times a day (BID) | ORAL | Status: DC
  Administered 2020-10-08 – 2020-11-05 (×44): 237 mL via ORAL

## 2020-10-08 MED ORDER — SORBITOL 70 % SOLN
200.0000 mL | TOPICAL_OIL | Freq: Once | ORAL | Status: AC
  Administered 2020-10-08: 200 mL via RECTAL
  Filled 2020-10-08: qty 60

## 2020-10-08 MED ORDER — AMIODARONE HCL 200 MG PO TABS
200.0000 mg | ORAL_TABLET | Freq: Two times a day (BID) | ORAL | Status: DC
  Administered 2020-10-08 – 2020-10-17 (×19): 200 mg via ORAL
  Filled 2020-10-08 (×19): qty 1

## 2020-10-08 MED ORDER — FUROSEMIDE 10 MG/ML IJ SOLN
80.0000 mg | Freq: Once | INTRAMUSCULAR | Status: AC
  Administered 2020-10-08: 80 mg via INTRAVENOUS
  Filled 2020-10-08: qty 8

## 2020-10-08 MED FILL — Lidocaine HCl(Cardiac) IV PF Soln Pref Syr 100 MG/5ML (2%): INTRAVENOUS | Qty: 5 | Status: AC

## 2020-10-08 MED FILL — Electrolyte-R (PH 7.4) Solution: INTRAVENOUS | Qty: 6000 | Status: AC

## 2020-10-08 MED FILL — Mannitol IV Soln 20%: INTRAVENOUS | Qty: 500 | Status: AC

## 2020-10-08 MED FILL — Sodium Chloride IV Soln 0.9%: INTRAVENOUS | Qty: 3000 | Status: AC

## 2020-10-08 MED FILL — Heparin Sodium (Porcine) Inj 1000 Unit/ML: INTRAMUSCULAR | Qty: 10 | Status: AC

## 2020-10-08 MED FILL — Sodium Bicarbonate IV Soln 8.4%: INTRAVENOUS | Qty: 50 | Status: AC

## 2020-10-08 MED FILL — Calcium Chloride Inj 10%: INTRAVENOUS | Qty: 10 | Status: AC

## 2020-10-08 NOTE — Progress Notes (Signed)
Mobility Specialist: Progress Note   10/08/20 1731  Mobility  Activity Ambulated to bathroom  Level of Assistance Minimal assist, patient does 75% or more  Assistive Device Front wheel walker  Distance Ambulated (ft) 20 ft  Mobility Ambulated with assistance in room  Mobility Response Tolerated well  Mobility performed by Mobility specialist  Bed Position Chair  $Mobility charge 1 Mobility   Pre-Mobility: 64 HR, 92% SpO2 Post-Mobility: 64 HR, 91% SpO2  Pt requesting to go to the BR upon entering room. Pt to BR where he was able to have a BM but very minimal, mostly gas. Pt to recliner and was set-up with his dinner, call bell in reach.   [facility] Florence Yeung Mobility Specialist Mobility Specialist Phone: [phone]

## 2020-10-08 NOTE — Telephone Encounter (Signed)
STD form completed from [facility] and faxed to [phone]. Beginning leave 09/29/20 through approx. 12/30/20.  Surgery date was 10/03/20

## 2020-10-08 NOTE — Progress Notes (Signed)
Initial Nutrition Assessment  DOCUMENTATION CODES:   Obesity unspecified  INTERVENTION:   Ensure Enlive po BID, each supplement provides 350 kcal and 20 grams of protein   NUTRITION DIAGNOSIS:   Inadequate oral intake related to acute illness as evidenced by per patient/family report.  GOAL:   Patient will meet greater than or equal to 90% of their needs  MONITOR:   PO intake, Supplement acceptance, Labs, Weight trends  REASON FOR ASSESSMENT:   Consult Assessment of nutrition requirement/status  ASSESSMENT:   60 yo male admitted with severe 3-V CAD requiring CABG. PMH includes HLD, gout, HTN, OSA, CAD, hx of gastric lap band  6/16 TEE, CABG x 4  Pt alert, sitting up in chair on visit today  Pt reports no appetite Diet advanced to [place] on 6/17; ate 100% at dinner that night. 0-50% on 6/18. Diet downgraded back to CL on 6/19. CL x 3 days; not eating very much. Diet advanced today to [facility].   Denies any nausea, abdominal discomfort.  Pt is agreeable to oral nutrition supplement at this time given poor appetite.   Last BM 6/17. Noted order for reglan x 3 doses today; also received a SMOG enema. Pt also on colace and dulcoax  Pt with hx of lap band surgery in 2010. Pt reports no issues related to this. Pt reports eating well up until 1 week PTA but does not elaborate much. Pt denies wt loss   Diet Order:   Diet Order             Diet Heart Room service appropriate? Yes; Fluid consistency: Thin  Diet effective now                   EDUCATION NEEDS:   Not appropriate for education at this time  Skin:  Skin Assessment: Skin Integrity Issues: Skin Integrity Issues:: Incisions Incisions: chest  Last BM:  6/17  Height:   Ht Readings from Last 1 Encounters:  10/03/20 6\' 3"  (1.905 m)    Weight:   Wt Readings from Last 1 Encounters:  10/08/20 (!) 142.6 kg     BMI:  Body mass index is 39.29 kg/m.  Estimated Nutritional Needs:   Kcal:   2200-2400 kcals  Protein:  120-135 g  Fluid:  >/= 2 L   Kerman Passey MS, RDN, LDN, CNSC Registered Dietitian III Clinical Nutrition RD Pager and On-Call Pager Number Located in [place]

## 2020-10-08 NOTE — Progress Notes (Signed)
5 Days Post-Op Procedure(s) (LRB): CORONARY ARTERY BYPASS GRAFTING (CABG) X FOUR, USING BILATERAL MAMMARY ARTERIES AND RIGHT ENDOSCOPIC GREATER SAPHENOUS VEIN CONDUITS (N/A) TRANSESOPHAGEAL ECHOCARDIOGRAM (TEE) (N/A) INDOCYANINE GREEN FLUORESCENCE IMAGING (ICG) (N/A) APPLICATION OF CELL SAVER (N/A) CLIPPING OF ATRIAL APPENDAGE WITH ATRICURE 40 CLIP (Left) ATRIAL SEPTAL DEFECT (ASD) REPAIR WITH PERI GUARD PERICARDIUM (N/A) Subjective: Feeling a little better-walked  Objective: Vital signs in last 24 hours: Temp:  [97.6 F (36.4 C)-98.2 F (36.8 C)] 97.9 F (36.6 C) (06/20 2355) Pulse Rate:  [52-70] 68 (06/21 0646) Cardiac Rhythm: Sinus bradycardia;Normal sinus rhythm (06/21 0400) Resp:  [8-23] 23 (06/21 0646) BP: (93-135)/(55-73) 126/67 (06/21 0600) SpO2:  [79 %-100 %] 97 % (06/21 0646) Weight:  [142.6 kg] 142.6 kg (06/21 0646)  Hemodynamic parameters for last 24 hours:    Intake/Output from previous day: 06/20 0701 - 06/21 0700 In: 1650.8 [I.V.:1650.8] Out: 858 [Urine:858] Intake/Output this shift: No intake/output data recorded.  General appearance: alert and cooperative Neurologic: intact Heart: regular rate and rhythm, S1, S2 normal, no murmur, click, rub or gallop Lungs: clear to auscultation bilaterally Abdomen: mildly distended Extremities: edema 2+ Wound: c/d/i Echo from yesterday shows mildly reduced LV function, no pericardial eff  Lab Results: Recent Labs    10/07/20 0420 10/08/20 0055  WBC 13.8* 8.0  HGB 8.7* 8.2*  HCT 27.2* 25.3*  PLT 240 227   BMET:  Recent Labs    10/07/20 1408 10/08/20 0055  NA 125* 127*  K 4.6 4.7  CL 94* 96*  CO2 23 21*  GLUCOSE 106* 104*  BUN 53* 57*  CREATININE 2.77* 2.54*  CALCIUM 8.4* 8.3*    PT/INR: No results for input(s): LABPROT, INR in the last 72 hours. ABG    Component Value Date/Time   PHART 7.280 (L) 10/03/2020 2146   HCO3 20.5 10/03/2020 2146   TCO2 22 10/03/2020 2146   ACIDBASEDEF 6.0 (H)  10/03/2020 2146   O2SAT 97.0 10/03/2020 2146   CBG (last 3)  Recent Labs    10/07/20 1211 10/07/20 1626 10/08/20 0559  GLUCAP 122* 102* 99    Assessment/Plan: S/P Procedure(s) (LRB): CORONARY ARTERY BYPASS GRAFTING (CABG) X FOUR, USING BILATERAL MAMMARY ARTERIES AND RIGHT ENDOSCOPIC GREATER SAPHENOUS VEIN CONDUITS (N/A) TRANSESOPHAGEAL ECHOCARDIOGRAM (TEE) (N/A) INDOCYANINE GREEN FLUORESCENCE IMAGING (ICG) (N/A) APPLICATION OF CELL SAVER (N/A) CLIPPING OF ATRIAL APPENDAGE WITH ATRICURE 40 CLIP (Left) ATRIAL SEPTAL DEFECT (ASD) REPAIR WITH PERI GUARD PERICARDIUM (N/A) Mobilize Diuresis Plan for transfer to step-down: see transfer orders Bowel care   LOS: 8 days    Matthew Savage 10/08/2020

## 2020-10-08 NOTE — Progress Notes (Addendum)
Inpatient Rehabilitation Admissions Coordinator   Inpatient rehab consult received .I met with patient at bedside with his brother and Dad who are here from [place]. Therapy is recommending SNF level rehab. They are in agreement and wish for SNF initially and then he will return to [place] in a few weeks for family caregiver support once he is released medically from the local [facility]. I will alert acute team and TOC. Naaman Plummer and [place], SWs made aware.We will sign off at this time.  Danne Baxter, RN, MSN Rehab Admissions Coordinator [phone] 10/08/2020 2:10 PM

## 2020-10-08 NOTE — Progress Notes (Signed)
PT Cancellation Note  Patient Details Name: Matthew Savage MRN: [id number] DOB: 1960/06/28   Cancelled Treatment:    Reason Eval/Treat Not Completed: Other (comment). Checked on earlier this AM and pt had already amb twice with nursing. Checked again now and pt had amb with cardiac rehab earlier and now just back to bed. Will continue to follow.    Shary Decamp [facility] 10/08/2020, 3:49 PM Cassiel Fernandez Peach Pager [phone] Office [phone]

## 2020-10-08 NOTE — Progress Notes (Signed)
CARDIAC REHAB PHASE I   PRE:  Rate/Rhythm: 73 SB with PVCs    BP: sitting 113/62    SaO2: 92 RA  MODE:  Ambulation: 100 ft   POST:  Rate/Rhythm: 72 SR with PVCs    BP: sitting 112/64     SaO2: 90-92 RA  Pt in bed, needing to use BSC for BM. Mod-max assist x2 to get to EOB. Stood with min assist and rocking with gait belt. Had BM on BSC, also urinated. Stood with min assist with gait belt and walked with RW. Fairly steady with RW and gait belt. Pt DOE, needing x2 rest stops. SaO2 92 RA on return to room. To recliner, quite exerted. Asked for O2 for comfort. Encouraged IS today.   [place], ACSM 10/08/2020 2:45 PM

## 2020-10-08 NOTE — Progress Notes (Signed)
Report called and given to [facility] on step down, pt A &O X4, aware of the transfer, vs wnl as charted at time of transfer, no pain or concerns noted, pt transferred in wheelchair on telemetry. Brother called and notified of the transfer. Foley d/c, PIVs flushed and clamped. Pacer wires secured.

## 2020-10-08 NOTE — Progress Notes (Signed)
  Amiodarone Drug - Drug Interaction Consult Note  Recommendations: Watch QTc with reglan  Amiodarone is metabolized by the cytochrome P450 system and therefore has the potential to cause many drug interactions. Amiodarone has an average plasma half-life of 50 days (range 20 to 100 days).   There is potential for drug interactions to occur several weeks or months after stopping treatment and the onset of drug interactions may be slow after initiating amiodarone.   []  Statins: Increased risk of myopathy. Simvastatin- restrict dose to 20mg  daily. Other statins: counsel patients to report any muscle pain or weakness immediately.  []  Anticoagulants: Amiodarone can increase anticoagulant effect. Consider warfarin dose reduction. Patients should be monitored closely and the dose of anticoagulant altered accordingly, remembering that amiodarone levels take several weeks to stabilize.  []  Antiepileptics: Amiodarone can increase plasma concentration of phenytoin, the dose should be reduced. Note that small changes in phenytoin dose can result in large changes in levels. Monitor patient and counsel on signs of toxicity.  [x]  Beta blockers: increased risk of bradycardia, AV block and myocardial depression. Sotalol - avoid concomitant use.  []   Calcium channel blockers (diltiazem and verapamil): increased risk of bradycardia, AV block and myocardial depression.  []   Cyclosporine: Amiodarone increases levels of cyclosporine. Reduced dose of cyclosporine is recommended.  []  Digoxin dose should be halved when amiodarone is started.  []  Diuretics: increased risk of cardiotoxicity if hypokalemia occurs.  []  Oral hypoglycemic agents (glyburide, glipizide, glimepiride): increased risk of hypoglycemia. Patient's glucose levels should be monitored closely when initiating amiodarone therapy.   [x]  Drugs that prolong the QT interval:  Torsades de pointes risk may be increased with concurrent use - avoid if  possible.  Monitor QTc, also keep magnesium/potassium WNL if concurrent therapy can't be avoided.  Antibiotics: e.g. fluoroquinolones, erythromycin.  Antiarrhythmics: e.g. quinidine, procainamide, disopyramide, sotalol.  Antipsychotics: e.g. phenothiazines, haloperidol.   Lithium, tricyclic antidepressants, and methadone. Thank Concha Pyo  10/08/2020 11:19 AM

## 2020-10-09 ENCOUNTER — Inpatient Hospital Stay (HOSPITAL_COMMUNITY): Payer: BC Managed Care – PPO

## 2020-10-09 LAB — BASIC METABOLIC PANEL
Anion gap: 9 (ref 5–15)
BUN: 64 mg/dL — ABNORMAL HIGH (ref 6–20)
CO2: 24 mmol/L (ref 22–32)
Calcium: 8.2 mg/dL — ABNORMAL LOW (ref 8.9–10.3)
Chloride: 93 mmol/L — ABNORMAL LOW (ref 98–111)
Creatinine, Ser: 2.45 mg/dL — ABNORMAL HIGH (ref 0.61–1.24)
GFR, Estimated: 29 mL/min — ABNORMAL LOW (ref >60)
Glucose, Bld: 103 mg/dL — ABNORMAL HIGH (ref 70–99)
Potassium: 4.2 mmol/L (ref 3.5–5.1)
Sodium: 126 mmol/L — ABNORMAL LOW (ref 135–145)

## 2020-10-09 LAB — CBC
HCT: 24.3 % — ABNORMAL LOW (ref 39.0–52.0)
Hemoglobin: 7.9 g/dL — ABNORMAL LOW (ref 13.0–17.0)
MCH: 32 pg (ref 26.0–34.0)
MCHC: 32.5 g/dL (ref 30.0–36.0)
MCV: 98.4 fL (ref 80.0–100.0)
Platelets: 273 10*3/uL (ref 150–400)
RBC: 2.47 MIL/uL — ABNORMAL LOW (ref 4.22–5.81)
RDW: 14.3 % (ref 11.5–15.5)
WBC: 8.7 10*3/uL (ref 4.0–10.5)
nRBC: 0.5 % — ABNORMAL HIGH (ref 0.0–0.2)

## 2020-10-09 LAB — PREPARE RBC (CROSSMATCH)

## 2020-10-09 MED ORDER — SODIUM CHLORIDE 0.9% IV SOLUTION: Freq: Once | INTRAVENOUS | Status: AC

## 2020-10-09 MED ORDER — FE FUMARATE-B12-VIT C-FA-IFC PO CAPS
1.0000 | ORAL_CAPSULE | Freq: Two times a day (BID) | ORAL | Status: DC
  Administered 2020-10-09 – 2020-11-05 (×49): 1 via ORAL
  Filled 2020-10-09 (×58): qty 1

## 2020-10-09 MED ORDER — FUROSEMIDE 10 MG/ML IJ SOLN
80.0000 mg | Freq: Once | INTRAMUSCULAR | Status: AC
  Administered 2020-10-09: 80 mg via INTRAVENOUS
  Filled 2020-10-09: qty 8

## 2020-10-09 MED ORDER — FUROSEMIDE 40 MG PO TABS: 40.0000 mg | ORAL_TABLET | Freq: Every day | ORAL | Status: DC

## 2020-10-09 NOTE — Progress Notes (Signed)
Pt started drinking first bottle of CT contrast at 3:15 pm and started the 2nd bottle at 4:15 pm

## 2020-10-09 NOTE — Progress Notes (Signed)
CARDIAC REHAB PHASE I   PRE:  Rate/Rhythm: 103 ST with PVCs    BP: sitting 117/78    SaO2: 96 1L, 98 RA  MODE:  Ambulation: 100 ft   POST:  Rate/Rhythm: 135 ST with PACs/PVCs    BP: lying 125/79     SaO2: 97 RA  Pt had been up in recliner since 3 am. HR now much faster than yesterday but still has p waves albeit long PR interval. Stood with min assist and ambulated with RW, gait belt, standby assist x2. C/o fatigue and SOB but overall tolerated well. HR up. To bed for rest, max assist x2 to get legs in bed. Left on RA. Encouraged IS and x2 more walks. [place], ACSM 10/09/2020 10:02 AM

## 2020-10-09 NOTE — Progress Notes (Signed)
PT Cancellation Note  Patient Details Name: Matthew Savage MRN: [id number] DOB: 1960/08/27   Cancelled Treatment:    Reason Eval/Treat Not Completed: Patient declined, no reason specified. PT attempted to see patient twice on this date, upon first attempt pt refusing after returning to bed minutes prior to PT arrival. 2nd attempt pt refusing due to pending CT scan, needing to finish contrast. PT will follow up as time allows.   Zenaida Niece 10/09/2020, 4:43 PM

## 2020-10-09 NOTE — Plan of Care (Signed)
  Problem: Education: Goal: Knowledge of General Education information will improve Description: Including pain rating scale, medication(s)/side effects and non-pharmacologic comfort measures Outcome: Progressing   Problem: Health Behavior/Discharge Planning: Goal: Ability to manage health-related needs will improve Outcome: Progressing   Problem: Clinical Measurements: Goal: Ability to maintain clinical measurements within normal limits will improve Outcome: Progressing   Problem: Activity: Goal: Risk for activity intolerance will decrease Outcome: Progressing   Problem: Nutrition: Goal: Adequate nutrition will be maintained Outcome: Progressing   Problem: Elimination: Goal: Will not experience complications related to bowel motility Outcome: Progressing   Problem: Pain Managment: Goal: General experience of comfort will improve Outcome: Progressing   Problem: Safety: Goal: Ability to remain free from injury will improve Outcome: Progressing   Problem: Skin Integrity: Goal: Risk for impaired skin integrity will decrease Outcome: Progressing   Problem: Education: Goal: Ability to verbalize understanding of medication therapies will improve Outcome: Progressing   Problem: Education: Goal: Ability to demonstrate management of disease process will improve Outcome: Progressing   Problem: Activity: Goal: Capacity to carry out activities will improve Outcome: Progressing

## 2020-10-09 NOTE — Progress Notes (Signed)
OT Cancellation Note  Patient Details Name: Matthew Savage MRN: [id number] DOB: 06/13/1960   Cancelled Treatment:    Reason Eval/Treat Not Completed: Medical issues which prohibited therapy Will follow up.   Joeseph Amor OTR/L  Acute Rehab Services  [phone] office number [phone] pager number  Joeseph Amor 10/09/2020, 11:45 AM

## 2020-10-09 NOTE — Progress Notes (Addendum)
[address]       [place] [address]             [phone]      6 Days Post-Op Procedure(s) (LRB): CORONARY ARTERY BYPASS GRAFTING (CABG) X FOUR, USING BILATERAL MAMMARY ARTERIES AND RIGHT ENDOSCOPIC GREATER SAPHENOUS VEIN CONDUITS (N/A) TRANSESOPHAGEAL ECHOCARDIOGRAM (TEE) (N/A) INDOCYANINE GREEN FLUORESCENCE IMAGING (ICG) (N/A) APPLICATION OF CELL SAVER (N/A) CLIPPING OF ATRIAL APPENDAGE WITH ATRICURE 40 CLIP (Left) ATRIAL SEPTAL DEFECT (ASD) REPAIR WITH PERI GUARD PERICARDIUM (N/A) Subjective: Feels generally poorly. Abdominal discomfort. Some BM/+ flatus  Objective: Vital signs in last 24 hours: Temp:  [97.7 F (36.5 C)-98.3 F (36.8 C)] 97.9 F (36.6 C) (06/22 0301) Pulse Rate:  [56-120] 96 (06/22 0301) Cardiac Rhythm: Normal sinus rhythm (06/21 1905) Resp:  [12-16] 15 (06/22 0301) BP: (100-135)/(54-69) 129/69 (06/22 0301) SpO2:  [94 %-99 %] 97 % (06/22 0301) Weight:  [142.3 kg] 142.3 kg (06/22 0301)  Hemodynamic parameters for last 24 hours:    Intake/Output from previous day: 06/21 0701 - 06/22 0700 In: 782.7 [P.O.:240; I.V.:542.7] Out: 400 [Urine:400] Intake/Output this shift: No intake/output data recorded.  General appearance: alert, cooperative, fatigued, and no distress Heart: regular rate and rhythm Lungs: sonme scattered wheezes, fair air exchange throughout Abdomen: mod distension, + BS non tender Extremities: acute/chronic edema Wound: incis appear to be healing well, minor suture inflammatory response/erethema  Lab Results: Recent Labs    10/08/20 0055 10/09/20 0038  WBC 8.0 8.7  HGB 8.2* 7.9*  HCT 25.3* 24.3*  PLT 227 273   BMET:  Recent Labs    10/08/20 0055 10/09/20 0038  NA 127* 126*  K 4.7 4.2  CL 96* 93*  CO2 21* 24  GLUCOSE 104* 103*  BUN 57* 64*  CREATININE 2.54* 2.45*  CALCIUM 8.3* 8.2*    PT/INR: No results for input(s): LABPROT, INR in the last 72 hours. ABG    Component Value Date/Time    PHART 7.280 (L) 10/03/2020 2146   HCO3 20.5 10/03/2020 2146   TCO2 22 10/03/2020 2146   ACIDBASEDEF 6.0 (H) 10/03/2020 2146   O2SAT 97.0 10/03/2020 2146   CBG (last 3)  Recent Labs    10/07/20 1626 10/07/20 2019 10/08/20 0559  GLUCAP 102* 94 99    Meds Scheduled Meds:  allopurinol  300 mg Oral Daily   amiodarone  200 mg Oral BID   aspirin EC  325 mg Oral Daily   atorvastatin  80 mg Oral Daily   bisacodyl  10 mg Oral Daily   Or   bisacodyl  10 mg Rectal Daily   Chlorhexidine Gluconate Cloth  6 each Topical Daily   colchicine  0.3 mg Oral BID   docusate sodium  200 mg Oral Daily   feeding supplement  237 mL Oral BID BM   isosorbide dinitrate  10 mg Oral TID   metoprolol tartrate  12.5 mg Oral BID   pantoprazole  40 mg Oral Daily   Continuous Infusions:  sodium chloride     sodium chloride 20 mL/hr at 10/03/20 1507   PRN Meds:.dextrose, levalbuterol, metoprolol tartrate, ondansetron (ZOFRAN) IV, oxyCODONE, sodium chloride flush, sodium chloride flush, traMADol  Xrays DG Chest 2 View  Result Date: 10/07/2020 CLINICAL DATA:  History of open heart surgery.  CABG 4 days ago. EXAM: CHEST - 2 VIEW COMPARISON:  Most recent radiograph 10/05/2020 FINDINGS: Median sternotomy. CABG and left atrial clipping. Previous right internal jugular vascular sheath has been removed.  Removal of chest tubes and mediastinal drain. Overlying monitoring devices remain in place. Stable mild cardiomegaly. Unchanged mediastinal contours. Persistent linear atelectasis within both lungs. No pneumothorax. No pulmonary edema. Small bilateral pleural effusions. Gaseous bowel distention in the left upper quadrant similar to prior. Gastric band is partially included IMPRESSION: 1. Removal of chest tubes and mediastinal drain. No pneumothorax. 2. Postsurgical chest with persistent bilateral atelectasis and small pleural effusions. Electronically Signed   By: Keith Rake M.D.   On: 10/07/2020 21:56   DG Chest  Port 1 View  Result Date: 10/08/2020 CLINICAL DATA:  Open-heart surgery. EXAM: PORTABLE CHEST 1 VIEW COMPARISON:  Chest x-ray 10/07/2020. Abdomen 10/06/2020. CT 10/02/2020. FINDINGS: Prior CABG. Left atrial appendage clip in stable position. Cardiomegaly. No pulmonary venous congestion. Low lung volumes with mild bibasilar atelectasis. No pleural effusion or pneumothorax. Gastric band again noted. Gastric distention again noted. IMPRESSION: 1. Prior CABG. Left atrial appendage clip in stable position. Stable cardiomegaly. No pulmonary venous congestion. 2. Low lung volumes with mild bibasilar atelectasis. 3.  Gastric band again noted.  Gastric distention again noted. Electronically Signed   By: Marcello Moores  Register   On: 10/08/2020 11:11   ECHOCARDIOGRAM LIMITED  Result Date: 10/07/2020    ECHOCARDIOGRAM LIMITED REPORT   Patient Name:   Matthew Savage Date of Exam: 10/07/2020 Medical Rec #:  [id number]      Height:       75.0 in Accession #:    [id number]     Weight:       312.8 lb Date of Birth:  1960-10-18      BSA:          2.654 m Patient Age:    60 years       BP:           135/113 mmHg Patient Gender: M              HR:           66 bpm. Exam Location:  Inpatient Procedure: Limited Echo, Color Doppler and Intracardiac Opacification Agent Indications:    I50.40* Unspecified combined systolic (congestive) and diastolic                 (congestive) heart failure  History:        Patient has prior history of Echocardiogram examinations, most                 recent 10/02/2020. CAD and Previous Myocardial Infarction, Aortic                 Valve Disease, Signs/Symptoms:Chest Pain; Risk Factors:Sleep                 Apnea and Hypertension.  Sonographer:    Roseanna Rainbow RDCS Referring Phys: [id number] [facility] Z [place] Luu  Sonographer Comments: Technically difficult study due to poor echo windows and patient is morbidly obese. Image acquisition challenging due to patient body habitus. IMPRESSIONS  1. Left ventricular ejection  fraction, by estimation, is 45 to 50%. The left ventricle has mildly decreased function. The left ventricle demonstrates regional wall motion abnormalities (see scoring diagram/findings for description). There is moderate left ventricular hypertrophy. There is incoordinate septal motion. There is moderate hypokinesis/dyskinesis of the left ventricular, mid-apical anterior wall, anteroseptal wall and inferoapical segment.  2. The aortic valve has an indeterminant number of cusps. Mild to moderate aortic valve stenosis. Aortic valve area, by VTI measures 1.60 cm. Aortic valve mean gradient measures 26.0 mmHg. Aortic  valve Vmax measures 3.56 m/s. Comparison(s): Changes from prior study are noted. 10/02/2020: LVEF 50-55%, mild to moderate aortic valve setnosis - mean gradient 29 mmHg. FINDINGS  Left Ventricle: Left ventricular ejection fraction, by estimation, is 45 to 50%. The left ventricle has mildly decreased function. The left ventricle demonstrates regional wall motion abnormalities. Moderate hypokinesis/dyskinesis of the left ventricular, mid-apical anterior wall, anteroseptal wall and inferoapical segment. Definity contrast agent was given IV to delineate the left ventricular endocardial borders. The left ventricular internal cavity size was normal in size. There is moderate  left ventricular hypertrophy. Incoordinate septal motion. Aortic Valve: The aortic valve has an indeterminant number of cusps. Mild to moderate aortic stenosis is present. Aortic valve mean gradient measures 26.0 mmHg. Aortic valve peak gradient measures 50.7 mmHg. Aortic valve area, by VTI measures 1.60 cm. Venous: IVC assessment for right atrial pressure unable to be performed due to mechanical ventilation. LEFT VENTRICLE PLAX 2D LVIDd:         5.30 cm LVIDs:         4.50 cm LV PW:         1.40 cm LV IVS:        1.30 cm LVOT diam:     2.40 cm LV SV:         105 LV SV Index:   40 LVOT Area:     4.52 cm  LV Volumes (MOD) LV vol d, MOD  A2C: 185.0 ml LV vol d, MOD A4C: 163.0 ml LV vol s, MOD A2C: 129.0 ml LV vol s, MOD A4C: 66.0 ml LV SV MOD A2C:     56.0 ml LV SV MOD A4C:     163.0 ml LV SV MOD BP:      76.8 ml LEFT ATRIUM         Index LA diam:    4.20 cm 1.58 cm/m  AORTIC VALVE AV Area (Vmax):    1.70 cm AV Area (Vmean):   1.59 cm AV Area (VTI):     1.60 cm AV Vmax:           356.00 cm/s AV Vmean:          230.000 cm/s AV VTI:            0.654 m AV Peak Grad:      50.7 mmHg AV Mean Grad:      26.0 mmHg LVOT Vmax:         134.00 cm/s LVOT Vmean:        80.900 cm/s LVOT VTI:          0.232 m LVOT/AV VTI ratio: 0.35  AORTA Ao Root diam: 4.10 cm MITRAL VALVE MV Area (PHT): 2.45 cm    SHUNTS MV Decel Time: 310 msec    Systemic VTI:  0.23 m MV E velocity: 76.30 cm/s  Systemic Diam: 2.40 cm MV A velocity: 62.10 cm/s MV E/A ratio:  1.23 Lyman Bishop MD Electronically signed by Lyman Bishop MD Signature Date/Time: 10/07/2020/12:52:34 PM    Final     Assessment/Plan: S/P Procedure(s) (LRB): CORONARY ARTERY BYPASS GRAFTING (CABG) X FOUR, USING BILATERAL MAMMARY ARTERIES AND RIGHT ENDOSCOPIC GREATER SAPHENOUS VEIN CONDUITS (N/A) TRANSESOPHAGEAL ECHOCARDIOGRAM (TEE) (N/A) INDOCYANINE GREEN FLUORESCENCE IMAGING (ICG) (N/A) APPLICATION OF CELL SAVER (N/A) CLIPPING OF ATRIAL APPENDAGE WITH ATRICURE 40 CLIP (Left) ATRIAL SEPTAL DEFECT (ASD) REPAIR WITH PERI GUARD PERICARDIUM (N/A) POD#6 1 afeb, VSS, intermit afib- conts amio/beta blocker 2 sats good on 2 liters 3 weight stable.Marland Kitchen UOP  not accurate 4 H/H , fairly stable ABLA - will add Trinsicon 5 AKI- BUN/ Creat has plateaued- now trending down, prob needs further diuresis, will order daily po- follow closely, some of edema is chronic venous stasis, EF somewhat impaired- MD has ordered more lasix IV today 6 hyponatremia 7 ileus- sow to resolve but improving , limit pain meds as able/ cont therapies 8 SNF at d/c 9 push pulm toilet/nebs    LOS: 9 days    John Giovanni PA-C Pager [phone]  [phone] 10/09/2020  Pt seen and examined; doing ok from heart standpoint but his abd remains distended. Plan txfuse one unit prbc; check abd/pelvis CT scan  Kooper Chriswell Z. Orvan Seen, [place]

## 2020-10-10 ENCOUNTER — Encounter (HOSPITAL_COMMUNITY): Payer: Self-pay | Admitting: Cardiothoracic Surgery

## 2020-10-10 ENCOUNTER — Inpatient Hospital Stay (HOSPITAL_COMMUNITY): Payer: BC Managed Care – PPO

## 2020-10-10 LAB — BPAM RBC
Blood Product Expiration Date: 202207182359
ISSUE DATE / TIME: 202206221125
Unit Type and Rh: 5100

## 2020-10-10 LAB — BASIC METABOLIC PANEL
Anion gap: 12 (ref 5–15)
BUN: 67 mg/dL — ABNORMAL HIGH (ref 6–20)
CO2: 20 mmol/L — ABNORMAL LOW (ref 22–32)
Calcium: 8.1 mg/dL — ABNORMAL LOW (ref 8.9–10.3)
Chloride: 95 mmol/L — ABNORMAL LOW (ref 98–111)
Creatinine, Ser: 1.86 mg/dL — ABNORMAL HIGH (ref 0.61–1.24)
GFR, Estimated: 41 mL/min — ABNORMAL LOW (ref >60)
Glucose, Bld: 100 mg/dL — ABNORMAL HIGH (ref 70–99)
Potassium: 4 mmol/L (ref 3.5–5.1)
Sodium: 127 mmol/L — ABNORMAL LOW (ref 135–145)

## 2020-10-10 LAB — TYPE AND SCREEN
ABO/RH(D): O POS
Antibody Screen: NEGATIVE
Unit division: 0

## 2020-10-10 MED ORDER — FUROSEMIDE 40 MG PO TABS
40.0000 mg | ORAL_TABLET | Freq: Every day | ORAL | Status: DC
  Administered 2020-10-10 – 2020-10-12 (×3): 40 mg via ORAL
  Filled 2020-10-10 (×3): qty 1

## 2020-10-10 MED ORDER — POTASSIUM CHLORIDE CRYS ER 10 MEQ PO TBCR
10.0000 meq | EXTENDED_RELEASE_TABLET | Freq: Every day | ORAL | Status: DC
  Administered 2020-10-10 – 2020-10-12 (×3): 10 meq via ORAL
  Filled 2020-10-10 (×3): qty 1

## 2020-10-10 MED ORDER — IPRATROPIUM-ALBUTEROL 0.5-2.5 (3) MG/3ML IN SOLN
3.0000 mL | Freq: Four times a day (QID) | RESPIRATORY_TRACT | Status: DC
  Administered 2020-10-10 – 2020-10-11 (×5): 3 mL via RESPIRATORY_TRACT
  Filled 2020-10-10 (×6): qty 3

## 2020-10-10 NOTE — Plan of Care (Signed)
  Problem: Education: Goal: Knowledge of General Education information will improve Description: Including pain rating scale, medication(s)/side effects and non-pharmacologic comfort measures Outcome: Progressing   Problem: Health Behavior/Discharge Planning: Goal: Ability to manage health-related needs will improve Outcome: Progressing   Problem: Clinical Measurements: Goal: Ability to maintain clinical measurements within normal limits will improve Outcome: Progressing   Problem: Clinical Measurements: Goal: Diagnostic test results will improve Outcome: Progressing   Problem: Clinical Measurements: Goal: Respiratory complications will improve Outcome: Progressing   Problem: Clinical Measurements: Goal: Cardiovascular complication will be avoided Outcome: Progressing   Problem: Activity: Goal: Risk for activity intolerance will decrease Outcome: Progressing   Problem: Elimination: Goal: Will not experience complications related to bowel motility Outcome: Progressing   Problem: Pain Managment: Goal: General experience of comfort will improve Outcome: Progressing   Problem: Skin Integrity: Goal: Risk for impaired skin integrity will decrease Outcome: Progressing   Problem: Education: Goal: Ability to demonstrate management of disease process will improve Outcome: Progressing   Problem: Education: Goal: Ability to verbalize understanding of medication therapies will improve Outcome: Progressing   Problem: Activity: Goal: Capacity to carry out activities will improve Outcome: Progressing

## 2020-10-10 NOTE — Progress Notes (Signed)
1113 Came to see pt to walk. Mobility specialist in to see pt now. PT to see pt later in pm. Graylon Good RN BSN 10/10/2020 11:14 AM

## 2020-10-10 NOTE — Progress Notes (Signed)
Physical Therapy Treatment Patient Details Name: Matthew Savage MRN: [id number] DOB: Jul 25, 1960 Today's Date: 10/10/2020    History of Present Illness Pt is a 60 y/o male presenting on 6/16 for CABG with TEE, and ICG with diagnosis of CAD. Pt extubated 6/16. PMH includes: hematuria, class II obesity, DDD, chronic DVT, gout, heart murmur, hyperlipidemia, hypertension, lap band surgery, numbness and tingling, SCC of face, mild AVS, TAA, atrial fibrillation/typical atrial flutter with ablation, CAD multiple MIs.    PT Comments    Progressing steadily toward goals.  Still having mild difficulty building momentum to come to EOB without struggle or significant use of UE's.  Otherwise emphasis on sit to stand and gait stability/stamina.    Follow Up Recommendations  SNF     Equipment Recommendations  Rolling walker with 5" wheels;3in1 (PT);Wheelchair (measurements PT);Wheelchair cushion (measurements PT)    Recommendations for Other Services       Precautions / Restrictions Precautions Precautions: Fall;Sternal Precaution Booklet Issued: No Restrictions Other Position/Activity Restrictions: sternal precautions    Mobility  Bed Mobility Overal bed mobility: Needs Assistance Bed Mobility: Supine to Sit     Supine to sit: Min assist     General bed mobility comments: cues for best technique to follow sternal prec.  minimal assist at L shoulder to build momentum to come up without effort.    Transfers Overall transfer level: Needs assistance Equipment used: Rolling walker (2 wheeled) Transfers: Sit to/from Stand Sit to Stand: Min guard         General transfer comment: appropriate placement of hands in lap, min guard for safety  Ambulation/Gait Ambulation/Gait assistance: Min guard Gait Distance (Feet): 250 Feet Assistive device: Rolling walker (2 wheeled) Gait Pattern/deviations: Step-through pattern   Gait velocity interpretation: 1.31 - 2.62 ft/sec, indicative of  limited community ambulator General Gait Details: improved stability with RW, light use, slower speed, EHR 119 bpm, sats 99-100 on 2L [place]   Stairs             Wheelchair Mobility    Modified Rankin (Stroke Patients Only)       Balance     Sitting balance-Leahy Scale: Fair       Standing balance-Leahy Scale: Fair Standing balance comment: but prefers to use the AD                            Cognition Arousal/Alertness: Awake/alert Behavior During Therapy: Flat affect Overall Cognitive Status: Within Functional Limits for tasks assessed                                        Exercises      General Comments        Pertinent Vitals/Pain Faces Pain Scale: Hurts a little bit Pain Location: chest, incisional Pain Descriptors / Indicators: Discomfort Pain Intervention(s): Monitored during session    Home Living                      Prior Function            PT Goals (current goals can now be found in the care plan section) Acute Rehab PT Goals Patient Stated Goal: get stronger PT Goal Formulation: With patient Time For Goal Achievement: 10/19/20 Potential to Achieve Goals: Good Progress towards PT goals: Progressing toward goals    Frequency  Min 2X/week      PT Plan Current plan remains appropriate    Co-evaluation              AM-PAC PT "6 Clicks" Mobility   Outcome Measure  Help needed turning from your back to your side while in a flat bed without using bedrails?: A Little Help needed moving from lying on your back to sitting on the side of a flat bed without using bedrails?: A Little Help needed moving to and from a bed to a chair (including a wheelchair)?: A Little Help needed standing up from a chair using your arms (e.g., wheelchair or bedside chair)?: A Little Help needed to walk in hospital room?: A Little Help needed climbing 3-5 steps with a railing? : A Lot 6 Click Score: 17    End  of Session Equipment Utilized During Treatment: Oxygen Activity Tolerance: Patient tolerated treatment well Patient left: Other (comment);with call bell/phone within reach (in the bathroom on toilet) Nurse Communication: Mobility status PT Visit Diagnosis: Unsteadiness on feet (R26.81);Other abnormalities of gait and mobility (R26.89);Muscle weakness (generalized) (M62.81);Difficulty in walking, not elsewhere classified (R26.2)     Time: 7955-8316 PT Time Calculation (min) (ACUTE ONLY): 21 min  Charges:  $Gait Training: 8-22 mins                     10/10/2020  Ginger Carne., PT Acute Rehabilitation Services [phone]  (pager) [phone]  (office)   Tessie Fass Damonica Chopra 10/10/2020, 5:02 PM

## 2020-10-10 NOTE — Progress Notes (Signed)
Mobility Specialist: Progress Note   10/10/20 1143  Mobility  Activity Ambulated in hall;Transferred to/from [facility]  Level of Assistance Minimal assist, patient does 75% or more  Assistive Device Front wheel walker  Distance Ambulated (ft) 250 ft  Mobility Ambulated with assistance in hallway  Mobility Response Tolerated well  Mobility performed by Mobility specialist  $Mobility charge 1 Mobility   Pre-Mobility: 110 HR, 100% SpO2 Post-Mobility: 115 HR  Pt to BSC and then agreeable to ambulate. Pt was able to have minimal BM but mostly gas. Pt asx throughout ambulation. Pt to BR after walk and then back to bed per request with call bell at his side.   [facility] Amana Bouska Mobility Specialist Mobility Specialist Phone: [phone]

## 2020-10-10 NOTE — Consult Note (Signed)
Matthew Savage [birth year]  [id number].    Requesting MD: Dr. Fredrich Romans Chief Complaint/Reason for Consult: ileus  HPI:  This is a 50 obese male with a history of CAD, here now for CABG, lap band surgery, a flutter, s/p ablation, HTN, SCC, DVTs, and obesity who was admitted on 6/16 secondary to the need for a CABG.  He underwent 4 vessel bypass and has been here recovering ever since.  He has been having increasing abdominal distention over the last several days.  He denies any N/V or abdominal pain.  He had not had a BM for the duration of his stay until last night when he began having diarrhea.  He actually states he feels less bloated and better this morning and has eaten a regular breakfast.  However, before this happened overnight, he underwent a CT scan that revealed a dilated stomach and some small bowel with a gradual transition likely favoring ileus, but psbo not ruled out.  We have been asked to see for further evaluation and recommendations.   ROS: ROS: Please see HPI, otherwise complains of left > right LE edema, otherwise all other systems have been reviewed and are currently negative.  Family History  Problem Relation Age of Onset   Breast cancer Mother    Other Mother        small cell carcinoma   Hypertension Father    Colon polyps Father    Colon polyps Brother     Past Medical History:  Diagnosis Date   Blood in urine    Class 2 obesity 09/30/2020   DDD (degenerative disc disease)    DVT (deep venous thrombosis) ([place]) 2017 & 2018   Gout    Heart murmur    Hyperlipidemia    Hypertension    Numbness and tingling    right arm and hand   SCC (squamous cell carcinoma)    skin, squamous cell, face    Past Surgical History:  Procedure Laterality Date   A-FLUTTER ABLATION N/A 03/11/2020   Procedure: A-FLUTTER ABLATION;  Surgeon: Deboraha Sprang, MD;  Location: [facility];  Service: Cardiovascular;  Laterality: N/A;   ASD REPAIR N/A 10/03/2020    Procedure: ATRIAL SEPTAL DEFECT (ASD) REPAIR WITH PERI GUARD PERICARDIUM;  Surgeon: Wonda Olds, MD;  Location: [facility];  Service: Open Heart Surgery;  Laterality: N/A;   CLIPPING OF ATRIAL APPENDAGE Left 10/03/2020   Procedure: CLIPPING OF ATRIAL APPENDAGE WITH ATRICURE 71 CLIP;  Surgeon: Wonda Olds, MD;  Location: [facility];  Service: Open Heart Surgery;  Laterality: Left;   CORONARY ARTERY BYPASS GRAFT N/A 10/03/2020   Procedure: CORONARY ARTERY BYPASS GRAFTING (CABG) X FOUR, USING BILATERAL MAMMARY ARTERIES AND RIGHT ENDOSCOPIC GREATER SAPHENOUS VEIN CONDUITS;  Surgeon: Wonda Olds, MD;  Location: [facility];  Service: Open Heart Surgery;  Laterality: N/A;   HERNIA REPAIR  1967   X 2   LAPAROSCOPIC GASTRIC BANDING  03/2009   [place] CATH AND CORONARY ANGIOGRAPHY N/A 10/01/2020   Procedure: LEFT HEART CATH AND CORONARY ANGIOGRAPHY;  Surgeon: Troy Sine, MD;  Location: [facility];  Service: Cardiovascular;  Laterality: N/A;   MASS EXCISION  12/03/2011   X 2 (gluteal) X 1 (lower back)   MOHS SURGERY     x 2   TEE WITHOUT CARDIOVERSION N/A 10/03/2020   Procedure: TRANSESOPHAGEAL ECHOCARDIOGRAM (TEE);  Surgeon: Wonda Olds, MD;  Location: [facility];  Service: Open Heart Surgery;  Laterality: N/A;    Social History:  reports that he quit smoking about 9 months ago. His smoking use included cigarettes. He has a 72.00 pack-year smoking history. He has never used smokeless tobacco. He reports current alcohol use of about 12.0 standard drinks of alcohol per week. He reports that he does not use drugs.  Allergies:  Allergies  Allergen Reactions   Levaquin [Levofloxacin]     Aortic Aneurysm    Medications Prior to Admission  Medication Sig Dispense Refill   acetaminophen (TYLENOL) 500 MG tablet Take 1,500 mg by mouth at bedtime as needed for mild pain or moderate pain.      allopurinol (ZYLOPRIM) 300 MG tablet TAKE 1 TABLET BY MOUTH EVERY DAY (Patient  taking differently: Take 300 mg by mouth daily.) 90 tablet 0   atorvastatin (LIPITOR) 40 MG tablet TAKE 1 TABLET BY MOUTH EVERYDAY AT BEDTIME (Patient taking differently: Take 40 mg by mouth daily.) 90 tablet 0   colchicine 0.6 MG tablet Take 0.6 mg by mouth daily as needed (Flair up gout).      furosemide (LASIX) 20 MG tablet Take 1 tablet (20 mg total) by mouth daily. 30 tablet 3   Homeopathic Products (LEG CRAMPS PO) Take 2 tablets by mouth at bedtime as needed (leg cramps).      metoprolol succinate (TOPROL-XL) 50 MG 24 hr tablet TAKE 1 TABLET BY MOUTH EVERY DAY (Patient taking differently: Take 50 mg by mouth daily.) 90 tablet 0   rivaroxaban (XARELTO) 20 MG TABS tablet Take 1 tablet (20 mg total) by mouth daily with supper. 90 tablet 3   sacubitril-valsartan (ENTRESTO) 49-51 MG Take 1 tablet by mouth 2 (two) times daily. 60 tablet 11     Physical Exam: Blood pressure 114/61, pulse 94, temperature 98 F (36.7 C), temperature source Oral, resp. rate 20, height 6\' 3"  (1.905 m), weight (!) 141.9 kg, SpO2 94 %. General: pleasant, morbidly obese white male who is laying in bed in NAD HEENT: head is normocephalic, atraumatic.  Sclera are noninjected.  PERRL.  Ears and nose without any masses or lesions.  Mouth is pink and moist Heart: regular, rate, and rhythm.  Normal s1,s2. No obvious murmurs, gallops, or rubs noted.  Palpable radial and pedal pulses bilaterally Lungs/chest: CTAB, no wheezes, rhonchi, or rales noted.  Respiratory effort nonlabored.  Midsternal incision is well healing, wires still in place Abd: soft, obese, some distention, +BS, no masses, hernias, unable to assess organomegaly due to body habitus MS: all 4 extremities are symmetrical with no cyanosis, clubbing, but with chronic stasis changes as well as L>R LE edema Skin: warm and dry with no masses, lesions, or rashes Neuro: Cranial nerves 2-12 grossly intact, sensation is normal throughout Psych: A&Ox3 with an appropriate  affect.   Results for orders placed or performed during the hospital encounter of 09/29/20 (from the past 48 hour(s))  CBC     Status: Abnormal   Collection Time: 10/09/20 12:38 AM  Result Value Ref Range   WBC 8.7 4.0 - 10.5 K/uL   RBC 2.47 (L) 4.22 - 5.81 MIL/uL   Hemoglobin 7.9 (L) 13.0 - 17.0 g/dL   HCT 24.3 (L) 39.0 - 52.0 %   MCV 98.4 80.0 - 100.0 fL   MCH 32.0 26.0 - 34.0 pg   MCHC 32.5 30.0 - 36.0 g/dL   RDW 14.3 11.5 - 15.5 %   Platelets 273 150 - 400 K/uL   nRBC 0.5 (H)  0.0 - 0.2 %    Comment: Performed at [facility], [place] [address], [place], [place] [address]  Basic metabolic panel     Status: Abnormal   Collection Time: 10/09/20 12:38 AM  Result Value Ref Range   Sodium 126 (L) 135 - 145 mmol/L   Potassium 4.2 3.5 - 5.1 mmol/L   Chloride 93 (L) 98 - 111 mmol/L   CO2 24 22 - 32 mmol/L   Glucose, Bld 103 (H) 70 - 99 mg/dL    Comment: Glucose reference range applies only to samples taken after fasting for at least 8 hours.   BUN 64 (H) 6 - 20 mg/dL   Creatinine, Ser 2.45 (H) 0.61 - 1.24 mg/dL   Calcium 8.2 (L) 8.9 - 10.3 mg/dL   GFR, Estimated 29 (L) >60 mL/min    Comment: (NOTE) Calculated using the CKD-EPI Creatinine Equation (2021)    Anion gap 9 5 - 15    Comment: Performed at [facility] [address], [place], [place] [address]  Prepare RBC (crossmatch)     Status: None   Collection Time: 10/09/20  8:18 AM  Result Value Ref Range   Order Confirmation      ORDER PROCESSED BY BLOOD BANK Performed at [facility], [place] [address] [place], [place] [address]   Type and screen [place]     Status: None (Preliminary result)   Collection Time: 10/09/20  8:18 AM  Result Value Ref Range   ABO/RH(D) O POS    Antibody Screen NEG    Sample Expiration 10/12/2020,2359    Unit Number [id number]    Blood Component Type RED CELLS,LR    Unit division 00    Status of Unit ISSUED    Transfusion Status OK TO TRANSFUSE     Crossmatch Result      Compatible Performed at [facility], [place] [address] [place], [place] [address]   Basic metabolic panel     Status: Abnormal   Collection Time: 10/10/20  4:16 AM  Result Value Ref Range   Sodium 127 (L) 135 - 145 mmol/L   Potassium 4.0 3.5 - 5.1 mmol/L   Chloride 95 (L) 98 - 111 mmol/L   CO2 20 (L) 22 - 32 mmol/L   Glucose, Bld 100 (H) 70 - 99 mg/dL    Comment: Glucose reference range applies only to samples taken after fasting for at least 8 hours.   BUN 67 (H) 6 - 20 mg/dL   Creatinine, Ser 1.86 (H) 0.61 - 1.24 mg/dL   Calcium 8.1 (L) 8.9 - 10.3 mg/dL   GFR, Estimated 41 (L) >60 mL/min    Comment: (NOTE) Calculated using the CKD-EPI Creatinine Equation (2021)    Anion gap 12 5 - 15    Comment: Performed at [facility] [address], [place], [place] [address]   CT ABDOMEN PELVIS WO CONTRAST  Result Date: 10/09/2020 CLINICAL DATA:  Bowel obstruction suspected Abdominal distension.  Recent CABG. EXAM: CT ABDOMEN AND PELVIS WITHOUT CONTRAST TECHNIQUE: Multidetector CT imaging of the abdomen and pelvis was performed following the standard protocol without IV contrast. COMPARISON:  CT 01/17/2020 FINDINGS: Lower chest: Densely calcified subcarinal mediastinal lymph nodes as seen on recent chest CT. Small amount of anterior mediastinal gas likely related to recent CABG. Trace pericardial fluid. Small bilateral pleural effusions. Calcified granuloma in the left lower lobe. No basilar pneumothorax. Hepatobiliary: Focal hepatic abnormality on this noncontrast  exam. Mild gallbladder distention. Gallstones on prior exam not seen, no gallbladder calcification. No biliary dilatation. Pancreas: No ductal dilatation or inflammation. Spleen: Calcified splenic granuloma. Adrenals/Urinary Tract: Normal adrenal glands. No hydronephrosis or perinephric edema. Bilateral renal cysts, as well as hyperdense cyst which were previously characterized on renal protocol CT.  Urinary bladder is physiologically near completely empty. Stomach/Bowel: Gastric band in place. The stomach is prominently dilated with air, small amount of residual enteric contrast. The included distal esophagus is mildly patulous. Dilated fluid-filled small bowel measuring up to 4.5 cm in diameter. Enteric contrast reaches the mid proximal small bowel. There is gradual tapering of the distal small bowel to nondilated, but no discrete transition point. No small bowel pneumatosis. Normal appendix. Mildly distended in the ascending and transverse colon with air and fluid/liquid stool. Left colon is nondistended scattered diverticula. No diverticulitis. There is no colonic inflammation or wall thickening. Vascular/Lymphatic: Aortic and branch atherosclerosis. No aortic aneurysm there is no bulky abdominopelvic adenopathy. Reproductive: Prostate is unremarkable. Other: Small volume abdominopelvic ascites. No free air or focal fluid collection. Subcutaneous edema. Lap band port in the anterior midline subcutaneous tissues. Musculoskeletal: Degenerative change in the spine, with Modic endplate changes at O7-M7. Median sternotomy IMPRESSION: 1. Dilated fluid-filled small bowel with gradual tapering to nondilated distal small bowel. No discrete transition point. Favor small bowel ileus over partial obstruction. Left colonic diverticulosis without diverticulitis. 2. Small volume abdominopelvic ascites. Small bilateral pleural effusions. Aortic Atherosclerosis (ICD10-I70.0). Electronically Signed   By: Keith Rake M.D.   On: 10/09/2020 19:34   DG Chest Port 1 View  Result Date: 10/09/2020 CLINICAL DATA:  Chest pain, post open heart surgery EXAM: PORTABLE CHEST 1 VIEW COMPARISON:  Portable exam 0639 hours compared to 10/08/2020 FINDINGS: Enlargement of cardiac silhouette post median sternotomy, CABG, and LEFT atrial appendage clipping. Stable mediastinal contours and pulmonary vascularity. Scattered atelectasis in  both lungs. No pleural effusion or pneumothorax. Catheter at LEFT lung base/LEFT upper quadrant with gaseous distention of bowel under the LEFT hemidiaphragm. Osseous structures unremarkable. IMPRESSION: Scattered atelectasis bilaterally. Enlargement of cardiac silhouette post neck surgery. Electronically Signed   By: Lavonia Dana M.D.   On: 10/09/2020 08:28      Assessment/Plan Ileus, s/p CABG The patient likely has an ileus s/p CABG.  He has a large stomach below his lap band on imaging yesterday however, has started having multiple BMs since last night and feels better.  He has eaten breakfast this morning with no N/V.  I will order new films today to see how things appear.  If it shows improvement, then no other intervention would be necessary at this time.  If his stomach is still dilated and appears any worse, then he will need to be NPO and have an NGT placed.  We will follow along.   FEN - currently on HH diet VTE - ASA ID - none  CAD, s/p 4 vessel CABG Morbid obesity, s/p lap bad HTN H/O DVT H/O A flutter  Henreitta Cea, [facility] Surgery 10/10/2020, 10:19 AM Please see Amion for pager number during day hours 7:00am-4:30pm or 7:00am -11:30am on weekends

## 2020-10-10 NOTE — Plan of Care (Signed)

## 2020-10-10 NOTE — Progress Notes (Signed)
[address]       [place] [address]             [phone]      7 Days Post-Op Procedure(s) (LRB): CORONARY ARTERY BYPASS GRAFTING (CABG) X FOUR, USING BILATERAL MAMMARY ARTERIES AND RIGHT ENDOSCOPIC GREATER SAPHENOUS VEIN CONDUITS (N/A) TRANSESOPHAGEAL ECHOCARDIOGRAM (TEE) (N/A) INDOCYANINE GREEN FLUORESCENCE IMAGING (ICG) (N/A) APPLICATION OF CELL SAVER (N/A) CLIPPING OF ATRIAL APPENDAGE WITH ATRICURE 40 CLIP (Left) ATRIAL SEPTAL DEFECT (ASD) REPAIR WITH PERI GUARD PERICARDIUM (N/A) Subjective: Feels better, several BM yesterday, conts to pass flatus  Objective: Vital signs in last 24 hours: Temp:  [97.5 F (36.4 C)-98.9 F (37.2 C)] 97.9 F (36.6 C) (06/23 0452) Pulse Rate:  [91-103] 91 (06/23 0452) Cardiac Rhythm: Atrial flutter;Atrial fibrillation (06/23 0721) Resp:  [13-19] 19 (06/23 0452) BP: (104-141)/(60-92) 108/67 (06/23 0452) SpO2:  [93 %-96 %] 95 % (06/23 0452) Weight:  [141.9 kg] 141.9 kg (06/23 0523)  Hemodynamic parameters for last 24 hours:    Intake/Output from previous day: 06/22 0701 - 06/23 0700 In: 1020 [P.O.:740; Blood:280] Out: 700 [Urine:700] Intake/Output this shift: No intake/output data recorded.  General appearance: alert, cooperative, and no distress Heart: regular rate and rhythm Lungs: wheezes scattered Abdomen: less distended, + BS, non tender Extremities: edema improved, chronic stasis Wound: incis healing well  Lab Results: Recent Labs    10/08/20 0055 10/09/20 0038  WBC 8.0 8.7  HGB 8.2* 7.9*  HCT 25.3* 24.3*  PLT 227 273   BMET:  Recent Labs    10/09/20 0038 10/10/20 0416  NA 126* 127*  K 4.2 4.0  CL 93* 95*  CO2 24 20*  GLUCOSE 103* 100*  BUN 64* 67*  CREATININE 2.45* 1.86*  CALCIUM 8.2* 8.1*    PT/INR: No results for input(s): LABPROT, INR in the last 72 hours. ABG    Component Value Date/Time   PHART 7.280 (L) 10/03/2020 2146   HCO3 20.5 10/03/2020 2146   TCO2 22 10/03/2020  2146   ACIDBASEDEF 6.0 (H) 10/03/2020 2146   O2SAT 97.0 10/03/2020 2146   CBG (last 3)  Recent Labs    10/07/20 1626 10/07/20 2019 10/08/20 0559  GLUCAP 102* 94 99    Meds Scheduled Meds:  allopurinol  300 mg Oral Daily   amiodarone  200 mg Oral BID   aspirin EC  325 mg Oral Daily   atorvastatin  80 mg Oral Daily   bisacodyl  10 mg Oral Daily   Or   bisacodyl  10 mg Rectal Daily   colchicine  0.3 mg Oral BID   docusate sodium  200 mg Oral Daily   feeding supplement  237 mL Oral BID BM   ferrous [id number] C-folic acid  1 capsule Oral BID PC   isosorbide dinitrate  10 mg Oral TID   metoprolol tartrate  12.5 mg Oral BID   pantoprazole  40 mg Oral Daily   Continuous Infusions:  sodium chloride     sodium chloride 20 mL/hr at 10/03/20 1507   PRN Meds:.dextrose, levalbuterol, metoprolol tartrate, ondansetron (ZOFRAN) IV, oxyCODONE, sodium chloride flush, sodium chloride flush, traMADol  Xrays CT ABDOMEN PELVIS WO CONTRAST  Result Date: 10/09/2020 CLINICAL DATA:  Bowel obstruction suspected Abdominal distension.  Recent CABG. EXAM: CT ABDOMEN AND PELVIS WITHOUT CONTRAST TECHNIQUE: Multidetector CT imaging of the abdomen and pelvis was performed following the standard protocol without IV contrast. COMPARISON:  CT 01/17/2020 FINDINGS: Lower chest: Densely calcified subcarinal mediastinal lymph  nodes as seen on recent chest CT. Small amount of anterior mediastinal gas likely related to recent CABG. Trace pericardial fluid. Small bilateral pleural effusions. Calcified granuloma in the left lower lobe. No basilar pneumothorax. Hepatobiliary: Focal hepatic abnormality on this noncontrast exam. Mild gallbladder distention. Gallstones on prior exam not seen, no gallbladder calcification. No biliary dilatation. Pancreas: No ductal dilatation or inflammation. Spleen: Calcified splenic granuloma. Adrenals/Urinary Tract: Normal adrenal glands. No hydronephrosis or perinephric edema.  Bilateral renal cysts, as well as hyperdense cyst which were previously characterized on renal protocol CT. Urinary bladder is physiologically near completely empty. Stomach/Bowel: Gastric band in place. The stomach is prominently dilated with air, small amount of residual enteric contrast. The included distal esophagus is mildly patulous. Dilated fluid-filled small bowel measuring up to 4.5 cm in diameter. Enteric contrast reaches the mid proximal small bowel. There is gradual tapering of the distal small bowel to nondilated, but no discrete transition point. No small bowel pneumatosis. Normal appendix. Mildly distended in the ascending and transverse colon with air and fluid/liquid stool. Left colon is nondistended scattered diverticula. No diverticulitis. There is no colonic inflammation or wall thickening. Vascular/Lymphatic: Aortic and branch atherosclerosis. No aortic aneurysm there is no bulky abdominopelvic adenopathy. Reproductive: Prostate is unremarkable. Other: Small volume abdominopelvic ascites. No free air or focal fluid collection. Subcutaneous edema. Lap band port in the anterior midline subcutaneous tissues. Musculoskeletal: Degenerative change in the spine, with Modic endplate changes at Z6-X0. Median sternotomy IMPRESSION: 1. Dilated fluid-filled small bowel with gradual tapering to nondilated distal small bowel. No discrete transition point. Favor small bowel ileus over partial obstruction. Left colonic diverticulosis without diverticulitis. 2. Small volume abdominopelvic ascites. Small bilateral pleural effusions. Aortic Atherosclerosis (ICD10-I70.0). Electronically Signed   By: Keith Rake M.D.   On: 10/09/2020 19:34   DG Chest Port 1 View  Result Date: 10/09/2020 CLINICAL DATA:  Chest pain, post open heart surgery EXAM: PORTABLE CHEST 1 VIEW COMPARISON:  Portable exam 0639 hours compared to 10/08/2020 FINDINGS: Enlargement of cardiac silhouette post median sternotomy, CABG, and LEFT  atrial appendage clipping. Stable mediastinal contours and pulmonary vascularity. Scattered atelectasis in both lungs. No pleural effusion or pneumothorax. Catheter at LEFT lung base/LEFT upper quadrant with gaseous distention of bowel under the LEFT hemidiaphragm. Osseous structures unremarkable. IMPRESSION: Scattered atelectasis bilaterally. Enlargement of cardiac silhouette post neck surgery. Electronically Signed   By: Lavonia Dana M.D.   On: 10/09/2020 08:28   DG Chest Port 1 View  Result Date: 10/08/2020 CLINICAL DATA:  Open-heart surgery. EXAM: PORTABLE CHEST 1 VIEW COMPARISON:  Chest x-ray 10/07/2020. Abdomen 10/06/2020. CT 10/02/2020. FINDINGS: Prior CABG. Left atrial appendage clip in stable position. Cardiomegaly. No pulmonary venous congestion. Low lung volumes with mild bibasilar atelectasis. No pleural effusion or pneumothorax. Gastric band again noted. Gastric distention again noted. IMPRESSION: 1. Prior CABG. Left atrial appendage clip in stable position. Stable cardiomegaly. No pulmonary venous congestion. 2. Low lung volumes with mild bibasilar atelectasis. 3.  Gastric band again noted.  Gastric distention again noted. Electronically Signed   By: Marcello Moores  Register   On: 10/08/2020 11:11    Assessment/Plan: S/P Procedure(s) (LRB): CORONARY ARTERY BYPASS GRAFTING (CABG) X FOUR, USING BILATERAL MAMMARY ARTERIES AND RIGHT ENDOSCOPIC GREATER SAPHENOUS VEIN CONDUITS (N/A) TRANSESOPHAGEAL ECHOCARDIOGRAM (TEE) (N/A) INDOCYANINE GREEN FLUORESCENCE IMAGING (ICG) (N/A) APPLICATION OF CELL SAVER (N/A) CLIPPING OF ATRIAL APPENDAGE WITH ATRICURE 40 CLIP (Left) ATRIAL SEPTAL DEFECT (ASD) REPAIR WITH PERI GUARD PERICARDIUM (N/A)  1 afeb, VSS, a flutter, sinus , short  episode of trigemminy 2 sats  good on 2 liters, will change xopenex prn to duoneb scheduled for now- heavy tobacco use hx  3 CT results noted favors ileus > partial SBO, some ascites and small pleural effusions. Cont to push rehab/PT 4  weight stable , conts to diurese 5 BUN slightly up to 67 and creat has improved significantly from 2.45 yesterday to 1.86 today- cont to diurese. Sodium up a little- consider adding spironolactone to lasix use. Heavy ETOH user.  6 SNF at d/c 7 repeat cbc in am- transfused yesterday    LOS: 10 days    John Giovanni PA-C Pager [phone] 10/10/2020

## 2020-10-10 NOTE — Progress Notes (Signed)
RT note. Patient home cpap set up for the night. RT to continue to monitor.

## 2020-10-11 ENCOUNTER — Inpatient Hospital Stay (HOSPITAL_COMMUNITY): Payer: BC Managed Care – PPO

## 2020-10-11 ENCOUNTER — Other Ambulatory Visit: Payer: Self-pay | Admitting: Adult Health

## 2020-10-11 DIAGNOSIS — M79606 Pain in leg, unspecified: Secondary | ICD-10-CM

## 2020-10-11 DIAGNOSIS — Z86718 Personal history of other venous thrombosis and embolism: Secondary | ICD-10-CM

## 2020-10-11 DIAGNOSIS — M109 Gout, unspecified: Secondary | ICD-10-CM

## 2020-10-11 DIAGNOSIS — M7989 Other specified soft tissue disorders: Secondary | ICD-10-CM

## 2020-10-11 LAB — BASIC METABOLIC PANEL
Anion gap: 11 (ref 5–15)
BUN: 67 mg/dL — ABNORMAL HIGH (ref 6–20)
CO2: 23 mmol/L (ref 22–32)
Calcium: 7.9 mg/dL — ABNORMAL LOW (ref 8.9–10.3)
Chloride: 97 mmol/L — ABNORMAL LOW (ref 98–111)
Creatinine, Ser: 1.73 mg/dL — ABNORMAL HIGH (ref 0.61–1.24)
GFR, Estimated: 45 mL/min — ABNORMAL LOW (ref >60)
Glucose, Bld: 106 mg/dL — ABNORMAL HIGH (ref 70–99)
Potassium: 3.8 mmol/L (ref 3.5–5.1)
Sodium: 131 mmol/L — ABNORMAL LOW (ref 135–145)

## 2020-10-11 LAB — CBC
HCT: 27.2 % — ABNORMAL LOW (ref 39.0–52.0)
Hemoglobin: 9.1 g/dL — ABNORMAL LOW (ref 13.0–17.0)
MCH: 31.8 pg (ref 26.0–34.0)
MCHC: 33.5 g/dL (ref 30.0–36.0)
MCV: 95.1 fL (ref 80.0–100.0)
Platelets: 327 10*3/uL (ref 150–400)
RBC: 2.86 MIL/uL — ABNORMAL LOW (ref 4.22–5.81)
RDW: 14.6 % (ref 11.5–15.5)
WBC: 7.3 10*3/uL (ref 4.0–10.5)
nRBC: 0.4 % — ABNORMAL HIGH (ref 0.0–0.2)

## 2020-10-11 MED ORDER — ENOXAPARIN SODIUM 30 MG/0.3ML IJ SOSY
30.0000 mg | PREFILLED_SYRINGE | INTRAMUSCULAR | Status: DC
  Administered 2020-10-11 – 2020-10-12 (×2): 30 mg via SUBCUTANEOUS
  Filled 2020-10-11 (×2): qty 0.3

## 2020-10-11 NOTE — Progress Notes (Addendum)
Pt just walked recently, sts he has ambulated x5 today (we checked with him twice today). Congratulated him. Began discussing education with pt. Discussed IS, sternal precautions, walking/exercise, and diet. Pt receptive, asking appropriate questions. He has some complaints but overall feeling well. Did not get to finish education as nursing came to pull wires. Will f/u tomorrow. Note pt does c/o left foot pain. Both feet are swollen but left is much colder. Notified RN. [place], ACSM 3:07 PM 10/11/2020

## 2020-10-11 NOTE — Progress Notes (Addendum)
[address]       [place] [address]             [phone]      8 Days Post-Op Procedure(s) (LRB): CORONARY ARTERY BYPASS GRAFTING (CABG) X FOUR, USING BILATERAL MAMMARY ARTERIES AND RIGHT ENDOSCOPIC GREATER SAPHENOUS VEIN CONDUITS (N/A) TRANSESOPHAGEAL ECHOCARDIOGRAM (TEE) (N/A) INDOCYANINE GREEN FLUORESCENCE IMAGING (ICG) (N/A) APPLICATION OF CELL SAVER (N/A) CLIPPING OF ATRIAL APPENDAGE WITH ATRICURE 40 CLIP (Left) ATRIAL SEPTAL DEFECT (ASD) REPAIR WITH PERI GUARD PERICARDIUM (N/A) Subjective: Feels better , some leg pain   Objective: Vital signs in last 24 hours: Temp:  [97.6 F (36.4 C)-98.7 F (37.1 C)] 97.7 F (36.5 C) (06/24 0700) Pulse Rate:  [82-115] 94 (06/24 0700) Cardiac Rhythm: Heart block (06/24 0400) Resp:  [11-23] 23 (06/24 0700) BP: (98-154)/(60-138) 100/70 (06/24 0700) SpO2:  [94 %-98 %] 96 % (06/24 0400) Weight:  [138.8 kg] 138.8 kg (06/24 0630)  Hemodynamic parameters for last 24 hours:    Intake/Output from previous day: 06/23 0701 - 06/24 0700 In: 480 [P.O.:480] Out: 1000 [Urine:1000] Intake/Output this shift: No intake/output data recorded.  General appearance: alert, cooperative, and no distress Heart: regular rate and rhythm and occas extrasystole Lungs: dim left base, not wheezing  Abdomen: benign Extremities: + edema Wound: incis healing well  Lab Results: Recent Labs    10/09/20 0038 10/11/20 0051  WBC 8.7 7.3  HGB 7.9* 9.1*  HCT 24.3* 27.2*  PLT 273 327   BMET:  Recent Labs    10/10/20 0416 10/11/20 0051  NA 127* 131*  K 4.0 3.8  CL 95* 97*  CO2 20* 23  GLUCOSE 100* 106*  BUN 67* 67*  CREATININE 1.86* 1.73*  CALCIUM 8.1* 7.9*    PT/INR: No results for input(s): LABPROT, INR in the last 72 hours. ABG    Component Value Date/Time   PHART 7.280 (L) 10/03/2020 2146   HCO3 20.5 10/03/2020 2146   TCO2 22 10/03/2020 2146   ACIDBASEDEF 6.0 (H) 10/03/2020 2146   O2SAT 97.0 10/03/2020 2146    CBG (last 3)  No results for input(s): GLUCAP in the last 72 hours.  Meds Scheduled Meds:  allopurinol  300 mg Oral Daily   amiodarone  200 mg Oral BID   aspirin EC  325 mg Oral Daily   atorvastatin  80 mg Oral Daily   bisacodyl  10 mg Oral Daily   Or   bisacodyl  10 mg Rectal Daily   colchicine  0.3 mg Oral BID   docusate sodium  200 mg Oral Daily   feeding supplement  237 mL Oral BID BM   ferrous [id number] C-folic acid  1 capsule Oral BID PC   furosemide  40 mg Oral Daily   ipratropium-albuterol  3 mL Nebulization Q6H   isosorbide dinitrate  10 mg Oral TID   metoprolol tartrate  12.5 mg Oral BID   pantoprazole  40 mg Oral Daily   potassium chloride  10 mEq Oral Daily   Continuous Infusions:  sodium chloride     sodium chloride 20 mL/hr at 10/03/20 1507   PRN Meds:.dextrose, metoprolol tartrate, ondansetron (ZOFRAN) IV, oxyCODONE, sodium chloride flush, sodium chloride flush, traMADol  Xrays CT ABDOMEN PELVIS WO CONTRAST  Result Date: 10/09/2020 CLINICAL DATA:  Bowel obstruction suspected Abdominal distension.  Recent CABG. EXAM: CT ABDOMEN AND PELVIS WITHOUT CONTRAST TECHNIQUE: Multidetector CT imaging of the abdomen and pelvis was performed following the standard protocol without IV contrast.  COMPARISON:  CT 01/17/2020 FINDINGS: Lower chest: Densely calcified subcarinal mediastinal lymph nodes as seen on recent chest CT. Small amount of anterior mediastinal gas likely related to recent CABG. Trace pericardial fluid. Small bilateral pleural effusions. Calcified granuloma in the left lower lobe. No basilar pneumothorax. Hepatobiliary: Focal hepatic abnormality on this noncontrast exam. Mild gallbladder distention. Gallstones on prior exam not seen, no gallbladder calcification. No biliary dilatation. Pancreas: No ductal dilatation or inflammation. Spleen: Calcified splenic granuloma. Adrenals/Urinary Tract: Normal adrenal glands. No hydronephrosis or perinephric edema.  Bilateral renal cysts, as well as hyperdense cyst which were previously characterized on renal protocol CT. Urinary bladder is physiologically near completely empty. Stomach/Bowel: Gastric band in place. The stomach is prominently dilated with air, small amount of residual enteric contrast. The included distal esophagus is mildly patulous. Dilated fluid-filled small bowel measuring up to 4.5 cm in diameter. Enteric contrast reaches the mid proximal small bowel. There is gradual tapering of the distal small bowel to nondilated, but no discrete transition point. No small bowel pneumatosis. Normal appendix. Mildly distended in the ascending and transverse colon with air and fluid/liquid stool. Left colon is nondistended scattered diverticula. No diverticulitis. There is no colonic inflammation or wall thickening. Vascular/Lymphatic: Aortic and branch atherosclerosis. No aortic aneurysm there is no bulky abdominopelvic adenopathy. Reproductive: Prostate is unremarkable. Other: Small volume abdominopelvic ascites. No free air or focal fluid collection. Subcutaneous edema. Lap band port in the anterior midline subcutaneous tissues. Musculoskeletal: Degenerative change in the spine, with Modic endplate changes at D4-Y8. Median sternotomy IMPRESSION: 1. Dilated fluid-filled small bowel with gradual tapering to nondilated distal small bowel. No discrete transition point. Favor small bowel ileus over partial obstruction. Left colonic diverticulosis without diverticulitis. 2. Small volume abdominopelvic ascites. Small bilateral pleural effusions. Aortic Atherosclerosis (ICD10-I70.0). Electronically Signed   By: Keith Rake M.D.   On: 10/09/2020 19:34   DG Abd Portable 1V  Result Date: 10/10/2020 CLINICAL DATA:  Evaluate ileus EXAM: PORTABLE ABDOMEN - 1 VIEW COMPARISON:  CT AP 10/09/2020 FINDINGS: Gastric band identified. Gaseous distension of the stomach is again noted, unchanged. Persistent gaseous distension of the  small bowel loops with gas and stool noted throughout the colon up to the rectum. IMPRESSION: Persistent gaseous distension of the stomach and small bowel loops compatible with either ileus or partial small bowel obstruction. Electronically Signed   By: Kerby Moors M.D.   On: 10/10/2020 11:08    Assessment/Plan: S/P Procedure(s) (LRB): CORONARY ARTERY BYPASS GRAFTING (CABG) X FOUR, USING BILATERAL MAMMARY ARTERIES AND RIGHT ENDOSCOPIC GREATER SAPHENOUS VEIN CONDUITS (N/A) TRANSESOPHAGEAL ECHOCARDIOGRAM (TEE) (N/A) INDOCYANINE GREEN FLUORESCENCE IMAGING (ICG) (N/A) APPLICATION OF CELL SAVER (N/A) CLIPPING OF ATRIAL APPENDAGE WITH ATRICURE 40 CLIP (Left) ATRIAL SEPTAL DEFECT (ASD) REPAIR WITH PERI GUARD PERICARDIUM (N/A)  1 afeb, VSSrare HTN reading, mostly well controlled, sinus  2 sats good on RA-2 liters, CPAP at night 3 renal fxn conts show improvement in creat, sodium improving, BUN elevation is stable 4 anemia is improved following transfusion appropriately 5 no leukocytosis 6 BS adeq controlled 7 appreciate general surgery input, assist with ileus v partial sbo, gastric distension 8 improving with PT slowly- SNF at d/c 9 believes his gout is acting up, cont colchicine /allopurinol 10 add lovenox, will check venous duplex 11 d/c epw's 12 cont current diuretic    LOS: 11 days    John Giovanni PA-C Pager [phone] 10/11/2020  Pt seen and examined; agree with documentation. Improving slowly. May need weekend to sort out  disposition. Cope Marte Z. Orvan Seen, [place]

## 2020-10-11 NOTE — Progress Notes (Signed)
Mobility Specialist: Progress Note   10/11/20 1358  Mobility  Activity Ambulated in hall  Level of Assistance Minimal assist, patient does 75% or more  Assistive Device Front wheel walker  Distance Ambulated (ft) 200 ft  Mobility Ambulated with assistance in hallway  Mobility Response Tolerated well  Mobility performed by Mobility specialist  $Mobility charge 1 Mobility   Pre-Mobility: 94 HR Post-Mobility: 84 HR, 124/70 BP, 100% SpO2  Pt ambulated on 1 L/min [place]. Pt was minA to sit EOB from supine and standby assist during ambulation. Pt back to bed after walk and received breathing treatment from RT. Pt has call bell at his side.   [facility] Andersen Iorio Mobility Specialist Mobility Specialist Phone: [phone]

## 2020-10-11 NOTE — Progress Notes (Signed)
Lower extremity venous bilateral study completed.   Please see CV Proc for preliminary results.   Orene Abbasi, RDMS, RVT  

## 2020-10-11 NOTE — Progress Notes (Signed)
8 Days Post-Op  Subjective: Patient states he is feeling better today but upset because no one has come to help him mobilize yet this morning.  Still having BMs and denies N/V and feels less bloated  ROS: See above, otherwise other systems negative  Objective: Vital signs in last 24 hours: Temp:  [97.6 F (36.4 C)-98.7 F (37.1 C)] 97.7 F (36.5 C) (06/24 0700) Pulse Rate:  [82-115] 96 (06/24 0812) Resp:  [13-23] 16 (06/24 0812) BP: (98-154)/(60-138) 100/70 (06/24 0700) SpO2:  [96 %-98 %] 96 % (06/24 0713) FiO2 (%):  [28 %] 28 % (06/24 0812) Weight:  [138.8 kg] 138.8 kg (06/24 0630) Last BM Date: 10/10/20  Intake/Output from previous day: 06/23 0701 - 06/24 0700 In: 480 [P.O.:480] Out: 1000 [Urine:1000] Intake/Output this shift: No intake/output data recorded.  PE: Abd: obese, soft, less distended, +BS, NT  Lab Results:  Recent Labs    10/09/20 0038 10/11/20 0051  WBC 8.7 7.3  HGB 7.9* 9.1*  HCT 24.3* 27.2*  PLT 273 327   BMET Recent Labs    10/10/20 0416 10/11/20 0051  NA 127* 131*  K 4.0 3.8  CL 95* 97*  CO2 20* 23  GLUCOSE 100* 106*  BUN 67* 67*  CREATININE 1.86* 1.73*  CALCIUM 8.1* 7.9*   PT/INR No results for input(s): LABPROT, INR in the last 72 hours. CMP     Component Value Date/Time   NA 131 (L) 10/11/2020 0051   NA 138 04/09/2020 0934   K 3.8 10/11/2020 0051   CL 97 (L) 10/11/2020 0051   CO2 23 10/11/2020 0051   GLUCOSE 106 (H) 10/11/2020 0051   BUN 67 (H) 10/11/2020 0051   BUN 24 04/09/2020 0934   CREATININE 1.73 (H) 10/11/2020 0051   CREATININE 1.08 12/22/2019 0915   CALCIUM 7.9 (L) 10/11/2020 0051   PROT 5.4 (L) 10/08/2020 0055   PROT 6.5 07/08/2020 0847   ALBUMIN 2.6 (L) 10/08/2020 0055   ALBUMIN 3.9 07/08/2020 0847   AST 20 10/08/2020 0055   ALT 9 10/08/2020 0055   ALKPHOS 62 10/08/2020 0055   BILITOT 0.9 10/08/2020 0055   BILITOT 0.5 07/08/2020 0847   GFRNONAA 45 (L) 10/11/2020 0051   GFRNONAA 75 12/22/2019 0915    GFRAA 79 04/09/2020 0934   GFRAA 87 12/22/2019 0915   Lipase     Component Value Date/Time   LIPASE 27 09/29/2020 2245       Studies/Results: CT ABDOMEN PELVIS WO CONTRAST  Result Date: 10/09/2020 CLINICAL DATA:  Bowel obstruction suspected Abdominal distension.  Recent CABG. EXAM: CT ABDOMEN AND PELVIS WITHOUT CONTRAST TECHNIQUE: Multidetector CT imaging of the abdomen and pelvis was performed following the standard protocol without IV contrast. COMPARISON:  CT 01/17/2020 FINDINGS: Lower chest: Densely calcified subcarinal mediastinal lymph nodes as seen on recent chest CT. Small amount of anterior mediastinal gas likely related to recent CABG. Trace pericardial fluid. Small bilateral pleural effusions. Calcified granuloma in the left lower lobe. No basilar pneumothorax. Hepatobiliary: Focal hepatic abnormality on this noncontrast exam. Mild gallbladder distention. Gallstones on prior exam not seen, no gallbladder calcification. No biliary dilatation. Pancreas: No ductal dilatation or inflammation. Spleen: Calcified splenic granuloma. Adrenals/Urinary Tract: Normal adrenal glands. No hydronephrosis or perinephric edema. Bilateral renal cysts, as well as hyperdense cyst which were previously characterized on renal protocol CT. Urinary bladder is physiologically near completely empty. Stomach/Bowel: Gastric band in place. The stomach is prominently dilated with air, small amount of residual enteric contrast. The included  distal esophagus is mildly patulous. Dilated fluid-filled small bowel measuring up to 4.5 cm in diameter. Enteric contrast reaches the mid proximal small bowel. There is gradual tapering of the distal small bowel to nondilated, but no discrete transition point. No small bowel pneumatosis. Normal appendix. Mildly distended in the ascending and transverse colon with air and fluid/liquid stool. Left colon is nondistended scattered diverticula. No diverticulitis. There is no colonic  inflammation or wall thickening. Vascular/Lymphatic: Aortic and branch atherosclerosis. No aortic aneurysm there is no bulky abdominopelvic adenopathy. Reproductive: Prostate is unremarkable. Other: Small volume abdominopelvic ascites. No free air or focal fluid collection. Subcutaneous edema. Lap band port in the anterior midline subcutaneous tissues. Musculoskeletal: Degenerative change in the spine, with Modic endplate changes at G2-X5. Median sternotomy IMPRESSION: 1. Dilated fluid-filled small bowel with gradual tapering to nondilated distal small bowel. No discrete transition point. Favor small bowel ileus over partial obstruction. Left colonic diverticulosis without diverticulitis. 2. Small volume abdominopelvic ascites. Small bilateral pleural effusions. Aortic Atherosclerosis (ICD10-I70.0). Electronically Signed   By: Keith Rake M.D.   On: 10/09/2020 19:34   DG Abd Portable 1V  Result Date: 10/10/2020 CLINICAL DATA:  Evaluate ileus EXAM: PORTABLE ABDOMEN - 1 VIEW COMPARISON:  CT AP 10/09/2020 FINDINGS: Gastric band identified. Gaseous distension of the stomach is again noted, unchanged. Persistent gaseous distension of the small bowel loops with gas and stool noted throughout the colon up to the rectum. IMPRESSION: Persistent gaseous distension of the stomach and small bowel loops compatible with either ileus or partial small bowel obstruction. Electronically Signed   By: Kerby Moors M.D.   On: 10/10/2020 11:08    Anti-infectives: Anti-infectives (From admission, onward)    Start     Dose/Rate Route Frequency Ordered Stop   10/03/20 2200  ceFAZolin (ANCEF) IVPB 2g/100 mL premix        2 g 200 mL/hr over 30 Minutes Intravenous Every 8 hours 10/03/20 1506 10/05/20 1346   10/03/20 2145  vancomycin (VANCOCIN) IVPB 1000 mg/200 mL premix        1,000 mg 200 mL/hr over 60 Minutes Intravenous  Once 10/03/20 1506 10/03/20 2254   10/03/20 0932  vancomycin (VANCOCIN) powder  Status:   Discontinued          As needed 10/03/20 0933 10/03/20 1500   10/03/20 0400  vancomycin (VANCOREADY) IVPB 1500 mg/300 mL        1,500 mg 150 mL/hr over 120 Minutes Intravenous To Surgery 10/02/20 1959 10/03/20 0839   10/03/20 0400  ceFAZolin (ANCEF) IVPB 2g/100 mL premix  Status:  Discontinued        2 g 200 mL/hr over 30 Minutes Intravenous To Surgery 10/02/20 1957 10/03/20 1502   10/03/20 0400  ceFAZolin (ANCEF) IVPB 3g/100 mL premix        3 g 200 mL/hr over 30 Minutes Intravenous To Surgery 10/02/20 1959 10/03/20 0816        Assessment/Plan Ileus, s/p CABG -despite some gaseous distention noted on plain film yesterday as well the patient is moving his bowels, tolerating a regular diet, and have no nausea or vomiting.   -at this point, treat him clinically -no need for NGT unless he develops emesis. -cont to mobilize for better gut function, may sure K>4, and minimize narcotics. -given patient is stable and has bowel function, we will sign off at this time.  Let us know if we can be of further assistance.   FEN -HH diet VTE - ASA ID - none  CAD, s/p 4 vessel CABG Morbid obesity, s/p lap bad HTN H/O DVT H/O A flutter   LOS: 11 days    Henreitta Cea , [facility] Surgery 10/11/2020, 8:57 AM Please see Amion for pager number during day hours 7:00am-4:30pm or 7:00am -11:30am on weekends

## 2020-10-11 NOTE — Progress Notes (Signed)
Refused TEDS claiming it causes him pain on his legs.

## 2020-10-11 NOTE — Plan of Care (Signed)
  Problem: Education: Goal: Knowledge of General Education information will improve Description: Including pain rating scale, medication(s)/side effects and non-pharmacologic comfort measures Outcome: Progressing   Problem: Health Behavior/Discharge Planning: Goal: Ability to manage health-related needs will improve Outcome: Progressing   Problem: Clinical Measurements: Goal: Ability to maintain clinical measurements within normal limits will improve Outcome: Progressing   Problem: Clinical Measurements: Goal: Will remain free from infection Outcome: Progressing   Problem: Clinical Measurements: Goal: Diagnostic test results will improve Outcome: Progressing   Problem: Activity: Goal: Risk for activity intolerance will decrease Outcome: Progressing   Problem: Nutrition: Goal: Adequate nutrition will be maintained Outcome: Progressing   Problem: Pain Managment: Goal: General experience of comfort will improve Outcome: Progressing   Problem: Elimination: Goal: Will not experience complications related to bowel motility Outcome: Progressing   Problem: Skin Integrity: Goal: Risk for impaired skin integrity will decrease Outcome: Progressing   Problem: Education: Goal: Ability to demonstrate management of disease process will improve Outcome: Progressing   Problem: Education: Goal: Ability to verbalize understanding of medication therapies will improve Outcome: Progressing   Problem: Activity: Goal: Capacity to carry out activities will improve Outcome: Progressing

## 2020-10-11 NOTE — Progress Notes (Signed)
Pacer wire removed , intact ,with the supervision of  nurse educator Pat, tolerated well. V/s taken every15 min x4, aware of bedrest x1 hour.  Band aids applied to the sites.Continue to monitor.

## 2020-10-12 ENCOUNTER — Other Ambulatory Visit: Payer: Self-pay | Admitting: Adult Health

## 2020-10-12 DIAGNOSIS — I10 Essential (primary) hypertension: Secondary | ICD-10-CM

## 2020-10-12 DIAGNOSIS — I4891 Unspecified atrial fibrillation: Secondary | ICD-10-CM

## 2020-10-12 MED ORDER — IPRATROPIUM-ALBUTEROL 0.5-2.5 (3) MG/3ML IN SOLN: 3.0000 mL | Freq: Four times a day (QID) | RESPIRATORY_TRACT | Status: DC | PRN

## 2020-10-12 MED ORDER — FUROSEMIDE 40 MG PO TABS
40.0000 mg | ORAL_TABLET | Freq: Two times a day (BID) | ORAL | Status: DC
  Administered 2020-10-12 – 2020-10-13 (×2): 40 mg via ORAL
  Filled 2020-10-12 (×2): qty 1

## 2020-10-12 MED ORDER — RIVAROXABAN 20 MG PO TABS
20.0000 mg | ORAL_TABLET | Freq: Every day | ORAL | Status: DC
  Administered 2020-10-12 – 2020-10-27 (×15): 20 mg via ORAL
  Filled 2020-10-12 (×16): qty 1

## 2020-10-12 MED ORDER — SIMETHICONE 80 MG PO CHEW
80.0000 mg | CHEWABLE_TABLET | Freq: Four times a day (QID) | ORAL | Status: DC | PRN
  Administered 2020-10-12 – 2020-10-19 (×17): 80 mg via ORAL
  Filled 2020-10-12 (×17): qty 1

## 2020-10-12 MED ORDER — METOLAZONE 2.5 MG PO TABS
2.5000 mg | ORAL_TABLET | Freq: Once | ORAL | Status: AC
  Administered 2020-10-12: 2.5 mg via ORAL
  Filled 2020-10-12: qty 1

## 2020-10-12 MED ORDER — POTASSIUM CHLORIDE CRYS ER 20 MEQ PO TBCR
20.0000 meq | EXTENDED_RELEASE_TABLET | Freq: Three times a day (TID) | ORAL | Status: DC
  Administered 2020-10-12 – 2020-10-13 (×3): 20 meq via ORAL
  Filled 2020-10-12 (×3): qty 1

## 2020-10-12 MED ORDER — ENOXAPARIN SODIUM 40 MG/0.4ML IJ SOSY: 40.0000 mg | PREFILLED_SYRINGE | INTRAMUSCULAR | Status: DC

## 2020-10-12 NOTE — Progress Notes (Signed)
CARDIAC REHAB PHASE I   PRE:  Rate/Rhythm: 101 aflutter? With PVCs    BP: sitting 100/76    SaO2: 97 2L, 95 RA  MODE:  Ambulation: 410 ft   POST:  Rate/Rhythm: 125 aflutter    BP: sitting 115/73     SaO2: 95 RA   Pt c/o lack of sleep and left foot pain. Bilateral feet swelling, feet down in recliner. Requested his sandals instead of socks. Stood independently and walked with RW, no assist. HR up, looks to be aflutter. No c/o walking, likes to walk. RN got telebox and he can now walk independently. Reviewed sternal precautions, practiced IS (1000 ml), and discussed CRPII. Will refer to [facility] in [place] where he will stay with his brother. Pt doing well except his foot pain.  [place], ACSM 10/12/2020 8:56 AM

## 2020-10-12 NOTE — Progress Notes (Signed)
RT called to patient room due to piece missing from patient's CPAP. No piece was missing from CPAP. Patient has pressure line piece in line with his CPAP so oxygen could be bled into CPAP. Patient stated he was told he did not need oxygen anymore and there was cap on the pressure line. RT does not have any caps for the pressure line. This piece was taken out of line.   Patient already on CPAP for tonight.

## 2020-10-12 NOTE — Progress Notes (Signed)
9 Days Post-Op Procedure(s) (LRB): CORONARY ARTERY BYPASS GRAFTING (CABG) X FOUR, USING BILATERAL MAMMARY ARTERIES AND RIGHT ENDOSCOPIC GREATER SAPHENOUS VEIN CONDUITS (N/A) TRANSESOPHAGEAL ECHOCARDIOGRAM (TEE) (N/A) INDOCYANINE GREEN FLUORESCENCE IMAGING (ICG) (N/A) APPLICATION OF CELL SAVER (N/A) CLIPPING OF ATRIAL APPENDAGE WITH ATRICURE 40 CLIP (Left) ATRIAL SEPTAL DEFECT (ASD) REPAIR WITH PERI GUARD PERICARDIUM (N/A) Subjective: Wants to know when he can go home.    Objective: Vital signs in last 24 hours: Temp:  [97.6 F (36.4 C)-98.3 F (36.8 C)] 98.3 F (36.8 C) (06/25 1140) Pulse Rate:  [74-98] 90 (06/25 1140) Cardiac Rhythm: Normal sinus rhythm (06/25 0700) Resp:  [12-20] 19 (06/25 1140) BP: (100-123)/(56-76) 112/74 (06/25 1140) SpO2:  [96 %-100 %] 96 % (06/25 1140) Weight:  [138.6 kg] 138.6 kg (06/25 0500)  Hemodynamic parameters for last 24 hours:    Intake/Output from previous day: 06/24 0701 - 06/25 0700 In: 577 [P.O.:577] Out: 2300 [Urine:2300] Intake/Output this shift: Total I/O In: 480 [P.O.:480] Out: 700 [Urine:700]  General appearance: alert and cooperative Neurologic: intact Heart: regular rate and rhythm, S1, S2 normal, no murmur Lungs: clear to auscultation bilaterally Abdomen: soft, non-tender; bowel sounds normal Extremities: edema 3+ in legs Wound: incisions ok  Lab Results: Recent Labs    10/11/20 0051  WBC 7.3  HGB 9.1*  HCT 27.2*  PLT 327   BMET:  Recent Labs    10/10/20 0416 10/11/20 0051  NA 127* 131*  K 4.0 3.8  CL 95* 97*  CO2 20* 23  GLUCOSE 100* 106*  BUN 67* 67*  CREATININE 1.86* 1.73*  CALCIUM 8.1* 7.9*    PT/INR: No results for input(s): LABPROT, INR in the last 72 hours. ABG    Component Value Date/Time   PHART 7.280 (L) 10/03/2020 2146   HCO3 20.5 10/03/2020 2146   TCO2 22 10/03/2020 2146   ACIDBASEDEF 6.0 (H) 10/03/2020 2146   O2SAT 97.0 10/03/2020 2146   CBG (last 3)  No results for input(s):  GLUCAP in the last 72 hours.  Assessment/Plan: S/P Procedure(s) (LRB): CORONARY ARTERY BYPASS GRAFTING (CABG) X FOUR, USING BILATERAL MAMMARY ARTERIES AND RIGHT ENDOSCOPIC GREATER SAPHENOUS VEIN CONDUITS (N/A) TRANSESOPHAGEAL ECHOCARDIOGRAM (TEE) (N/A) INDOCYANINE GREEN FLUORESCENCE IMAGING (ICG) (N/A) APPLICATION OF CELL SAVER (N/A) CLIPPING OF ATRIAL APPENDAGE WITH ATRICURE 40 CLIP (Left) ATRIAL SEPTAL DEFECT (ASD) REPAIR WITH PERI GUARD PERICARDIUM (N/A)  Hemodynamically stable. Rhythm is regular but may be atrial flutter or sinus. Rate 70's on amio and lopressor. Will check ECG. I think he could get on Eliquis if needed.  Postop AKI: creat improving with good diuresis on oral lasix. -1700 cc yesterday and wt down to 305. Still 15 lbs over preop and with LE edema so will continue diuresis.  Continue IS, ambulation.  Postop ileus resolved.  LOS: 12 days    Gaye Pollack 10/12/2020

## 2020-10-12 NOTE — Progress Notes (Signed)
Progress Note  Patient Name: Matthew Savage Date of Encounter: 10/12/2020  Primary Cardiologist: Werner Lean, MD   Subjective   "I think I am getting better."  Inpatient Medications    Scheduled Meds:  allopurinol  300 mg Oral Daily   amiodarone  200 mg Oral BID   aspirin EC  325 mg Oral Daily   atorvastatin  80 mg Oral Daily   bisacodyl  10 mg Oral Daily   Or   bisacodyl  10 mg Rectal Daily   colchicine  0.3 mg Oral BID   docusate sodium  200 mg Oral Daily   enoxaparin (LOVENOX) injection  30 mg Subcutaneous Q24H   feeding supplement  237 mL Oral BID BM   ferrous [id number] C-folic acid  1 capsule Oral BID PC   furosemide  40 mg Oral Daily   isosorbide dinitrate  10 mg Oral TID   metoprolol tartrate  12.5 mg Oral BID   pantoprazole  40 mg Oral Daily   potassium chloride  10 mEq Oral Daily   Continuous Infusions:  sodium chloride     sodium chloride 20 mL/hr at 10/03/20 1507   PRN Meds: dextrose, ipratropium-albuterol, metoprolol tartrate, ondansetron (ZOFRAN) IV, oxyCODONE, sodium chloride flush, sodium chloride flush, traMADol   Vital Signs    Vitals:   10/12/20 0300 10/12/20 0500 10/12/20 0730 10/12/20 0838  BP: 106/68  100/76 115/73  Pulse: 80  98 90  Resp: 19  20   Temp: 97.9 F (36.6 C)  98.1 F (36.7 C)   TempSrc: Oral  Oral   SpO2: 100%  96%   Weight:  (!) 138.6 kg    Height:        Intake/Output Summary (Last 24 hours) at 10/12/2020 0920 Last data filed at 10/12/2020 0755 Gross per 24 hour  Intake 477 ml  Output 2800 ml  Net -2323 ml   Filed Weights   10/10/20 0523 10/11/20 0630 10/12/20 0500  Weight: (!) 141.9 kg (!) 138.8 kg (!) 138.6 kg    Telemetry    Atrial fib/flutter with a RVR - Personally Reviewed  ECG    none - Personally Reviewed  Physical Exam   GEN: obese 60 yo man, No acute distress.   Neck: 7 cm JVD Cardiac: IRIRR, no murmurs, rubs, or gallops.  Respiratory: Clear to auscultation bilaterally.  Rales in the bases GI: Soft, nontender, non-distended  MS: No edema; No deformity. Neuro:  Nonfocal  Psych: Normal affect   Labs    Chemistry Recent Labs  Lab 10/07/20 0420 10/07/20 1408 10/08/20 0055 10/09/20 0038 10/10/20 0416 10/11/20 0051  NA 129* 125* 127* 126* 127* 131*  K 4.9 4.6 4.7 4.2 4.0 3.8  CL 95* 94* 96* 93* 95* 97*  CO2 21* 23 21* 24 20* 23  GLUCOSE 112* 106* 104* 103* 100* 106*  BUN 48* 53* 57* 64* 67* 67*  CREATININE 2.73* 2.77* 2.54* 2.45* 1.86* 1.73*  CALCIUM 8.9 8.4* 8.3* 8.2* 8.1* 7.9*  PROT 6.0* 6.0* 5.4*  --   --   --   ALBUMIN 2.9* 2.8* 2.6*  --   --   --   AST 23 23 20   --   --   --   ALT 8 9 9   --   --   --   ALKPHOS 65 66 62  --   --   --   BILITOT 0.5 0.9 0.9  --   --   --   NLZJQBHA  26* 25* 28* 29* 41* 45*  ANIONGAP 13 8 10 9 12 11      Hematology Recent Labs  Lab 10/08/20 0055 10/09/20 0038 10/11/20 0051  WBC 8.0 8.7 7.3  RBC 2.54* 2.47* 2.86*  HGB 8.2* 7.9* 9.1*  HCT 25.3* 24.3* 27.2*  MCV 99.6 98.4 95.1  MCH 32.3 32.0 31.8  MCHC 32.4 32.5 33.5  RDW 14.2 14.3 14.6  PLT 227 273 327    Cardiac EnzymesNo results for input(s): TROPONINI in the last 168 hours. No results for input(s): TROPIPOC in the last 168 hours.   BNPNo results for input(s): BNP, PROBNP in the last 168 hours.   DDimer No results for input(s): DDIMER in the last 168 hours.   Radiology    DG Abd Portable 1V  Result Date: 10/10/2020 CLINICAL DATA:  Evaluate ileus EXAM: PORTABLE ABDOMEN - 1 VIEW COMPARISON:  CT AP 10/09/2020 FINDINGS: Gastric band identified. Gaseous distension of the stomach is again noted, unchanged. Persistent gaseous distension of the small bowel loops with gas and stool noted throughout the colon up to the rectum. IMPRESSION: Persistent gaseous distension of the stomach and small bowel loops compatible with either ileus or partial small bowel obstruction. Electronically Signed   By: Kerby Moors M.D.   On: 10/10/2020 11:08   VAS [place] LOWER  EXTREMITY VENOUS (DVT)  Result Date: 10/11/2020  Lower Venous DVT Study Patient Name:  Matthew Savage  Date of Exam:   10/11/2020 Medical Rec #: [id number]       Accession #:    [id number] Date of Birth: 05-22-1960       Patient Gender: M Patient Age:   60Y Exam Location:  [facility] Procedure:      VAS [place] LOWER EXTREMITY VENOUS (DVT) Referring Phys: [id number] WAYNE E GOLD --------------------------------------------------------------------------------  Indications: Pain and swelling LT>RT, history of DVT LT, anticoagulation therapy held s/p CABG.  Comparison Study: 12-24-2016 Most recent prior bilateral lower extremity venous                   was positive for DVT involving the LEFT Femoral V and Pop V. Performing Technologist: Darlin Coco RDMS,RVT  Examination Guidelines: A complete evaluation includes B-mode imaging, spectral Doppler, color Doppler, and power Doppler as needed of all accessible portions of each vessel. Bilateral testing is considered an integral part of a complete examination. Limited examinations for reoccurring indications may be performed as noted. The reflux portion of the exam is performed with the patient in reverse Trendelenburg.  +---------+---------------+---------+-----------+----------+--------------+ RIGHT    CompressibilityPhasicitySpontaneityPropertiesThrombus Aging +---------+---------------+---------+-----------+----------+--------------+ CFV      Full           Yes      Yes                                 +---------+---------------+---------+-----------+----------+--------------+ SFJ      Full                                                        +---------+---------------+---------+-----------+----------+--------------+ FV Prox  Full                                                        +---------+---------------+---------+-----------+----------+--------------+  FV Mid   Full                                                         +---------+---------------+---------+-----------+----------+--------------+ FV DistalFull                                                        +---------+---------------+---------+-----------+----------+--------------+ PFV      Full                                                        +---------+---------------+---------+-----------+----------+--------------+ POP      Full           Yes      Yes                                 +---------+---------------+---------+-----------+----------+--------------+ PTV      Full                                                        +---------+---------------+---------+-----------+----------+--------------+ PERO     Full                                                        +---------+---------------+---------+-----------+----------+--------------+   +---------+---------------+---------+-----------+---------------+--------------+ LEFT     CompressibilityPhasicitySpontaneityProperties     Thrombus Aging +---------+---------------+---------+-----------+---------------+--------------+ CFV      Full           Yes      Yes                                      +---------+---------------+---------+-----------+---------------+--------------+ SFJ      Full                                                             +---------+---------------+---------+-----------+---------------+--------------+ FV Prox  Full                                                             +---------+---------------+---------+-----------+---------------+--------------+ FV Mid   Full                                                             +---------+---------------+---------+-----------+---------------+--------------+  FV DistalFull                                                             +---------+---------------+---------+-----------+---------------+--------------+ PFV      Full                                                              +---------+---------------+---------+-----------+---------------+--------------+ POP      Partial        Yes      Yes        Fibrin         Chronic                                                    stranding                     +---------+---------------+---------+-----------+---------------+--------------+ PTV      Full                                                             +---------+---------------+---------+-----------+---------------+--------------+ PERO     Full                                                             +---------+---------------+---------+-----------+---------------+--------------+     Summary: RIGHT: - There is no evidence of deep vein thrombosis in the lower extremity.  - No cystic structure found in the popliteal fossa.  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  - No cystic structure found in the popliteal fossa.  *See table(s) above for measurements and observations.    Preliminary     Cardiac Studies   none  Patient Profile     60 y.o. male admitted with [place] s/p CABG  Assessment & Plan    Post op atrial fib/flutter - we will increase his dose of metoprolol. Continue amiodarone. He will need anti-coagulation. Start Eliquis when ok with CT surgery. CAD - he is s/p CABG, stable. Volume overload - he has 3+ edema in the legs. Consider switching to IV lasix and use support stockings as tolerated.      For questions or updates, please contact [place] Please consult [email or web address] for contact info under Cardiology/STEMI.      Signed, Cristopher Peru, MD  10/12/2020, 9:20 AM

## 2020-10-12 NOTE — Progress Notes (Signed)
Mobility Specialist: Progress Note   10/12/20 1820  Mobility  Activity Ambulated in hall  Level of Assistance Modified independent, requires aide device or extra time  Assistive Device Front wheel walker  Distance Ambulated (ft) 400 ft  Mobility Ambulated with assistance in hallway  Mobility Response Tolerated well  Mobility performed by Mobility specialist  $Mobility charge 1 Mobility   Pre-Mobility: 110 HR Post-Mobility: 107 HR  Pt asx throughout ambulation. Pt back to bed per request. Pt set-up with SCDs and CPAP. Only one side of the SCD is working, Print production planner.   [facility] Matthew Savage Mobility Specialist Mobility Specialist Phone: [phone]

## 2020-10-13 LAB — BASIC METABOLIC PANEL
Anion gap: 8 (ref 5–15)
BUN: 46 mg/dL — ABNORMAL HIGH (ref 6–20)
CO2: 27 mmol/L (ref 22–32)
Calcium: 8.2 mg/dL — ABNORMAL LOW (ref 8.9–10.3)
Chloride: 100 mmol/L (ref 98–111)
Creatinine, Ser: 1.52 mg/dL — ABNORMAL HIGH (ref 0.61–1.24)
GFR, Estimated: 52 mL/min — ABNORMAL LOW (ref >60)
Glucose, Bld: 104 mg/dL — ABNORMAL HIGH (ref 70–99)
Potassium: 3.7 mmol/L (ref 3.5–5.1)
Sodium: 135 mmol/L (ref 135–145)

## 2020-10-13 MED ORDER — METOLAZONE 2.5 MG PO TABS
5.0000 mg | ORAL_TABLET | Freq: Once | ORAL | Status: AC
  Administered 2020-10-13: 5 mg via ORAL
  Filled 2020-10-13: qty 2

## 2020-10-13 MED ORDER — FUROSEMIDE 10 MG/ML IJ SOLN
40.0000 mg | Freq: Two times a day (BID) | INTRAMUSCULAR | Status: DC
  Administered 2020-10-13 (×2): 40 mg via INTRAVENOUS
  Filled 2020-10-13 (×2): qty 4

## 2020-10-13 MED ORDER — POTASSIUM CHLORIDE CRYS ER 20 MEQ PO TBCR
40.0000 meq | EXTENDED_RELEASE_TABLET | Freq: Two times a day (BID) | ORAL | Status: DC
  Administered 2020-10-13 (×2): 40 meq via ORAL
  Filled 2020-10-13 (×2): qty 2

## 2020-10-13 MED ORDER — METOPROLOL TARTRATE 25 MG PO TABS
25.0000 mg | ORAL_TABLET | Freq: Two times a day (BID) | ORAL | Status: DC
  Administered 2020-10-13 – 2020-10-24 (×20): 25 mg via ORAL
  Filled 2020-10-13 (×22): qty 1

## 2020-10-13 NOTE — Plan of Care (Signed)
  Problem: Education: Goal: Knowledge of General Education information will improve Description: Including pain rating scale, medication(s)/side effects and non-pharmacologic comfort measures Outcome: Progressing   Problem: Clinical Measurements: Goal: Ability to maintain clinical measurements within normal limits will improve Outcome: Progressing Goal: Will remain free from infection Outcome: Progressing Goal: Respiratory complications will improve Outcome: Progressing   Problem: Activity: Goal: Risk for activity intolerance will decrease Outcome: Progressing   Problem: Nutrition: Goal: Adequate nutrition will be maintained Outcome: Progressing   Problem: Coping: Goal: Level of anxiety will decrease Outcome: Progressing   

## 2020-10-13 NOTE — Progress Notes (Addendum)
10 Days Post-Op Procedure(s) (LRB): CORONARY ARTERY BYPASS GRAFTING (CABG) X FOUR, USING BILATERAL MAMMARY ARTERIES AND RIGHT ENDOSCOPIC GREATER SAPHENOUS VEIN CONDUITS (N/A) TRANSESOPHAGEAL ECHOCARDIOGRAM (TEE) (N/A) INDOCYANINE GREEN FLUORESCENCE IMAGING (ICG) (N/A) APPLICATION OF CELL SAVER (N/A) CLIPPING OF ATRIAL APPENDAGE WITH ATRICURE 40 CLIP (Left) ATRIAL SEPTAL DEFECT (ASD) REPAIR WITH PERI GUARD PERICARDIUM (N/A) Subjective:  Awake and alert, anxious to go home.  No new problems.   Walked in the hall yesterday.     Objective: Vital signs in last 24 hours: Temp:  [97.3 F (36.3 C)-98.7 F (37.1 C)] 98.7 F (37.1 C) (06/26 1133) Pulse Rate:  [78-102] 92 (06/26 1133) Cardiac Rhythm: Atrial flutter (06/26 0700) Resp:  [14-18] 18 (06/26 1133) BP: (99-116)/(65-80) 116/73 (06/26 1133) SpO2:  [92 %-98 %] 98 % (06/26 1133) Weight:  [137.5 kg] 137.5 kg (06/26 0300)  Hemodynamic parameters for last 24 hours:    Intake/Output from previous day: 06/25 0701 - 06/26 0700 In: 1200 [P.O.:1200] Out: 1800 [Urine:1800] Intake/Output this shift: Total I/O In: 240 [P.O.:240] Out: 600 [Urine:600]  General appearance: alert and cooperative Neurologic: intact Heart: Irregular rhythm. A-sib with periods of a-flutter on monitor.  Lungs: clear to auscultation bilaterally Abdomen: soft, non-tender; bowel sounds normal Extremities: edema 3+ in legs Wound: incisions ok  Lab Results: Recent Labs    10/11/20 0051  WBC 7.3  HGB 9.1*  HCT 27.2*  PLT 327    BMET:  Recent Labs    10/11/20 0051 10/13/20 0059  NA 131* 135  K 3.8 3.7  CL 97* 100  CO2 23 27  GLUCOSE 106* 104*  BUN 67* 46*  CREATININE 1.73* 1.52*  CALCIUM 7.9* 8.2*     PT/INR: No results for input(s): LABPROT, INR in the last 72 hours. ABG    Component Value Date/Time   PHART 7.280 (L) 10/03/2020 2146   HCO3 20.5 10/03/2020 2146   TCO2 22 10/03/2020 2146   ACIDBASEDEF 6.0 (H) 10/03/2020 2146   O2SAT  97.0 10/03/2020 2146   CBG (last 3)  No results for input(s): GLUCAP in the last 72 hours.  Assessment/Plan: S/P Procedure(s) (LRB): CORONARY ARTERY BYPASS GRAFTING (CABG) X FOUR, USING BILATERAL MAMMARY ARTERIES AND RIGHT ENDOSCOPIC GREATER SAPHENOUS VEIN CONDUITS (N/A) TRANSESOPHAGEAL ECHOCARDIOGRAM (TEE) (N/A) INDOCYANINE GREEN FLUORESCENCE IMAGING (ICG) (N/A) APPLICATION OF CELL SAVER (N/A) CLIPPING OF ATRIAL APPENDAGE WITH ATRICURE 40 CLIP (Left) ATRIAL SEPTAL DEFECT (ASD) REPAIR WITH PERI GUARD PERICARDIUM (N/A)   -POD-10 CABG x 4.  BP stable, slow progress with mobility.  On aspirin, statin, beta-blocker.  -Atrial flutter / fib- History of ablation procedure last year. Rate is controlled. Continue amiodarone and Xarelto  Supplementing K+.   -Postop AKI: creat improving, still several lbs above pre-op Wt and has LE edema. Continue Lasix, Zaroxolyn po x 1 dose today.   -Continue IS, ambulation.  -Postop ileus resolved.  LOS: 13 days    Matthew Savage [phone] 10/13/2020   Chart reviewed, patient examined, agree with above. Still about 13 lbs up from preop with significant LE edema. Will give a dose of metolazone with lasix today. Renal function improving. Replace K+.

## 2020-10-13 NOTE — Progress Notes (Signed)
Progress Note  Patient Name: Matthew Savage Date of Encounter: 10/13/2020  Primary Cardiologist: Werner Lean, MD   Subjective   No chest pain or sob  Inpatient Medications    Scheduled Meds:  allopurinol  300 mg Oral Daily   amiodarone  200 mg Oral BID   aspirin EC  325 mg Oral Daily   atorvastatin  80 mg Oral Daily   bisacodyl  10 mg Oral Daily   Or   bisacodyl  10 mg Rectal Daily   colchicine  0.3 mg Oral BID   docusate sodium  200 mg Oral Daily   feeding supplement  237 mL Oral BID BM   ferrous [id number] C-folic acid  1 capsule Oral BID PC   furosemide  40 mg Oral BID   isosorbide dinitrate  10 mg Oral TID   metoprolol tartrate  12.5 mg Oral BID   pantoprazole  40 mg Oral Daily   potassium chloride  20 mEq Oral TID   rivaroxaban  20 mg Oral Q supper   Continuous Infusions:  sodium chloride     sodium chloride 20 mL/hr at 10/03/20 1507   PRN Meds: dextrose, ipratropium-albuterol, metoprolol tartrate, ondansetron (ZOFRAN) IV, oxyCODONE, simethicone, sodium chloride flush, sodium chloride flush, traMADol   Vital Signs    Vitals:   10/12/20 1900 10/12/20 2300 10/13/20 0300 10/13/20 0835  BP: 113/75 99/65 105/66 109/80  Pulse: (!) 102 78 90 90  Resp: 15 16 18 16   Temp: 97.8 F (36.6 C) 97.6 F (36.4 C) 98.1 F (36.7 C) 98.2 F (36.8 C)  TempSrc: Oral Oral Oral Oral  SpO2: 96% 92% 97% 98%  Weight:   (!) 137.5 kg   Height:        Intake/Output Summary (Last 24 hours) at 10/13/2020 0901 Last data filed at 10/13/2020 0800 Gross per 24 hour  Intake 1200 ml  Output 1300 ml  Net -100 ml   Filed Weights   10/11/20 0630 10/12/20 0500 10/13/20 0300  Weight: (!) 138.8 kg (!) 138.6 kg (!) 137.5 kg    Telemetry    Atypical atrial flutter with a cvr/rvr - Personally Reviewed  ECG    Atypical atrial flutter with 3:1 conduction - Personally Reviewed  Physical Exam   GEN: No acute distress.   Neck: No JVD Cardiac: IRRR, no murmurs,  rubs, or gallops.  Respiratory: Clear to auscultation bilaterally except for basilar rales. GI: Soft, nontender, non-distended  MS: 2+ edema; No deformity. Neuro:  Nonfocal  Psych: Normal affect   Labs    Chemistry Recent Labs  Lab 10/07/20 0420 10/07/20 1408 10/08/20 0055 10/09/20 0038 10/10/20 0416 10/11/20 0051 10/13/20 0059  NA 129* 125* 127*   < > 127* 131* 135  K 4.9 4.6 4.7   < > 4.0 3.8 3.7  CL 95* 94* 96*   < > 95* 97* 100  CO2 21* 23 21*   < > 20* 23 27  GLUCOSE 112* 106* 104*   < > 100* 106* 104*  BUN 48* 53* 57*   < > 67* 67* 46*  CREATININE 2.73* 2.77* 2.54*   < > 1.86* 1.73* 1.52*  CALCIUM 8.9 8.4* 8.3*   < > 8.1* 7.9* 8.2*  PROT 6.0* 6.0* 5.4*  --   --   --   --   ALBUMIN 2.9* 2.8* 2.6*  --   --   --   --   AST 23 23 20   --   --   --   --  ALT 8 9 9   --   --   --   --   ALKPHOS 65 66 62  --   --   --   --   BILITOT 0.5 0.9 0.9  --   --   --   --   GFRNONAA 26* 25* 28*   < > 41* 45* 52*  ANIONGAP 13 8 10    < > 12 11 8    < > = values in this interval not displayed.     Hematology Recent Labs  Lab 10/08/20 0055 10/09/20 0038 10/11/20 0051  WBC 8.0 8.7 7.3  RBC 2.54* 2.47* 2.86*  HGB 8.2* 7.9* 9.1*  HCT 25.3* 24.3* 27.2*  MCV 99.6 98.4 95.1  MCH 32.3 32.0 31.8  MCHC 32.4 32.5 33.5  RDW 14.2 14.3 14.6  PLT 227 273 327    Cardiac EnzymesNo results for input(s): TROPONINI in the last 168 hours. No results for input(s): TROPIPOC in the last 168 hours.   BNPNo results for input(s): BNP, PROBNP in the last 168 hours.   DDimer No results for input(s): DDIMER in the last 168 hours.   Radiology    VAS [place] LOWER EXTREMITY VENOUS (DVT)  Result Date: 10/12/2020  Lower Venous DVT Study Patient Name:  Mat JAIZON DEROOS  Date of Exam:   10/11/2020 Medical Rec #: [id number]       Accession #:    [id number] Date of Birth: 1960/12/30       Patient Gender: M Patient Age:   60Y Exam Location:  [facility] Procedure:      VAS [place] LOWER EXTREMITY VENOUS (DVT)  Referring Phys: [id number] WAYNE E GOLD --------------------------------------------------------------------------------  Indications: Pain and swelling LT>RT, history of DVT LT, anticoagulation therapy held s/p CABG.  Comparison Study: 12-24-2016 Most recent prior bilateral lower extremity venous                   was positive for DVT involving the LEFT Femoral V and Pop V. Performing Technologist: Darlin Coco RDMS,RVT  Examination Guidelines: A complete evaluation includes B-mode imaging, spectral Doppler, color Doppler, and power Doppler as needed of all accessible portions of each vessel. Bilateral testing is considered an integral part of a complete examination. Limited examinations for reoccurring indications may be performed as noted. The reflux portion of the exam is performed with the patient in reverse Trendelenburg.  +---------+---------------+---------+-----------+----------+--------------+ RIGHT    CompressibilityPhasicitySpontaneityPropertiesThrombus Aging +---------+---------------+---------+-----------+----------+--------------+ CFV      Full           Yes      Yes                                 +---------+---------------+---------+-----------+----------+--------------+ SFJ      Full                                                        +---------+---------------+---------+-----------+----------+--------------+ FV Prox  Full                                                        +---------+---------------+---------+-----------+----------+--------------+ FV  Mid   Full                                                        +---------+---------------+---------+-----------+----------+--------------+ FV DistalFull                                                        +---------+---------------+---------+-----------+----------+--------------+ PFV      Full                                                         +---------+---------------+---------+-----------+----------+--------------+ POP      Full           Yes      Yes                                 +---------+---------------+---------+-----------+----------+--------------+ PTV      Full                                                        +---------+---------------+---------+-----------+----------+--------------+ PERO     Full                                                        +---------+---------------+---------+-----------+----------+--------------+   +---------+---------------+---------+-----------+---------------+--------------+ LEFT     CompressibilityPhasicitySpontaneityProperties     Thrombus Aging +---------+---------------+---------+-----------+---------------+--------------+ CFV      Full           Yes      Yes                                      +---------+---------------+---------+-----------+---------------+--------------+ SFJ      Full                                                             +---------+---------------+---------+-----------+---------------+--------------+ FV Prox  Full                                                             +---------+---------------+---------+-----------+---------------+--------------+ FV Mid   Full                                                             +---------+---------------+---------+-----------+---------------+--------------+  FV DistalFull                                                             +---------+---------------+---------+-----------+---------------+--------------+ PFV      Full                                                             +---------+---------------+---------+-----------+---------------+--------------+ POP      Partial        Yes      Yes        Fibrin         Chronic                                                    stranding                      +---------+---------------+---------+-----------+---------------+--------------+ PTV      Full                                                             +---------+---------------+---------+-----------+---------------+--------------+ PERO     Full                                                             +---------+---------------+---------+-----------+---------------+--------------+     Summary: RIGHT: - There is no evidence of deep vein thrombosis in the lower extremity.  - No cystic structure found in the popliteal fossa.  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  - No cystic structure found in the popliteal fossa.  *See table(s) above for measurements and observations. Electronically signed by Servando Snare MD on 10/12/2020 at 1:37:33 PM.    Final     Cardiac Studies   none  Patient Profile     60 y.o. male admitted with 3vessel CAD, s/p CABG  Assessment & Plan    Post op atrial flutter - note that he has been started on eliquis. Rates are fairly well controlled. Chronic diastolic heart failure/volume overload - diurese with IV lasix CAD - he is s/p CABG     For questions or updates, please contact [place] Please consult [email or web address] for contact info under Cardiology/STEMI.      Signed, Cristopher Peru, MD  10/13/2020, 9:01 AM   Patient ID: [name], male   DOB: 01/26/1961, 60 y.o.   MRN: [id number]

## 2020-10-13 NOTE — Progress Notes (Signed)
Pt has home CPAP.  

## 2020-10-14 ENCOUNTER — Inpatient Hospital Stay (HOSPITAL_COMMUNITY): Payer: BC Managed Care – PPO

## 2020-10-14 LAB — BASIC METABOLIC PANEL
Anion gap: 9 (ref 5–15)
BUN: 42 mg/dL — ABNORMAL HIGH (ref 6–20)
CO2: 28 mmol/L (ref 22–32)
Calcium: 8.4 mg/dL — ABNORMAL LOW (ref 8.9–10.3)
Chloride: 94 mmol/L — ABNORMAL LOW (ref 98–111)
Creatinine, Ser: 1.57 mg/dL — ABNORMAL HIGH (ref 0.61–1.24)
GFR, Estimated: 50 mL/min — ABNORMAL LOW (ref >60)
Glucose, Bld: 104 mg/dL — ABNORMAL HIGH (ref 70–99)
Potassium: 4.1 mmol/L (ref 3.5–5.1)
Sodium: 131 mmol/L — ABNORMAL LOW (ref 135–145)

## 2020-10-14 LAB — URINALYSIS, ROUTINE W REFLEX MICROSCOPIC
Bacteria, UA: NONE SEEN
Bilirubin Urine: NEGATIVE
Glucose, UA: NEGATIVE mg/dL
Ketones, ur: NEGATIVE mg/dL
Leukocytes,Ua: NEGATIVE
Nitrite: NEGATIVE
Protein, ur: NEGATIVE mg/dL
Specific Gravity, Urine: 1.005 (ref 1.005–1.030)
pH: 5 (ref 5.0–8.0)

## 2020-10-14 LAB — CBC
HCT: 26.6 % — ABNORMAL LOW (ref 39.0–52.0)
Hemoglobin: 8.8 g/dL — ABNORMAL LOW (ref 13.0–17.0)
MCH: 32.1 pg (ref 26.0–34.0)
MCHC: 33.1 g/dL (ref 30.0–36.0)
MCV: 97.1 fL (ref 80.0–100.0)
Platelets: 415 10*3/uL — ABNORMAL HIGH (ref 150–400)
RBC: 2.74 MIL/uL — ABNORMAL LOW (ref 4.22–5.81)
RDW: 15.1 % (ref 11.5–15.5)
WBC: 16.4 10*3/uL — ABNORMAL HIGH (ref 4.0–10.5)
nRBC: 0 % (ref 0.0–0.2)

## 2020-10-14 LAB — MAGNESIUM: Magnesium: 1.5 mg/dL — ABNORMAL LOW (ref 1.7–2.4)

## 2020-10-14 LAB — URIC ACID: Uric Acid, Serum: 9.3 mg/dL — ABNORMAL HIGH (ref 3.7–8.6)

## 2020-10-14 MED ORDER — MAGNESIUM OXIDE -MG SUPPLEMENT 400 (240 MG) MG PO TABS
400.0000 mg | ORAL_TABLET | Freq: Every day | ORAL | Status: DC
  Administered 2020-10-14 – 2020-10-15 (×2): 400 mg via ORAL
  Filled 2020-10-14 (×2): qty 1

## 2020-10-14 MED ORDER — FUROSEMIDE 10 MG/ML IJ SOLN: 40.0000 mg | Freq: Every day | INTRAMUSCULAR | Status: DC

## 2020-10-14 MED ORDER — POTASSIUM CHLORIDE CRYS ER 20 MEQ PO TBCR
30.0000 meq | EXTENDED_RELEASE_TABLET | Freq: Two times a day (BID) | ORAL | Status: DC
  Administered 2020-10-14 (×2): 30 meq via ORAL
  Filled 2020-10-14 (×2): qty 1

## 2020-10-14 MED ORDER — FUROSEMIDE 10 MG/ML IJ SOLN
40.0000 mg | Freq: Two times a day (BID) | INTRAMUSCULAR | Status: DC
  Administered 2020-10-14 – 2020-10-15 (×3): 40 mg via INTRAVENOUS
  Filled 2020-10-14 (×3): qty 4

## 2020-10-14 MED ORDER — POTASSIUM CHLORIDE CRYS ER 20 MEQ PO TBCR: 40.0000 meq | EXTENDED_RELEASE_TABLET | Freq: Every day | ORAL | Status: DC

## 2020-10-14 NOTE — Progress Notes (Signed)
Pt c/o significant feet pain from swelling. Refuses ambulation. Sts he can only walk to BR. He is now keeping his feet elevated today. He is practicing IS. Gave pt address and phone # of [facility] in [place].  [place], ACSM 1:58 PM 10/14/2020

## 2020-10-14 NOTE — Progress Notes (Signed)
Physical Therapy Treatment Patient Details Name: Matthew Savage MRN: [id number] DOB: 01/21/1961 Today's Date: 10/14/2020    History of Present Illness Pt is a 60 y/o male presenting on 6/16 for CABG with TEE, and ICG with diagnosis of CAD. Pt extubated 6/16. Also, on 10/13/20 noted in MD notes pt with volume overload/chronic diastolic HF with lasix initiated to diurese. PMH includes: hematuria, class II obesity, DDD, chronic DVT, gout, heart murmur, hyperlipidemia, hypertension, lap band surgery, numbness and tingling, SCC of face, mild AVS, TAA, atrial fibrillation/typical atrial flutter with ablation, CAD multiple MIs.    PT Comments    PT with limited session due to pt with edema in bil LE and c/o pain.  He does not want to ambulate in hallways until edema/pain improved.  Pt is performing sit to stand and ambulation in room independently.  He does still require assist for bed mobility, positioning, and ADLs due to sternal precautions - pt lives alone with no one to assist so continue to recommend SNF.  Session focused on addressing sternal precautions and educating on benefits of elevating legs to assist with edema.  Assisted pt in finding most comfortable position possible with legs elevated.      Follow Up Recommendations  SNF (lives alone)     Equipment Recommendations  Rolling walker with 5" wheels;3in1 (PT)    Recommendations for Other Services       Precautions / Restrictions Precautions Precautions: Fall;Sternal Precaution Booklet Issued: Yes (comment) Precaution Comments: . Restrictions Other Position/Activity Restrictions: sternal precautions    Mobility  Bed Mobility               General bed mobility comments: in chair at arrival    Transfers Overall transfer level: Needs assistance Equipment used: None Transfers: Sit to/from Stand Sit to Stand: Independent         General transfer comment: Pt has been getting up from seated position and going to  bathroom independently.  Requiring assist for toileting ADLs due to sternal precautions.  Ambulation/Gait Ambulation/Gait assistance: Independent Gait Distance (Feet): 10 Feet (10'x2) Assistive device: None Gait Pattern/deviations: Step-through pattern     General Gait Details: Pt ambulating to/from bathroom independently.  Declining further ambulation "until they do something about feet swelling."   Stairs             Wheelchair Mobility    Modified Rankin (Stroke Patients Only)       Balance Overall balance assessment: Needs assistance Sitting-balance support: Feet supported Sitting balance-Leahy Scale: Good     Standing balance support: No upper extremity supported Standing balance-Leahy Scale: Good                              Cognition Arousal/Alertness: Awake/alert Behavior During Therapy: Flat affect Overall Cognitive Status: Within Functional Limits for tasks assessed                                 General Comments: Some decrease in awareness of medical conditions.  Reports he told doctor "I had afib? that could have killed me? so that means I had a heart attack." Also, when mentioned volume overload and diastolic HF from cardiology note - pt states he never had heart failure.  Pt with c/o swelling in feet but reports sits with them down frequently b/c uncomfortable up and has a hard time getting out of  chair with them up to get to bathroom quickly.      Exercises      General Comments General comments (skin integrity, edema, etc.): Provided education on sternal precautions with transfers - pt has handout.  Pt aware of precautions as he reports he can't reach to use lever to lift legs of recliner up b/c of precautions.  Also, pt with severe edema in bil LE that is causing pain with ambulation.  Pt expressing frustration in regards to edema and asking what could be done.  He asked about compression -discussed noted that he is in  voulme overload with chronic diastolic HF per cardiology note, so may not want to compress at this time.  Did encourage ankle pumps, quad sets/heel slides, and gluteal squeezes to help muscles pump fluid up out of legs.  MDs do have him on lasix.  Also, encouraged him to keep feet elevated.  He reports difficulty with this b/c needing to get to bathroom quickly.  States if in bed, he can't get up quickly and recliner very uncomfortable on his legs and has difficulty getting them up himself.  Tried placing chair under feet but pt felt knees hyperextended. Assisted pt in elevating leg rest and placed blankets folded under legs (pillows not available) and chair under feet as legs longer than foot rest - pt reports that is morecomfortable but unsure how he would get back to this position on his own - discussed would need to call nursing for assist.      Pertinent Vitals/Pain Pain Assessment: No/denies pain    Home Living                      Prior Function            PT Goals (current goals can now be found in the care plan section) Acute Rehab PT Goals Patient Stated Goal: get stronger PT Goal Formulation: With patient Time For Goal Achievement: 10/19/20 Potential to Achieve Goals: Good Progress towards PT goals: Progressing toward goals    Frequency    Min 3X/week      PT Plan Frequency needs to be updated    Co-evaluation              AM-PAC PT "6 Clicks" Mobility   Outcome Measure  Help needed turning from your back to your side while in a flat bed without using bedrails?: A Little Help needed moving from lying on your back to sitting on the side of a flat bed without using bedrails?: A Little Help needed moving to and from a bed to a chair (including a wheelchair)?: A Little Help needed standing up from a chair using your arms (e.g., wheelchair or bedside chair)?: None Help needed to walk in hospital room?: None Help needed climbing 3-5 steps with a railing? :  A Little 6 Click Score: 20    End of Session   Activity Tolerance: Other (comment) (limited by edema/pain) Patient left: in chair Nurse Communication: Mobility status PT Visit Diagnosis: Unsteadiness on feet (R26.81);Other abnormalities of gait and mobility (R26.89);Muscle weakness (generalized) (M62.81);Difficulty in walking, not elsewhere classified (R26.2)     Time: 1250-1309 PT Time Calculation (min) (ACUTE ONLY): 19 min  Charges:  $Therapeutic Activity: 8-22 mins                     Matthew Savage, PT Acute Rehab Services Pager [phone] [facility] [phone]    Karlton Lemon 10/14/2020,  1:54 PM

## 2020-10-14 NOTE — NC FL2 (Signed)
[place] LEVEL OF CARE SCREENING TOOL     IDENTIFICATION  Patient Name: Matthew Savage Birthdate: 09/25/1960 Sex: male Admission Date (Current Location): 09/29/2020  [facility] and [place] Number:  Herbalist and Address:  [facility] [place], [place] [address], [place], [place] [address]      Provider Number: [id number]  Attending Physician Name and Address:  Wonda Olds, MD  Relative Name and Phone Number:  [place], Dayal [facility])   [phone]    Current Level of Care: Hospital Recommended Level of Care: [place] Prior Approval Number:    Date Approved/Denied:   PASRR Number: Pending  Discharge Plan: SNF    Current Diagnoses: Patient Active Problem List   Diagnosis Date Noted   Coronary artery disease 10/03/2020   NSTEMI (non-ST elevated myocardial infarction) [facility])    Acute coronary syndrome ([place]) 09/30/2020   Class 2 obesity 09/30/2020   HFrEF (heart failure with reduced ejection fraction) ([place]) 02/29/2020   Nonrheumatic aortic valve stenosis 02/29/2020   Thoracic aortic aneurysm without rupture ([place]) 02/29/2020   Centrilobular emphysema ([place]) 01/23/2020   Mild aortic stenosis by prior echocardiogram 12/28/2019   Typical atrial flutter ([place]) 12/28/2019   Blood thinned due to long-term anticoagulant use 03/26/2016   Chronic deep vein thrombosis (DVT) of distal vein of left lower extremity ([place]) 03/26/2016   Gastric banding status 03/26/2016   Swelling of right testicle 01/14/2016   Chronic acne/blackheads of trunk, thighs, head, and neck 11/30/2011   Cervical radicular pain 04/06/2011   Gout 03/28/2008   Morbid obesity ([place]) 03/28/2008   OSA (obstructive sleep apnea) 08/23/2007   Hyperlipidemia 11/18/2006   Essential hypertension 11/18/2006   Aortic valve disorder 11/18/2006   ALLERGIC  RHINITIS 11/18/2006   DEGENERATIVE DISC DISEASE 11/17/2006    Orientation RESPIRATION BLADDER Height & Weight         Normal Continent, External catheter Weight: 298 lb 4.5 oz (135.3 kg) Height:  6\' 3"  (190.5 cm)  BEHAVIORAL SYMPTOMS/MOOD NEUROLOGICAL BOWEL NUTRITION STATUS      Continent Diet (See DC Summary)  AMBULATORY STATUS COMMUNICATION OF NEEDS Skin   Limited Assist Verbally Normal                       Personal Care Assistance Level of Assistance  Bathing, Feeding, Dressing Bathing Assistance: Limited assistance Feeding assistance: Independent Dressing Assistance: Limited assistance     Functional Limitations Info  Sight, Hearing, Speech Sight Info: Impaired Hearing Info: Adequate Speech Info: Adequate    SPECIAL CARE FACTORS FREQUENCY  PT (By licensed PT), OT (By licensed OT)     PT Frequency: 5x a week OT Frequency: 5x a week            Contractures Contractures Info: Not present    Additional Factors Info  Code Status, Allergies Code Status Info: Full Allergies Info: Levaquin           Current Medications (10/14/2020):  This is the current hospital active medication list Current Facility-Administered Medications  Medication Dose Route Frequency Provider Last Rate Last Admin   0.9 %  sodium chloride infusion  250 mL Intravenous Continuous Lars Pinks M, PA-C       0.9 %  sodium chloride infusion   Intravenous Continuous Lars Pinks M, PA-C 20 mL/hr at 10/03/20 1507 New Bag/Given (Non-Interop) at 10/03/20 1507   allopurinol (ZYLOPRIM) tablet 300 mg  300 mg Oral Daily Nani Skillern, PA-C  300 mg at 10/14/20 0944   amiodarone (PACERONE) tablet 200 mg  200 mg Oral BID Barrett, Erin R, PA-C   200 mg at 10/14/20 4010   aspirin EC tablet 325 mg  325 mg Oral Daily Lars Pinks M, PA-C   325 mg at 10/14/20 0945   atorvastatin (LIPITOR) tablet 80 mg  80 mg Oral Daily Lars Pinks M, PA-C   80 mg at 10/14/20 0945   colchicine tablet 0.3 mg  0.3 mg Oral BID Wonda Olds, MD   0.3 mg at 10/14/20 0946   dextrose 50 % solution 0-50  mL  0-50 mL Intravenous PRN Lars Pinks M, PA-C       feeding supplement (ENSURE ENLIVE / ENSURE PLUS) liquid 237 mL  237 mL Oral BID BM Wonda Olds, MD   237 mL at 10/14/20 0953   ferrous [id number] C-folic acid (TRINSICON / FOLTRIN) capsule 1 capsule  1 capsule Oral BID PC Gold, Wayne E, PA-C   1 capsule at 10/14/20 0944   furosemide (LASIX) injection 40 mg  40 mg Intravenous BID Lars Pinks M, PA-C   40 mg at 10/14/20 0947   ipratropium-albuterol (DUONEB) 0.5-2.5 (3) MG/3ML nebulizer solution 3 mL  3 mL Nebulization Q6H PRN Wonda Olds, MD       isosorbide dinitrate (ISORDIL) tablet 10 mg  10 mg Oral TID Wonda Olds, MD   10 mg at 10/14/20 0947   magnesium oxide (MAG-OX) tablet 400 mg  400 mg Oral Daily Lars Pinks M, PA-C   400 mg at 10/14/20 0945   metoprolol tartrate (LOPRESSOR) injection 2.5-5 mg  2.5-5 mg Intravenous Q2H PRN Lars Pinks M, PA-C       metoprolol tartrate (LOPRESSOR) tablet 25 mg  25 mg Oral BID Evans Lance, MD   25 mg at 10/14/20 0946   ondansetron (ZOFRAN) injection 4 mg  4 mg Intravenous Q6H PRN Nani Skillern, PA-C   4 mg at 10/06/20 6387   oxyCODONE (Oxy IR/ROXICODONE) immediate release tablet 5-10 mg  5-10 mg Oral Q3H PRN Nani Skillern, PA-C   10 mg at 10/11/20 2134   pantoprazole (PROTONIX) EC tablet 40 mg  40 mg Oral Daily Lars Pinks M, PA-C   40 mg at 10/14/20 0946   potassium chloride (KLOR-CON) CR tablet 30 mEq  30 mEq Oral BID Lars Pinks M, PA-C   30 mEq at 10/14/20 0945   rivaroxaban (XARELTO) tablet 20 mg  20 mg Oral Q supper Wonda Olds, MD   20 mg at 10/13/20 1609   simethicone (MYLICON) chewable tablet 80 mg  80 mg Oral QID PRN Doyne Keel., MD   80 mg at 10/14/20 1029   sodium chloride flush (NS) 0.9 % injection 10-40 mL  10-40 mL Intracatheter PRN Wonda Olds, MD   10 mL at 10/03/20 2119   sodium chloride flush (NS) 0.9 % injection 3 mL  3 mL  Intravenous PRN Lars Pinks M, PA-C       traMADol Veatrice Bourbon) tablet 50-100 mg  50-100 mg Oral Q4H PRN Lars Pinks M, PA-C   100 mg at 10/07/20 0014     Discharge Medications: Please see discharge summary for a list of discharge medications.  Relevant Imaging Results:  Relevant Lab Results:   Additional Information PFIZER Comrnaty([place]) Covid-19 Vaccine 07/18/2020; [place] COVID-19 Vaccine 01/17/2020 , 12/22/2019. SSN: [id number]  Reece Agar, LCSWA

## 2020-10-14 NOTE — Progress Notes (Signed)
Pt stated he was able to place himself on home cpap unit when ready for bed.

## 2020-10-14 NOTE — TOC Initial Note (Signed)
Transition of Care [facility]) - Initial/Assessment Note    Patient Details  Name: Matthew Savage MRN: [id number] Date of Birth: [birth year]  Transition of Care [facility]) CM/SW Contact:    Tresa Endo Phone Number: 10/14/2020, 3:49 PM  Clinical Narrative:                 [id number]: CSW spoke with pt in room, pt explained he wanted CIR. CSW provided SNF resource list from medicare.gov as a back up for rehab just in case CIR is not able to accept pt. CSW will reach out to CIR team.        Patient Goals and CMS Choice        Expected Discharge Plan and Services                                                Prior Living Arrangements/Services                       Activities of Daily Living Home Assistive Devices/Equipment: None ADL Screening (condition at time of admission) Patient's cognitive ability adequate to safely complete daily activities?: Yes Is the patient deaf or have difficulty hearing?: No Does the patient have difficulty seeing, even when wearing glasses/contacts?: No Does the patient have difficulty concentrating, remembering, or making decisions?: No Patient able to express need for assistance with ADLs?: Yes Does the patient have difficulty dressing or bathing?: No Independently performs ADLs?: Yes (appropriate for developmental age) Does the patient have difficulty walking or climbing stairs?: No Weakness of Legs: None Weakness of Arms/Hands: None  Permission Sought/Granted                  Emotional Assessment              Admission diagnosis:  Other chest pain [R07.89] Acute coronary syndrome (HCC) [I24.9] NSTEMI (non-ST elevated myocardial infarction) ([place]) [I21.4] Coronary artery disease [I25.10] Patient Active Problem List   Diagnosis Date Noted   Coronary artery disease 10/03/2020   NSTEMI (non-ST elevated myocardial infarction) ([place])    Acute coronary syndrome ([place]) 09/30/2020   Class 2 obesity 09/30/2020    HFrEF (heart failure with reduced ejection fraction) ([place]) 02/29/2020   Nonrheumatic aortic valve stenosis 02/29/2020   Thoracic aortic aneurysm without rupture ([place]) 02/29/2020   Centrilobular emphysema ([place]) 01/23/2020   Mild aortic stenosis by prior echocardiogram 12/28/2019   Typical atrial flutter ([place]) 12/28/2019   Blood thinned due to long-term anticoagulant use 03/26/2016   Chronic deep vein thrombosis (DVT) of distal vein of left lower extremity ([place]) 03/26/2016   Gastric banding status 03/26/2016   Swelling of right testicle 01/14/2016   Chronic acne/blackheads of trunk, thighs, head, and neck 11/30/2011   Cervical radicular pain 04/06/2011   Gout 03/28/2008   Morbid obesity ([place]) 03/28/2008   OSA (obstructive sleep apnea) 08/23/2007   Hyperlipidemia 11/18/2006   Essential hypertension 11/18/2006   Aortic valve disorder 11/18/2006   ALLERGIC  RHINITIS 11/18/2006   DEGENERATIVE [place] DISEASE 11/17/2006   PCP:  Dorothyann Peng, NP Pharmacy:   [id number] [place], [place] - [address] [place] [address] Phone: [phone] Fax: [phone]  [id number] - [place], [place] [address] [place]  [place] [address] Phone: [phone] Fax: [phone]     Social Determinants of Health (SDOH) Interventions    Readmission Risk Interventions No flowsheet data found.

## 2020-10-14 NOTE — Progress Notes (Addendum)
Ambulatory to the bath room, unable to wipe his rectum after Bm although with good range of motion  on both arms. Claimed that he was told not to move arms due to surgery.

## 2020-10-14 NOTE — Plan of Care (Signed)
  Problem: Education: Goal: Knowledge of General Education information will improve Description: Including pain rating scale, medication(s)/side effects and non-pharmacologic comfort measures Outcome: Progressing   Problem: Health Behavior/Discharge Planning: Goal: Ability to manage health-related needs will improve Outcome: Progressing   Problem: Clinical Measurements: Goal: Ability to maintain clinical measurements within normal limits will improve Outcome: Progressing   Problem: Clinical Measurements: Goal: Will remain free from infection Outcome: Progressing   Problem: Clinical Measurements: Goal: Diagnostic test results will improve Outcome: Progressing   Problem: Activity: Goal: Risk for activity intolerance will decrease Outcome: Progressing   Problem: Nutrition: Goal: Adequate nutrition will be maintained Outcome: Progressing   Problem: Elimination: Goal: Will not experience complications related to bowel motility Outcome: Progressing   Problem: Pain Managment: Goal: General experience of comfort will improve Outcome: Progressing   Problem: Skin Integrity: Goal: Risk for impaired skin integrity will decrease Outcome: Progressing   Problem: Education: Goal: Ability to demonstrate management of disease process will improve Outcome: Progressing   Problem: Education: Goal: Ability to verbalize understanding of medication therapies will improve Outcome: Progressing   Problem: Activity: Goal: Capacity to carry out activities will improve Outcome: Progressing   Problem: Cardiac: Goal: Ability to achieve and maintain adequate cardiopulmonary perfusion will improve Outcome: Progressing

## 2020-10-14 NOTE — Progress Notes (Addendum)
      [address]       [place] [address]             [phone]        11 Days Post-Op Procedure(s) (LRB): CORONARY ARTERY BYPASS GRAFTING (CABG) X FOUR, USING BILATERAL MAMMARY ARTERIES AND RIGHT ENDOSCOPIC GREATER SAPHENOUS VEIN CONDUITS (N/A) TRANSESOPHAGEAL ECHOCARDIOGRAM (TEE) (N/A) INDOCYANINE GREEN FLUORESCENCE IMAGING (ICG) (N/A) APPLICATION OF CELL SAVER (N/A) CLIPPING OF ATRIAL APPENDAGE WITH ATRICURE 40 CLIP (Left) ATRIAL SEPTAL DEFECT (ASD) REPAIR WITH PERI GUARD PERICARDIUM (N/A)  Subjective: Patient states feet are still swollen, and typically right is more swollen than left but more so of late and right foot hurts to walk because of swelling.  Objective: Vital signs in last 24 hours: Temp:  [97.7 F (36.5 C)-98.7 F (37.1 C)] 97.7 F (36.5 C) (06/27 0401) Pulse Rate:  [80-92] 88 (06/27 0401) Cardiac Rhythm: Atrial flutter;Bundle branch block (06/26 1900) Resp:  [16-18] 16 (06/27 0401) BP: (101-121)/(53-80) 103/53 (06/27 0401) SpO2:  [96 %-98 %] 96 % (06/27 0401) Weight:  [135.3 kg] 135.3 kg (06/27 0508)  Pre op weight 131.6 kg Current Weight  10/14/20 135.3 kg      Intake/Output from previous day: 06/26 0701 - 06/27 0700 In: 1080 [P.O.:1080] Out: 2900 [Urine:2900]   Physical Exam:  Cardiovascular: IRRR, murmur heard best along sternal border Pulmonary: Slightly diminished bibasilar breath sounds Abdomen: Soft, non tender, bowel sounds present. Extremities: +++ bilateral lower extremity/feet edema. Wounds: Sternal and right LE woundsa are clean and dry.  No erythema or signs of infection.  Lab Results: CBC: Recent Labs    10/14/20 0021  WBC 16.4*  HGB 8.8*  HCT 26.6*  PLT 415*   BMET:  Recent Labs    10/13/20 0059 10/14/20 0021  NA 135 131*  K 3.7 4.1  CL 100 94*  CO2 27 28  GLUCOSE 104* 104*  BUN 46* 42*  CREATININE 1.52* 1.57*  CALCIUM 8.2* 8.4*    PT/INR:  Lab Results  Component Value Date   INR 1.3  (H) 10/03/2020   INR 1.0 10/03/2020   ABG:  INR: Will add last result for INR, ABG once components are confirmed Will add last 4 CBG results once components are confirmed  Assessment/Plan:  1. CV - A flutter;history of . Had LA clip placed at the time of surgery. On Amiodarone 200 mg bid, Isordil 10 mg tid, Lopressor 25 mg bid, and Rivaroxaban 20 mg at supper 2.  Pulmonary - On room air. History of OSA-on CPAP at night. Encourage incentive spirometer 3. Volume Overload - On Lasix 40 mg IV bid and was given Metolazone yesterday as well. Will continue with current Lasix regimen and monitor creatinine. 4.  Expected post op acute blood loss anemia - H and H this am slightly decreased to 8.8 and 26.6. Continue Trinsicon 5. Magnesium low at 1.5;supplement 6. AKI- Creatinine this am slightly increased from 1.52 to 1.57. Creatinine upon admission 1.22 Re check in am 7. Hyponatremia-has had post op. Sodium 131. Possibly related to diuresis 8. WBC increased from 7,300 to 16,400. No fever, no wound infection. Will check UA, CXR 9. Patient developing loose stools so will stop softeners 10. When ready for discharge, will need SNF  Verbon Giangregorio M ZimmermanPA-C 10/14/2020,7:26 AM

## 2020-10-15 ENCOUNTER — Encounter (HOSPITAL_COMMUNITY): Payer: BC Managed Care – PPO

## 2020-10-15 DIAGNOSIS — I5043 Acute on chronic combined systolic (congestive) and diastolic (congestive) heart failure: Secondary | ICD-10-CM

## 2020-10-15 LAB — COMPREHENSIVE METABOLIC PANEL
ALT: 26 U/L (ref 0–44)
AST: 28 U/L (ref 15–41)
Albumin: 3 g/dL — ABNORMAL LOW (ref 3.5–5.0)
Alkaline Phosphatase: 87 U/L (ref 38–126)
Anion gap: 15 (ref 5–15)
BUN: 37 mg/dL — ABNORMAL HIGH (ref 6–20)
CO2: 26 mmol/L (ref 22–32)
Calcium: 8.7 mg/dL — ABNORMAL LOW (ref 8.9–10.3)
Chloride: 89 mmol/L — ABNORMAL LOW (ref 98–111)
Creatinine, Ser: 1.65 mg/dL — ABNORMAL HIGH (ref 0.61–1.24)
GFR, Estimated: 47 mL/min — ABNORMAL LOW (ref >60)
Glucose, Bld: 123 mg/dL — ABNORMAL HIGH (ref 70–99)
Potassium: 4.4 mmol/L (ref 3.5–5.1)
Sodium: 130 mmol/L — ABNORMAL LOW (ref 135–145)
Total Bilirubin: 0.4 mg/dL (ref 0.3–1.2)
Total Protein: 6.2 g/dL — ABNORMAL LOW (ref 6.5–8.1)

## 2020-10-15 LAB — CBC
HCT: 28.5 % — ABNORMAL LOW (ref 39.0–52.0)
Hemoglobin: 9.2 g/dL — ABNORMAL LOW (ref 13.0–17.0)
MCH: 31.5 pg (ref 26.0–34.0)
MCHC: 32.3 g/dL (ref 30.0–36.0)
MCV: 97.6 fL (ref 80.0–100.0)
Platelets: 451 10*3/uL — ABNORMAL HIGH (ref 150–400)
RBC: 2.92 MIL/uL — ABNORMAL LOW (ref 4.22–5.81)
RDW: 15 % (ref 11.5–15.5)
WBC: 14 10*3/uL — ABNORMAL HIGH (ref 4.0–10.5)
nRBC: 0 % (ref 0.0–0.2)

## 2020-10-15 LAB — BASIC METABOLIC PANEL
Anion gap: 10 (ref 5–15)
BUN: 38 mg/dL — ABNORMAL HIGH (ref 6–20)
CO2: 29 mmol/L (ref 22–32)
Calcium: 8.7 mg/dL — ABNORMAL LOW (ref 8.9–10.3)
Chloride: 92 mmol/L — ABNORMAL LOW (ref 98–111)
Creatinine, Ser: 1.68 mg/dL — ABNORMAL HIGH (ref 0.61–1.24)
GFR, Estimated: 46 mL/min — ABNORMAL LOW (ref >60)
Glucose, Bld: 120 mg/dL — ABNORMAL HIGH (ref 70–99)
Potassium: 4 mmol/L (ref 3.5–5.1)
Sodium: 131 mmol/L — ABNORMAL LOW (ref 135–145)

## 2020-10-15 LAB — MAGNESIUM: Magnesium: 1.7 mg/dL (ref 1.7–2.4)

## 2020-10-15 LAB — BRAIN NATRIURETIC PEPTIDE: B Natriuretic Peptide: 226.8 pg/mL — ABNORMAL HIGH (ref 0.0–100.0)

## 2020-10-15 MED ORDER — ALBUMIN HUMAN 25 % IV SOLN
12.5000 g | Freq: Once | INTRAVENOUS | Status: AC
  Administered 2020-10-15: 12.5 g via INTRAVENOUS
  Filled 2020-10-15: qty 50

## 2020-10-15 MED ORDER — FUROSEMIDE 10 MG/ML IJ SOLN
80.0000 mg | Freq: Two times a day (BID) | INTRAMUSCULAR | Status: DC
  Administered 2020-10-15: 80 mg via INTRAVENOUS
  Filled 2020-10-15: qty 8

## 2020-10-15 MED ORDER — POTASSIUM CHLORIDE CRYS ER 20 MEQ PO TBCR
40.0000 meq | EXTENDED_RELEASE_TABLET | Freq: Once | ORAL | Status: AC
  Administered 2020-10-15: 40 meq via ORAL
  Filled 2020-10-15: qty 2

## 2020-10-15 MED ORDER — ASPIRIN EC 81 MG PO TBEC
81.0000 mg | DELAYED_RELEASE_TABLET | Freq: Every day | ORAL | Status: DC
  Administered 2020-10-16 – 2020-11-05 (×20): 81 mg via ORAL
  Filled 2020-10-15 (×20): qty 1

## 2020-10-15 MED ORDER — TORSEMIDE 20 MG PO TABS
40.0000 mg | ORAL_TABLET | Freq: Every day | ORAL | Status: DC
  Administered 2020-10-16 – 2020-10-17 (×2): 40 mg via ORAL
  Filled 2020-10-15 (×3): qty 2

## 2020-10-15 NOTE — Progress Notes (Signed)
Nutrition Follow-up  DOCUMENTATION CODES:   Obesity unspecified  INTERVENTION:   - Continue Ensure Enlive po BID, each supplement provides 350 kcal and 20 grams of protein  - Encourage PO intake  NUTRITION DIAGNOSIS:   Inadequate oral intake related to acute illness as evidenced by per patient/family report.  Progressing, being addressed via oral nutrition supplements  GOAL:   Patient will meet greater than or equal to 90% of their needs  Progressing  MONITOR:   PO intake, Supplement acceptance, Labs, Weight trends  REASON FOR ASSESSMENT:   Consult Assessment of nutrition requirement/status  ASSESSMENT:   60 yo male admitted with severe 3-V CAD requiring CABG. PMH includes HLD, gout, HTN, OSA, CAD, hx of gastric lap band.  6/16 - TEE, CABG x 4  Per notes, pt's post-op ileus has resolved.  Spoke with pt at bedside. Pt reports appetite is improving day by day. He reports eating well at lunch today; noted pt had completed 50% of burger. Pt reports that he is consuming 2-3 Ensure supplements daily and tolerating them well. Will continue with current supplement regimen.  Noted Advanced HF Team has been consulted for acute on chronic systolic and diastolic CHF.  Admit weight: 134.7 kg Current weight: 135 kg  Meal Completion: 50-90% x last 8 meals  Medications reviewed and include: Ensure Enlive BID, trinsicon/foltrin, magnesium oxide, protonix, IV albumin 12.5 grams once  Labs reviewed: sodium 131, BUN 38, creatinine 1.68, hemoglobin 9.2  I/O's: -1.3 L since admit  Diet Order:   Diet Order             Diet Heart Room service appropriate? Yes; Fluid consistency: Thin  Diet effective now                   EDUCATION NEEDS:   Not appropriate for education at this time  Skin:  Skin Assessment: Skin Integrity Issues: Incisions: chest, R leg  Last BM:  10/14/20 type 6  Height:   Ht Readings from Last 1 Encounters:  10/03/20 6\' 3"  (1.905 m)     Weight:   Wt Readings from Last 1 Encounters:  10/15/20 135 kg    BMI:  Body mass index is 37.2 kg/m.  Estimated Nutritional Needs:   Kcal:  2200-2400 kcals  Protein:  120-135 grams  Fluid:  >/= 2 L    Gustavus Bryant, MS, RD, LDN Inpatient Clinical Dietitian Please see AMiON for contact information.

## 2020-10-15 NOTE — Progress Notes (Signed)
Inpatient Rehabilitation Admissions Coordinator   I have notified SW, Symone, via secure chat that patient is not a candidate for CIR admit. Walking independently .  Danne Baxter, RN, MSN Rehab Admissions Coordinator [phone] 10/15/2020 9:44 AM

## 2020-10-15 NOTE — Progress Notes (Addendum)
      [address]       [place] [address]             [phone]        12 Days Post-Op Procedure(s) (LRB): CORONARY ARTERY BYPASS GRAFTING (CABG) X FOUR, USING BILATERAL MAMMARY ARTERIES AND RIGHT ENDOSCOPIC GREATER SAPHENOUS VEIN CONDUITS (N/A) TRANSESOPHAGEAL ECHOCARDIOGRAM (TEE) (N/A) INDOCYANINE GREEN FLUORESCENCE IMAGING (ICG) (N/A) APPLICATION OF CELL SAVER (N/A) CLIPPING OF ATRIAL APPENDAGE WITH ATRICURE 40 CLIP (Left) ATRIAL SEPTAL DEFECT (ASD) REPAIR WITH PERI GUARD PERICARDIUM (N/A)  Subjective: Patient states both legs and feet are less swollen. He has already walked two times this morning.  Objective: Vital signs in last 24 hours: Temp:  [97.6 F (36.4 C)-98.2 F (36.8 C)] 98.2 F (36.8 C) (06/28 0756) Pulse Rate:  [71-99] 72 (06/28 0756) Cardiac Rhythm: Atrial flutter (06/28 0710) Resp:  [17-19] 19 (06/28 0530) BP: (95-128)/(62-88) 95/64 (06/28 0756) SpO2:  [94 %-99 %] 97 % (06/28 0756) Weight:  [135 kg] 135 kg (06/28 0530)  Pre op weight 131.6 kg Current Weight  10/15/20 135 kg      Intake/Output from previous day: 06/27 0701 - 06/28 0700 In: 717 [P.O.:717] Out: 300 [Urine:300]   Physical Exam:  Cardiovascular: IRRR, murmur heard best along sternal border Pulmonary: Slightly diminished bibasilar breath sounds Abdomen: Soft, non tender, bowel sounds present. Extremities: ++ bilateral lower extremity/feet edema;venous stasis changes Wounds: Sternal and right LE wounds are clean and dry.  No erythema or signs of infection.  Lab Results: CBC: Recent Labs    10/14/20 0021 10/15/20 0116  WBC 16.4* 14.0*  HGB 8.8* 9.2*  HCT 26.6* 28.5*  PLT 415* 451*   BMET:  Recent Labs    10/14/20 0021 10/15/20 0116  NA 131* 131*  K 4.1 4.0  CL 94* 92*  CO2 28 29  GLUCOSE 104* 120*  BUN 42* 38*  CREATININE 1.57* 1.68*  CALCIUM 8.4* 8.7*    PT/INR:  Lab Results  Component Value Date   INR 1.3 (H) 10/03/2020   INR 1.0  10/03/2020   ABG:  INR: Will add last result for INR, ABG once components are confirmed Will add last 4 CBG results once components are confirmed  Assessment/Plan:  1. CV - A flutter;history of a fib/fl. Had LA clip placed at the time of surgery. On Amiodarone 200 mg bid, Isordil 10 mg tid, Lopressor 25 mg bid, and Rivaroxaban 20 mg at supper 2.  Pulmonary - On room air. History of OSA-on CPAP at night. Encourage incentive spirometer 3. Acute on chronic heart failure- On Torsemide 40 mg daily. Appreciate advanced heart failure's assistance. Ok to place [employer]. 4.  Expected post op acute blood loss anemia - H and H this am slightly decreased to 7.9 and 24.1 (?) as HCT previous day was 28.5. Continue Trinsicon 5. Magnesium low at 1.4; supplement 6. AKI- Creatinine this am slightly decreased from 1.65 to 1.63. Creatinine upon admission 1.22 .Re check in am 7. Hyponatremia-has had post op. Sodium 131. Possibly related to diuresis 8. WBC decreased from 14,000 to 11,400. ? Gout. No fever, no wound infection, UA negative for UTI, bibasilar atelectasis on CXR. Uric acid elevated at 9.3 and has been on both Allopurinol and Colchicine. Duplex venous US ordered a few days ago but still not done;however, he did have a scan on 06/25 that was NEGATIVE 9. When ready for discharge, will need SNF  Tj Kitchings M ZimmermanPA-C 10/15/2020,8:31 AM

## 2020-10-15 NOTE — Progress Notes (Signed)
Ace wrap applied to BLE
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
## 2020-10-15 NOTE — Progress Notes (Signed)
Pt continues to c/o foot pain and refusing ambulation. Sts until his pain is controlled he will not ambulate.  [id number] [place], ACSM 11:23 AM 10/15/2020

## 2020-10-16 ENCOUNTER — Other Ambulatory Visit: Payer: Self-pay | Admitting: Cardiothoracic Surgery

## 2020-10-16 ENCOUNTER — Inpatient Hospital Stay (HOSPITAL_COMMUNITY): Payer: BC Managed Care – PPO

## 2020-10-16 DIAGNOSIS — M79669 Pain in unspecified lower leg: Secondary | ICD-10-CM | POA: Diagnosis not present

## 2020-10-16 DIAGNOSIS — M7989 Other specified soft tissue disorders: Secondary | ICD-10-CM

## 2020-10-16 DIAGNOSIS — Z951 Presence of aortocoronary bypass graft: Secondary | ICD-10-CM

## 2020-10-16 LAB — BASIC METABOLIC PANEL
Anion gap: 10 (ref 5–15)
BUN: 38 mg/dL — ABNORMAL HIGH (ref 6–20)
CO2: 28 mmol/L (ref 22–32)
Calcium: 8.2 mg/dL — ABNORMAL LOW (ref 8.9–10.3)
Chloride: 93 mmol/L — ABNORMAL LOW (ref 98–111)
Creatinine, Ser: 1.63 mg/dL — ABNORMAL HIGH (ref 0.61–1.24)
GFR, Estimated: 48 mL/min — ABNORMAL LOW (ref >60)
Glucose, Bld: 110 mg/dL — ABNORMAL HIGH (ref 70–99)
Potassium: 4.4 mmol/L (ref 3.5–5.1)
Sodium: 131 mmol/L — ABNORMAL LOW (ref 135–145)

## 2020-10-16 LAB — CBC
HCT: 24.1 % — ABNORMAL LOW (ref 39.0–52.0)
Hemoglobin: 7.9 g/dL — ABNORMAL LOW (ref 13.0–17.0)
MCH: 31.7 pg (ref 26.0–34.0)
MCHC: 32.8 g/dL (ref 30.0–36.0)
MCV: 96.8 fL (ref 80.0–100.0)
Platelets: 386 10*3/uL (ref 150–400)
RBC: 2.49 MIL/uL — ABNORMAL LOW (ref 4.22–5.81)
RDW: 15 % (ref 11.5–15.5)
WBC: 11.4 10*3/uL — ABNORMAL HIGH (ref 4.0–10.5)
nRBC: 0 % (ref 0.0–0.2)

## 2020-10-16 LAB — MAGNESIUM: Magnesium: 1.4 mg/dL — ABNORMAL LOW (ref 1.7–2.4)

## 2020-10-16 MED ORDER — MAGNESIUM OXIDE -MG SUPPLEMENT 400 (240 MG) MG PO TABS
800.0000 mg | ORAL_TABLET | Freq: Once | ORAL | Status: AC
  Administered 2020-10-16: 800 mg via ORAL
  Filled 2020-10-16: qty 2

## 2020-10-16 MED ORDER — POTASSIUM CHLORIDE CRYS ER 20 MEQ PO TBCR
20.0000 meq | EXTENDED_RELEASE_TABLET | Freq: Every day | ORAL | Status: DC
  Administered 2020-10-16 – 2020-11-05 (×20): 20 meq via ORAL
  Filled 2020-10-16 (×20): qty 1

## 2020-10-16 NOTE — Plan of Care (Signed)

## 2020-10-16 NOTE — Progress Notes (Signed)
Pt placed self on home cpap unit.

## 2020-10-16 NOTE — Progress Notes (Signed)
Patient states he will self administer his home CPAP without any assistance.

## 2020-10-16 NOTE — Progress Notes (Signed)
Physical Therapy Treatment and discharge  Patient Details Name: Matthew Savage MRN: [id number] DOB: [birth year] Today's Date: 10/16/2020    History of Present Illness Pt is a 60 y/o male presenting on 6/16 for CABG with TEE, and ICG with diagnosis of CAD. Pt extubated 6/16. Also, on 10/13/20 noted in MD notes pt with volume overload/chronic diastolic HF with lasix initiated to diurese. PMH includes: hematuria, class II obesity, DDD, chronic DVT, gout, heart murmur, hyperlipidemia, hypertension, lap band surgery, numbness and tingling, SCC of face, mild AVS, TAA, atrial fibrillation/typical atrial flutter with ablation, CAD multiple MIs.    PT Comments    Pt making excellent progress with goals met.  He has been ambulating independently in hallway today.  Bil LE still with + edema but decreased gradually and pt reports decreased pain.  He was able to demonstrate safe gait and stairs with PT.  Pt verbalzing and demonstrating understanding of sternal precautions. Initial plan was for SNF due to pt living alone, however, with improved mobility and reports his sister is coming to stay with him until he can travel to [place] to be with brother - updating recommendation to home with supervision.  Pt has no further acute PT needs.     Follow Up Recommendations  Supervision/Assistance - 24 hour (cardiac rehab)     Equipment Recommendations  Rolling walker with 5" wheels;3in1 (PT)    Recommendations for Other Services       Precautions / Restrictions Precautions Precautions: Sternal Precaution Booklet Issued: Yes (comment)    Mobility  Bed Mobility   Bed Mobility: Supine to Sit;Sit to Supine     Supine to sit: Supervision Sit to supine: Supervision   General bed mobility comments: HOB slightly elevated    Transfers Overall transfer level: Independent   Transfers: Sit to/from Stand Sit to Stand: Independent         General transfer comment: Had supervision during therapy but doing  independently all day.  Pt with fair compliance with sternal precautions - placed hands on chair beside but did not allow hands to go behind body. Educated on sternal precautions  Ambulation/Gait Ambulation/Gait assistance: Modified independent (Device/Increase time) Gait Distance (Feet): 500 Feet Assistive device: Rolling walker (2 wheeled);None Gait Pattern/deviations: Step-through pattern Gait velocity: WNL   General Gait Details: Short distance in room without RW but using RW in hall for safety.  Pt with little reliance on RW and demonstrated steady gait.  He had supervision with therapy but has walked in hallway 5 x on his own Investment banker, corporate confirmed)   Stairs Stairs: Yes Stairs assistance: Min guard Stair Management: Step to pattern;One rail Right Number of Stairs: 5 General stair comments: Performed with min guard for safety   Wheelchair Mobility    Modified Rankin (Stroke Patients Only)       Balance Overall balance assessment: Independent   Sitting balance-Leahy Scale: Normal     Standing balance support: No upper extremity supported Standing balance-Leahy Scale: Good Standing balance comment: Can ambulate without RW but takes for safety                            Cognition Arousal/Alertness: Awake/alert Behavior During Therapy: WFL for tasks assessed/performed Overall Cognitive Status: Within Functional Limits for tasks assessed  Exercises      General Comments General comments (skin integrity, edema, etc.): Discussed his good progress and plan for d/c.  Pt reports he wants to go home.  PT agrees pt is moving very well  -concern was that he lived alone.  He reports his sister is coming to stay with him to assist with IADLs and provide supervision.  Educated on sternal precautions with ADLs and transfers - referred to diagrams on handout.  Pt verbalizing understanding.  The one thing he is worried about  is cleaning after BM -has difficulty here in hospital but feels largely due to small bathroom and IV in hand.  Did discuss sternal precautions and should not be reaching behind body to clean.  Showed pt adaptive toilet wand on [employer] to assist with cleaning after BM. Encouraged pt to continue to mobilize.  Discussed no further need for acute PT - pt agrees.  Reports already has outpt appt for cardiac rehab.      Pertinent Vitals/Pain Pain Assessment: No/denies pain    Home Living                      Prior Function            PT Goals (current goals can now be found in the care plan section) Acute Rehab PT Goals PT Goal Formulation: All assessment and education complete, DC therapy Progress towards PT goals: Goals met/education completed, patient discharged from PT    Frequency           PT Plan Discharge plan needs to be updated    Co-evaluation              AM-PAC PT "6 Clicks" Mobility   Outcome Measure  Help needed turning from your back to your side while in a flat bed without using bedrails?: None Help needed moving from lying on your back to sitting on the side of a flat bed without using bedrails?: None Help needed moving to and from a bed to a chair (including a wheelchair)?: None Help needed standing up from a chair using your arms (e.g., wheelchair or bedside chair)?: None Help needed to walk in hospital room?: None Help needed climbing 3-5 steps with a railing? : A Little 6 Click Score: 23    End of Session   Activity Tolerance: Patient tolerated treatment well Patient left: in bed;with call bell/phone within reach Nurse Communication: Mobility status       Time: [id number] PT Time Calculation (min) (ACUTE ONLY): 21 min  Charges:  $Gait Training: 8-22 mins                     Abran Richard, PT Acute Rehab Services Pager [phone] [facility] 10/16/2020, 3:27 PM

## 2020-10-16 NOTE — Progress Notes (Signed)
CARDIAC REHAB PHASE I   PRE:  Rate/Rhythm: 72 SR    BP: sitting 106/65    SaO2: 94 RA  MODE:  Ambulation: 460 ft   POST:  Rate/Rhythm: 84 SR with PVCs    BP: sitting 120/70     SaO2: 97 RA  Donned socks for pt. Pt stood independently from EOB and walked independently with RW. No c/o. Sts this is his 5th walk today for the same distance. To bed after walk, VSS. Elevated feet in bed. Pt happy to be progressing and now would like to d/c home. Sts his sister can come from [place] to stay with him. This is reasonable. [place], ACSM 10/16/2020 1:34 PM

## 2020-10-16 NOTE — Progress Notes (Signed)
Bilateral lower extremity venous duplex has been completed. Preliminary results can be found in CV Proc through chart review.  Results were given to the patient's nurse, Janett Billow.  10/16/20 12:11 PM Carlos Levering RVT

## 2020-10-16 NOTE — TOC Progression Note (Addendum)
Transition of Care [facility]) - Progression Note    Patient Details  Name: Matthew Savage MRN: [id number] Date of Birth: 1961/04/18  Transition of Care [facility]) CM/SW Contact  Reece Agar, [place] Phone Number: 10/16/2020, 4:40 PM  Clinical Narrative:    44: CSW spoke with pt about SNF placement decision. Pt stated that he would prefer to go home with [facility] and he could get a family member to stay with him 24/7 if he knows in advance when he will DC. He states that if he cannot DC home, [facility] only facility pt wants.  CSW will follow up on DC plan tomorrow after speaking with MD.        Expected Discharge Plan and Services                                                 Social Determinants of Health (SDOH) Interventions    Readmission Risk Interventions No flowsheet data found.

## 2020-10-16 NOTE — Progress Notes (Addendum)
Advanced Heart Failure Rounding Note  PCP-Cardiologist: Werner Lean, MD   Subjective:    Some improvement in lower extremity edema overnight with compression. He was able to get shoes on this morning and walked several laps around the unit. No significant dyspnea.   Objective:   Weight Range: 133.7 kg Body mass index is 36.84 kg/m.   Vital Signs:   Temp:  [97.1 F (36.2 C)-98 F (36.7 C)] 98 F (36.7 C) (06/29 0821) Pulse Rate:  [72-103] 79 (06/29 0821) Resp:  [18-20] 18 (06/29 0821) BP: (100-118)/(60-71) 105/70 (06/29 0821) SpO2:  [94 %-98 %] 98 % (06/29 0409) Weight:  [133.7 kg] 133.7 kg (06/29 0409) Last BM Date: 10/15/20  Weight change: Filed Weights   10/14/20 0508 10/15/20 0530 10/16/20 0409  Weight: 135.3 kg 135 kg 133.7 kg    Intake/Output:   Intake/Output Summary (Last 24 hours) at 10/16/2020 0827 Last data filed at 10/16/2020 0817 Gross per 24 hour  Intake 997.8 ml  Output 2225 ml  Net -1227.2 ml      Physical Exam    General:  Well appearing. No resp difficulty HEENT: Normal Neck: Supple. No JVD . Carotids 2+ bilat; no bruits. No lymphadenopathy or thyromegaly appreciated. Cor: PMI nondisplaced. Sternum is stable. Rhythm is irregular. Rate 70s. 3/6 systolic murmur present. Lungs: Scattered expiratory wheezes, otherwise clear Abdomen: Soft, nontender, nondistended. No hepatosplenomegaly.  Extremities: No cyanosis, clubbing, 2+ b/l lower extremity edema. Improved from yesterday. Stasis changes noted. Prominent varicose veins posterior calf left lower extremity Neuro: Alert & orientedx3, cranial nerves grossly intact. moves all 4 extremities w/o difficulty. Affect pleasant   Telemetry   Atrial flutter with controlled rate. Intermittently having PVCs in trigeminy pattern  Labs    CBC Recent Labs    10/15/20 0116 10/16/20 0034  WBC 14.0* 11.4*  HGB 9.2* 7.9*  HCT 28.5* 24.1*  MCV 97.6 96.8  PLT 451* 419   Basic Metabolic  Panel Recent Labs    10/15/20 0116 10/15/20 1129 10/16/20 0034  NA 131* 130* 131*  K 4.0 4.4 4.4  CL 92* 89* 93*  CO2 29 26 28   GLUCOSE 120* 123* 110*  BUN 38* 37* 38*  CREATININE 1.68* 1.65* 1.63*  CALCIUM 8.7* 8.7* 8.2*  MG 1.7  --  1.4*   Liver Function Tests Recent Labs    10/15/20 1129  AST 28  ALT 26  ALKPHOS 87  BILITOT 0.4  PROT 6.2*  ALBUMIN 3.0*   No results for input(s): LIPASE, AMYLASE in the last 72 hours. Cardiac Enzymes No results for input(s): CKTOTAL, CKMB, CKMBINDEX, TROPONINI in the last 72 hours.  BNP: BNP (last 3 results) Recent Labs    10/15/20 1129  BNP 226.8*    ProBNP (last 3 results) No results for input(s): PROBNP in the last 8760 hours.   D-Dimer No results for input(s): DDIMER in the last 72 hours. Hemoglobin A1C No results for input(s): HGBA1C in the last 72 hours. Fasting Lipid Panel No results for input(s): CHOL, HDL, LDLCALC, TRIG, CHOLHDL, LDLDIRECT in the last 72 hours. Thyroid Function Tests No results for input(s): TSH, T4TOTAL, T3FREE, THYROIDAB in the last 72 hours.  Invalid input(s): FREET3  Other results:   Imaging    No results found.   Medications:     Scheduled Medications:  allopurinol  300 mg Oral Daily   amiodarone  200 mg Oral BID   aspirin EC  81 mg Oral Daily   atorvastatin  80 mg  Oral Daily   colchicine  0.3 mg Oral BID   feeding supplement  237 mL Oral BID BM   ferrous [id number] C-folic acid  1 capsule Oral BID PC   isosorbide dinitrate  10 mg Oral TID   magnesium oxide  800 mg Oral Once   metoprolol tartrate  25 mg Oral BID   pantoprazole  40 mg Oral Daily   rivaroxaban  20 mg Oral Q supper   torsemide  40 mg Oral Daily    Infusions:  sodium chloride     sodium chloride 20 mL/hr at 10/03/20 1507    PRN Medications: dextrose, ipratropium-albuterol, metoprolol tartrate, ondansetron (ZOFRAN) IV, oxyCODONE, simethicone, sodium chloride flush, sodium chloride flush,  traMADol    Assessment/Plan  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 EF improved to 50-55% after atrial flutter ablation. Echo 06/15 with EF 50-55.  EF 45-50% with multiple wall motion abnormalities on limited echo 06/20. -Significant bilateral lower extremity edema noted over the last week. Was receiving po lasix followed by 40 mg lasix IV BID. 2.5 mg metolazone on 06/25 and 5 mg on 06/26. No significant change in edema with diuretics.  -Leg edema likely multifactorial, probably primarily due to venous stasis with some element of CHF.  Known history of chronic left LE DVT. Palpable varicose veins on exam.  -ACE wraps ordered yesterday. Received Coban wraps. Had some improvement in edema but difficulty tolerating the wraps. Requested ACE wraps today. Can also consider Unna boots. Discussed importance of leg elevation and compression.  -Switched to torsemide 40 mg po daily yesterday. Continue current dose.  -Weight on admit 134 kg, had trended up to 142.6 kg. 135 kg yesterday and 133.7 kg today. -Creatinine stable at 1.63. Monitor renal function and electrolytes. On K supplement.  -Home entresto held d/t low blood pressure and AKI. Consider restarting at discharge if able. Also consider switching metoprolol tartrate to metoprolol succinate prior to discharge. Atrial fibrillation/flutter: -Known history - s/p atrial flutter ablation November 2021. -S/p clipping of left atrial appendage with Pro-V clip at time of CABG. -Recurrent atrial flutter noted several days ago. Has been on amiodarone post-op.  Metoprolol tartrate was added and increased to 25 mg BID on 06/26. Rate controlled.  -Anticoagulated with Xarelto. Continue to monitor hemoglobin with anticoagulation. Coronary artery disease: -Admitted with NSTEMI on 09/29/2020. -S/p CABG X 4 and endarterectomy of OM2 and PDA. -CT surgery managing. 4. Aortic valve  stenosis: -Moderate in severity with mean gradient of 26 mmHg on echo this admission. 5. Hx left lower extremity DVT: -Anticoagulated with Xarelto. -Venous duplex 06/24 with no evidence of DVT RLE, chronic DVT left popliteal vein. 6. ASD -S/p repair at time of CABG. 7. AKI  -creatinine up to 2.99 post -op, creatinine stable around 1.6 last few days.  -Baseline 1.1 - 1.2. 8. Hyponatremia -Sodium as low as 125 on 06/20, 131 last few days. -Diuretics may be contributing. 9. Post-op acute blood loss anemia -Hemoglobin 7.9 today. Down from 9.2 yesterday.  -CT surgery managing. 10. OSA -On CPAP. 11. Leukocytosis -WBC down from 16.4 to 11.4 today. -CTS treating for possible gout. Uric acid level 9.3.  -No fevers. -No new infiltrate on chest x-ray. 12. Acute gout flare On colchicine and allopurinol.  Length of Stay: Slater, Hugo, PA-C  10/16/2020, 8:27 AM  Advanced Heart Failure Team Pager [phone] (M-F; 7a - 5p)  Please contact [facility] for night-coverage  after hours (5p -7a ) and weekends on [email or web address]  Patient seen and examined with the above-signed Advanced Practice Provider and/or Housestaff. I personally reviewed laboratory data, imaging studies and relevant notes. I independently examined the patient and formulated the important aspects of the plan. I have edited the note to reflect any of my changes or salient points. I have personally discussed the plan with the patient and/or family.  Denies CP or SOB. LE edema coming down. Says he couldn't tolerate coban wraps due to cramping. Weight down another 3 pounds    General:  obese male  sitting in chair No resp difficulty HEENT: normal Neck: supple. no JVD. Carotids 2+ bilat; no bruits. No lymphadenopathy or thryomegaly appreciated. Cor: PMI nondisplaced. Regular rate & rhythm. No rubs, gallops or murmurs. Lungs: clear Abdomen: obese soft, nontender, nondistended. No hepatosplenomegaly. No bruits or masses. Good  bowel sounds. Extremities: no cyanosis, clubbing, rash, 2+ edema Neuro: alert & orientedx3, cranial nerves grossly intact. moves all 4 extremities w/o difficulty. Affect pleasant  I think most of edema in venous stasis. Continue torsemide 40 daily. Will wrap LEs with ACE wraps. Continue to ambulate. Possibly home tomorrow. Consider spiro  Glori Bickers, MD  3:01 PM

## 2020-10-17 ENCOUNTER — Inpatient Hospital Stay (HOSPITAL_COMMUNITY): Payer: BC Managed Care – PPO

## 2020-10-17 LAB — CBC
HCT: 27.6 % — ABNORMAL LOW (ref 39.0–52.0)
Hemoglobin: 9 g/dL — ABNORMAL LOW (ref 13.0–17.0)
MCH: 31.6 pg (ref 26.0–34.0)
MCHC: 32.6 g/dL (ref 30.0–36.0)
MCV: 96.8 fL (ref 80.0–100.0)
Platelets: 436 10*3/uL — ABNORMAL HIGH (ref 150–400)
RBC: 2.85 MIL/uL — ABNORMAL LOW (ref 4.22–5.81)
RDW: 14.8 % (ref 11.5–15.5)
WBC: 15 10*3/uL — ABNORMAL HIGH (ref 4.0–10.5)
nRBC: 0 % (ref 0.0–0.2)

## 2020-10-17 LAB — BASIC METABOLIC PANEL
Anion gap: 9 (ref 5–15)
BUN: 33 mg/dL — ABNORMAL HIGH (ref 6–20)
CO2: 31 mmol/L (ref 22–32)
Calcium: 8.6 mg/dL — ABNORMAL LOW (ref 8.9–10.3)
Chloride: 89 mmol/L — ABNORMAL LOW (ref 98–111)
Creatinine, Ser: 1.71 mg/dL — ABNORMAL HIGH (ref 0.61–1.24)
GFR, Estimated: 45 mL/min — ABNORMAL LOW (ref >60)
Glucose, Bld: 117 mg/dL — ABNORMAL HIGH (ref 70–99)
Potassium: 4.3 mmol/L (ref 3.5–5.1)
Sodium: 129 mmol/L — ABNORMAL LOW (ref 135–145)

## 2020-10-17 LAB — SEDIMENTATION RATE: Sed Rate: 30 mm/hr — ABNORMAL HIGH (ref 0–16)

## 2020-10-17 LAB — PROCALCITONIN: Procalcitonin: 2.88 ng/mL

## 2020-10-17 LAB — LACTIC ACID, PLASMA: Lactic Acid, Venous: 1.4 mmol/L (ref 0.5–1.9)

## 2020-10-17 LAB — C-REACTIVE PROTEIN: CRP: 1.3 mg/dL — ABNORMAL HIGH (ref <1.0)

## 2020-10-17 MED ORDER — SODIUM CHLORIDE 0.9 % IV SOLN
2.0000 g | Freq: Three times a day (TID) | INTRAVENOUS | Status: DC
  Administered 2020-10-17 – 2020-10-18 (×2): 2 g via INTRAVENOUS
  Filled 2020-10-17 (×2): qty 2

## 2020-10-17 MED ORDER — AMIODARONE HCL IN DEXTROSE 360-4.14 MG/200ML-% IV SOLN
30.0000 mg/h | INTRAVENOUS | Status: DC
  Administered 2020-10-17 – 2020-10-19 (×4): 30 mg/h via INTRAVENOUS
  Filled 2020-10-17 (×4): qty 200

## 2020-10-17 MED ORDER — ACETAMINOPHEN 325 MG PO TABS
650.0000 mg | ORAL_TABLET | Freq: Four times a day (QID) | ORAL | Status: DC | PRN
  Administered 2020-10-17: 650 mg via ORAL
  Filled 2020-10-17: qty 2

## 2020-10-17 MED ORDER — VANCOMYCIN HCL 1500 MG/300ML IV SOLN: 1500.0000 mg | INTRAVENOUS | Status: DC

## 2020-10-17 MED ORDER — ALBUMIN HUMAN 5 % IV SOLN
12.5000 g | Freq: Once | INTRAVENOUS | Status: AC
  Administered 2020-10-18: 12.5 g via INTRAVENOUS
  Filled 2020-10-17: qty 250

## 2020-10-17 MED ORDER — AMIODARONE HCL IN DEXTROSE 360-4.14 MG/200ML-% IV SOLN
60.0000 mg/h | INTRAVENOUS | Status: AC
  Administered 2020-10-17 (×2): 60 mg/h via INTRAVENOUS
  Filled 2020-10-17: qty 200

## 2020-10-17 MED ORDER — VANCOMYCIN HCL 1500 MG/300ML IV SOLN: 1500.0000 mg | Freq: Once | INTRAVENOUS | Status: DC

## 2020-10-17 MED ORDER — AMIODARONE LOAD VIA INFUSION
150.0000 mg | Freq: Once | INTRAVENOUS | Status: AC
  Administered 2020-10-17: 150 mg via INTRAVENOUS
  Filled 2020-10-17: qty 83.34

## 2020-10-17 MED ORDER — VANCOMYCIN HCL 2000 MG/400ML IV SOLN
2000.0000 mg | Freq: Once | INTRAVENOUS | Status: AC
  Administered 2020-10-17: 2000 mg via INTRAVENOUS
  Filled 2020-10-17: qty 400

## 2020-10-17 NOTE — Progress Notes (Signed)
Pt declined ambulation. Sts he feels "run over" after his event last night and that that is normal for him to feel that way after a bout of afib. Sts he might walk in afternoon. [place], ACSM 11:34 AM 10/17/2020

## 2020-10-17 NOTE — Progress Notes (Signed)
Pharmacy Antibiotic Note  Matthew Savage is a 60 y.o. male admitted on 09/29/2020 with  wound infection .  Pharmacy has been consulted for vancomycin and cefepime dosing.  WBC increased to 15, fever reported by RN today, sed rate 30, CRP 1.3, LA 1.4, PCT 2.88. CT chest showing retrosternal fluid in superior mediastinum (abscess cannot be excluded but could be post-op seroma). Bcx sent. Scr 1.71 (CrCl 67 mL/min).   Plan: Vancomycin 2 IV once then 1500 mg IV every 24 hours (estAUC 457) Cefepime 2g IV every 8 hours Monitor Scr, clinical pic, cx results, and vanc levels as appropriate  Height: 6\' 3"  (190.5 cm) Weight: 131.3 kg (289 lb 6.4 oz) IBW/kg (Calculated) : 84.5  Temp (24hrs), Avg:98.2 F (36.8 C), Min:97.6 F (36.4 C), Max:99.4 F (37.4 C)  Recent Labs  Lab 10/11/20 0051 10/13/20 0059 10/14/20 0021 10/15/20 0116 10/15/20 1129 10/16/20 0034 10/17/20 0014 10/17/20 0851  WBC 7.3  --  16.4* 14.0*  --  11.4* 15.0*  --   CREATININE 1.73*   < > 1.57* 1.68* 1.65* 1.63* 1.71*  --   LATICACIDVEN  --   --   --   --   --   --   --  1.4   < > = values in this interval not displayed.    Estimated Creatinine Clearance: 67.1 mL/min (A) (by C-G formula based on SCr of 1.71 mg/dL (H)).    Allergies  Allergen Reactions   Levaquin [Levofloxacin]     Aortic Aneurysm    Antimicrobials this admission: Vancomycin 6/30 >>  Cefepime 6/30 >>   Dose adjustments this admission: N/A  Microbiology results: 6/30 BCx: sent 6/13 COVID PCR: neg  Thank you for allowing pharmacy to be a part of this patient's care.  Antonietta Jewel, PharmD, [facility] Clinical Pharmacist  Phone: [phone] 10/17/2020 3:40 PM  Please check AMION for all [facility] phone numbers After 10:00 PM, call [facility] [phone]

## 2020-10-17 NOTE — Progress Notes (Signed)
OT Cancellation Note  Patient Details Name: Matthew Savage MRN: [id number] DOB: [birth year]   Cancelled Treatment:    Reason Eval/Treat Not Completed: Medical issues which prohibited therapy- RN reports pt in Afib and requesting OT to hold at this time.  OT will follow and see as able.   Jolaine Artist, OT Acute Rehabilitation Services Pager [phone] Office [phone]   [name] 10/17/2020, 3:09 PM

## 2020-10-17 NOTE — Progress Notes (Addendum)
[address]       [place] [address]             [phone]        13 Days Post-Op Procedure(s) (LRB): CORONARY ARTERY BYPASS GRAFTING (CABG) X FOUR, USING BILATERAL MAMMARY ARTERIES AND RIGHT ENDOSCOPIC GREATER SAPHENOUS VEIN CONDUITS (N/A) TRANSESOPHAGEAL ECHOCARDIOGRAM (TEE) (N/A) INDOCYANINE GREEN FLUORESCENCE IMAGING (ICG) (N/A) APPLICATION OF CELL SAVER (N/A) CLIPPING OF ATRIAL APPENDAGE WITH ATRICURE 40 CLIP (Left) ATRIAL SEPTAL DEFECT (ASD) REPAIR WITH PERI GUARD PERICARDIUM (N/A)  Subjective: Patient lying in bed. He states "what is causing that fever"?  Objective: Vital signs in last 24 hours: Temp:  [97.6 F (36.4 C)-99.4 F (37.4 C)] 99.4 F (37.4 C) (06/30 0144) Pulse Rate:  [71-79] 73 (06/29 2111) Cardiac Rhythm: Junctional rhythm (06/30 0520) Resp:  [18-19] 19 (06/29 2000) BP: (89-114)/(52-76) 89/52 (06/30 0404) Weight:  [131.3 kg] 131.3 kg (06/30 0128)  Pre op weight 131.6 kg Current Weight  10/17/20 131.3 kg      Intake/Output from previous day: 06/29 0701 - 06/30 0700 In: 1853 [P.O.:1853] Out: 5050 [Urine:5050]   Physical Exam:  Cardiovascular: IRRR, murmur heard best along sternal border Pulmonary: Slightly diminished bibasilar breath sounds Abdomen: Soft, non tender, bowel sounds present. Extremities: ++ bilateral lower extremity/feet edema;venous stasis changes (has decreased over last 1-2 days) Wounds: Sternal and right LE wounds are clean and dry.  No erythema or signs of infection RLE wound. Lower sternal wound has some erythema superficially but no drainage   Lab Results: CBC: Recent Labs    10/16/20 0034 10/17/20 0014  WBC 11.4* 15.0*  HGB 7.9* 9.0*  HCT 24.1* 27.6*  PLT 386 436*    BMET:  Recent Labs    10/16/20 0034 10/17/20 0014  NA 131* 129*  K 4.4 4.3  CL 93* 89*  CO2 28 31  GLUCOSE 110* 117*  BUN 38* 33*  CREATININE 1.63* 1.71*  CALCIUM 8.2* 8.6*     PT/INR:  Lab Results   Component Value Date   INR 1.3 (H) 10/03/2020   INR 1.0 10/03/2020   ABG:  INR: Will add last result for INR, ABG once components are confirmed Will add last 4 CBG results once components are confirmed  Assessment/Plan:  1. CV - A flutter;history of a fib/fl. Had LA clip placed at the time of surgery. Per nurse's note, HR above 200 around 1 am while he was on his way to bathroom. EKG showed accelerated junctional. He had episodes of a fib with RVR yesterday as well. On Amiodarone 200 mg bid, Isordil 10 mg tid, Lopressor 25 mg bid, and Rivaroxaban 20 mg at supper 2.  Pulmonary - On room air. History of OSA-on CPAP at night. Encourage incentive spirometer 3. Acute on chronic heart failure- On Torsemide 40 mg daily. Appreciate advanced heart failure's assistance. Ok to place [employer]. 4.  Expected post op acute blood loss anemia - H and H yesterday decreased to 7.9 and 24.1 (? error) as  this am is stable at 9 and 27.6. Continue Trinsicon 6. AKI- Creatinine this am slightly increased from 1.63 to 1.71. Creatinine upon admission 1.22 .Re check in am 7. Hyponatremia-has had post op. Sodium 129. Possibly related to diuresis 8. WBC increased from 11,400 to 15,000. Etiology to be determined. He also had a fever to 102.3 earlier this am;previously with increased WBC, no fever. Previous workup for leukocytosis showed UA negative for UTI, bibasilar atelectasis on CXR.  Uric acid elevated at 9.3 but has been on both Allopurinol and Colchicine. Duplex venous US ordered a few days ago but still not done;however, he did have a scan on 06/25 that was NEGATIVE. CRP 1.3 and sed rate 30. Await blood culture. As discussed with surgeon, will obtain CT scan of chest (without contrast). 9. When ready for discharge, will need SNF  Donielle M ZimmermanPA-C 10/17/2020,7:24 AM   Febrile overnight, and leukocytosis this am Drainage for from inferior portion of sternal incision CT ordered, which shows a retrosternal  fluid collection Will start empiric abx, and local wound care to the incision  Soliana Kitko O Bennye Nix

## 2020-10-17 NOTE — Progress Notes (Addendum)
Advanced Heart Failure Rounding Note  PCP-Cardiologist: Christell Constant, MD   Subjective:    -09/29/2020: admitted with NSTEMI -10/01/2020: cardiac cath demonstrated multivessel CAD -10/03/2020: CABG X 4, endarterectomy of OM1 and PDA, ASD repair, clipping of left atrial appendage -Post-op course complicated by AKI, acute blood loss anemia, ileus (resolved), and post-op atrial flutter -10/15/2020: Switched IV lasix to torsemide 40 mg po daily. Lower extremity edema predominately due to venous stasis.   -Echo, 10/02/2020: EF 50-55%, moderate concentric LVH, normal RV size and systolic function, moderate aortic valve stenosis with mean gradient of 29 mmHg -Limited echo, 10/07/2020: EF 45-50%, moderate hypokinesis/dyskinesis left ventricular, mid-apical anterior wall, anteroseptal wall and inferoapical segment, moderate aortic valve stenosis with mean gradient of 26 mmHg    RN reports temp up to102 F overnight. For several hours had runs of atrial fibrillation with RVR with rates as high as 170s up to 190 and intermittent reentrant tachycardia. Converted to sinus this am but back in atrial fibrillation this afternoon.   Felt okay today but more fatigued after runs of afib. Lower extremity edema improving. Not complaining of any sternal pain. No shortness of breath.     Objective:   Weight Range: 131.3 kg Body mass index is 36.17 kg/m.   Vital Signs:   Temp:  [97.6 F (36.4 C)-99.4 F (37.4 C)] 99.4 F (37.4 C) (06/30 0144) Pulse Rate:  [71-79] 73 (06/29 2111) Resp:  [18-19] 19 (06/29 2000) BP: (89-114)/(52-76) 89/52 (06/30 0404) Weight:  [131.3 kg] 131.3 kg (06/30 0128) Last BM Date: 10/16/20  Weight change: Filed Weights   10/15/20 0530 10/16/20 0409 10/17/20 0128  Weight: 135 kg 133.7 kg 131.3 kg    Intake/Output:   Intake/Output Summary (Last 24 hours) at 10/17/2020 0708 Last data filed at 10/17/2020 0129 Gross per 24 hour  Intake 1853 ml  Output 5050 ml   Net -3197 ml      Physical Exam    General:  No acute distress. Lying in bed. HEENT: Normal Neck: Supple. No JVD. Carotids 2+ bilat; no bruits. No lymphadenopathy or thyromegaly appreciated. Cor: PMI nondisplaced. Rhythm is regular. Rate 70s. 3/6 systolic murmur present. Erythema noted to lower third of sternal incision. No tenderness with direct palpation. Lungs: Scattered expiratory wheezes, diminished breath sounds in bases Abdomen: Soft, nontender, nondistended.  Extremities: No cyanosis, clubbing, 1-2+ b/l lower extremity edema. Improved from yesterday. Stasis changes noted. Prominent varicose veins posterior calf left lower extremity Neuro: Alert & orientedx3, cranial nerves grossly intact. moves all 4 extremities w/o difficulty.    Telemetry   Atrial fibrillation with RVR and reentrant tachycardia overnight. Converted to sinus but back in afib this afternoon.  Labs    CBC Recent Labs    10/16/20 0034 10/17/20 0014  WBC 11.4* 15.0*  HGB 7.9* 9.0*  HCT 24.1* 27.6*  MCV 96.8 96.8  PLT 386 436*   Basic Metabolic Panel Recent Labs    65/55/76 0116 10/15/20 1129 10/16/20 0034 10/17/20 0014  NA 131*   < > 131* 129*  K 4.0   < > 4.4 4.3  CL 92*   < > 93* 89*  CO2 29   < > 28 31  GLUCOSE 120*   < > 110* 117*  BUN 38*   < > 38* 33*  CREATININE 1.68*   < > 1.63* 1.71*  CALCIUM 8.7*   < > 8.2* 8.6*  MG 1.7  --  1.4*  --    < > =  values in this interval not displayed.   Liver Function Tests Recent Labs    10/15/20 1129  AST 28  ALT 26  ALKPHOS 87  BILITOT 0.4  PROT 6.2*  ALBUMIN 3.0*   No results for input(s): LIPASE, AMYLASE in the last 72 hours. Cardiac Enzymes No results for input(s): CKTOTAL, CKMB, CKMBINDEX, TROPONINI in the last 72 hours.  BNP: BNP (last 3 results) Recent Labs    10/15/20 1129  BNP 226.8*    ProBNP (last 3 results) No results for input(s): PROBNP in the last 8760 hours.   D-Dimer No results for input(s): DDIMER in the  last 72 hours. Hemoglobin A1C No results for input(s): HGBA1C in the last 72 hours. Fasting Lipid Panel No results for input(s): CHOL, HDL, LDLCALC, TRIG, CHOLHDL, LDLDIRECT in the last 72 hours. Thyroid Function Tests No results for input(s): TSH, T4TOTAL, T3FREE, THYROIDAB in the last 72 hours.  Invalid input(s): FREET3  Other results:   Imaging    VAS [place] LOWER EXTREMITY VENOUS (DVT)  Result Date: 10/16/2020  Lower Venous DVT Study Patient Name:  Matthew Savage  Date of Exam:   10/16/2020 Medical Rec #: [id number]       Accession #:    [id number] Date of Birth: 1960/05/03       Patient Gender: M Patient Age:   60Y Exam Location:  [facility] Procedure:      VAS [place] LOWER EXTREMITY VENOUS (DVT) Referring Phys: [id number] Sharalyn Ink ZIMMERMAN --------------------------------------------------------------------------------  Indications: Pain, and Swelling.  Limitations: Body habitus, poor ultrasound/tissue interface, bandages and open wound. Comparison Study: 10/11/2020 - RIGHT:                   - There is no evidence of deep vein thrombosis in the lower                   extremity.                   - No cystic structure found in the popliteal fossa.                    LEFT:                   - Findings consistent with chronic deep vein thrombosis                   involving the left popliteal vein.                   - No cystic structure found in the popliteal fossa. Performing Technologist: Oliver Hum RVT  Examination Guidelines: A complete evaluation includes B-mode imaging, spectral Doppler, color Doppler, and power Doppler as needed of all accessible portions of each vessel. Bilateral testing is considered an integral part of a complete examination. Limited examinations for reoccurring indications may be performed as noted. The reflux portion of the exam is performed with the patient in reverse Trendelenburg.  +---------+---------------+---------+-----------+----------+--------------+  RIGHT    CompressibilityPhasicitySpontaneityPropertiesThrombus Aging +---------+---------------+---------+-----------+----------+--------------+ CFV      Full           Yes      Yes                                 +---------+---------------+---------+-----------+----------+--------------+ SFJ      Full                                                        +---------+---------------+---------+-----------+----------+--------------+  FV Prox  Full                                                        +---------+---------------+---------+-----------+----------+--------------+ FV Mid   Full                                                        +---------+---------------+---------+-----------+----------+--------------+ FV DistalFull                                                        +---------+---------------+---------+-----------+----------+--------------+ PFV      Full                                                        +---------+---------------+---------+-----------+----------+--------------+ POP      Full           Yes      Yes                                 +---------+---------------+---------+-----------+----------+--------------+ PTV      Full                                                        +---------+---------------+---------+-----------+----------+--------------+ PERO     Full                                                        +---------+---------------+---------+-----------+----------+--------------+   +---------+---------------+---------+-----------+----------+--------------+ LEFT     CompressibilityPhasicitySpontaneityPropertiesThrombus Aging +---------+---------------+---------+-----------+----------+--------------+ CFV      Full           Yes      Yes                                 +---------+---------------+---------+-----------+----------+--------------+ SFJ      Full                                                         +---------+---------------+---------+-----------+----------+--------------+ FV Prox  Full                                                        +---------+---------------+---------+-----------+----------+--------------+  FV Mid   Full                                                        +---------+---------------+---------+-----------+----------+--------------+ FV Distal               Yes      Yes                                 +---------+---------------+---------+-----------+----------+--------------+ PFV      Full                                                        +---------+---------------+---------+-----------+----------+--------------+ POP      Partial        Yes      Yes                  Chronic        +---------+---------------+---------+-----------+----------+--------------+ PTV      Full                                                        +---------+---------------+---------+-----------+----------+--------------+ PERO     Full                                                        +---------+---------------+---------+-----------+----------+--------------+     Summary: RIGHT: - There is no evidence of deep vein thrombosis in the lower extremity.  - No cystic structure found in the popliteal fossa.  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 - Findings appear essentially unchanged compared to previous examination. - No cystic structure found in the popliteal fossa.  *See table(s) above for measurements and observations. Electronically signed by Curt Jews MD on 10/16/2020 at 2:56:45 PM.    Final      Medications:     Scheduled Medications:  allopurinol  300 mg Oral Daily   amiodarone  200 mg Oral BID   aspirin EC  81 mg Oral Daily   atorvastatin  80 mg Oral Daily   colchicine  0.3 mg Oral BID   feeding supplement  237 mL Oral BID BM   ferrous  [id number] C-folic acid  1 capsule Oral BID PC   isosorbide dinitrate  10 mg Oral TID   metoprolol tartrate  25 mg Oral BID   pantoprazole  40 mg Oral Daily   potassium chloride  20 mEq Oral Daily   rivaroxaban  20 mg Oral Q supper   torsemide  40 mg Oral Daily    Infusions:  sodium chloride     sodium chloride 20 mL/hr at 10/03/20 1507    PRN Medications: acetaminophen, dextrose, ipratropium-albuterol, metoprolol tartrate, ondansetron (ZOFRAN) IV, oxyCODONE, simethicone, sodium chloride flush,  sodium chloride flush, traMADol    Assessment/Plan  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 EF improved to 50-55% after atrial flutter ablation. Echo 06/15 with EF 50-55.  EF 45-50% with multiple wall motion abnormalities on limited echo 06/20. -Leg edema likely multifactorial, primarily due to venous stasis with some element of CHF.  Known history of chronic left LE DVT. No new DVT on [place] yesterday.  -ACE wraps ordered. Can also consider Unna boots. Leg elevation. -Edema improving. Had been receiving po furosemide and later IV furosemide 40 mg BID. Switched to torsemide 40 mg po daily 06/30. Will continue for now, remains slightly volume up. Diuresed > 5L yesterday. Weight down 5 lb overnight and 8 lb over last 2 days.  Creatinine up just slightly, 1.71 this am and 1.63 yesterday. K okay. Na 129. BUN trending down. -Home entresto held d/t low blood pressure and AKI. Plan to add spiro once blood pressures improve. -Consider switching metoprolol tartrate to metoprolol succinate at D/C. May need to hold BB if blood pressure remains low.  Atrial arrhythmias: -s/p atrial flutter ablation November 2021. -clipping of left atrial appendage with Pro-V clip at time of CABG. -Recurrent atrial flutter noted post-op. Has been on amiodarone.  Metoprolol tartrate added and increased to 25 mg BID on 06/26.  -Overnight had  several hours of atrial fibrillation with RVR and runs of reentrant tachycardia. Converted to sinus this am but back in afib this afternoon. Given amiodarone bolus and will load with IV amio. Stop po amio.  -? If infection may be contributing -Anticoagulated with Xarelto. Continue to monitor hemoglobin with anticoagulation.  Coronary artery disease: -NSTEMI on 09/29/2020. -S/p CABG X 4 and endarterectomy of OM2 and PDA. -CT surgery managing.  4. Aortic valve stenosis: -Moderate in severity with mean gradient of 26 mmHg on echo this admission.  5. Hx left lower extremity DVT: -Anticoagulated with Xarelto. -Venous duplex yesterday with no evidence of DVT RLE, chronic DVT left popliteal vein.  6. ASD -S/p repair at time of CABG.  7. AKI  -creatinine up to 2.99 post -op, creatinine 1.7 today (up just slightly from 1.6s last few days) -Baseline 1.1 - 1.2.  8. Hyponatremia -Sodium as low as 125 on 06/20, had been low 130s several days. 129 today.  9. Post-op acute blood loss anemia -Hemoglobin stable at 9.0 today -CT surgery managing.  10. OSA -On CPAP.  11. Leukocytosis/fever/? infection -WBC up to 16.4 on 06/27 - had been trending down. 15.0 today. -CTS had been treating for possible gout. Uric acid level 9.3. Previously no infiltrate on chest x-ray. UA negative.  -Afib with RVR and reentrant tachycardia overnight. Fever with temp of 102.3 F per RN. -Blood cultures pending. ESR 30, CRP 1.3.  -Erythema noted to lower third of sternal incision. Discussed with CT surgery. CT chest w/o contrast today.  -Procalcitonin and lactic acid ordered.   12. Acute gout flare On colchicine and allopurinol.  Length of Stay: [address], [place], PA-C  10/17/2020, 7:08 AM  Advanced Heart Failure Team Pager [phone] (M-F; 7a - 5p)  Please contact [facility] for night-coverage after hours (5p -7a ) and weekends on [email or web address]  Patient seen and examined with the above-signed Advanced  Practice Provider and/or Housestaff. I personally reviewed laboratory data, imaging studies and relevant notes. I independently examined the patient and formulated the important aspects of the plan. I have edited the note to reflect  any of my changes or salient points. I have personally discussed the plan with the patient and/or family.  He was in atrial tach/atypical flutter overnight with RVR. Broke this am. Now febrile with rising WBCs. Concern for lower sternal wound infection. Diuresed well on po torsemide. Weight down 5 pounds. BP soft. LE edema improved.   General:  Sitting in chair No resp difficulty HEENT: normal Neck: supple. no JVD. Carotids 2+ bilat; no bruits. No lymphadenopathy or thryomegaly appreciated. Cor: PMI nondisplaced. Mild erythema at lower end of sternal wound Regular rate & rhythm. No rubs, gallops or murmurs. Lungs: clear Abdomen: obese soft, nontender, nondistended. No hepatosplenomegaly. No bruits or masses. Good bowel sounds. Extremities: no cyanosis, clubbing, rash, 1+ edema Neuro: alert & orientedx3, cranial nerves grossly intact. moves all 4 extremities w/o difficulty. Affect pleasant  Will start IV amio for atrial tach/atypical flutter. If continues will need AC. Volume status much improved. Stop torsemide for now. Keep LEs wrapped and elevated. W/u of possible sternal infection per TCTS.  Glori Bickers, MD  3:46 PM

## 2020-10-17 NOTE — Progress Notes (Signed)
Notified by Matthew Savage with Cardiac Rehab of pt going into Afib upon ambulation to bedside commode. Pt denies SOB, dizziness, or feeling of flutters. Notified Matthew Clegg NP at 1422.

## 2020-10-17 NOTE — Progress Notes (Signed)
Pt ambulated to the bathroom and HR spiked up as high as 203. Pt asymptomatic and asked to take a seat since his HR was elevated. Once pt sat down HR decreased ranging from 110-120's. EKG completed, Pt VS checked, temperature 102.3. MD made aware, new orders placed. Will continue to monitor.

## 2020-10-17 NOTE — Progress Notes (Signed)
  Amiodarone Drug - Drug Interaction Consult Note  Recommendations: On atorvastatin and metoprolol along with Xarelto for anticoagulation. Qtc on last check 480.  No medication changes at this time - consider to monitor for future medication interactions and vitals.   Amiodarone is metabolized by the cytochrome P450 system and therefore has the potential to cause many drug interactions. Amiodarone has an average plasma half-life of 50 days (range 20 to 100 days).   There is potential for drug interactions to occur several weeks or months after stopping treatment and the onset of drug interactions may be slow after initiating amiodarone.   [x]  Statins: Increased risk of myopathy. Simvastatin- restrict dose to 20mg  daily. Other statins: counsel patients to report any muscle pain or weakness immediately.  []  Anticoagulants: Amiodarone can increase anticoagulant effect. Consider warfarin dose reduction. Patients should be monitored closely and the dose of anticoagulant altered accordingly, remembering that amiodarone levels take several weeks to stabilize.  []  Antiepileptics: Amiodarone can increase plasma concentration of phenytoin, the dose should be reduced. Note that small changes in phenytoin dose can result in large changes in levels. Monitor patient and counsel on signs of toxicity.  [x]  Beta blockers: increased risk of bradycardia, AV block and myocardial depression. Sotalol - avoid concomitant use.  []   Calcium channel blockers (diltiazem and verapamil): increased risk of bradycardia, AV block and myocardial depression.  []   Cyclosporine: Amiodarone increases levels of cyclosporine. Reduced dose of cyclosporine is recommended.  []  Digoxin dose should be halved when amiodarone is started.  []  Diuretics: increased risk of cardiotoxicity if hypokalemia occurs.  []  Oral hypoglycemic agents (glyburide, glipizide, glimepiride): increased risk of hypoglycemia. Patient's glucose levels  should be monitored closely when initiating amiodarone therapy.   []  Drugs that prolong the QT interval:  Torsades de pointes risk may be increased with concurrent use - avoid if possible.  Monitor QTc, also keep magnesium/potassium WNL if concurrent therapy can't be avoided.  Antibiotics: e.g. fluoroquinolones, erythromycin.  Antiarrhythmics: e.g. quinidine, procainamide, disopyramide, sotalol.  Antipsychotics: e.g. phenothiazines, haloperidol.   Lithium, tricyclic antidepressants, and methadone.  Thank You,  Antonietta Jewel, PharmD, [place] Clinical Pharmacist  Phone: [phone] 10/17/2020 12:28 PM  Please check AMION for all [place] phone numbers After 10:00 PM, call [place] [phone]

## 2020-10-17 NOTE — Progress Notes (Signed)
Patient has home CPAP setup within reach at bedside.  RT assistance not needed at this time.

## 2020-10-17 NOTE — TOC Progression Note (Addendum)
Transition of Care [facility]) - Progression Note    Patient Details  Name: Matthew Savage MRN: [id number] Date of Birth: [birth year]  [facility] CM/SW Contact  Reece Agar, [place] Phone Number: 10/17/2020, 2:34 PM  Clinical Narrative:    91: CSW spoke with pt about reconsidering SNF bc he states he went into AFIB and thinks it would be best if he went to SNF so that he can eventually be able to safely travel out of state to live with his brother. [facility].  1438: Pt insurance is not in network with [facility], pt will have to choose another facility that has already made an offer.        Expected Discharge Plan and Services                                                 Social Determinants of Health (SDOH) Interventions    Readmission Risk Interventions No flowsheet data found.

## 2020-10-17 NOTE — Progress Notes (Signed)
Pt continues to decline ambulation today. Asked to go to [facility]. Assisted pt to stand. In afib, rate in 80s. Asx. Notified RN. Encouraged ambulation later. [place], ACSM 2:13 PM 10/17/2020

## 2020-10-18 ENCOUNTER — Inpatient Hospital Stay (HOSPITAL_COMMUNITY): Payer: BC Managed Care – PPO | Admitting: Certified Registered Nurse Anesthetist

## 2020-10-18 ENCOUNTER — Encounter (HOSPITAL_COMMUNITY)
Admission: EM | Disposition: A | Discharge status: Home Health | Payer: Self-pay | Source: Home / Self Care | Attending: Cardiothoracic Surgery

## 2020-10-18 ENCOUNTER — Encounter (HOSPITAL_COMMUNITY): Payer: Self-pay | Admitting: Cardiothoracic Surgery

## 2020-10-18 DIAGNOSIS — A419 Sepsis, unspecified organism: Secondary | ICD-10-CM | POA: Diagnosis not present

## 2020-10-18 DIAGNOSIS — A4153 Sepsis due to Serratia: Secondary | ICD-10-CM | POA: Diagnosis not present

## 2020-10-18 DIAGNOSIS — I249 Acute ischemic heart disease, unspecified: Secondary | ICD-10-CM | POA: Diagnosis not present

## 2020-10-18 DIAGNOSIS — T8132XA Disruption of internal operation (surgical) wound, not elsewhere classified, initial encounter: Secondary | ICD-10-CM

## 2020-10-18 HISTORY — PX: APPLICATION OF WOUND VAC: SHX5189

## 2020-10-18 HISTORY — PX: STERNAL WOUND DEBRIDEMENT: SHX1058

## 2020-10-18 LAB — SURGICAL PCR SCREEN
MRSA, PCR: NEGATIVE
Staphylococcus aureus: NEGATIVE

## 2020-10-18 LAB — BASIC METABOLIC PANEL
Anion gap: 9 (ref 5–15)
BUN: 38 mg/dL — ABNORMAL HIGH (ref 6–20)
CO2: 30 mmol/L (ref 22–32)
Calcium: 8.4 mg/dL — ABNORMAL LOW (ref 8.9–10.3)
Chloride: 90 mmol/L — ABNORMAL LOW (ref 98–111)
Creatinine, Ser: 1.82 mg/dL — ABNORMAL HIGH (ref 0.61–1.24)
GFR, Estimated: 42 mL/min — ABNORMAL LOW (ref >60)
Glucose, Bld: 140 mg/dL — ABNORMAL HIGH (ref 70–99)
Potassium: 4.1 mmol/L (ref 3.5–5.1)
Sodium: 129 mmol/L — ABNORMAL LOW (ref 135–145)

## 2020-10-18 LAB — BLOOD CULTURE ID PANEL (REFLEXED) - BCID2

## 2020-10-18 LAB — BODY FLUID CELL COUNT WITH DIFFERENTIAL
Eos, Fluid: 3 %
Lymphs, Fluid: 80 %
Monocyte-Macrophage-Serous Fluid: 1 % — ABNORMAL LOW (ref 50–90)
Neutrophil Count, Fluid: 16 % (ref 0–25)
Total Nucleated Cell Count, Fluid: 735 cu mm (ref 0–1000)

## 2020-10-18 LAB — CBC
HCT: 24.2 % — ABNORMAL LOW (ref 39.0–52.0)
Hemoglobin: 7.8 g/dL — ABNORMAL LOW (ref 13.0–17.0)
MCH: 31.6 pg (ref 26.0–34.0)
MCHC: 32.2 g/dL (ref 30.0–36.0)
MCV: 98 fL (ref 80.0–100.0)
Platelets: 349 10*3/uL (ref 150–400)
RBC: 2.47 MIL/uL — ABNORMAL LOW (ref 4.22–5.81)
RDW: 15.4 % (ref 11.5–15.5)
WBC: 13.7 10*3/uL — ABNORMAL HIGH (ref 4.0–10.5)
nRBC: 0 % (ref 0.0–0.2)

## 2020-10-18 LAB — MAGNESIUM: Magnesium: 1.4 mg/dL — ABNORMAL LOW (ref 1.7–2.4)

## 2020-10-18 SURGERY — DEBRIDEMENT, WOUND, STERNUM
Anesthesia: General

## 2020-10-18 MED ORDER — SODIUM CHLORIDE 0.9 % IR SOLN
Status: DC | PRN
  Administered 2020-10-18: 2000 mL

## 2020-10-18 MED ORDER — MIDAZOLAM HCL 2 MG/2ML IJ SOLN
INTRAMUSCULAR | Status: AC
  Filled 2020-10-18: qty 2

## 2020-10-18 MED ORDER — FENTANYL CITRATE (PF) 250 MCG/5ML IJ SOLN
INTRAMUSCULAR | Status: AC
  Filled 2020-10-18: qty 5

## 2020-10-18 MED ORDER — SODIUM CHLORIDE 0.9 % IV SOLN
2.0000 g | Freq: Three times a day (TID) | INTRAVENOUS | Status: DC
  Administered 2020-10-18 – 2020-10-21 (×9): 2 g via INTRAVENOUS
  Filled 2020-10-18 (×9): qty 2

## 2020-10-18 MED ORDER — ORAL CARE MOUTH RINSE: 15.0000 mL | Freq: Once | OROMUCOSAL | Status: AC

## 2020-10-18 MED ORDER — FENTANYL CITRATE (PF) 250 MCG/5ML IJ SOLN
INTRAMUSCULAR | Status: DC | PRN
  Administered 2020-10-18: 100 ug via INTRAVENOUS
  Administered 2020-10-18: 150 ug via INTRAVENOUS

## 2020-10-18 MED ORDER — CHLORHEXIDINE GLUCONATE 0.12 % MT SOLN
15.0000 mL | Freq: Once | OROMUCOSAL | Status: AC
  Administered 2020-10-18: 15 mL via OROMUCOSAL
  Filled 2020-10-18: qty 15

## 2020-10-18 MED ORDER — PROPOFOL 10 MG/ML IV BOLUS
INTRAVENOUS | Status: AC
  Filled 2020-10-18: qty 20

## 2020-10-18 MED ORDER — MIDAZOLAM HCL 2 MG/2ML IJ SOLN
INTRAMUSCULAR | Status: DC | PRN
  Administered 2020-10-18: 2 mg via INTRAVENOUS

## 2020-10-18 MED ORDER — SUCCINYLCHOLINE CHLORIDE 20 MG/ML IJ SOLN
INTRAMUSCULAR | Status: DC | PRN
  Administered 2020-10-18: 140 mg via INTRAVENOUS

## 2020-10-18 MED ORDER — ROCURONIUM BROMIDE 100 MG/10ML IV SOLN
INTRAVENOUS | Status: DC | PRN
  Administered 2020-10-18: 50 mg via INTRAVENOUS

## 2020-10-18 MED ORDER — AMIODARONE HCL 200 MG PO TABS
400.0000 mg | ORAL_TABLET | Freq: Two times a day (BID) | ORAL | Status: DC
  Administered 2020-10-18 – 2020-10-23 (×10): 400 mg via ORAL
  Filled 2020-10-18 (×10): qty 2

## 2020-10-18 MED ORDER — PHENYLEPHRINE HCL-NACL 10-0.9 MG/250ML-% IV SOLN
INTRAVENOUS | Status: DC | PRN
  Administered 2020-10-18: 40 ug/min via INTRAVENOUS

## 2020-10-18 MED ORDER — SUGAMMADEX SODIUM 200 MG/2ML IV SOLN
INTRAVENOUS | Status: DC | PRN
  Administered 2020-10-18: 200 mg via INTRAVENOUS

## 2020-10-18 MED ORDER — SODIUM CHLORIDE 0.9 % IV SOLN
2.0000 g | INTRAVENOUS | Status: DC
  Filled 2020-10-18: qty 20

## 2020-10-18 MED ORDER — LACTATED RINGERS IV SOLN: INTRAVENOUS | Status: DC

## 2020-10-18 MED ORDER — MAGNESIUM OXIDE -MG SUPPLEMENT 400 (240 MG) MG PO TABS
400.0000 mg | ORAL_TABLET | Freq: Two times a day (BID) | ORAL | Status: AC
  Administered 2020-10-18: 400 mg via ORAL
  Filled 2020-10-18: qty 1

## 2020-10-18 MED ORDER — LIDOCAINE HCL (CARDIAC) PF 100 MG/5ML IV SOSY
PREFILLED_SYRINGE | INTRAVENOUS | Status: DC | PRN
  Administered 2020-10-18: 60 mg via INTRATRACHEAL

## 2020-10-18 MED ORDER — PROPOFOL 10 MG/ML IV BOLUS
INTRAVENOUS | Status: DC | PRN
  Administered 2020-10-18: 150 ug via INTRAVENOUS

## 2020-10-18 MED ORDER — ONDANSETRON HCL 4 MG/2ML IJ SOLN
INTRAMUSCULAR | Status: DC | PRN
  Administered 2020-10-18: 4 mg via INTRAVENOUS

## 2020-10-18 SURGICAL SUPPLY — 67 items
APL SKNCLS STERI-STRIP NONHPOA (GAUZE/BANDAGES/DRESSINGS)
ATTRACTOMAT 16X20 MAGNETIC DRP (DRAPES) ×2 IMPLANT
BAG DECANTER FOR FLEXI CONT (MISCELLANEOUS) ×2 IMPLANT
BENZOIN TINCTURE PRP APPL 2/3 (GAUZE/BANDAGES/DRESSINGS) IMPLANT
BLADE CLIPPER SURG (BLADE) ×3 IMPLANT
BLADE SURG 10 STRL SS (BLADE) ×2 IMPLANT
BNDG GAUZE ELAST 4 BULKY (GAUZE/BANDAGES/DRESSINGS) IMPLANT
CANISTER SUCT 3000ML PPV (MISCELLANEOUS) ×4 IMPLANT
CANISTER WOUND CARE 500ML ATS (WOUND CARE) ×3 IMPLANT
CANISTER WOUNDNEG PRESSURE 500 (CANNISTER) ×1 IMPLANT
CATH FOLEY 2WAY SLVR  5CC 16FR (CATHETERS)
CATH FOLEY 2WAY SLVR 5CC 16FR (CATHETERS) IMPLANT
CLIP VESOCCLUDE SM WIDE 24/CT (CLIP) IMPLANT
CNTNR URN SCR LID CUP LEK RST (MISCELLANEOUS) IMPLANT
COLLECTOR WOUND DRAINAGE SMALL (WOUND CARE) ×1 IMPLANT
CONT SPEC 4OZ STRL OR WHT (MISCELLANEOUS)
COVER SURGICAL LIGHT HANDLE (MISCELLANEOUS) ×5 IMPLANT
DRAIN CHANNEL 19F RND (DRAIN) ×1 IMPLANT
DRAIN CONNECTOR BLAKE 1:1 (MISCELLANEOUS) ×1 IMPLANT
DRAPE LAPAROSCOPIC ABDOMINAL (DRAPES) ×3 IMPLANT
DRAPE WARM FLUID 44X44 (DRAPES) IMPLANT
DRSG AQUACEL AG ADV 3.5X14 (GAUZE/BANDAGES/DRESSINGS) ×2 IMPLANT
DRSG VAC ATS LRG SENSATRAC (GAUZE/BANDAGES/DRESSINGS) IMPLANT
DRSG VAC ATS MED SENSATRAC (GAUZE/BANDAGES/DRESSINGS) IMPLANT
DRSG VAC ATS SM SENSATRAC (GAUZE/BANDAGES/DRESSINGS) ×1 IMPLANT
ELECT REM PT RETURN 9FT ADLT (ELECTROSURGICAL) ×6
ELECTRODE REM PT RTRN 9FT ADLT (ELECTROSURGICAL) ×2 IMPLANT
GAUZE SPONGE 4X4 12PLY STRL (GAUZE/BANDAGES/DRESSINGS) ×3 IMPLANT
GAUZE SPONGE 4X4 12PLY STRL LF (GAUZE/BANDAGES/DRESSINGS) ×1 IMPLANT
GAUZE XEROFORM 5X9 LF (GAUZE/BANDAGES/DRESSINGS) IMPLANT
GLOVE SURG ENC MOIS LTX SZ7 (GLOVE) ×3 IMPLANT
GLOVE SURG ENC MOIS LTX SZ7.5 (GLOVE) ×3 IMPLANT
GOWN STRL REUS W/ TWL LRG LVL3 (GOWN DISPOSABLE) ×4 IMPLANT
GOWN STRL REUS W/ TWL XL LVL3 (GOWN DISPOSABLE) ×2 IMPLANT
GOWN STRL REUS W/TWL LRG LVL3 (GOWN DISPOSABLE) ×6
GOWN STRL REUS W/TWL XL LVL3 (GOWN DISPOSABLE) ×3
HANDPIECE INTERPULSE COAX TIP (DISPOSABLE)
HEMOSTAT POWDER SURGIFOAM 1G (HEMOSTASIS) IMPLANT
HEMOSTAT SURGICEL 2X14 (HEMOSTASIS) IMPLANT
KIT BASIN OR (CUSTOM PROCEDURE TRAY) ×3 IMPLANT
KIT TURNOVER KIT B (KITS) ×3 IMPLANT
NS IRRIG 1000ML POUR BTL (IV SOLUTION) ×4 IMPLANT
PACK CHEST (CUSTOM PROCEDURE TRAY) ×1 IMPLANT
PACK GENERAL/GYN (CUSTOM PROCEDURE TRAY) ×3 IMPLANT
PAD ARMBOARD 7.5X6 YLW CONV (MISCELLANEOUS) ×6 IMPLANT
SET HNDPC FAN SPRY TIP SCT (DISPOSABLE) IMPLANT
SLEEVE SCD COMPRESS KNEE XLRG (STOCKING) ×1 IMPLANT
SOL PREP POV-IOD 4OZ 10% (MISCELLANEOUS) ×1 IMPLANT
SPONGE T-LAP 18X18 ~~LOC~~+RFID (SPONGE) ×4 IMPLANT
STAPLER VISISTAT 35W (STAPLE) IMPLANT
SUT SILK  1 MH (SUTURE) ×3
SUT SILK 1 MH (SUTURE) IMPLANT
SUT VIC AB 1 CTX 36 (SUTURE)
SUT VIC AB 1 CTX36XBRD ANBCTR (SUTURE) ×4 IMPLANT
SUT VIC AB 2-0 CT1 27 (SUTURE) ×3
SUT VIC AB 2-0 CT1 TAPERPNT 27 (SUTURE) IMPLANT
SUT VIC AB 2-0 CTX 27 (SUTURE) ×4 IMPLANT
SUT VIC AB 3-0 X1 27 (SUTURE) ×4 IMPLANT
SWAB COLLECTION DEVICE MRSA (MISCELLANEOUS) ×1 IMPLANT
SWAB CULTURE ESWAB REG 1ML (MISCELLANEOUS) ×1 IMPLANT
SYR 5ML LL (SYRINGE) IMPLANT
SYSTEM SAHARA CHEST DRAIN ATS (WOUND CARE) ×1 IMPLANT
TAPE CLOTH SURG 4X10 WHT LF (GAUZE/BANDAGES/DRESSINGS) ×1 IMPLANT
TOWEL GREEN STERILE (TOWEL DISPOSABLE) ×3 IMPLANT
TOWEL GREEN STERILE FF (TOWEL DISPOSABLE) ×3 IMPLANT
TRAP SPECIMEN MUCUS 40CC (MISCELLANEOUS) ×1 IMPLANT
WATER STERILE IRR 1000ML POUR (IV SOLUTION) ×3 IMPLANT

## 2020-10-18 NOTE — Anesthesia Postprocedure Evaluation (Signed)
Anesthesia Post Note  Patient: Matthew Savage  Procedure(s) Performed: STERNAL WOUND DEBRIDEMENT APPLICATION OF WOUND VAC     Patient location during evaluation: PACU Anesthesia Type: General Level of consciousness: awake Pain management: pain level controlled Vital Signs Assessment: post-procedure vital signs reviewed and stable Respiratory status: spontaneous breathing Cardiovascular status: stable Postop Assessment: no apparent nausea or vomiting Anesthetic complications: no   No notable events documented.  Last Vitals:  Vitals:   10/18/20 1504 10/18/20 1710  BP: 116/69   Pulse: 85   Resp: 20   Temp: 37 C 36.8 C  SpO2: 98%     Last Pain:  Vitals:   10/18/20 1710  TempSrc:   PainSc: 0-No pain                 France Lusty

## 2020-10-18 NOTE — Progress Notes (Signed)
Spoke to [place], Surveyor, mining, and she stated patient had breakfast at 0800. Dr. Kalman Shan, anesthesia MD, notified and stated patient cannot go to OR until 1600; OR desk and floor nurse made aware. Floor nurse to keep patient NPO; verbalized understanding.

## 2020-10-18 NOTE — TOC Progression Note (Addendum)
Transition of Care (TOC) - Progression Note  Heart Failure   Patient Details  Name: Matthew Savage MRN: [id number] Date of Birth: [birth year]  Transition of Care [facility]) CM/SW [place], [place] Phone Number: 10/18/2020, 9:21 AM  Clinical Narrative:    CSW called [facility] to ask about bed availability for rehab and spoke with Elyse Hsu who reported that they are completely full at the moment. CSW will try to outreach them again next week and ask [name] about other options for his disposition.        Expected Discharge Plan and Services                                                 Social Determinants of Health (SDOH) Interventions    Readmission Risk Interventions No flowsheet data found.  Keisa Blow, MSW, [place] Heart Failure Social Worker

## 2020-10-18 NOTE — Plan of Care (Signed)

## 2020-10-18 NOTE — Anesthesia Procedure Notes (Signed)
Procedure Name: Intubation Date/Time: 10/18/2020 4:20 PM Performed by: Josephine Igo, CRNA Pre-anesthesia Checklist: Patient identified, Emergency Drugs available and Suction available Patient Re-evaluated:Patient Re-evaluated prior to induction Oxygen Delivery Method: Circle system utilized Preoxygenation: Pre-oxygenation with 100% oxygen Induction Type: IV induction Ventilation: Oral airway inserted - appropriate to patient size and Mask ventilation without difficulty Laryngoscope Size: Miller and 2 Grade View: Grade I Tube type: Oral Tube size: 8.0 mm Number of attempts: 1 Airway Equipment and Method: Stylet Placement Confirmation: ETT inserted through vocal cords under direct vision, positive ETCO2 and breath sounds checked- equal and bilateral Secured at: 24 cm Tube secured with: Tape Dental Injury: Teeth and Oropharynx as per pre-operative assessment

## 2020-10-18 NOTE — Progress Notes (Addendum)
[address]       [place] [address]             [phone]        14 Days Post-Op Procedure(s) (LRB): CORONARY ARTERY BYPASS GRAFTING (CABG) X FOUR, USING BILATERAL MAMMARY ARTERIES AND RIGHT ENDOSCOPIC GREATER SAPHENOUS VEIN CONDUITS (N/A) TRANSESOPHAGEAL ECHOCARDIOGRAM (TEE) (N/A) INDOCYANINE GREEN FLUORESCENCE IMAGING (ICG) (N/A) APPLICATION OF CELL SAVER (N/A) CLIPPING OF ATRIAL APPENDAGE WITH ATRICURE 40 CLIP (Left) ATRIAL SEPTAL DEFECT (ASD) REPAIR WITH PERI GUARD PERICARDIUM (N/A)  Subjective: Patient using bedside toilet this am. He states legs much less swollen but feet still "big".  Objective: Vital signs in last 24 hours: Temp:  [97.8 F (36.6 C)-98.7 F (37.1 C)] 98.4 F (36.9 C) (07/01 0346) Pulse Rate:  [67-79] 77 (07/01 0346) Cardiac Rhythm: Heart block (06/30 2030) Resp:  [18-20] 19 (07/01 0346) BP: (83-118)/(44-68) 103/51 (07/01 0346) SpO2:  [93 %-98 %] 94 % (07/01 0346) Weight:  [130.8 kg] 130.8 kg (07/01 0335)  Pre op weight 131.6 kg Current Weight  10/18/20 130.8 kg      Intake/Output from previous day: 06/30 0701 - 07/01 0700 In: 2431.5 [P.O.:1320; I.V.:424.3; IV Piggyback:687.2] Out: 1350 [Urine:1350]   Physical Exam:  Cardiovascular: RRR, murmur heard best along sternal border Pulmonary: Slightly diminished bibasilar breath sounds Abdomen: Soft, non tender, bowel sounds present. Extremities: ++ bilateral feet edema;venous stasis changes (has decreased over last 1-2 days). Erythema back of right hand-likely from IV infiltration Wounds: Sternal and right LE wounds are clean and dry.  No erythema or signs of infection RLE wound. Lower sternal wound has some erythema superficially but no drainage this am  Lab Results: CBC: Recent Labs    10/17/20 0014 10/18/20 0047  WBC 15.0* 13.7*  HGB 9.0* 7.8*  HCT 27.6* 24.2*  PLT 436* 349    BMET:  Recent Labs    10/17/20 0014 10/18/20 0047  NA 129* 129*  K 4.3  4.1  CL 89* 90*  CO2 31 30  GLUCOSE 117* 140*  BUN 33* 38*  CREATININE 1.71* 1.82*  CALCIUM 8.6* 8.4*     PT/INR:  Lab Results  Component Value Date   INR 1.3 (H) 10/03/2020   INR 1.0 10/03/2020   ABG:  INR: Will add last result for INR, ABG once components are confirmed Will add last 4 CBG results once components are confirmed  Assessment/Plan:  1. CV - A flutter;history of a fib/fl. Had LA clip placed at the time of surgery.  Appears SR, first degree heart block this am.On Amiodarone drip (as of yesterday for a fib with RVR), Isordil 10 mg tid, Lopressor 25 mg bid, and Rivaroxaban 20 mg at supper. Will transition to oral Amiodarone. 2.  Pulmonary - On room air. History of OSA-on CPAP at night. Encourage incentive spirometer 3. Acute on chronic heart failure- On Torsemide 40 mg daily. Appreciate advanced heart failure's assistance. Ok to place [employer]. 4.  Expected post op acute blood loss anemia - H and H this am decreased to 7.8 and 24.2.Continue Trinsicon 6. AKI- Creatinine this am slightly increased from 1.71 to 1.82. Creatinine upon admission 1.22 .Re check in am 7. Hyponatremia-has had post op. Sodium 129. Possibly related to diuresis 8. WBC decreased from 15,000 to 13,700. Etiology to be determined. He also had a fever to 102.3 06/30;previously with increased WBC, no fever. Previous workup for leukocytosis showed UA negative for UTI, bibasilar atelectasis on CXR. Uric acid  elevated at 9.3 but has been on both Allopurinol and Colchicine. Duplex venous US ordered a few days ago but still not done;however, he did have a scan on 06/25 that was NEGATIVE. CRP 1.3 and sed rate 30. CT of chest showed approximately 37 mL of retrosternal fluid in the superior mediastinum. Abscess is not excluded, and small bilateral pleural effusions. Blood culture shows Serratia Marcescens.  9. On Vancomycin and Cefepime. As discussed with pharmacy will change to Rocephin. Will ask infectious disease  to evaluate 10. Supplement magnesium 11. When ready for discharge, will need SNF  Donielle M ZimmermanPA-C 10/18/2020,6:59 AM  Agree with above OR today for sternal debridement  Cane Dubray O Schelly Chuba

## 2020-10-18 NOTE — Progress Notes (Addendum)
Advanced Heart Failure Rounding Note  PCP-Cardiologist: Werner Lean, MD   Subjective:    -09/29/2020: admitted with NSTEMI -10/01/2020: cardiac cath demonstrated multivessel CAD -10/03/2020: CABG X 4, endarterectomy of OM1 and PDA, ASD repair, clipping of left atrial appendage -Post-op course complicated by AKI, acute blood loss anemia, ileus (resolved), and post-op atrial flutter -10/15/2020: Switched IV lasix to torsemide 40 mg po daily. Lower extremity edema predominately due to venous stasis.  -10/17/2020: started on IV amio for atypical flutter/atrial tach  -Echo, 10/02/2020: EF 50-55%, moderate concentric LVH, normal RV size and systolic function, moderate aortic valve stenosis with mean gradient of 29 mmHg -Limited echo, 10/07/2020: EF 45-50%, moderate hypokinesis/dyskinesis left ventricular, mid-apical anterior wall, anteroseptal wall and inferoapical segment, moderate aortic valve stenosis with mean gradient of 26 mmHg    Blood culture returned positive for serratia, started on cefepime.  NPO for I and D this afternoon.  Maintaining NSR on amio.  Denies fever or chills, chest pain.  Denies shortness of breath or PND.     Objective:   Weight Range: 130.8 kg Body mass index is 36.04 kg/m.   Vital Signs:   Temp:  [97.5 F (36.4 C)-98.7 F (37.1 C)] 97.5 F (36.4 C) (07/01 0724) Pulse Rate:  [67-89] 89 (07/01 0724) Resp:  [18-20] 18 (07/01 0724) BP: (83-118)/(44-68) 106/64 (07/01 0724) SpO2:  [93 %-97 %] 96 % (07/01 0724) Weight:  [130.8 kg] 130.8 kg (07/01 0335) Last BM Date: 10/17/20  Weight change: Filed Weights   10/16/20 0409 10/17/20 0128 10/18/20 0335  Weight: 133.7 kg 131.3 kg 130.8 kg    Intake/Output:   Intake/Output Summary (Last 24 hours) at 10/18/2020 0917 Last data filed at 10/18/2020 7124 Gross per 24 hour  Intake 2431.5 ml  Output 1350 ml  Net 1081.5 ml      Physical Exam    General:  No acute distress. Lying in bed. HEENT:  Normal Neck: Supple. JVD difficult to assess. Carotids 2+ bilat; no bruits. No lymphadenopathy or thyromegaly appreciated. Cor: PMI nondisplaced. RRR. 3/6 systolic murmur present. Sternal erythema lower half.   Lungs: no increased work of breathing, diminished breath sounds in bases Abdomen: Soft, nontender, nondistended.  Extremities: No cyanosis, clubbing, 3+ b/l lower extremity edema.  Neuro: Alert & orientedx3, cranial nerves grossly intact. moves all 4 extremities w/o difficulty.    Telemetry   NSR with 1st degree av block, rate 80's  Labs    CBC Recent Labs    10/17/20 0014 10/18/20 0047  WBC 15.0* 13.7*  HGB 9.0* 7.8*  HCT 27.6* 24.2*  MCV 96.8 98.0  PLT 436* 580   Basic Metabolic Panel Recent Labs    10/16/20 0034 10/17/20 0014 10/18/20 0047  NA 131* 129* 129*  K 4.4 4.3 4.1  CL 93* 89* 90*  CO2 28 31 30   GLUCOSE 110* 117* 140*  BUN 38* 33* 38*  CREATININE 1.63* 1.71* 1.82*  CALCIUM 8.2* 8.6* 8.4*  MG 1.4*  --  1.4*   Liver Function Tests Recent Labs    10/15/20 1129  AST 28  ALT 26  ALKPHOS 87  BILITOT 0.4  PROT 6.2*  ALBUMIN 3.0*   No results for input(s): LIPASE, AMYLASE in the last 72 hours. Cardiac Enzymes No results for input(s): CKTOTAL, CKMB, CKMBINDEX, TROPONINI in the last 72 hours.  BNP: BNP (last 3 results) Recent Labs    10/15/20 1129  BNP 226.8*    ProBNP (last 3 results) No results for input(s):  PROBNP in the last 8760 hours.   D-Dimer No results for input(s): DDIMER in the last 72 hours. Hemoglobin A1C No results for input(s): HGBA1C in the last 72 hours. Fasting Lipid Panel No results for input(s): CHOL, HDL, LDLCALC, TRIG, CHOLHDL, LDLDIRECT in the last 72 hours. Thyroid Function Tests No results for input(s): TSH, T4TOTAL, T3FREE, THYROIDAB in the last 72 hours.  Invalid input(s): FREET3  Other results:   Imaging    CT chest without contrast  Result Date: 10/17/2020 CLINICAL DATA:  60 year old male  postoperative day 14 status post CABG. Query mediastinal abscess. EXAM: CT CHEST WITHOUT CONTRAST TECHNIQUE: Multidetector CT imaging of the chest was performed following the standard protocol without IV contrast. COMPARISON:  Preoperative chest CT 10/02/2020. FINDINGS: Cardiovascular: Extensive calcified coronary artery atherosclerosis status post CABG. No pericardial effusion. No cardiomegaly. Normal caliber thoracic aorta with calcified plaque. Vascular patency is not evaluated in the absence of IV contrast. Mediastinum/Nodes: Chronic post granulomatous sequelae with bulky calcified subcarinal lymph nodes. Smaller calcified left hilar lymph nodes. Small reactive appearing superior diaphragmatic lymph nodes at the bilateral cardiophrenic angles (12 mm series 3, image 106). Sequelae of median sternotomy with small volume simple density fluid collection situated beneath the sternum and the pre-vascular space on series 3, image 45. This encompasses about 24 by 51 by 61 mm (AP by transverse by CC) for an estimated volume of 37 mL. There is only mild stranding in the adjacent mediastinal fat, which could be postoperative. Otherwise expected soft tissue changes surrounding the sternum status post sternotomy. And there is no superior mediastinal lymphadenopathy. No soft tissue gas identified. Lungs/Pleura: Major airways are patent with trace retained secretions in the trachea. Small new bilateral pleural effusions are mostly layering. Fluid may be mildly loculated in the posterior right upper lobe on series 3, image 40. Low-density fluid favoring transudate. Superimposed lung base atelectasis. Mild scattered bilateral perihilar atelectasis. Calcified left lower lobe granuloma redemonstrated at the lung/effusion interface. No pneumothorax. Upper Abdomen: Stable sequelae of gastric banding. Negative visible noncontrast liver, gallbladder, spleen (calcified granulomas), pancreas and adrenal glands. Increased air and fluid  distension of the distal stomach. Musculoskeletal: Interval sternotomy. Sternotomy wires in place. No acute or suspicious osseous lesion identified. IMPRESSION: 1. Interval median sternotomy and CABG with approximately 37 mL of retrosternal fluid in the superior mediastinum. Abscess is not excluded, but simple density and lack of regional lymphadenopathy suggest that postoperative seroma may be most likely. No suspicious bony changes to the sternum. No soft tissue gas. 2. There are mildly reactive appearing lymph nodes along the surface of the diaphragm. No pericardial effusion. 3. Small new bilateral pleural effusions with dependent and perihilar atelectasis. No pneumothorax. 4. 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 Electronically Signed   By: Genevie Ann M.D.   On: 10/17/2020 11:54     Medications:     Scheduled Medications:  allopurinol  300 mg Oral Daily   amiodarone  400 mg Oral BID   aspirin EC  81 mg Oral Daily   atorvastatin  80 mg Oral Daily   colchicine  0.3 mg Oral BID   feeding supplement  237 mL Oral BID BM   ferrous [id number] C-folic acid  1 capsule Oral BID PC   isosorbide dinitrate  10 mg Oral TID   magnesium oxide  400 mg Oral BID   metoprolol tartrate  25 mg Oral BID   pantoprazole  40 mg Oral Daily   potassium chloride  20  mEq Oral Daily   rivaroxaban  20 mg Oral Q supper   torsemide  40 mg Oral Daily    Infusions:  sodium chloride     sodium chloride 20 mL/hr at 10/03/20 1507   amiodarone 30 mg/hr (10/18/20 0113)   ceFEPime (MAXIPIME) IV 2 g (10/18/20 0908)    PRN Medications: acetaminophen, dextrose, ipratropium-albuterol, metoprolol tartrate, ondansetron (ZOFRAN) IV, oxyCODONE, simethicone, sodium chloride flush, sodium chloride flush, traMADol    Assessment/Plan  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 EF improved to 50-55% after atrial flutter  ablation. Echo 06/15 with EF 50-55.  EF 45-50% with multiple wall motion abnormalities on limited echo 06/20. -Leg edema likely multifactorial, primarily due to venous stasis with some element of CHF.  Known history of chronic left LE DVT. No new DVT on [place] yesterday.  -ACE wraps ordered. Can also consider [place] boots. Leg elevation. -Creatinine up just slightly, 1.82 this am and 1.71 yesterday. Torsemide held for NPO and repeat surgical procedure, weight down to admission weight of 288lbs.   -GDMT limited by AKI, hypotension -Consider switching metoprolol tartrate to metoprolol succinate at D/C. May need to hold BB if blood pressure remains low.  Atrial arrhythmias: -s/p atrial flutter ablation November 2021. -clipping of left atrial appendage with Pro-V clip at time of CABG. -Recurrent atrial flutter noted post-op. Has been on amiodarone.  Metoprolol tartrate added and increased to 25 mg BID on 06/26.  -6/30 had several hours of atrial fibrillation with RVR and runs of reentrant tachycardia. Started on IV amio from oral amio, also on metoprolol tartrate, maintaining NSR -Anticoagulated with Xarelto. Continue to monitor hemoglobin with anticoagulation.  Coronary artery disease: -NSTEMI on 09/29/2020. -S/p CABG X 4 and endarterectomy of OM2 and PDA. -CT surgery managing.  4. Aortic valve stenosis: -Moderate in severity with mean gradient of 26 mmHg on echo this admission.  5. Hx left lower extremity DVT: -Anticoagulated with Xarelto. -Venous duplex yesterday with no evidence of DVT RLE, chronic DVT left popliteal vein.  6. ASD -S/p repair at time of CABG.  7. AKI  -creatinine up to 2.99 post -op, creatinine 1.8 today  -Baseline 1.1 - 1.2.  8. Hyponatremia -Sodium as low as 125 on 06/20, had been low 130s several days. 129 today.  9. Post-op acute blood loss anemia -Hemoglobin 7.8 today -CT surgery managing  10. OSA -On CPAP.  11. Sternal wound infection, serratia  bacteremia -Started on cefepime -going back to OR this afternoon for source control  12. Acute gout flare On colchicine and allopurinol.  Length of Stay: [place], MD  10/18/2020, 9:17 AM  Advanced Heart Failure Team Pager [phone] (M-F; 7a - 5p)  Please contact [facility] for night-coverage after hours (5p -7a ) and weekends on [email or web address]   Katherine Roan, MD  9:17 AM  Patient seen and examined with the above-signed Advanced Practice Provider and/or Housestaff. I personally reviewed laboratory data, imaging studies and relevant notes. I independently examined the patient and formulated the important aspects of the plan. I have edited the note to reflect any of my changes or salient points. I have personally discussed the plan with the patient and/or family.  Weight down to baseline but still with 2-3+ LE edema. Wearing ACE wraps at times. Refuses TED. SCr up slightly. Torsemide on hold pending OR  Bcx with serratia. Going to OR to I&D sternal wound   General:  Sitting in chair No resp difficulty HEENT: normal Neck: supple. no JVD. Carotids 2+ bilat; no bruits. No lymphadenopathy or thryomegaly appreciated. Cor: PMI nondisplaced. Sternal wound dressed  Regular rate & rhythm. No rubs, gallops or murmurs. Lungs: clear Abdomen: obese soft, nontender, nondistended. No hepatosplenomegaly. No bruits or masses. Good bowel sounds. Extremities: no cyanosis, clubbing, rash, 2-3+ edema Neuro: alert & orientedx3, cranial nerves grossly intact. moves all 4 extremities w/o difficulty. Affect pleasant  To OR today for I&D. Continue to wrap LE as tolerated, Can restart torsemide tomorrow. Suspect majority of edema related to venous insufficiency at this point.   Glori Bickers, MD  4:21 PM

## 2020-10-18 NOTE — Consult Note (Signed)
[facility] for Infectious Disease    Date of Admission:  09/29/2020     Reason for Consult: serratia bacteremia    Referring Provider: Orvan Seen   Lines:  peripheral  Abx: 6/30-c cefepime  6/30 vanc 6/16-19 cefazolin        Assessment: Serratia marcescens bsi Post op retrosternal seroma Chronic left popliteal dvt S/p cabg with atrial appendage clip and bovine pericardial patch repair ASD on 6/16   60 yo male obese, chronic left LE dvt, gout, htn/hlp, s/p lap band surgery, thoracic aortic aneurysm, afib, cad admitted 6/12 for unstable angina/nstemi, s/p cabg 4/03, course complicated by rising leukocytosis found to have serratia bacteremia and a retrosternal fluid collection  Patient has low grade fever on 6/16 and wbc that improved with periop cefazolin of 3 days/cabg.  Started having new leukocytosis again starting 10 day postop. 6/30 Bcx obtained on pod 13 grew serratia. Ct noncontrast showed restrosternal fluid collection  Patient without tenderness in the chest nor sign of soft tissue infection along the previous surgical site. Ct without osseous erosion. Unclear if fluid collection is infected. Primary team though plans to I&D the fluid collection  At this time unclear source of the serratia bacteremia.   Of note, patient complains of volume overload (edema LE; 6/20 tte 45% ef) probably chf, which primary team managing  Plan: Continue cefepime Await primary team I&D of the fluid collection; please send culture No obvious osseous changes on ct scan. Depending on the operative finding, will determine how long we need to treat Discussed with primary team  I spent 60 minute reviewing data/chart, and coordinating care and >50% direct face to face time providing counseling/discussing diagnostics/treatment plan with patient    ------------------------------------------------ Principal Problem:   Acute coronary syndrome ([place]) Active Problems:    Hyperlipidemia   Gout   Essential hypertension   OSA (obstructive sleep apnea)   Chronic deep vein thrombosis (DVT) of distal vein of left lower extremity (HCC)   Typical atrial flutter (HCC)   Class 2 obesity   NSTEMI (non-ST elevated myocardial infarction) ([place])   Coronary artery disease   Acute on chronic combined systolic and diastolic CHF (congestive heart failure) ([place])    HPI: Matthew Savage is a 60 y.o. male CAD, obese, admitted 6/12 with nstemi and undergon cabg 6/16, subsequently developed sepsis found to have serratia bacteremia and retrosternal fluid collection  Hx via patient/chart review  Patient without sepsis on admission   The day of surgery postop has some leukocytosis in setting of acute blood loss anemia, ileus, and atrial flutter. There was a low grade fever 100.5 isolated on 06/16 day of surgery. No sepsis w/u  Tte 6/20 showed ef 45%  Received 3 days cefazolin periop with resolution of leukocytosis  On 6/26 rising leukocytosis again On 6/30 bcx obtained and grew serratia. Cefepime started. Same day ct chest showed restrosternal fluid collection  Patient denies chest pain. Incision healed well  No fever  He has had no central line  No n/v/diarrhea No other rash No cough/chest pain/dyspnea No headache No dysuria    Family History  Problem Relation Age of Onset   Breast cancer Mother    Other Mother        small cell carcinoma   Hypertension Father    Colon polyps Father    Colon polyps Brother     Social History   Tobacco Use   Smoking status: Former    Packs/day:  2.00    Years: 36.00    Pack years: 72.00    Types: Cigarettes    Quit date: 01/01/2020    Years since quitting: 0.7   Smokeless tobacco: Never  Substance Use Topics   Alcohol use: Yes    Alcohol/week: 12.0 standard drinks    Types: 12 Standard drinks or equivalent per week    Comment: 2 per day   Drug use: No    Allergies  Allergen Reactions   Levaquin  [Levofloxacin]     Aortic Aneurysm    Review of Systems: ROS All Other ROS was negative, except mentioned above   Past Medical History:  Diagnosis Date   Blood in urine    Class 2 obesity 09/30/2020   DDD (degenerative disc disease)    DVT (deep venous thrombosis) (HCC) 2017 & 2018   Gout    Heart murmur    Hyperlipidemia    Hypertension    Numbness and tingling    right arm and hand   SCC (squamous cell carcinoma)    skin, squamous cell, face       Scheduled Meds:  allopurinol  300 mg Oral Daily   amiodarone  400 mg Oral BID   aspirin EC  81 mg Oral Daily   atorvastatin  80 mg Oral Daily   colchicine  0.3 mg Oral BID   feeding supplement  237 mL Oral BID BM   ferrous [id number] C-folic acid  1 capsule Oral BID PC   isosorbide dinitrate  10 mg Oral TID   magnesium oxide  400 mg Oral BID   metoprolol tartrate  25 mg Oral BID   pantoprazole  40 mg Oral Daily   potassium chloride  20 mEq Oral Daily   rivaroxaban  20 mg Oral Q supper   Continuous Infusions:  sodium chloride     sodium chloride 20 mL/hr at 10/03/20 1507   amiodarone 30 mg/hr (10/18/20 0924)   ceFEPime (MAXIPIME) IV 2 g (10/18/20 0908)   PRN Meds:.acetaminophen, dextrose, ipratropium-albuterol, metoprolol tartrate, ondansetron (ZOFRAN) IV, oxyCODONE, simethicone, sodium chloride flush, sodium chloride flush, traMADol   OBJECTIVE: Blood pressure 117/72, pulse 69, temperature 98 F (36.7 C), temperature source Oral, resp. rate 18, height 6\' 3"  (1.905 m), weight 130.8 kg, SpO2 96 %.  Physical Exam General/constitutional: obese, no distress, pleasant HEENT: Normocephalic, PER, Conj Clear, EOMI, Oropharynx clear Neck supple CV: rrr, systolic murmur precordium. Sternal incision healed; no erythema/swelling/purulence Lungs: clear to auscultation, normal respiratory effort Abd: Soft, Nontender Ext: 1+ bilateral LE edema Skin: No Rash Neuro: nonfocal MSK: no peripheral joint  swelling/tenderness/warmth; back spines nontender Psych: alert/oriented   Lab Results Lab Results  Component Value Date   WBC 13.7 (H) 10/18/2020   HGB 7.8 (L) 10/18/2020   HCT 24.2 (L) 10/18/2020   MCV 98.0 10/18/2020   PLT 349 10/18/2020    Lab Results  Component Value Date   CREATININE 1.82 (H) 10/18/2020   BUN 38 (H) 10/18/2020   NA 129 (L) 10/18/2020   K 4.1 10/18/2020   CL 90 (L) 10/18/2020   CO2 30 10/18/2020    Lab Results  Component Value Date   ALT 26 10/15/2020   AST 28 10/15/2020   ALKPHOS 87 10/15/2020   BILITOT 0.4 10/15/2020      Microbiology: Recent Results (from the past 240 hour(s))  Culture, blood (routine x 2)     Status: Abnormal (Preliminary result)   Collection Time: 10/17/20  2:30 AM   Specimen:  BLOOD LEFT HAND  Result Value Ref Range Status   Specimen Description BLOOD LEFT HAND  Final   Special Requests   Final    BOTTLES DRAWN AEROBIC AND ANAEROBIC Blood Culture adequate volume   Culture  Setup Time   Final    IN BOTH AEROBIC AND ANAEROBIC BOTTLES GRAM NEGATIVE RODS CRITICAL VALUE NOTED.  VALUE IS CONSISTENT WITH PREVIOUSLY REPORTED AND CALLED VALUE. Performed at [facility], [place] [address] [place], [place] [address]    Culture SERRATIA MARCESCENS (A)  Final   Report Status PENDING  Incomplete  Culture, blood (routine x 2)     Status: Abnormal (Preliminary result)   Collection Time: 10/17/20  2:30 AM   Specimen: BLOOD  Result Value Ref Range Status   Specimen Description BLOOD RIGHT ANTECUBITAL  Final   Special Requests   Final    BOTTLES DRAWN AEROBIC AND ANAEROBIC Blood Culture adequate volume   Culture  Setup Time   Final    AEROBIC BOTTLE ONLY GRAM NEGATIVE RODS CRITICAL RESULT CALLED TO, READ BACK BY AND VERIFIED WITH: G ABBOTT PHARMD 10/18/20 0208 JDW    Culture (A)  Final    SERRATIA MARCESCENS SUSCEPTIBILITIES TO FOLLOW Performed at [facility], [place] [address], [place], [place] [address]    Report Status  PENDING  Incomplete  Blood Culture ID Panel (Reflexed)     Status: Abnormal   Collection Time: 10/17/20  2:30 AM  Result Value Ref Range Status   Enterococcus faecalis NOT DETECTED NOT DETECTED Final   Enterococcus Faecium NOT DETECTED NOT DETECTED Final   Listeria monocytogenes NOT DETECTED NOT DETECTED Final   Staphylococcus species NOT DETECTED NOT DETECTED Final   Staphylococcus aureus (BCID) NOT DETECTED NOT DETECTED Final   Staphylococcus epidermidis NOT DETECTED NOT DETECTED Final   Staphylococcus lugdunensis NOT DETECTED NOT DETECTED Final   Streptococcus species NOT DETECTED NOT DETECTED Final   Streptococcus agalactiae NOT DETECTED NOT DETECTED Final   Streptococcus pneumoniae NOT DETECTED NOT DETECTED Final   Streptococcus pyogenes NOT DETECTED NOT DETECTED Final   A.calcoaceticus-baumannii NOT DETECTED NOT DETECTED Final   Bacteroides fragilis NOT DETECTED NOT DETECTED Final   Enterobacterales DETECTED (A) NOT DETECTED Final    Comment: Enterobacterales represent a large order of gram negative bacteria, not a single organism. CRITICAL RESULT CALLED TO, READ BACK BY AND VERIFIED WITH: G ABBOTT PHARMD 10/18/20 0208 JDW    Enterobacter cloacae complex NOT DETECTED NOT DETECTED Final   Escherichia coli NOT DETECTED NOT DETECTED Final   Klebsiella aerogenes NOT DETECTED NOT DETECTED Final   Klebsiella oxytoca NOT DETECTED NOT DETECTED Final   Klebsiella pneumoniae NOT DETECTED NOT DETECTED Final   Proteus species NOT DETECTED NOT DETECTED Final   Salmonella species NOT DETECTED NOT DETECTED Final   Serratia marcescens DETECTED (A) NOT DETECTED Final    Comment: CRITICAL RESULT CALLED TO, READ BACK BY AND VERIFIED WITH: G ABBOTT PHARMD 10/18/20 0208 JDW    Haemophilus influenzae NOT DETECTED NOT DETECTED Final   Neisseria meningitidis NOT DETECTED NOT DETECTED Final   Pseudomonas aeruginosa NOT DETECTED NOT DETECTED Final   Stenotrophomonas maltophilia NOT DETECTED NOT DETECTED  Final   Candida albicans NOT DETECTED NOT DETECTED Final   Candida auris NOT DETECTED NOT DETECTED Final   Candida glabrata NOT DETECTED NOT DETECTED Final   Candida krusei NOT DETECTED NOT DETECTED Final   Candida parapsilosis NOT DETECTED NOT DETECTED Final   Candida tropicalis NOT DETECTED NOT DETECTED  Final   Cryptococcus neoformans/gattii NOT DETECTED NOT DETECTED Final   CTX-M ESBL NOT DETECTED NOT DETECTED Final   Carbapenem resistance IMP NOT DETECTED NOT DETECTED Final   Carbapenem resistance KPC NOT DETECTED NOT DETECTED Final   Carbapenem resistance NDM NOT DETECTED NOT DETECTED Final   Carbapenem resist OXA 48 LIKE NOT DETECTED NOT DETECTED Final   Carbapenem resistance VIM NOT DETECTED NOT DETECTED Final    Comment: Performed at [facility], [place] [address] [place], [place] [address]     Serology:    Imaging: If present, new imagings (plain films, ct scans, and mri) have been personally visualized and interpreted; radiology reports have been reviewed. Decision making incorporated into the Impression / Recommendations.  6/30 ct chest noncontrast 1. Interval median sternotomy and CABG with approximately 37 mL of retrosternal fluid in the superior mediastinum. Abscess is not excluded, but simple density and lack of regional lymphadenopathy suggest that postoperative seroma may be most likely. No suspicious bony changes to the sternum. No soft tissue gas.   2. There are mildly reactive appearing lymph nodes along the surface of the diaphragm. No pericardial effusion.   3. Small new bilateral pleural effusions with dependent and perihilar atelectasis. No pneumothorax.   4. Chronic post granulomatous sequelae in the chest and upper Abdomen   6/29 duplex u/s LE RIGHT:  - There is no evidence of deep vein thrombosis in the lower extremity.     - No cystic structure found in the popliteal fossa.     LEFT:  - Findings consistent with chronic deep vein  thrombosis involving the left  popliteal vein.  - Findings appear essentially unchanged compared to previous examination.  - No cystic structure found in the popliteal fossa.    6/22 ct abd pelv without contrast 1. Dilated fluid-filled small bowel with gradual tapering to nondilated distal small bowel. No discrete transition point. Favor small bowel ileus over partial obstruction. Left colonic diverticulosis without diverticulitis. 2. Small volume abdominopelvic ascites. Small bilateral pleural effusions.  Jabier Mutton, [facility] for Infectious [place] [phone] pager    10/18/2020, 12:15 PM

## 2020-10-18 NOTE — TOC Progression Note (Signed)
Transition of Care [facility]) - Progression Note    Patient Details  Name: Matthew Savage MRN: [id number] Date of Birth: 1960-12-11  Transition [facility]) CM/SW Contact  Zenon Mayo, RN Phone Number: 10/18/2020, 8:50 AM  Clinical Narrative:    Per Junie Panning PA,  Seretia found in blood cx's, for I an D today in the OR of sternal.  May need iv abx at discharge when goes to SNF.         Expected Discharge Plan and Services                                                 Social Determinants of Health (SDOH) Interventions    Readmission Risk Interventions No flowsheet data found.

## 2020-10-18 NOTE — Anesthesia Preprocedure Evaluation (Addendum)
Anesthesia Evaluation  Patient identified by MRN, date of birth, ID band Patient awake    Reviewed: Allergy & Precautions, NPO status , Patient's Chart, lab work & pertinent test results  Airway Mallampati: II  TM Distance: >3 FB     Dental   Pulmonary COPD, former smoker,    breath sounds clear to auscultation       Cardiovascular hypertension, + CAD, + Past MI and +CHF  + Valvular Problems/Murmurs  Rhythm:Regular Rate:Normal     Neuro/Psych    GI/Hepatic negative GI ROS, Neg liver ROS,   Endo/Other  negative endocrine ROS  Renal/GU negative Renal ROS     Musculoskeletal  (+) Arthritis ,   Abdominal   Peds  Hematology   Anesthesia Other Findings   Reproductive/Obstetrics                             Anesthesia Physical Anesthesia Plan  ASA: 3  Anesthesia Plan: General   Post-op Pain Management:    Induction: Intravenous  PONV Risk Score and Plan: 2 and Ondansetron, Dexamethasone and Midazolam  Airway Management Planned: Oral ETT  Additional Equipment: Arterial line  Intra-op Plan:   Post-operative Plan: Possible Post-op intubation/ventilation  Informed Consent: I have reviewed the patients History and Physical, chart, labs and discussed the procedure including the risks, benefits and alternatives for the proposed anesthesia with the patient or authorized representative who has indicated his/her understanding and acceptance.     Dental advisory given  Plan Discussed with: CRNA and Anesthesiologist  Anesthesia Plan Comments:         Anesthesia Quick Evaluation

## 2020-10-18 NOTE — Progress Notes (Signed)
CARDIAC REHAB PHASE I   PRE:  Rate/Rhythm: 70 SR  MODE:  Ambulation: 200 ft   POST:  Rate/Rhythm: 105 Afib  BP:  Sitting: 117/72      SaO2: 96 RA  Pt tolerated exercise fair and ambulated 200 ft with front wheel walker and standby assist. Pt went in and out of Afib. Pt C/O of foot pain 7/10. Pt frustrated and resistive to ambulation.  [id number]  Matthew Plumber, MS, ACM-CEP 10/18/2020 10:51 AM

## 2020-10-18 NOTE — Progress Notes (Signed)
Amio drip continued and PO transition delayed, per Matthew Savage, in anticipation of I&D later today. All PO meds help, Pt NPO at this time.

## 2020-10-18 NOTE — Progress Notes (Signed)
Pharmacy Antibiotic Note  Matthew Savage is a 60 y.o. male admitted on 09/29/2020 with concern for sternal wound infection. Now with serratia marcescens in 2/2 blood cultures. 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   WBC today down to 13.7  and patient afebrile. CT of chest  showed a retrosternal fluid collection. Scr trending up 1.71>>1.87 with CrCl ~ 60 ml/min.     Plan: Cefepime 2 gm IV Q 8 hours Monitor renal function closely for need for dose reduction   Height: 6\' 3"  (190.5 cm) Weight: 130.8 kg (288 lb 5.8 oz) IBW/kg (Calculated) : 84.5  Temp (24hrs), Avg:98.3 F (36.8 C), Min:97.5 F (36.4 C), Max:98.7 F (37.1 C)  Recent Labs  Lab 10/14/20 0021 10/15/20 0116 10/15/20 1129 10/16/20 0034 10/17/20 0014 10/17/20 0851 10/18/20 0047  WBC 16.4* 14.0*  --  11.4* 15.0*  --  13.7*  CREATININE 1.57* 1.68* 1.65* 1.63* 1.71*  --  1.82*  LATICACIDVEN  --   --   --   --   --  1.4  --      Estimated Creatinine Clearance: 62.9 mL/min (A) (by C-G formula based on SCr of 1.82 mg/dL (H)).    Allergies  Allergen Reactions   Levaquin [Levofloxacin]     Aortic Aneurysm    Antimicrobials this admission: Vancomycin 6/30 >> 7/1 Cefepime 6/30 >>   Dose adjustments this admission: N/A  Microbiology results: 6/30 BCx: 2/2 serratia  6/13 COVID PCR: neg  Thank you for allowing pharmacy to be a part of this patient's care.  Jimmy Footman, PharmD, BCPS, BCIDP Infectious Diseases Clinical Pharmacist Phone: [phone] 10/18/2020 8:54 AM  Please check AMION for all [facility] phone numbers After 10:00 PM, call [facility] [phone]

## 2020-10-18 NOTE — Op Note (Signed)
      [address]       [place] [address]             [phone]                                          10/18/2020 Patient:  Matthew Savage  Pre-Op Dx:  Concern for sternal wound infection   Retrosternal fluid collection Status post CABG  Post-op Dx: Same Procedure:  Inferior sternal wound debridement Wound VAC placement  Surgeon and Role:      * Colan Laymon, Lucile Crater, MD - Primary  Anesthesia  general EBL: Minimal ml Blood Administration: None   Drains: 72 F blake drain: mediastinal   Counts: correct   Indications: This is a 59 year old gentleman that underwent a coronary artery bypass graft with bilateral IMA harvest on 10/03/2020.  During the course of his hospitalization he has developed some inferior sternal incision drainage.  He also developed a fever and a leukocytosis.  Cross-sectional imaging revealed a retrosternal fluid collection without any disruption of the sternum.  Due to concern for deep sternal wound infection, he was brought to the operating theater for a wound debridement and exploration.  Findings: There was some erythema at the lower border of the incision.  There was mostly some fat necrosis with some murky fluid superficial to the sternum.  The sternum was intact.  There was some old bloody fluid that was evacuated from underneath the sternum.  Operative Technique: The patient was brought to the operative theatre.  Anesthesia was induced, and the patient was prepped and draped in normal sterile fashion.  An appropriate surgical pause was performed, and pre-operative antibiotics were dosed accordingly.  The lower edge of the incision was opened sharply.  All sutures were removed.  We opened the lower portion of the incision down to the sternum.  There is no obvious fluid collections above the sternal but there was some old blood that was evacuated from underneath the sternum.  Superficial and deep sternal cultures were taken.   Retention sutures were placed at the top of her incision.  A 19 [place] Blake drain was then passed through one of the previous chest tube incisions underneath the sternum.  A wound VAC was then applied.    The patient tolerated the procedure without any immediate complications, and was transferred to the PACU in stable condition.  Ilma Achee Bary Leriche

## 2020-10-18 NOTE — Progress Notes (Signed)
I spoke with Dr. Gale Journey from infectious disease. He recommends Cefepime so will order. He will evaluate patient later today.

## 2020-10-18 NOTE — Progress Notes (Signed)
PHARMACY - PHYSICIAN COMMUNICATION CRITICAL VALUE ALERT - BLOOD CULTURE IDENTIFICATION (BCID)  Matthew Savage is an 60 y.o. male s/p CABG 6/16, now with fevers and possible sternal wound infection  Assessment:   2/2 blood cultures growing Serratia marcescens  Name of physician (or Provider) Contacted:   Josie Saunders  Current antibiotics: Vancomycin and Cefepime   Changes to prescribed antibiotics recommended:   Consider narrowing to Rocephin 2 g IV q24h   Results for orders placed or performed during the hospital encounter of 09/29/20  Blood Culture ID Panel (Reflexed) (Collected: 10/17/2020  2:30 AM)  Result Value Ref Range   Enterococcus faecalis NOT DETECTED NOT DETECTED   Enterococcus Faecium NOT DETECTED NOT DETECTED   Listeria monocytogenes NOT DETECTED NOT DETECTED   Staphylococcus species NOT DETECTED NOT DETECTED   Staphylococcus aureus (BCID) NOT DETECTED NOT DETECTED   Staphylococcus epidermidis NOT DETECTED NOT DETECTED   Staphylococcus lugdunensis NOT DETECTED NOT DETECTED   Streptococcus species NOT DETECTED NOT DETECTED   Streptococcus agalactiae NOT DETECTED NOT DETECTED   Streptococcus pneumoniae NOT DETECTED NOT DETECTED   Streptococcus pyogenes NOT DETECTED NOT DETECTED   A.calcoaceticus-baumannii NOT DETECTED NOT DETECTED   Bacteroides fragilis NOT DETECTED NOT DETECTED   Enterobacterales DETECTED (A) NOT DETECTED   Enterobacter cloacae complex NOT DETECTED NOT DETECTED   Escherichia coli NOT DETECTED NOT DETECTED   Klebsiella aerogenes NOT DETECTED NOT DETECTED   Klebsiella oxytoca NOT DETECTED NOT DETECTED   Klebsiella pneumoniae NOT DETECTED NOT DETECTED   Proteus species NOT DETECTED NOT DETECTED   Salmonella species NOT DETECTED NOT DETECTED   Serratia marcescens DETECTED (A) NOT DETECTED   Haemophilus influenzae NOT DETECTED NOT DETECTED   Neisseria meningitidis NOT DETECTED NOT DETECTED   Pseudomonas aeruginosa NOT DETECTED NOT DETECTED    Stenotrophomonas maltophilia NOT DETECTED NOT DETECTED   Candida albicans NOT DETECTED NOT DETECTED   Candida auris NOT DETECTED NOT DETECTED   Candida glabrata NOT DETECTED NOT DETECTED   Candida krusei NOT DETECTED NOT DETECTED   Candida parapsilosis NOT DETECTED NOT DETECTED   Candida tropicalis NOT DETECTED NOT DETECTED   Cryptococcus neoformans/gattii NOT DETECTED NOT DETECTED   CTX-M ESBL NOT DETECTED NOT DETECTED   Carbapenem resistance IMP NOT DETECTED NOT DETECTED   Carbapenem resistance KPC NOT DETECTED NOT DETECTED   Carbapenem resistance NDM NOT DETECTED NOT DETECTED   Carbapenem resist OXA 48 LIKE NOT DETECTED NOT DETECTED   Carbapenem resistance VIM NOT DETECTED NOT DETECTED    Caryl Pina 10/18/2020  2:32 AM

## 2020-10-18 NOTE — Anesthesia Procedure Notes (Signed)
Arterial Line Insertion Start/End7/04/2020 3:45 PM, 10/18/2020 4:00 PM Performed by: Josephine Igo, CRNA, CRNA  Patient location: Pre-op. Preanesthetic checklist: patient identified, surgical consent, monitors and equipment checked and pre-op evaluation Patient sedated Left, radial was placed Catheter size: 20 G Hand hygiene performed , maximum sterile barriers used  and Seldinger technique used  Attempts: 1 Procedure performed without using ultrasound guided technique. Following insertion, dressing applied and Biopatch. Post procedure assessment: normal  Patient tolerated the procedure well with no immediate complications.

## 2020-10-18 NOTE — Progress Notes (Signed)
Occupational Therapy Treatment Patient Details Name: Matthew Savage MRN: [id number] DOB: 27-Jul-1960 Today's Date: 10/18/2020    History of present illness Pt is a 60 y/o male presenting on 6/16 for CABG with TEE, and ICG with diagnosis of CAD. Pt extubated 6/16. Also, on 10/13/20 noted in MD notes pt with volume overload/chronic diastolic HF with lasix initiated to diurese.  Found with Sternal wound infection 6/30, plan for further surgery 7/1. PMH includes: hematuria, class II obesity, DDD, chronic DVT, gout, heart murmur, hyperlipidemia, hypertension, lap band surgery, numbness and tingling, SCC of face, mild AVS, TAA, atrial fibrillation/typical atrial flutter with ablation, CAD multiple MIs.   OT comments  Patient supine in bed and agreeable to OT session. Reviewed sternal precautions and requires cueing to adhere functionally, frustrated with cues and voicing "I'm just supporting myself, I'm not pulling". Reviewed ADL techniques, especially for toileting as pt reports reaching behind to wipe.  Requires assist to don socks due to edema of LB, supervision for transfers and grooming at sink.  Will follow acutely.    Follow Up Recommendations  SNF;Supervision/Assistance - 24 hour    Equipment Recommendations  None recommended by OT    Recommendations for Other Services      Precautions / Restrictions Precautions Precautions: Sternal Precaution Comments: reviewed precautions with pt, required cueing to adhere functionally Restrictions Weight Bearing Restrictions: Yes Other Position/Activity Restrictions: sternal precautions       Mobility Bed Mobility Overal bed mobility: Needs Assistance Bed Mobility: Supine to Sit     Supine to sit: Supervision     General bed mobility comments: HOB slightly elevated, cueing to adhere to sternal precautions    Transfers Overall transfer level: Needs assistance Equipment used: None Transfers: Sit to/from Stand Sit to Stand: Supervision          General transfer comment: for line mgmt and safety, cueing to sternal precautions and hand placement    Balance Overall balance assessment: No apparent balance deficits (not formally assessed)                                         ADL either performed or assessed with clinical judgement   ADL Overall ADL's : Needs assistance/impaired     Grooming: Standing;Supervision/safety Grooming Details (indicate cue type and reason): standing at sink             Lower Body Dressing: Minimal assistance;Sit to/from stand Lower Body Dressing Details (indicate cue type and reason): requires assist for socks Toilet Transfer: Supervision/safety;Ambulation Toilet Transfer Details (indicate cue type and reason): for line mgmt Toileting- Clothing Manipulation and Hygiene: Supervision/safety;Sitting/lateral lean;Sit to/from stand Toileting - Clothing Manipulation Details (indicate cue type and reason): cueing to adhere to sternal precautions     Functional mobility during ADLs: Supervision/safety;Cueing for safety General ADL Comments: pt limited by sternal precautions and LB edema     Vision       Perception     Praxis      Cognition Arousal/Alertness: Awake/alert Behavior During Therapy: WFL for tasks assessed/performed Overall Cognitive Status: Within Functional Limits for tasks assessed                                 General Comments: appears WFL, pt easily agitated and frustrated due to need for further surgery.  Requires cueing to adhere  to sternal precautions        Exercises     Shoulder Instructions       General Comments      Pertinent Vitals/ Pain       Pain Assessment: Faces Faces Pain Scale: Hurts a little bit Pain Location: B feet Pain Descriptors / Indicators: Discomfort Pain Intervention(s): Monitored during session;Repositioned;Limited activity within patient's tolerance  Home Living                                           Prior Functioning/Environment              Frequency  Min 2X/week        Progress Toward Goals  OT Goals(current goals can now be found in the care plan section)  Progress towards OT goals: Progressing toward goals  Acute Rehab OT Goals Patient Stated Goal: get stronger OT Goal Formulation: With patient  Plan Discharge plan remains appropriate;Frequency remains appropriate    Co-evaluation                 AM-PAC OT "6 Clicks" Daily Activity     Outcome Measure   Help from another person eating meals?: A Little Help from another person taking care of personal grooming?: A Little Help from another person toileting, which includes using toliet, bedpan, or urinal?: A Little Help from another person bathing (including washing, rinsing, drying)?: A Lot Help from another person to put on and taking off regular upper body clothing?: A Little Help from another person to put on and taking off regular lower body clothing?: A Lot 6 Click Score: 16    End of Session    OT Visit Diagnosis: Other abnormalities of gait and mobility (R26.89);Muscle weakness (generalized) (M62.81)   Activity Tolerance Patient tolerated treatment well   Patient Left in chair;with call bell/phone within reach;with nursing/sitter in room   Nurse Communication Mobility status;Precautions        Time: 6712-4580 OT Time Calculation (min): 22 min  Charges: OT General Charges $OT Visit: 1 Visit OT Treatments $Self Care/Home Management : 8-22 mins  Jolaine Artist, OT [facility] Pager [phone] Office [place] 10/18/2020, 10:17 AM

## 2020-10-18 NOTE — Transfer of Care (Signed)
Immediate Anesthesia Transfer of Care Note  Patient: Matthew Savage  Procedure(s) Performed: STERNAL WOUND DEBRIDEMENT APPLICATION OF WOUND VAC  Patient Location: PACU  Anesthesia Type:General  Level of Consciousness: awake and alert   Airway & Oxygen Therapy: Patient Spontanous Breathing and Patient connected to nasal cannula oxygen  Post-op Assessment: Report given to RN and Post -op Vital signs reviewed and stable  Post vital signs: Reviewed and stable  Last Vitals:  Vitals Value Taken Time  BP 123/75 10/18/20 1712  Temp    Pulse 78 10/18/20 1712  Resp 15 10/18/20 1712  SpO2 96 % 10/18/20 1712  Vitals shown include unvalidated device data.  Last Pain:  Vitals:   10/18/20 1504  TempSrc: Oral  PainSc: 0-No pain      Patients Stated Pain Goal: 0 (62/95/28 4132)  Complications: No notable events documented.

## 2020-10-19 ENCOUNTER — Other Ambulatory Visit: Payer: Self-pay | Admitting: Adult Health

## 2020-10-19 ENCOUNTER — Encounter (HOSPITAL_COMMUNITY): Payer: Self-pay | Admitting: Thoracic Surgery (Cardiothoracic Vascular Surgery)

## 2020-10-19 LAB — CULTURE, BLOOD (ROUTINE X 2)
Special Requests: ADEQUATE
Special Requests: ADEQUATE

## 2020-10-19 LAB — CBC
HCT: 25.4 % — ABNORMAL LOW (ref 39.0–52.0)
Hemoglobin: 8.1 g/dL — ABNORMAL LOW (ref 13.0–17.0)
MCH: 31 pg (ref 26.0–34.0)
MCHC: 31.9 g/dL (ref 30.0–36.0)
MCV: 97.3 fL (ref 80.0–100.0)
Platelets: 314 10*3/uL (ref 150–400)
RBC: 2.61 MIL/uL — ABNORMAL LOW (ref 4.22–5.81)
RDW: 15.2 % (ref 11.5–15.5)
WBC: 11.5 10*3/uL — ABNORMAL HIGH (ref 4.0–10.5)
nRBC: 0 % (ref 0.0–0.2)

## 2020-10-19 LAB — BASIC METABOLIC PANEL
Anion gap: 9 (ref 5–15)
BUN: 30 mg/dL — ABNORMAL HIGH (ref 6–20)
CO2: 29 mmol/L (ref 22–32)
Calcium: 8.4 mg/dL — ABNORMAL LOW (ref 8.9–10.3)
Chloride: 91 mmol/L — ABNORMAL LOW (ref 98–111)
Creatinine, Ser: 1.53 mg/dL — ABNORMAL HIGH (ref 0.61–1.24)
GFR, Estimated: 52 mL/min — ABNORMAL LOW (ref >60)
Glucose, Bld: 113 mg/dL — ABNORMAL HIGH (ref 70–99)
Potassium: 4.2 mmol/L (ref 3.5–5.1)
Sodium: 129 mmol/L — ABNORMAL LOW (ref 135–145)

## 2020-10-19 MED ORDER — SIMETHICONE 80 MG PO CHEW
160.0000 mg | CHEWABLE_TABLET | Freq: Four times a day (QID) | ORAL | Status: DC | PRN
  Administered 2020-10-19 – 2020-11-03 (×24): 160 mg via ORAL
  Filled 2020-10-19 (×25): qty 2

## 2020-10-19 MED ORDER — TORSEMIDE 20 MG PO TABS: 20.0000 mg | ORAL_TABLET | Freq: Every day | ORAL | Status: DC

## 2020-10-19 MED ORDER — TORSEMIDE 20 MG PO TABS
40.0000 mg | ORAL_TABLET | Freq: Every day | ORAL | Status: DC
  Administered 2020-10-19 – 2020-10-24 (×6): 40 mg via ORAL
  Filled 2020-10-19 (×7): qty 2

## 2020-10-19 NOTE — Progress Notes (Signed)
1300 Came to see pt to walk. Just walked with NT and back to bed. Will continue to follow. Graylon Good RN BSN 10/19/2020 12:58 PM

## 2020-10-19 NOTE — Progress Notes (Signed)
Patient has home CPAP for use and will self place when ready.  

## 2020-10-19 NOTE — Plan of Care (Signed)

## 2020-10-19 NOTE — Progress Notes (Addendum)
      [address]       [place] [address]             [phone]       1 Day Post-Op Procedure(s) (LRB): STERNAL WOUND DEBRIDEMENT (N/A) APPLICATION OF WOUND VAC  Subjective:  Patient with multiple complaints.  He complains of swelling in his LE, but does state it has improved some.  He is requesting his gas medication be increased.  He is asking what is going on in regards to his surgery last night and what is causing his blood infection.  Objective: Vital signs in last 24 hours: Temp:  [97.7 F (36.5 C)-99.2 F (37.3 C)] 99.2 F (37.3 C) (07/02 0742) Pulse Rate:  [65-93] 65 (07/02 0742) Cardiac Rhythm: Normal sinus rhythm (07/02 0700) Resp:  [13-20] 18 (07/02 0742) BP: (100-131)/(50-81) 119/62 (07/02 0742) SpO2:  [90 %-100 %] 95 % (07/02 0742) Arterial Line BP: (107-134)/(49-64) 107/49 (07/01 2000) Weight:  [130.5 kg] 130.5 kg (07/02 0315)  Intake/Output from previous day: 07/01 0701 - 07/02 0700 In: 1167.3 [I.V.:967.3; IV Piggyback:200] Out: 1420 [Urine:1200; Blood:50; Chest Tube:70] Intake/Output this shift: Total I/O In: 360 [P.O.:360] Out: 100 [Urine:100]  General appearance: alert, cooperative, and no distress Heart: regular rate and rhythm Lungs: clear to auscultation bilaterally Abdomen: soft, non-tender; bowel sounds normal; no masses,  no organomegaly Extremities: edema 3-4+ Wound: wound vac in place  Lab Results: Recent Labs    10/18/20 0047 10/19/20 0024  WBC 13.7* 11.5*  HGB 7.8* 8.1*  HCT 24.2* 25.4*  PLT 349 314   BMET:  Recent Labs    10/18/20 0047 10/19/20 0024  NA 129* 129*  K 4.1 4.2  CL 90* 91*  CO2 30 29  GLUCOSE 140* 113*  BUN 38* 30*  CREATININE 1.82* 1.53*  CALCIUM 8.4* 8.4*    PT/INR: No results for input(s): LABPROT, INR in the last 72 hours. ABG    Component Value Date/Time   PHART 7.280 (L) 10/03/2020 2146   HCO3 20.5 10/03/2020 2146   TCO2 22 10/03/2020 2146   ACIDBASEDEF 6.0 (H) 10/03/2020  2146   O2SAT 97.0 10/03/2020 2146   CBG (last 3)  No results for input(s): GLUCAP in the last 72 hours.  Assessment/Plan: S/P Procedure(s) (LRB): STERNAL WOUND DEBRIDEMENT (N/A) APPLICATION OF WOUND VAC  CV- A.Flutter/Fib- in NSR currently- stop Amiodarone drip, continue Isordil for radial artery, Lopressor and restart Xarelto Pulm- no acute issues, continue IS, CPAP nightly for OSA Renal- creatinine improved at 1.53, Demadex restarted today per AHF recommendations yesterday, patient with significant edema.. may benefit from UNA boots, however not sure if patient will keep them in place Hyponatremic- stable at 129 ID- + blood cultures for Serratia Marcescens, no definitive source, patient underwent I/D of fluid collection of mediastinum yesterday, cultures sent, wound vac in place... on Cefipime per ID recommendations, appreciate assistance  Activity- needs to get up and ambulate as able, will need SNF at discharge as patient lives alone, depending on source of + blood cultures, may require IV ABX   LOS: 19 days    Ellwood Handler, PA-C 10/19/2020  Agree with above. Cultures negative.  Continue empiric IV antibiotics. Wound VAC change early next week. Dispo planning.  Matthew Savage

## 2020-10-20 LAB — BASIC METABOLIC PANEL
Anion gap: 11 (ref 5–15)
BUN: 33 mg/dL — ABNORMAL HIGH (ref 6–20)
CO2: 30 mmol/L (ref 22–32)
Calcium: 8.8 mg/dL — ABNORMAL LOW (ref 8.9–10.3)
Chloride: 90 mmol/L — ABNORMAL LOW (ref 98–111)
Creatinine, Ser: 1.63 mg/dL — ABNORMAL HIGH (ref 0.61–1.24)
GFR, Estimated: 48 mL/min — ABNORMAL LOW (ref >60)
Glucose, Bld: 106 mg/dL — ABNORMAL HIGH (ref 70–99)
Potassium: 4.4 mmol/L (ref 3.5–5.1)
Sodium: 131 mmol/L — ABNORMAL LOW (ref 135–145)

## 2020-10-20 NOTE — Progress Notes (Signed)
RT NOTE:  Pt has home CPAP setup at bedside. Pt will put on when ready.

## 2020-10-20 NOTE — Progress Notes (Signed)
Orthopedic Tech Progress Note Patient Details:  Matthew Savage 09-Dec-1960 [id number]   Ortho Devices Type of Ortho Device: Haematologist Ortho Device/Splint Location: Bilateral Ortho Device/Splint Interventions: Ordered, Application, Adjustment   Post Interventions Patient Tolerated: Well Instructions Provided: Care of device  [name] Jeri Modena 10/20/2020, 3:18 PM

## 2020-10-20 NOTE — Progress Notes (Signed)
     [address]       [place] [address]             [phone]       No events overnight  Vitals:   10/20/20 0403 10/20/20 0722  BP: 101/65 118/73  Pulse:  79  Resp:  20  Temp: 97.6 F (36.4 C) 98.1 F (36.7 C)  SpO2:  95%   Alert NAD Sinus EWOB Wound vac in place with good seal  Minimal CT output  CBC Latest Ref Rng & Units 10/19/2020 10/18/2020 10/17/2020  WBC 4.0 - 10.5 K/uL 11.5(H) 13.7(H) 15.0(H)  Hemoglobin 13.0 - 17.0 g/dL 8.1(L) 7.8(L) 9.0(L)  Hematocrit 39.0 - 52.0 % 25.4(L) 24.2(L) 27.6(L)  Platelets 150 - 400 K/uL 314 349 436(H)     60 yo male s/p CABG, POD 1 s/p wound exploration Cultures negative Will remove CT Continue wound vac therapy  Matthew Savage Matthew Savage

## 2020-10-21 LAB — AEROBIC CULTURE W GRAM STAIN (SUPERFICIAL SPECIMEN)

## 2020-10-21 MED ORDER — ZOLPIDEM TARTRATE 5 MG PO TABS
5.0000 mg | ORAL_TABLET | Freq: Every evening | ORAL | Status: DC | PRN
  Administered 2020-11-01 – 2020-11-04 (×4): 5 mg via ORAL
  Filled 2020-10-21 (×5): qty 1

## 2020-10-21 MED ORDER — FLUTICASONE PROPIONATE 50 MCG/ACT NA SUSP
1.0000 | Freq: Every day | NASAL | Status: DC | PRN
  Filled 2020-10-21: qty 16

## 2020-10-21 MED ORDER — TRAMADOL HCL 50 MG PO TABS
50.0000 mg | ORAL_TABLET | Freq: Every day | ORAL | Status: AC
  Administered 2020-10-21 – 2020-10-27 (×7): 50 mg via ORAL
  Filled 2020-10-21 (×7): qty 1

## 2020-10-21 MED ORDER — SODIUM CHLORIDE 0.9 % IV SOLN
2.0000 g | Freq: Three times a day (TID) | INTRAVENOUS | Status: DC
  Administered 2020-10-21 – 2020-10-24 (×12): 2 g via INTRAVENOUS
  Filled 2020-10-21 (×12): qty 2

## 2020-10-21 MED ORDER — SODIUM CHLORIDE 0.9 % IV SOLN
2.0000 g | INTRAVENOUS | Status: DC
  Filled 2020-10-21: qty 20

## 2020-10-21 NOTE — Progress Notes (Addendum)
ID chart check- He's afebrile and WBC has trended towards normal.  I'll leave his cefepime after discussing with pham.  ID tem will return tomorrow.

## 2020-10-21 NOTE — Progress Notes (Signed)
Patient has home CPAP and places himself on when ready. RT will monitor as needed.

## 2020-10-21 NOTE — Progress Notes (Signed)
Progress Note  Patient Name: Matthew Savage Date of Encounter: 10/21/2020  Primary Cardiologist: Werner Lean, MD   Subjective   No events since last cardiology eval last seen 10/18/20 by AHF team.  Notes frustration with still being in the hospital; notes nocturnal pain with [employer] Dressing.  Notes lack of sleep at being in the hospital.  Denies CP, SOB, Palpitations.  Hospital Course: -09/29/2020: admitted with NSTEMI -10/01/2020: cardiac cath demonstrated multivessel CAD -10/02/2020: EF 50-55%, moderate concentric LVH, normal RV size and systolic function, moderate aortic valve stenosis with mean gradient of 29 mmHg -10/03/2020: CABG X 4, endarterectomy of OM1 and PDA, ASD repair, clipping of left atrial appendage -Post-op course complicated by AKI, acute blood loss anemia, ileus (resolved), and post-op atrial flutter - 10/07/2020: EF 45-50%, moderate hypokinesis/dyskinesis left ventricular, mid-apical anterior wall, anteroseptal wall and inferoapical segment, moderate aortic valve stenosis with mean gradient of 26 mmHg  -10/15/2020: Switched IV lasix to torsemide 40 mg po daily. Lower extremity edema predominately due to venous stasis.  -10/17/2020: started on IV amio for atypical flutter/atrial tach   Inpatient Medications    Scheduled Meds:  allopurinol  300 mg Oral Daily   amiodarone  400 mg Oral BID   aspirin EC  81 mg Oral Daily   atorvastatin  80 mg Oral Daily   colchicine  0.3 mg Oral BID   feeding supplement  237 mL Oral BID BM   ferrous [id number] C-folic acid  1 capsule Oral BID PC   isosorbide dinitrate  10 mg Oral TID   metoprolol tartrate  25 mg Oral BID   pantoprazole  40 mg Oral Daily   potassium chloride  20 mEq Oral Daily   rivaroxaban  20 mg Oral Q supper   torsemide  40 mg Oral Daily   Continuous Infusions:  sodium chloride     sodium chloride 20 mL/hr at 10/03/20 1507   ceFEPime (MAXIPIME) IV 2 g (10/21/20 0205)   PRN  Meds: acetaminophen, dextrose, ipratropium-albuterol, metoprolol tartrate, ondansetron (ZOFRAN) IV, oxyCODONE, simethicone, sodium chloride flush, sodium chloride flush, traMADol   Vital Signs    Vitals:   10/20/20 1606 10/20/20 1957 10/21/20 0036 10/21/20 0404  BP: 106/62 122/72 107/74 107/60  Pulse: 67 71 67 66  Resp:  20 15 15   Temp: (!) 97.4 F (36.3 C) 98.1 F (36.7 C) 98.6 F (37 C) 98.7 F (37.1 C)  TempSrc: Oral Oral Oral Oral  SpO2: 98% 99% 100% 99%  Weight:    130.6 kg  Height:        Intake/Output Summary (Last 24 hours) at 10/21/2020 0800 Last data filed at 10/21/2020 0549 Gross per 24 hour  Intake 954 ml  Output 3275 ml  Net -2321 ml   Filed Weights   10/19/20 0315 10/20/20 0403 10/21/20 0404  Weight: 130.5 kg 131.6 kg 130.6 kg    Telemetry    SR low voltage but regular - Personally Reviewed  ECG    No new - Personally Reviewed  Physical Exam   GEN: No acute distress, obese male Neck: No JVD Cardiac: RRR, systolc murmur noted; no rubs, or gallops.  Respiratory: Clear to auscultation bilaterally. GI: Soft, nontender, non-distended  MS: +2 edema above bilateral leg dressing; No deformity. Neuro:  Nonfocal  Psych: Normal affect, depressed mood  Labs    Chemistry Recent Labs  Lab 10/15/20 1129 10/16/20 0034 10/18/20 0047 10/19/20 0024 10/20/20 0232  NA 130*   < > 129* 129*  131*  K 4.4   < > 4.1 4.2 4.4  CL 89*   < > 90* 91* 90*  CO2 26   < > 30 29 30   GLUCOSE 123*   < > 140* 113* 106*  BUN 37*   < > 38* 30* 33*  CREATININE 1.65*   < > 1.82* 1.53* 1.63*  CALCIUM 8.7*   < > 8.4* 8.4* 8.8*  PROT 6.2*  --   --   --   --   ALBUMIN 3.0*  --   --   --   --   AST 28  --   --   --   --   ALT 26  --   --   --   --   ALKPHOS 87  --   --   --   --   BILITOT 0.4  --   --   --   --   GFRNONAA 47*   < > 42* 52* 48*  ANIONGAP 15   < > 9 9 11    < > = values in this interval not displayed.     Hematology Recent Labs  Lab 10/17/20 0014  10/18/20 0047 10/19/20 0024  WBC 15.0* 13.7* 11.5*  RBC 2.85* 2.47* 2.61*  HGB 9.0* 7.8* 8.1*  HCT 27.6* 24.2* 25.4*  MCV 96.8 98.0 97.3  MCH 31.6 31.6 31.0  MCHC 32.6 32.2 31.9  RDW 14.8 15.4 15.2  PLT 436* 349 314    Cardiac EnzymesNo results for input(s): TROPONINI in the last 168 hours. No results for input(s): TROPIPOC in the last 168 hours.   BNP Recent Labs  Lab 10/15/20 1129  BNP 226.8*     DDimer No results for input(s): DDIMER in the last 168 hours.   Radiology    No results found.  Cardiac Studies  Transthoracic Echocardiogram: Date: 10/07/20 Results:  1. Left ventricular ejection fraction, by estimation, is 45 to 50%. The  left ventricle has mildly decreased function. T   2. Moderate aortic valve stenosis. Aortic valve area, by VTI measures 1.60 cm. Aortic valve mean gradient measures 26.0 mmHg. Aortic valve Vmax measures 3.56 m/s.   NonCardiac CT : Date: 10/17/20 Results: IMPRESSION: 1. Interval median sternotomy and CABG with approximately 37 mL of retrosternal fluid in the superior mediastinum. Abscess is not excluded, but simple density and lack of regional lymphadenopathy suggest that postoperative seroma may be most likely. No suspicious bony changes to the sternum. No soft tissue gas.   2. There are mildly reactive appearing lymph nodes along the surface of the diaphragm. No pericardial effusion.   3. Small new bilateral pleural effusions with dependent and perihilar atelectasis. No pneumothorax.   4. 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  Left/Right Heart Catheterizations: Date: 10/01/20 Results: Left Anterior Descending  Prox LAD lesion is 70% stenosed.  Mid LAD lesion is 80% stenosed.  Left Circumflex  Prox Cx lesion is 80% stenosed.  Mid Cx to Dist Cx lesion is 100% stenosed.  First Obtuse Marginal Branch  1st Mrg-1 lesion is 95% stenosed.  1st Mrg-2 lesion is 100% stenosed.  Second Obtuse Marginal  Branch  Vessel is small in size.  2nd Mrg lesion is 80% stenosed.  Lateral Second Obtuse Marginal Branch  Lat 2nd Mrg lesion is 80% stenosed.  Right Coronary Artery  Ost RCA lesion is 70% stenosed.  Prox RCA to Dist RCA lesion is 100% stenosed.  Right Posterior Atrioventricular Artery  Collaterals  RPAV filled by collaterals from Ost RCA.    Collaterals  RPAV filled by collaterals from Mid Cx.    Patient Profile     60 y.o. male with a history HTN, Morbid Obesity, and AF who presents with NSTEMI and prolonged hospital stay  Assessment & Plan    NSTEMI s/p CABG  (LIMA to LAD, sequential SVG to OM1 and OM2, RIMA to PDA), coronary artery endarterectomies to OM2 and PDA PAF s/p 2021 Ablation and s/p ASD repair at 6/16 CABG S/p LAA clipping Morbid Obesity HTN and OSA Hx of DVT AKI (recovering) - continue ASA and Xarelto; unclear DVT; in the future may be able to transition to just ASA if no need for prolonged AC for DVT and LAA Clip is confirmed by TEE - continue metoprolol tartrate 25 mg PO BID - continue isordil 10 mg TID -10/27/20 at current amiodarone should be near loading; would then transition to 200 mg PO Daily; will need outpatient TFT, LFTs, and PFTs - in balance of AKI and primarily venous stasis issues; wil continue torsemide 40 mg PO Daily - continue atorvastatin 80; LDL < 70 - will need follow up outpatient echo; possible Entresto restart - will attempt to schedule tramdol X1 at night for leg pain related to [employer] that prevents him from sleep  Patient plans [place] with his brother and plans to establish with Dr. [facility] ([address] [phone])  Patient will need DC summary sent to this group at DC  For questions or updates, please contact [place] Please consult [email or web address] for contact info under Cardiology/STEMI.      Signed, Werner Lean, MD  10/21/2020, 8:00 AM

## 2020-10-21 NOTE — Progress Notes (Signed)
Pharmacy Antibiotic Note  Matthew Savage is a 60 y.o. male admitted on 09/29/2020 with concern for sternal wound infection. Now with serratia marcescens in 2/2 blood cultures. 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   WBC down to 11.5 and patient afebrile. CT of chest  showed a retrosternal fluid collection. Scr 1.63 with CrCl ~ 70 ml/min.   Plan: Continue Cefepime 2 gm IV Q 8 hours Will f/u with ID plans for duration of therapy. Monitor renal function closely for need for dose reduction   Height: 6\' 3"  (190.5 cm) Weight: 130.6 kg (287 lb 14.7 oz) IBW/kg (Calculated) : 84.5  Temp (24hrs), Avg:98.1 F (36.7 C), Min:97.4 F (36.3 C), Max:98.7 F (37.1 C)  Recent Labs  Lab 10/15/20 0116 10/15/20 1129 10/16/20 0034 10/17/20 0014 10/17/20 0851 10/18/20 0047 10/19/20 0024 10/20/20 0232  WBC 14.0*  --  11.4* 15.0*  --  13.7* 11.5*  --   CREATININE 1.68*   < > 1.63* 1.71*  --  1.82* 1.53* 1.63*  LATICACIDVEN  --   --   --   --  1.4  --   --   --    < > = values in this interval not displayed.     Estimated Creatinine Clearance: 70.1 mL/min (A) (by C-G formula based on SCr of 1.63 mg/dL (H)).    Allergies  Allergen Reactions   Levaquin [Levofloxacin]     Aortic Aneurysm    Antimicrobials this admission: Vancomycin 6/30 >> 7/1 Cefepime 6/30 >>   Dose adjustments this admission: N/A  Microbiology results: 6/30 BCx: 2/2 serratia  6/13 COVID PCR: neg  Thank you for allowing pharmacy to be a part of this patient's care.  [place] Crane, Roylene Reason, BCCP Clinical Pharmacist  10/21/2020 2:47 PM   [facility] pharmacy phone numbers are listed on [email or web address]

## 2020-10-21 NOTE — Plan of Care (Signed)

## 2020-10-21 NOTE — Progress Notes (Addendum)
      [address]       [place] [address]             [phone]      3 Days Post-Op Procedure(s) (LRB): STERNAL WOUND DEBRIDEMENT (N/A) APPLICATION OF WOUND VAC  Subjective:  Patient didn't sleep last night at all.  He is trying to sleep this morning.  States his UNA boots are uncomfortable but is trying to tolerate them  Objective: Vital signs in last 24 hours: Temp:  [97.4 F (36.3 C)-98.7 F (37.1 C)] 98.7 F (37.1 C) (07/04 0404) Pulse Rate:  [66-78] 66 (07/04 0404) Cardiac Rhythm: Normal sinus rhythm;Sinus tachycardia (07/04 0741) Resp:  [15-20] 15 (07/04 0404) BP: (106-122)/(60-79) 112/77 (07/04 0812) SpO2:  [98 %-100 %] 98 % (07/04 0812) Weight:  [130.6 kg] 130.6 kg (07/04 0404)  Intake/Output from previous day: 07/03 0701 - 07/04 0700 In: 1194 [P.O.:1194] Out: 3818 [Urine:3475; Chest Tube:18] Intake/Output this shift: Total I/O In: 240 [P.O.:240] Out: -   General appearance: alert, cooperative, and no distress Heart: regular rate and rhythm Lungs: clear to auscultation bilaterally Abdomen: soft, non-tender; bowel sounds normal; no masses,  no organomegaly Extremities: Una boots in place, + edema on exam Wound: wound vac in place, radial artery site clean and dry  Lab Results: Recent Labs    10/19/20 0024  WBC 11.5*  HGB 8.1*  HCT 25.4*  PLT 314   BMET:  Recent Labs    10/19/20 0024 10/20/20 0232  NA 129* 131*  K 4.2 4.4  CL 91* 90*  CO2 29 30  GLUCOSE 113* 106*  BUN 30* 33*  CREATININE 1.53* 1.63*  CALCIUM 8.4* 8.8*    PT/INR: No results for input(s): LABPROT, INR in the last 72 hours. ABG    Component Value Date/Time   PHART 7.280 (L) 10/03/2020 2146   HCO3 20.5 10/03/2020 2146   TCO2 22 10/03/2020 2146   ACIDBASEDEF 6.0 (H) 10/03/2020 2146   O2SAT 97.0 10/03/2020 2146   CBG (last 3)  No results for input(s): GLUCAP in the last 72 hours.  Assessment/Plan: S/P Procedure(s) (LRB): STERNAL WOUND DEBRIDEMENT  (N/A) APPLICATION OF WOUND VAC  CV- NSR- on Amiodarone 400 mg BID, Isordil for radial, on Xarelto.. may benefit from discontinuation of Isordil and resume home Entresto Pulm- no acute issues, continue IS Renal- creatinine has been stable, will repeat BMET in AM, remains on Torsemide at 40 mg daily, Una boots in place I/D- wound vac on sternum, plan for change tomorrow with Dr. Orvan Seen.. OR cultures did show serratia, continue IV ABX, ID on board will need to finalized length of therapy, remains afebrile Hyponatremia- improving Activity- continue ambulation as tolerated   LOS: 21 days   Ellwood Handler, PA-C 10/21/2020   Agree with above. Wound VAC change tomorrow. Continue antibiotics.  Yul Diana Bary Leriche

## 2020-10-22 ENCOUNTER — Ambulatory Visit: Payer: BC Managed Care – PPO | Admitting: Cardiothoracic Surgery

## 2020-10-22 ENCOUNTER — Ambulatory Visit: Payer: BC Managed Care – PPO | Admitting: Internal Medicine

## 2020-10-22 DIAGNOSIS — I249 Acute ischemic heart disease, unspecified: Secondary | ICD-10-CM | POA: Diagnosis not present

## 2020-10-22 LAB — BASIC METABOLIC PANEL
Anion gap: 9 (ref 5–15)
BUN: 37 mg/dL — ABNORMAL HIGH (ref 6–20)
CO2: 29 mmol/L (ref 22–32)
Calcium: 8.8 mg/dL — ABNORMAL LOW (ref 8.9–10.3)
Chloride: 95 mmol/L — ABNORMAL LOW (ref 98–111)
Creatinine, Ser: 1.62 mg/dL — ABNORMAL HIGH (ref 0.61–1.24)
GFR, Estimated: 48 mL/min — ABNORMAL LOW (ref >60)
Glucose, Bld: 123 mg/dL — ABNORMAL HIGH (ref 70–99)
Potassium: 4.3 mmol/L (ref 3.5–5.1)
Sodium: 133 mmol/L — ABNORMAL LOW (ref 135–145)

## 2020-10-22 MED ORDER — GUAIFENESIN ER 600 MG PO TB12
1200.0000 mg | ORAL_TABLET | Freq: Two times a day (BID) | ORAL | Status: DC
  Administered 2020-10-22 – 2020-11-05 (×28): 1200 mg via ORAL
  Filled 2020-10-22 (×28): qty 2

## 2020-10-22 NOTE — Progress Notes (Signed)
Oxy 2 tabs given prior to wound vac dressing change.

## 2020-10-22 NOTE — Progress Notes (Signed)
Pt sts he has ambulated x2 today. Has been walking independently pushing IV pole. Declines ambulating now due to recently taken pain meds and going to bed for nap. Answered pts questions.  [place], ACSM 1:22 PM 10/22/2020

## 2020-10-22 NOTE — Progress Notes (Signed)
Occupational Therapy Treatment and Discharge  Patient Details Name: Matthew Savage MRN: [id number] DOB: [birth year] Today's Date: 10/22/2020    History of present illness Pt is a 60 y/o male presenting on 6/16 for CABG with TEE, and ICG with diagnosis of CAD. Pt extubated 6/16. Also, on 10/13/20 noted in MD notes pt with volume overload/chronic diastolic HF with lasix initiated to diurese.  Found with Sternal wound infection 6/30, s/p sternal wound debridement 7/1. PMH includes: hematuria, class II obesity, DDD, chronic DVT, gout, heart murmur, hyperlipidemia, hypertension, lap band surgery, numbness and tingling, SCC of face, mild AVS, TAA, atrial fibrillation/typical atrial flutter with ablation, CAD multiple MIs.   OT comments  Patient seated EOB and agreeable to OT.  Pt feeling better since sternal wound debridement.  Reviewed sternal precautions and pt demonstrating ability to adhere to precautions during ADLs (toileting and dressing) without cueing.  Completing transfers with modified independence. Reviewed compensatory techniques for LB dressing due to continued edema, pt reports plan to have his brother assist at dc or wear slip on shoes. Based on performance today, pt has met all OT goals and has not further acute OT needs. OT will sign off.    Follow Up Recommendations  No OT follow up;Supervision - Intermittent    Equipment Recommendations  None recommended by OT    Recommendations for Other Services      Precautions / Restrictions Precautions Precautions: Sternal Precaution Comments: reviewed precautions, good recall and adherence Restrictions Weight Bearing Restrictions: Yes RUE Weight Bearing: Partial weight bearing LUE Weight Bearing: Partial weight bearing Other Position/Activity Restrictions: sternal precautions       Mobility Bed Mobility               General bed mobility comments: EOB upon entry    Transfers Overall transfer level: Modified independent                     Balance Overall balance assessment: No apparent balance deficits (not formally assessed)                                         ADL either performed or assessed with clinical judgement   ADL Overall ADL's : Needs assistance/impaired                 Upper Body Dressing : Modified independent;Sitting Upper Body Dressing Details (indicate cue type and reason): reviewed technique, simulated with no assist required Lower Body Dressing: Supervision/safety;Sit to/from stand Lower Body Dressing Details (indicate cue type and reason): reviewed technique, plans to wear slip on shoes or have brother assist Toilet Transfer: Ambulation;Modified Independent   Toileting- Clothing Manipulation and Hygiene: Modified independent;Sit to/from stand Toileting - Clothing Manipulation Details (indicate cue type and reason): simulated in room, pt demonstrating technique while adhering to precautions     Functional mobility during ADLs: Modified independent General ADL Comments: pt limited by sternal precautions and LB edema     Vision       Perception     Praxis      Cognition Arousal/Alertness: Awake/alert Behavior During Therapy: WFL for tasks assessed/performed Overall Cognitive Status: Within Functional Limits for tasks assessed  Exercises     Shoulder Instructions       General Comments discussed dc plan, ADL compensatory techniques due to sternal precautions and safety; no question or concerns at this time    Pertinent Vitals/ Pain       Pain Assessment: No/denies pain  Home Living                                          Prior Functioning/Environment              Frequency  Min 2X/week        Progress Toward Goals  OT Goals(current goals can now be found in the care plan section)  Progress towards OT goals: Goals met/education completed,  patient discharged from OT  Acute Rehab OT Goals Patient Stated Goal: to get out of here OT Goal Formulation: With patient  Plan All goals met and education completed, patient discharged from OT services;Discharge plan needs to be updated    Co-evaluation                 AM-PAC OT "6 Clicks" Daily Activity     Outcome Measure   Help from another person eating meals?: None Help from another person taking care of personal grooming?: None Help from another person toileting, which includes using toliet, bedpan, or urinal?: None Help from another person bathing (including washing, rinsing, drying)?: A Little Help from another person to put on and taking off regular upper body clothing?: None Help from another person to put on and taking off regular lower body clothing?: A Little 6 Click Score: 22    End of Session    OT Visit Diagnosis: Other abnormalities of gait and mobility (R26.89);Muscle weakness (generalized) (M62.81)   Activity Tolerance Patient tolerated treatment well   Patient Left Other (comment);with call bell/phone within reach (seated EOB)   Nurse Communication Mobility status        Time: 8616-1224 OT Time Calculation (min): 16 min  Charges: OT General Charges $OT Visit: 1 Visit OT Treatments $Self Care/Home Management : 8-22 mins  Jolaine Artist, OT Acute Rehabilitation Services Pager [phone] Office [phone]    [name] 10/22/2020, 10:26 AM

## 2020-10-22 NOTE — Progress Notes (Signed)
4 Days Post-Op Procedure(s) (LRB): STERNAL WOUND DEBRIDEMENT (N/A) APPLICATION OF WOUND VAC Subjective: No complaints  Objective: Vital signs in last 24 hours: Temp:  [97.6 F (36.4 C)-98.3 F (36.8 C)] 98.3 F (36.8 C) (07/05 1147) Pulse Rate:  [67-77] 74 (07/05 1147) Cardiac Rhythm: Heart block (07/05 1147) Resp:  [14-16] 15 (07/05 1147) BP: (99-120)/(63-73) 108/63 (07/05 0935) SpO2:  [96 %-100 %] 98 % (07/05 1147) Weight:  [127.9 kg] 127.9 kg (07/05 0525)  Hemodynamic parameters for last 24 hours:    Intake/Output from previous day: 07/04 0701 - 07/05 0700 In: 440 [P.O.:240; IV Piggyback:200] Out: 2075 [Urine:2075] Intake/Output this shift: Total I/O In: 237 [P.O.:237] Out: -   General appearance: alert and cooperative Neurologic: intact Heart: regular rate and rhythm, S1, S2 normal, no murmur, click, rub or gallop Extremities: Unna boots bilaterally Wound: wound vac foam changed at bedside. Beginning to granulate  Lab Results: No results for input(s): WBC, HGB, HCT, PLT in the last 72 hours. BMET:  Recent Labs    10/20/20 0232 10/22/20 0038  NA 131* 133*  K 4.4 4.3  CL 90* 95*  CO2 30 29  GLUCOSE 106* 123*  BUN 33* 37*  CREATININE 1.63* 1.62*  CALCIUM 8.8* 8.8*    PT/INR: No results for input(s): LABPROT, INR in the last 72 hours. ABG    Component Value Date/Time   PHART 7.280 (L) 10/03/2020 2146   HCO3 20.5 10/03/2020 2146   TCO2 22 10/03/2020 2146   ACIDBASEDEF 6.0 (H) 10/03/2020 2146   O2SAT 97.0 10/03/2020 2146   CBG (last 3)  No results for input(s): GLUCAP in the last 72 hours.  Assessment/Plan: S/P Procedure(s) (LRB): STERNAL WOUND DEBRIDEMENT (N/A) APPLICATION OF WOUND VAC Could be discharged to SNF or HH; we can change vac in office 2-3x per week. Do not anticipate additional surgery   LOS: 22 days    Wonda Olds 10/22/2020

## 2020-10-22 NOTE — Progress Notes (Signed)
      [address]       [place] [address]             [phone]        14 Days Post-Op Procedure(s) (LRB): STERNAL WOUND DEBRIDEMENT (N/A) APPLICATION OF WOUND VAC  Subjective: Patient very hungry this am. He has already eaten one bowel of cereal and is about to eat a second.  Objective: Vital signs in last 24 hours: Temp:  [97.6 F (36.4 C)-98 F (36.7 C)] 98 F (36.7 C) (07/05 0313) Pulse Rate:  [67-77] 68 (07/05 0313) Cardiac Rhythm: Normal sinus rhythm (07/05 0400) Resp:  [15-16] 15 (07/05 0313) BP: (99-122)/(64-77) 109/73 (07/05 0313) SpO2:  [96 %-100 %] 100 % (07/05 0313) Weight:  [127.9 kg] 127.9 kg (07/05 0525)  Pre op weight 131.6 kg Current Weight  10/22/20 127.9 kg      Intake/Output from previous day: 07/04 0701 - 07/05 0700 In: 440 [P.O.:240; IV Piggyback:200] Out: 2075 [Urine:2075]   Physical Exam:  Cardiovascular: RRR, murmur heard best along sternal border Pulmonary: Mostly clear Abdomen: Soft, non tender, bowel sounds present. Extremities: Both legs wrapped this am Wounds: Sternal and right LE wounds are clean and dry.  VAC in place on lower sternal wound and VAC is functioning.  Lab Results: CBC: No results for input(s): WBC, HGB, HCT, PLT in the last 72 hours.  BMET:  Recent Labs    10/20/20 0232 10/22/20 0038  NA 131* 133*  K 4.4 4.3  CL 90* 95*  CO2 30 29  GLUCOSE 106* 123*  BUN 33* 37*  CREATININE 1.63* 1.62*  CALCIUM 8.8* 8.8*     PT/INR:  Lab Results  Component Value Date   INR 1.3 (H) 10/03/2020   INR 1.0 10/03/2020   ABG:  INR: Will add last result for INR, ABG once components are confirmed Will add last 4 CBG results once components are confirmed  Assessment/Plan:  1. CV - A flutter;history of a fib/fl. Had LA clip placed at the time of surgery.  Appears SR, first degree heart block this am.On Amiodarone 400 mg bid,  Isordil 10 mg tid, Lopressor 25 mg bid, and Rivaroxaban 20 mg at supper.   2.  Pulmonary - On room air. History of OSA-on CPAP at night. Encourage incentive spirometer 3. Acute on chronic heart failure- On Torsemide 40 mg daily. Appreciate advanced heart failure's assistance. Unna boots. 4.  Expected post op acute blood loss anemia - Last H and H stable at 8.1 and 25.4. Continue Trinsicon 6. AKI- Creatinine this am remains 1.63. Creatinine upon admission 1.22 .Re check in am 7. Hyponatremia-has had post op. Sodium 129. Possibly related to diuresis 8. Last WBC decreased from 13,700 to 11,500. Blood culture shows Serratia Marcescens.  9. On Cefepime. Appreciate infectious disease assistance 10. Will discuss with Dr. Orvan Seen, if changing VAC at bedside this am.  Sharalyn Ink [facility] 10/22/2020,7:33 AM

## 2020-10-22 NOTE — Progress Notes (Signed)
[facility] Infectious Diseases Follow Up Note  Patient Identification: Patient Name: Matthew Savage MRN: [id number] [place] Date: 09/29/2020 10:38 PM Age: 60 y.o.Today's Date: 10/22/2020   Reason for Visit: Sternal wound infection  Principal Problem:   Acute coronary syndrome [facility]) Active Problems:   Hyperlipidemia   Gout   Essential hypertension   OSA (obstructive sleep apnea)   Chronic deep vein thrombosis (DVT) of distal vein of left lower extremity (HCC)   Typical atrial flutter (HCC)   Class 2 obesity   NSTEMI (non-ST elevated myocardial infarction) ([place])   Coronary artery disease   Acute on chronic combined systolic and diastolic CHF (congestive heart failure) ([place])   Sepsis without acute organ dysfunction (HCC)   Serratia septicemia (HCC)   Antibiotics: Cefepime 6/30-current  Lines/Tubes: Wound VAC admit chest, PIV's  Interval Events: Continues to remain afebrile, no leukocytosis, hemodynamically stable.    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   Recommendations -Continue cefepime as is -Repeat blood cultures today for clearance anticipating he will need a PICC line placement for long term IV antibiotics -He tells me he is moving to [place] to stay with his brother after hospital discharge, in which case, he will need a follow up with PCP/ID over there for management of his antibiotics  -Given that serratia is susceptible to PO Bactrim and Ciprofloxacin, he may not need the full 6 weeks course with IV cefepime and can be transitioned to PO  Bactrim or Fluoroquinolones after first 2-3 weeks of  IV cefepime. However, if there are issues of Cr with Bactrim ( CKD) and QTc with Ciprofloxacin ( On amiodarone) he can finish the entire 6 weeks course with IV cefepime   Rest of the management as per the primary team. Thank you for the consult. Please page with pertinent questions or concerns.  ______________________________________________________________________ Subjective patient seen and examined at the bedside. Lying at bed, Feels well. No complaints. Denies nausea/vomiting and diarrhea    Vitals BP 109/73 (BP Location: Right Arm)   Pulse 70   Temp 98.1 F (36.7 C) (Oral)   Resp 14   Ht 6\' 3"  (1.905 m)   Wt 127.9 kg   SpO2 98%   BMI 35.24 kg/m     Physical Exam Constitutional:  obese man lying in bed, appears comfortable    Comments:   Cardiovascular:     Rate and Rhythm: Normal rate and regular rhythm.     Heart sounds: systolic murmur  Pulmonary:     Effort: Pulmonary effort is normal.     Comments:   Abdominal:     Palpations: Abdomen is soft.     Tenderness: non tender and non distended   Musculoskeletal:        General: No swelling or tenderness.   Skin:    Comments: Sternal wound is healing well   Neurological:     General: No focal deficit present.   Psychiatric:        Mood and Affect: Mood normal.   Pertinent Microbiology Results for orders placed or performed during the hospital encounter of 09/29/20  Resp Panel by RT-PCR (Flu A&B, Covid) Nasopharyngeal Swab  Status: None   Collection Time: 09/30/20  2:27 AM   Specimen: Nasopharyngeal Swab; Nasopharyngeal(NP) swabs in vial transport medium  Result Value Ref Range Status   SARS Coronavirus 2 by RT PCR NEGATIVE NEGATIVE Final    Comment: (NOTE) SARS-CoV-2 target nucleic acids are NOT DETECTED.  The SARS-CoV-2 RNA is generally detectable in upper respiratory specimens during the acute phase of infection. The lowest concentration of  SARS-CoV-2 viral copies this assay can detect is 138 copies/mL. A negative result does not preclude SARS-Cov-2 infection and should not be used as the sole basis for treatment or other patient management decisions. A negative result may occur with  improper specimen collection/handling, submission of specimen other than nasopharyngeal swab, presence of viral mutation(s) within the areas targeted by this assay, and inadequate number of viral copies(<138 copies/mL). A negative result must be combined with clinical observations, patient history, and epidemiological information. The expected result is Negative.  Fact Sheet for Patients:  EntrepreneurPulse.com.au  Fact Sheet for Healthcare Providers:  IncredibleEmployment.be  This test is no t yet approved or cleared by the [place] FDA and  has been authorized for detection and/or diagnosis of SARS-CoV-2 by FDA under an Emergency Use Authorization (EUA). This EUA will remain  in effect (meaning this test can be used) for the duration of the COVID-19 declaration under Section 564(b)(1) of the Act, 21 U.S.C.section 360bbb-3(b)(1), unless the authorization is terminated  or revoked sooner.       Influenza A by PCR NEGATIVE NEGATIVE Final   Influenza B by PCR NEGATIVE NEGATIVE Final    Comment: (NOTE) The Xpert Xpress SARS-CoV-2/FLU/RSV plus assay is intended as an aid in the diagnosis of influenza from Nasopharyngeal swab specimens and should not be used as a sole basis for treatment. Nasal washings and aspirates are unacceptable for Xpert Xpress SARS-CoV-2/FLU/RSV testing.  Fact Sheet for Patients: EntrepreneurPulse.com.au  Fact Sheet for Healthcare Providers: IncredibleEmployment.be  This test is not yet approved or cleared by the [place] FDA and has been authorized for detection and/or diagnosis of SARS-CoV-2 by FDA under an Emergency Use  Authorization (EUA). This EUA will remain in effect (meaning this test can be used) for the duration of the COVID-19 declaration under Section 564(b)(1) of the Act, 21 U.S.C. section 360bbb-3(b)(1), unless the authorization is terminated or revoked.  Performed at [facility], [address] [place], [place] [address]   Culture, blood (routine x 2)     Status: Abnormal   Collection Time: 10/17/20  2:30 AM   Specimen: BLOOD LEFT HAND  Result Value Ref Range Status   Specimen Description BLOOD LEFT HAND  Final   Special Requests   Final    BOTTLES DRAWN AEROBIC AND ANAEROBIC Blood Culture adequate volume   Culture  Setup Time   Final    IN BOTH AEROBIC AND ANAEROBIC BOTTLES GRAM NEGATIVE RODS CRITICAL VALUE NOTED.  VALUE IS CONSISTENT WITH PREVIOUSLY REPORTED AND CALLED VALUE.    Culture (A)  Final    SERRATIA MARCESCENS SUSCEPTIBILITIES PERFORMED ON PREVIOUS CULTURE WITHIN THE LAST 5 DAYS. Performed at [facility], [place] [address], [place], [place] [address]    Report Status 10/19/2020 FINAL  Final  Culture, blood (routine x 2)     Status: Abnormal   Collection Time: 10/17/20  2:30 AM   Specimen: BLOOD  Result Value Ref Range Status   Specimen Description BLOOD RIGHT ANTECUBITAL  Final   Special Requests   Final    BOTTLES DRAWN  AEROBIC AND ANAEROBIC Blood Culture adequate volume   Culture  Setup Time   Final    AEROBIC BOTTLE ONLY GRAM NEGATIVE RODS CRITICAL RESULT CALLED TO, READ BACK BY AND VERIFIED WITH: Denton Brick [facility] 10/18/20 0208 JDW Performed at [facility], [address], [place], [place] [address]    Culture SERRATIA MARCESCENS (A)  Final   Report Status 10/19/2020 FINAL  Final   Organism ID, Bacteria SERRATIA MARCESCENS  Final      Susceptibility   Serratia marcescens - MIC*    CEFAZOLIN >=64 RESISTANT Resistant     CEFEPIME <=0.12 SENSITIVE Sensitive     CEFTAZIDIME <=1 SENSITIVE Sensitive     CEFTRIAXONE <=0.25 SENSITIVE Sensitive      CIPROFLOXACIN <=0.25 SENSITIVE Sensitive     GENTAMICIN <=1 SENSITIVE Sensitive     TRIMETH/SULFA <=20 SENSITIVE Sensitive     * SERRATIA MARCESCENS  Blood Culture ID Panel (Reflexed)     Status: Abnormal   Collection Time: 10/17/20  2:30 AM  Result Value Ref Range Status   Enterococcus faecalis NOT DETECTED NOT DETECTED Final   Enterococcus Faecium NOT DETECTED NOT DETECTED Final   Listeria monocytogenes NOT DETECTED NOT DETECTED Final   Staphylococcus species NOT DETECTED NOT DETECTED Final   Staphylococcus aureus (BCID) NOT DETECTED NOT DETECTED Final   Staphylococcus epidermidis NOT DETECTED NOT DETECTED Final   Staphylococcus lugdunensis NOT DETECTED NOT DETECTED Final   Streptococcus species NOT DETECTED NOT DETECTED Final   Streptococcus agalactiae NOT DETECTED NOT DETECTED Final   Streptococcus pneumoniae NOT DETECTED NOT DETECTED Final   Streptococcus pyogenes NOT DETECTED NOT DETECTED Final   A.calcoaceticus-baumannii NOT DETECTED NOT DETECTED Final   Bacteroides fragilis NOT DETECTED NOT DETECTED Final   Enterobacterales DETECTED (A) NOT DETECTED Final    Comment: Enterobacterales represent a large order of gram negative bacteria, not a single organism. CRITICAL RESULT CALLED TO, READ BACK BY AND VERIFIED WITH: G ABBOTT PHARMD 10/18/20 0208 JDW    Enterobacter cloacae complex NOT DETECTED NOT DETECTED Final   Escherichia coli NOT DETECTED NOT DETECTED Final   Klebsiella aerogenes NOT DETECTED NOT DETECTED Final   Klebsiella oxytoca NOT DETECTED NOT DETECTED Final   Klebsiella pneumoniae NOT DETECTED NOT DETECTED Final   Proteus species NOT DETECTED NOT DETECTED Final   Salmonella species NOT DETECTED NOT DETECTED Final   Serratia marcescens DETECTED (A) NOT DETECTED Final    Comment: CRITICAL RESULT CALLED TO, READ BACK BY AND VERIFIED WITH: G ABBOTT PHARMD 10/18/20 0208 JDW    Haemophilus influenzae NOT DETECTED NOT DETECTED Final   Neisseria meningitidis NOT DETECTED NOT  DETECTED Final   Pseudomonas aeruginosa NOT DETECTED NOT DETECTED Final   Stenotrophomonas maltophilia NOT DETECTED NOT DETECTED Final   Candida albicans NOT DETECTED NOT DETECTED Final   Candida auris NOT DETECTED NOT DETECTED Final   Candida glabrata NOT DETECTED NOT DETECTED Final   Candida krusei NOT DETECTED NOT DETECTED Final   Candida parapsilosis NOT DETECTED NOT DETECTED Final   Candida tropicalis NOT DETECTED NOT DETECTED Final   Cryptococcus neoformans/gattii NOT DETECTED NOT DETECTED Final   CTX-M ESBL NOT DETECTED NOT DETECTED Final   Carbapenem resistance IMP NOT DETECTED NOT DETECTED Final   Carbapenem resistance KPC NOT DETECTED NOT DETECTED Final   Carbapenem resistance NDM NOT DETECTED NOT DETECTED Final   Carbapenem resist OXA 48 LIKE NOT DETECTED NOT DETECTED Final   Carbapenem resistance VIM NOT DETECTED NOT DETECTED Final    Comment: Performed  at [facility], [place] [address] [place], [place] [address]  Surgical pcr screen     Status: None   Collection Time: 10/18/20  2:44 PM   Specimen: Nasal Mucosa; Nasal Swab  Result Value Ref Range Status   MRSA, PCR NEGATIVE NEGATIVE Final   Staphylococcus aureus NEGATIVE NEGATIVE Final    Comment: (NOTE) The Xpert SA Assay (FDA approved for NASAL specimens in patients 93 years of age and older), is one component of a comprehensive surveillance program. It is not intended to diagnose infection nor to guide or monitor treatment. Performed at [facility], [place] [address] [place], [place] [address]   Aerobic/Anaerobic Culture w Gram Stain (surgical/deep wound)     Status: None (Preliminary result)   Collection Time: 10/18/20  4:49 PM   Specimen: PATH Cytology Misc. fluid; Body Fluid  Result Value Ref Range Status   Specimen Description FLUID SAMPLE 3  Final   Special Requests MEDIASTINAL  Final   Gram Stain   Final    MODERATE WBC PRESENT, PREDOMINANTLY MONONUCLEAR NO ORGANISMS SEEN    Culture   Final     NO GROWTH 2 DAYS NO ANAEROBES ISOLATED; CULTURE IN PROGRESS FOR 5 DAYS Performed at [facility], [address], [place], [place] [address]    Report Status PENDING  Incomplete  Aerobic/Anaerobic Culture w Gram Stain (surgical/deep wound)     Status: None (Preliminary result)   Collection Time: 10/18/20  4:51 PM   Specimen: PATH Cytology Misc. fluid; Body Fluid  Result Value Ref Range Status   Specimen Description FLUID  SAMPLE 2  Final   Special Requests MEDIASTINAL  Final   Gram Stain   Final    FEW WBC PRESENT, PREDOMINANTLY PMN NO ORGANISMS SEEN Performed at [facility], [address] [place], [place] [address]    Culture   Final    RARE SERRATIA MARCESCENS NO ANAEROBES ISOLATED; CULTURE IN PROGRESS FOR 5 DAYS    Report Status PENDING  Incomplete   Organism ID, Bacteria SERRATIA MARCESCENS  Final      Susceptibility   Serratia marcescens - MIC*    CEFAZOLIN >=64 RESISTANT Resistant     CEFEPIME <=0.12 SENSITIVE Sensitive     CEFTAZIDIME <=1 SENSITIVE Sensitive     CEFTRIAXONE <=0.25 SENSITIVE Sensitive     CIPROFLOXACIN <=0.25 SENSITIVE Sensitive     GENTAMICIN <=1 SENSITIVE Sensitive     TRIMETH/SULFA <=20 SENSITIVE Sensitive     * RARE SERRATIA MARCESCENS  Aerobic Culture w Gram Stain (superficial specimen)     Status: None   Collection Time: 10/18/20  4:51 PM   Specimen: Fluid  Result Value Ref Range Status   Specimen Description FLUID  SAMPLE 1  Final   Special Requests MEDIASTINAL  Final   Gram Stain   Final    RARE WBC PRESENT, PREDOMINANTLY PMN NO ORGANISMS SEEN Performed at [facility], [place] [address], [place], [place] [address]    Culture RARE SERRATIA MARCESCENS  Final   Report Status 10/21/2020 FINAL  Final   Organism ID, Bacteria SERRATIA MARCESCENS  Final      Susceptibility   Serratia marcescens - MIC*    CEFAZOLIN >=64 RESISTANT Resistant     CEFEPIME <=0.12 SENSITIVE Sensitive     CEFTAZIDIME <=1 SENSITIVE Sensitive      CEFTRIAXONE <=0.25 SENSITIVE Sensitive     CIPROFLOXACIN <=0.25 SENSITIVE Sensitive     GENTAMICIN <=1 SENSITIVE Sensitive  TRIMETH/SULFA <=20 SENSITIVE Sensitive     * RARE SERRATIA MARCESCENS    Pertinent Lab. CBC Latest Ref Rng & Units 10/19/2020 10/18/2020 10/17/2020  WBC 4.0 - 10.5 K/uL 11.5(H) 13.7(H) 15.0(H)  Hemoglobin 13.0 - 17.0 g/dL 8.1(L) 7.8(L) 9.0(L)  Hematocrit 39.0 - 52.0 % 25.4(L) 24.2(L) 27.6(L)  Platelets 150 - 400 K/uL 314 349 436(H)   CMP Latest Ref Rng & Units 10/22/2020 10/20/2020 10/19/2020  Glucose 70 - 99 mg/dL 123(H) 106(H) 113(H)  BUN 6 - 20 mg/dL 37(H) 33(H) 30(H)  Creatinine 0.61 - 1.24 mg/dL 1.62(H) 1.63(H) 1.53(H)  Sodium 135 - 145 mmol/L 133(L) 131(L) 129(L)  Potassium 3.5 - 5.1 mmol/L 4.3 4.4 4.2  Chloride 98 - 111 mmol/L 95(L) 90(L) 91(L)  CO2 22 - 32 mmol/L 29 30 29   Calcium 8.9 - 10.3 mg/dL 8.8(L) 8.8(L) 8.4(L)  Total Protein 6.5 - 8.1 g/dL - - -  Total Bilirubin 0.3 - 1.2 mg/dL - - -  Alkaline Phos 38 - 126 U/L - - -  AST 15 - 41 U/L - - -  ALT 0 - 44 U/L - - -     Pertinent Imaging today Plain films and CT images have been personally visualized and interpreted; radiology reports have been reviewed. Decision making incorporated into the Impression / Recommendations.  CT chest 10/17/20 IMPRESSION: 1. Interval median sternotomy and CABG with approximately 37 mL of retrosternal fluid in the superior mediastinum. Abscess is not excluded, but simple density and lack of regional lymphadenopathy suggest that postoperative seroma may be most likely. No suspicious bony changes to the sternum. No soft tissue gas.   2. There are mildly reactive appearing lymph nodes along the surface of the diaphragm. No pericardial effusion.   3. Small new bilateral pleural effusions with dependent and perihilar atelectasis. No pneumothorax.   4. 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   TTE 10/07/20 1. Left ventricular ejection  fraction, by estimation, is 45 to 50%. The left ventricle has mildly decreased function. The left ventricle demonstrates regional wall motion abnormalities (see scoring diagram/findings for description). There is moderate left ventricular hypertrophy. There is incoordinate septal motion. There is moderate hypokinesis/dyskinesis of the left ventricular, mid-apical anterior wall, anteroseptal wall and inferoapical segment. 2. The aortic valve has an indeterminant number of cusps. Mild to moderate aortic valve stenosis. Aortic valve area, by VTI measures 1.60 cm. Aortic valve mean gradient measures 26.0 mmHg. Aortic valve Vmax measures 3.56 m/s. Comparison(s): Changes from prior study are noted. 10/02/2020: LVEF 50-55%, mild to moderate aortic valve setnosis - mean gradient 29 mmHg.  I spent more than 35 minutes for this patient encounter including review of prior medical records, coordination of care  with greater than 50% of time being face to face/counseling and discussing diagnostics/treatment plan with the patient/family.  Electronically signed by:   Rosiland Oz, MD Infectious Disease Physician [facility] Pager: [phone]

## 2020-10-22 NOTE — Progress Notes (Signed)
Patient refused CPAP for tonight. Patient's home CPAP is on bedside table. RT instructed patient to have RT called if he decides to wear it and needs assistance. RT will monitor as needed.

## 2020-10-22 NOTE — Progress Notes (Signed)
Nutrition Follow-up  DOCUMENTATION CODES:  Obesity unspecified  INTERVENTION:  Continue Heart Healthy diet.  Continue Ensure Enlive BID.  NUTRITION DIAGNOSIS:  Inadequate oral intake related to acute illness as evidenced by per patient/family report. - ongoing  GOAL:  Patient will meet greater than or equal to 90% of their needs - meeting  MONITOR:  PO intake, Supplement acceptance, Labs, Weight trends  REASON FOR ASSESSMENT:  Consult Assessment of nutrition requirement/status  ASSESSMENT:  60 yo male admitted with severe 3-V CAD requiring CABG. PMH includes HLD, gout, HTN, OSA, CAD, hx of gastric lap band 6/16 - TEE, CABG x 4   Spoke with pt at bedside. Pt with no complaints. He reports that his appetite is much better this week. Per Epic, pt eating much better, 50-100%, 100% for the last 6 of 8 meals.  Admit wt: 134.7 kg Current wt: 127.9 kg Pt's weight fluctuating throughout admission. Pt's weight decreasing more recently.  Continue Ensure Enlive BID.  Medications: reviewed; EE BID, ferrous [id number] C-folic acid, Protonix, Klor-Con 20 mEq, cefepime TID via IV  Labs: reviewed; Na 133 (L), Glucose 123 (H)  Diet Order:   Diet Order             Diet Heart Room service appropriate? Yes; Fluid consistency: Thin  Diet effective now                  EDUCATION NEEDS:  Not appropriate for education at this time  Skin:  Skin Assessment: Skin Integrity Issues: Skin Integrity Issues:: Incisions Incisions: chest, R leg  Last BM:  10/22/20 - Type 6, black, medium  Height:  Ht Readings from Last 1 Encounters:  10/03/20 6\' 3"  (1.905 m)   Weight:  Wt Readings from Last 1 Encounters:  10/22/20 127.9 kg   BMI:  Body mass index is 35.24 kg/m.  Estimated Nutritional Needs:  Kcal:  2200-2400 kcals Protein:  120-135 g Fluid:  >/= 2 L  Derrel Nip, RD, LDN (she/her/hers) Registered Dietitian I After-Hours/Weekend Pager # in [place]

## 2020-10-23 ENCOUNTER — Encounter (HOSPITAL_COMMUNITY): Payer: Self-pay | Admitting: Cardiothoracic Surgery

## 2020-10-23 ENCOUNTER — Inpatient Hospital Stay (HOSPITAL_COMMUNITY): Payer: BC Managed Care – PPO | Admitting: Anesthesiology

## 2020-10-23 ENCOUNTER — Encounter (HOSPITAL_COMMUNITY)
Admission: EM | Disposition: A | Discharge status: Home Health | Payer: Self-pay | Source: Home / Self Care | Attending: Cardiothoracic Surgery

## 2020-10-23 ENCOUNTER — Inpatient Hospital Stay: Payer: Self-pay

## 2020-10-23 DIAGNOSIS — T8132XA Disruption of internal operation (surgical) wound, not elsewhere classified, initial encounter: Secondary | ICD-10-CM

## 2020-10-23 HISTORY — PX: STERNAL WOUND DEBRIDEMENT: SHX1058

## 2020-10-23 LAB — BASIC METABOLIC PANEL
Anion gap: 11 (ref 5–15)
BUN: 35 mg/dL — ABNORMAL HIGH (ref 6–20)
CO2: 28 mmol/L (ref 22–32)
Calcium: 9 mg/dL (ref 8.9–10.3)
Chloride: 92 mmol/L — ABNORMAL LOW (ref 98–111)
Creatinine, Ser: 1.69 mg/dL — ABNORMAL HIGH (ref 0.61–1.24)
GFR, Estimated: 46 mL/min — ABNORMAL LOW (ref >60)
Glucose, Bld: 114 mg/dL — ABNORMAL HIGH (ref 70–99)
Potassium: 4.6 mmol/L (ref 3.5–5.1)
Sodium: 131 mmol/L — ABNORMAL LOW (ref 135–145)

## 2020-10-23 LAB — PREALBUMIN: Prealbumin: 16 mg/dL — ABNORMAL LOW (ref 18–38)

## 2020-10-23 LAB — PATHOLOGIST SMEAR REVIEW

## 2020-10-23 SURGERY — DEBRIDEMENT, WOUND, STERNUM
Anesthesia: Monitor Anesthesia Care

## 2020-10-23 MED ORDER — PROPOFOL 500 MG/50ML IV EMUL
INTRAVENOUS | Status: DC | PRN
  Administered 2020-10-23: 100 ug/kg/min via INTRAVENOUS

## 2020-10-23 MED ORDER — ONDANSETRON HCL 4 MG/2ML IJ SOLN
INTRAMUSCULAR | Status: DC | PRN
  Administered 2020-10-23: 4 mg via INTRAVENOUS

## 2020-10-23 MED ORDER — 0.9 % SODIUM CHLORIDE (POUR BTL) OPTIME
TOPICAL | Status: DC | PRN
  Administered 2020-10-23: 2000 mL

## 2020-10-23 MED ORDER — FENTANYL CITRATE (PF) 250 MCG/5ML IJ SOLN
INTRAMUSCULAR | Status: AC
  Filled 2020-10-23: qty 5

## 2020-10-23 MED ORDER — MIDAZOLAM HCL 2 MG/2ML IJ SOLN
INTRAMUSCULAR | Status: DC | PRN
  Administered 2020-10-23: 2 mg via INTRAVENOUS

## 2020-10-23 MED ORDER — LACTATED RINGERS IV SOLN: INTRAVENOUS | Status: DC

## 2020-10-23 MED ORDER — LIDOCAINE 2% (20 MG/ML) 5 ML SYRINGE
INTRAMUSCULAR | Status: DC | PRN
  Administered 2020-10-23: 60 mg via INTRAVENOUS

## 2020-10-23 MED ORDER — ORAL CARE MOUTH RINSE: 15.0000 mL | Freq: Once | OROMUCOSAL | Status: AC

## 2020-10-23 MED ORDER — CHLORHEXIDINE GLUCONATE 0.12 % MT SOLN: 15.0000 mL | Freq: Once | OROMUCOSAL | Status: AC

## 2020-10-23 MED ORDER — STERILE WATER FOR IRRIGATION IR SOLN
Status: DC | PRN
  Administered 2020-10-23: 1000 mL

## 2020-10-23 MED ORDER — CHLORHEXIDINE GLUCONATE 0.12 % MT SOLN
OROMUCOSAL | Status: AC
  Administered 2020-10-23: 15 mL via OROMUCOSAL
  Filled 2020-10-23: qty 15

## 2020-10-23 MED ORDER — MIDAZOLAM HCL 2 MG/2ML IJ SOLN
INTRAMUSCULAR | Status: AC
  Filled 2020-10-23: qty 2

## 2020-10-23 MED ORDER — SODIUM CHLORIDE 0.9 % IR SOLN
Status: DC | PRN
  Administered 2020-10-23: 1000 mL

## 2020-10-23 MED ORDER — FENTANYL CITRATE (PF) 250 MCG/5ML IJ SOLN
INTRAMUSCULAR | Status: DC | PRN
  Administered 2020-10-23: 50 ug via INTRAVENOUS

## 2020-10-23 SURGICAL SUPPLY — 62 items
APL SKNCLS STERI-STRIP NONHPOA (GAUZE/BANDAGES/DRESSINGS)
BAG DECANTER FOR FLEXI CONT (MISCELLANEOUS) ×1 IMPLANT
BANDAGE ESMARK 6X9 LF (GAUZE/BANDAGES/DRESSINGS) IMPLANT
BENZOIN TINCTURE PRP APPL 2/3 (GAUZE/BANDAGES/DRESSINGS) IMPLANT
BLADE CLIPPER SURG (BLADE) ×1 IMPLANT
BLADE SURG 10 STRL SS (BLADE) ×1 IMPLANT
BNDG CMPR 9X6 STRL LF SNTH (GAUZE/BANDAGES/DRESSINGS)
BNDG ESMARK 6X9 LF (GAUZE/BANDAGES/DRESSINGS)
BNDG GAUZE ELAST 4 BULKY (GAUZE/BANDAGES/DRESSINGS) IMPLANT
CANISTER SUCT 3000ML PPV (MISCELLANEOUS) ×2 IMPLANT
CANISTER WOUND CARE 500ML ATS (WOUND CARE) ×2 IMPLANT
CATH FOLEY 2WAY SLVR  5CC 16FR (CATHETERS)
CATH FOLEY 2WAY SLVR 5CC 16FR (CATHETERS) IMPLANT
CLIP VESOCCLUDE SM WIDE 24/CT (CLIP) IMPLANT
CNTNR URN SCR LID CUP LEK RST (MISCELLANEOUS) IMPLANT
CONT SPEC 4OZ STRL OR WHT (MISCELLANEOUS)
COVER SURGICAL LIGHT HANDLE (MISCELLANEOUS) ×3 IMPLANT
DRAPE CHEST BREAST 15X10 FENES (DRAPES) ×1 IMPLANT
DRAPE INCISE IOBAN 66X45 STRL (DRAPES) IMPLANT
DRAPE LAPAROSCOPIC ABDOMINAL (DRAPES) ×1 IMPLANT
DRAPE WARM FLUID 44X44 (DRAPES) ×1 IMPLANT
DRSG AQUACEL AG ADV 3.5X14 (GAUZE/BANDAGES/DRESSINGS) ×1 IMPLANT
DRSG CUTIMED SORBACT 7X9 (GAUZE/BANDAGES/DRESSINGS) ×1 IMPLANT
DRSG VAC ATS LRG SENSATRAC (GAUZE/BANDAGES/DRESSINGS) IMPLANT
DRSG VAC ATS MED SENSATRAC (GAUZE/BANDAGES/DRESSINGS) IMPLANT
DRSG VAC ATS SM SENSATRAC (GAUZE/BANDAGES/DRESSINGS) ×1 IMPLANT
ELECT BLADE 4.0 EZ CLEAN MEGAD (MISCELLANEOUS) ×2
ELECT REM PT RETURN 9FT ADLT (ELECTROSURGICAL) ×2
ELECTRODE BLDE 4.0 EZ CLN MEGD (MISCELLANEOUS) IMPLANT
ELECTRODE REM PT RTRN 9FT ADLT (ELECTROSURGICAL) ×1 IMPLANT
GAUZE SPONGE 4X4 12PLY STRL (GAUZE/BANDAGES/DRESSINGS) ×2 IMPLANT
GAUZE XEROFORM 5X9 LF (GAUZE/BANDAGES/DRESSINGS) IMPLANT
GLOVE SURG GAMMEX LF SZ7.5 (GLOVE) ×1 IMPLANT
GLOVE SURG MICRO LTX SZ6 (GLOVE) ×1 IMPLANT
GLOVE SURG NEOP MICRO LF SZ7.5 (GLOVE) ×1 IMPLANT
GLOVE SURG UNDER POLY LF SZ6 (GLOVE) ×1 IMPLANT
GOWN STRL REUS W/ TWL LRG LVL3 (GOWN DISPOSABLE) ×3 IMPLANT
GOWN STRL REUS W/TWL LRG LVL3 (GOWN DISPOSABLE) ×6
HANDPIECE INTERPULSE COAX TIP (DISPOSABLE) ×2
HEMOSTAT POWDER SURGIFOAM 1G (HEMOSTASIS) IMPLANT
HEMOSTAT SURGICEL 2X14 (HEMOSTASIS) IMPLANT
KIT BASIN OR (CUSTOM PROCEDURE TRAY) ×2 IMPLANT
KIT TURNOVER KIT B (KITS) ×2 IMPLANT
NS IRRIG 1000ML POUR BTL (IV SOLUTION) ×3 IMPLANT
PACK GENERAL/GYN (CUSTOM PROCEDURE TRAY) ×2 IMPLANT
PAD ARMBOARD 7.5X6 YLW CONV (MISCELLANEOUS) ×4 IMPLANT
POWDER MYRIAD MORCELLS 1000MG (Miscellaneous) ×1 IMPLANT
SET HNDPC FAN SPRY TIP SCT (DISPOSABLE) IMPLANT
SOL PREP POV-IOD 4OZ 10% (MISCELLANEOUS) IMPLANT
SPONGE T-LAP 18X18 ~~LOC~~+RFID (SPONGE) ×2 IMPLANT
STAPLER VISISTAT 35W (STAPLE) IMPLANT
SUT ETHILON 3 0 FSL (SUTURE) IMPLANT
SUT MNCRL AB 3-0 PS2 18 (SUTURE) ×1 IMPLANT
SUT PDS AB 1 CTX 36 (SUTURE) IMPLANT
SUT PROLENE 2 0 MH 48 (SUTURE) IMPLANT
SUT VIC AB 2-0 CTX 27 (SUTURE) ×2 IMPLANT
SWAB COLLECTION DEVICE MRSA (MISCELLANEOUS) IMPLANT
SWAB CULTURE ESWAB REG 1ML (MISCELLANEOUS) IMPLANT
SYR 5ML LL (SYRINGE) IMPLANT
TOWEL GREEN STERILE (TOWEL DISPOSABLE) ×2 IMPLANT
TOWEL GREEN STERILE FF (TOWEL DISPOSABLE) ×2 IMPLANT
WATER STERILE IRR 1000ML POUR (IV SOLUTION) ×3 IMPLANT

## 2020-10-23 NOTE — H&P (Signed)
History and Physical Interval Note:  10/23/2020 3:30 PM  Matthew Savage  has presented today for surgery, with the diagnosis of sternal wound infection.  The various methods of treatment have been discussed with the patient and family. After consideration of risks, benefits and other options for treatment, the patient has consented to  Procedure(s): WOUND VAC CHANGE (N/A) as a surgical intervention.  The patient's history has been reviewed, patient examined, no change in status, stable for surgery.  I have reviewed the patient's chart and labs.  Questions were answered to the patient's satisfaction.     Wonda Olds

## 2020-10-23 NOTE — Progress Notes (Addendum)
[address]       [place] [id number]             [phone]                                 19 Days Post-Op Procedure(s) (LRB): STERNAL WOUND DEBRIDEMENT (N/A) APPLICATION OF WOUND VAC 5 Days Post Op I and D sternal wound, VAC placement     Subjective: Patient states "passing a lot of gas, whole lot of rumbling" this morning.   Objective: Vital signs in last 24 hours: Temp:  [97.8 F (36.6 C)-98.3 F (36.8 C)] 98 F (36.7 C) (07/06 0300) Pulse Rate:  [68-101] 68 (07/06 0300) Cardiac Rhythm: Junctional rhythm (07/05 1909) Resp:  [14-17] 17 (07/06 0300) BP: (99-118)/(62-68) 99/62 (07/06 0300) SpO2:  [94 %-98 %] 96 % (07/06 0300) Weight:  [128.2 kg] 128.2 kg (07/06 0300)   Pre op weight 131.6 kg    Current Weight  10/23/20 128.2 kg      Intake/Output from previous day: 07/05 0701 - 07/06 0700 In: 917 [P.O.:917] Out: 1270 [Urine:1250; Drains:20]     Physical Exam:   Cardiovascular: RRR, murmur heard best along sternal border Pulmonary: Slightly diminished bibasilar breath sounds Abdomen: Soft, non tender, bowel sounds present. Extremities: Bilateral feet edema;(has decreased over last week). Both LEs are wrapped this am. Wounds: Sternal and right LE wounds are clean and dry.  No erythema or signs of infection RLE wound. Lower sternal wound has some erythema superficially but no drainage this am   Lab Results: CBC:  Recent Labs (last 2 labs)   No results for input(s): WBC, HGB, HCT, PLT in the last 72 hours.    BMET:  Recent Labs (last 2 labs)       Recent Labs    10/22/20 0038 10/23/20 0123  NA 133* 131*  K 4.3 4.6  CL 95* 92*  CO2 29 28  GLUCOSE 123* 114*  BUN 37* 35*  CREATININE 1.62* 1.69*  CALCIUM 8.8* 9.0       PT/INR:  Recent Labs       Lab Results  Component Value Date    INR 1.3 (H) 10/03/2020    INR 1.0 10/03/2020      ABG:  INR: Will add last result for INR, ABG once components are confirmed Will add  last 4 CBG results once components are confirmed   Assessment/Plan:   1. CV - A flutter;history of a fib/fl. Had LA clip placed at the time of surgery.  PAF. On Amiodarone 400 mg bid, Isordil 10 mg tid, Lopressor 25 mg bid, and Rivaroxaban 20 mg at supper. 2.  Pulmonary - On room air. History of OSA-on CPAP at night. Encourage incentive spirometer 3. Acute on chronic heart failure- On Torsemide 40 mg daily. Appreciate advanced heart failure's assistance. Ok to place [employer]. 4.  Expected post op acute blood loss anemia - H and H yesterday stable at 8.1 and 25.4. Continue Trinsicon 6. AKI- Creatinine this am slightly increased from 1.62 to 1.69. Creatinine upon admission 1.22 .Re check in am 7. Hyponatremia-has had post op. Sodium 131. Possibly related to diuresis 8. WBC decreased yesterday from 13,700 to 11,500. Blood culture shows Serratia Marcescens. Dr. Orvan Seen changed wound VAC at bedside yesterday 9. On Cefepime. Appreciate infectious disease assistance. Per note, patient will need 6 weeks of treatment (IV Cefepime with possible transition  to oral Bactrim or Fluoroquinolone after 2-3 weeks of IV antibiotic, with careful monitoring of creatinine and QTc). Will place PICC line after latest BC negative     Cira Deyoe M ZimmermanPA-C 10/23/2020,7:12 AM

## 2020-10-23 NOTE — Anesthesia Preprocedure Evaluation (Addendum)
Anesthesia Evaluation  Patient identified by MRN, date of birth, ID band Patient awake    Reviewed: Allergy & Precautions, NPO status , Patient's Chart, lab work & pertinent test results, reviewed documented beta blocker date and time   History of Anesthesia Complications Negative for: history of anesthetic complications  Airway Mallampati: II  TM Distance: >3 FB Neck ROM: Full    Dental  (+) Dental Advisory Given   Pulmonary sleep apnea , COPD,  COPD inhaler, former smoker,    breath sounds clear to auscultation       Cardiovascular hypertension, Pt. on medications and Pt. on home beta blockers (-) angina+ CAD, + CABG, +CHF and + DVT (2017, 2018)  + dysrhythmias Atrial Fibrillation + Valvular Problems/Murmurs (mild-mod) AS  Rhythm:Regular Rate:Normal + Systolic murmurs 9/43/2761 ECHO: EF 45-50%. LV has mildly decreased function, Regional wall motion abnormalities (see scoring  diagram/findings for description), moderate LVH, incoordinate septal motion, moderate hypokinesis/dyskinesis of the left ventricular, mid-apical  anterior wall, anteroseptal wall and inferoapical segment.  Mild-moderate AS, AVA, by VTI measures 1.60 cm with mean gradient 26.0 mmHg   Neuro/Psych DDD with chronic back pain    GI/Hepatic Neg liver ROS, GERD  Controlled,S/p lap band   Endo/Other  Morbid obesity  Renal/GU Renal InsufficiencyRenal disease     Musculoskeletal  (+) Arthritis ,   Abdominal (+) + obese,   Peds  Hematology  (+) Blood dyscrasia (Hb 8.1), anemia ,   Anesthesia Other Findings   Reproductive/Obstetrics                            Anesthesia Physical Anesthesia Plan  ASA: 4  Anesthesia Plan: MAC   Post-op Pain Management:    Induction:   PONV Risk Score and Plan: 1 and Ondansetron and Treatment may vary due to age or medical condition  Airway Management Planned: Natural Airway and Simple  Face Mask  Additional Equipment: None  Intra-op Plan:   Post-operative Plan:   Informed Consent: I have reviewed the patients History and Physical, chart, labs and discussed the procedure including the risks, benefits and alternatives for the proposed anesthesia with the patient or authorized representative who has indicated his/her understanding and acceptance.     Dental advisory given  Plan Discussed with: CRNA and Surgeon  Anesthesia Plan Comments:        Anesthesia Quick Evaluation

## 2020-10-23 NOTE — Anesthesia Procedure Notes (Signed)
Procedure Name: MAC Date/Time: 10/23/2020 3:40 PM Performed by: Babs Bertin, CRNA Pre-anesthesia Checklist: Patient identified, Emergency Drugs available, Suction available, Patient being monitored and Timeout performed Patient Re-evaluated:Patient Re-evaluated prior to induction Oxygen Delivery Method: Simple face mask

## 2020-10-23 NOTE — Brief Op Note (Signed)
10/23/2020  4:36 PM  PATIENT:  Matthew Savage  60 y.o. male  PRE-OPERATIVE DIAGNOSIS:  sternal wound infection, superficial  POST-OPERATIVE DIAGNOSIS:  same  PROCEDURE:  Procedure(s): WOUND VAC CHANGE with APPLICATION OF MYRIAD MORCELLS (N/A)  SURGEON:  Surgeon(s) and Role:    * Wonda Olds, MD - Primary  PHYSICIAN ASSISTANT:   ASSISTANTS: staff   ANESTHESIA:   IV sedation  EBL:  minimal  BLOOD ADMINISTERED:none  DRAINS: none   LOCAL MEDICATIONS USED:  NONE  SPECIMEN:  No Specimen  DISPOSITION OF SPECIMEN:  N/A  COUNTS:  YES  TOURNIQUET:  * No tourniquets in log *  DICTATION: .Note written in paper chart  PLAN OF CARE: Admit to inpatient   PATIENT DISPOSITION:  PACU - hemodynamically stable.   Delay start of Pharmacological VTE agent (>24hrs) due to surgical blood loss or risk of bleeding: no

## 2020-10-23 NOTE — Progress Notes (Signed)
CARDIAC REHAB PHASE I   PRE:  Rate/Rhythm: 100 afib    BP: sitting 130/89    SaO2:   MODE:  Ambulation: 340 ft   POST:  Rate/Rhythm: 100 afib    BP: sitting 121/79     SaO2: 97 RA  Tolerated well, walking independently, I pushed IV pole. Pt in afib today, controlled. Pt admittedly depressed and frustrated with the need for the OR today, eager to d/c.  [id number]   Darrick Meigs CES, ACSM 10/23/2020 12:02 PM

## 2020-10-23 NOTE — Progress Notes (Signed)
      Matthew Savage       Matthew Savage             Matthew Savage        19 Days Post-Op Procedure(s) (LRB): STERNAL WOUND DEBRIDEMENT (N/A) APPLICATION OF WOUND VAC 5 Days Post Op I and D sternal wound, VAC placement   Subjective: Patient states "passing a lot of gas, whole lot of rumbling" this morning.  Objective: Vital signs in last 24 hours: Temp:  [97.8 F (36.6 C)-98.3 F (36.8 C)] 98 F (36.7 C) (07/06 0300) Pulse Rate:  [68-101] 68 (07/06 0300) Cardiac Rhythm: Junctional rhythm (07/05 1909) Resp:  [14-17] 17 (07/06 0300) BP: (99-118)/(62-68) 99/62 (07/06 0300) SpO2:  [94 %-98 %] 96 % (07/06 0300) Weight:  [128.2 kg] 128.2 kg (07/06 0300)  Pre op weight 131.6 kg Current Weight  10/23/20 128.2 kg      Intake/Output from previous day: 07/05 0701 - 07/06 0700 In: 917 [P.O.:917] Out: 1270 [Urine:1250; Drains:20]   Physical Exam:  Cardiovascular: RRR, murmur heard best along sternal border Pulmonary: Slightly diminished bibasilar breath sounds Abdomen: Soft, non tender, bowel sounds present. Extremities: Bilateral feet edema;(has decreased over last week). Both LEs are wrapped this am. Wounds: Sternal and right LE wounds are clean and dry.  No erythema or signs of infection RLE wound. Lower sternal wound has some erythema superficially but no drainage this am  Lab Results: CBC: No results for input(s): WBC, HGB, HCT, PLT in the last 72 hours.  BMET:  Recent Labs    10/22/20 0038 10/23/20 0123  NA 133* 131*  K 4.3 4.6  CL 95* 92*  CO2 29 28  GLUCOSE 123* 114*  BUN 37* 35*  CREATININE 1.62* 1.69*  CALCIUM 8.8* 9.0     PT/INR:  Lab Results  Component Value Date   INR 1.3 (H) 10/03/2020   INR 1.0 10/03/2020   ABG:  INR: Will add last result for INR, ABG once components are confirmed Will add last 4 CBG results once components are confirmed  Assessment/Plan:  1. CV - A flutter;history of a fib/fl. Had LA clip placed  at the time of surgery.  PAF. On Amiodarone 400 mg bid, Isordil 10 mg tid, Lopressor 25 mg bid, and Rivaroxaban 20 mg at supper.  2.  Pulmonary - On room air. History of OSA-on CPAP at night. Encourage incentive spirometer 3. Acute on chronic heart failure- On Torsemide 40 mg daily. Appreciate advanced heart failure's assistance. Ok to place [employer]. 4.  Expected post op acute blood loss anemia - H and H yesterday stable at 8.1 and 25.4. Continue Trinsicon 6. AKI- Creatinine this am slightly increased from 1.62 to 1.69. Creatinine upon admission 1.22 .Re check in am 7. Hyponatremia-has had post op. Sodium 131. Possibly related to diuresis 8. WBC decreased yesterday from 13,700 to 11,500. Blood culture shows Serratia Marcescens.  9. On Cefepime. Appreciate infectious disease assistance. Per note, patient will need 6 weeks of treatment (IV Cefepime with possible transition to oral Bactrim or Fluoroquinolone after 2-3 weeks of IV antibiotic, with careful monitoring of creatinine and QTc). Will place PICC line   Matthew Jiminez M ZimmermanPA-C 10/23/2020,7:12 AM

## 2020-10-23 NOTE — Transfer of Care (Signed)
Immediate Anesthesia Transfer of Care Note  Patient: Matthew Savage  Procedure(s) Performed: WOUND VAC CHANGE with APPLICATION OF MYRIAD [place]  Patient Location: PACU  Anesthesia Type:MAC  Level of Consciousness: awake, alert  and oriented  Airway & Oxygen Therapy: Patient Spontanous Breathing and Patient connected to face mask oxygen  Post-op Assessment: Report given to RN and Post -op Vital signs reviewed and stable  Post vital signs: Reviewed and stable  Last Vitals:  Vitals Value Taken Time  BP 106/66 10/23/20 1628  Temp    Pulse 98 10/23/20 1628  Resp 20 10/23/20 1628  SpO2 97 % 10/23/20 1628  Vitals shown include unvalidated device data.  Last Pain:  Vitals:   10/23/20 1519  TempSrc: Oral  PainSc: 0-No pain      Patients Stated Pain Goal: 0 (50/38/88 2800)  Complications: No notable events documented.

## 2020-10-23 NOTE — Anesthesia Postprocedure Evaluation (Signed)
Anesthesia Post Note  Patient: ELIZJAH NOBLET  Procedure(s) Performed: WOUND VAC CHANGE with APPLICATION OF MYRIAD [place]     Patient location during evaluation: PACU Anesthesia Type: MAC Level of consciousness: awake and alert, patient cooperative and oriented Pain management: pain level controlled Vital Signs Assessment: post-procedure vital signs reviewed and stable Respiratory status: spontaneous breathing, nonlabored ventilation, respiratory function stable and patient connected to nasal cannula oxygen Postop Assessment: no apparent nausea or vomiting Anesthetic complications: no   No notable events documented.  Last Vitals:  Vitals:   10/23/20 1643 10/23/20 1708  BP: (!) 108/55 106/69  Pulse: 75 90  Resp: 11 17  Temp: (!) 36.2 C 36.7 C  SpO2: 95% 93%    Last Pain:  Vitals:   10/23/20 1708  TempSrc: Oral  PainSc:                  Tasneem Cormier,E. Rodrick Payson

## 2020-10-24 ENCOUNTER — Ambulatory Visit: Payer: BC Managed Care – PPO | Admitting: Internal Medicine

## 2020-10-24 ENCOUNTER — Encounter (HOSPITAL_COMMUNITY): Payer: Self-pay | Admitting: Cardiothoracic Surgery

## 2020-10-24 LAB — AEROBIC/ANAEROBIC CULTURE W GRAM STAIN (SURGICAL/DEEP WOUND): Culture: NO GROWTH

## 2020-10-24 LAB — ACID FAST SMEAR (AFB, MYCOBACTERIA): Acid Fast Smear: NEGATIVE

## 2020-10-24 LAB — CBC
HCT: 24.3 % — ABNORMAL LOW (ref 39.0–52.0)
Hemoglobin: 7.6 g/dL — ABNORMAL LOW (ref 13.0–17.0)
MCH: 30.4 pg (ref 26.0–34.0)
MCHC: 31.3 g/dL (ref 30.0–36.0)
MCV: 97.2 fL (ref 80.0–100.0)
Platelets: 257 10*3/uL (ref 150–400)
RBC: 2.5 MIL/uL — ABNORMAL LOW (ref 4.22–5.81)
RDW: 14.9 % (ref 11.5–15.5)
WBC: 8 10*3/uL (ref 4.0–10.5)
nRBC: 0 % (ref 0.0–0.2)

## 2020-10-24 MED ORDER — AMIODARONE HCL 200 MG PO TABS
200.0000 mg | ORAL_TABLET | Freq: Two times a day (BID) | ORAL | Status: DC
  Administered 2020-10-24 – 2020-11-05 (×25): 200 mg via ORAL
  Filled 2020-10-24 (×25): qty 1

## 2020-10-24 NOTE — Progress Notes (Addendum)
RN and NT removed Unna boots and cleaned pt's legs. Skin is intact and legs are visibly less swollen than the day wraps were applied by ortho. RN will page ortho.  RN paged ortho at 1336.

## 2020-10-24 NOTE — Progress Notes (Addendum)
CSW spoke with the patients brother, Wendal Wilkie [phone]. Cristie Hem reported that he would like to move his brother to [place] when he is medically able to travel. He would like a referral sent to Dr. Gifford Shave and he stated,  "please pass on the info for the cardiology office I was telling you about up here in [place] is:  [facility] (Dr. Gifford Shave) [address] [place]. [address] Phone: [phone]  Fax: [phone]   If you could please pass the info for the Dr. to the Dr. to get his records and such sent that would be great.   I'm going to be bringing my brother back to [place] when he's discharged from rehab and these guys are great and very close to my house."  CSW sent a message to the cardiologist about making the referral and cardiology reported that "CT surgery is managing his d/c rehab."   Lyrique Hakim, MSW, LCSWA [phone] Heart Failure Social Worker

## 2020-10-24 NOTE — Plan of Care (Signed)

## 2020-10-24 NOTE — Plan of Care (Signed)

## 2020-10-24 NOTE — Progress Notes (Addendum)
Pharmacy Antibiotic Note  Matthew Savage is a 60 y.o. male admitted on 09/29/2020 with concern for sternal wound infection. Now with serratia marcescens in 2/2 blood cultures. 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   WBC down to 8.0 and patient afebrile. CT of chest showed a retrosternal fluid collection - wound vac placed 7/06. Scr 1.69 with CrCl ~ 70 ml/min.   Plan: Continue Cefepime 2 gm IV Q 8 hours Plan for 6 weeks therapy with possible PO transition - ID following Monitor renal function closely for need for dose reduction   Height: 6\' 3"  (190.5 cm) Weight: 129.5 kg (285 lb 7.9 oz) IBW/kg (Calculated) : 84.5  Temp (24hrs), Avg:98 F (36.7 C), Min:97.2 F (36.2 C), Max:98.7 F (37.1 C)  Recent Labs  Lab 10/18/20 0047 10/19/20 0024 10/20/20 0232 10/22/20 0038 10/23/20 0123 10/24/20 0042  WBC 13.7* 11.5*  --   --   --  8.0  CREATININE 1.82* 1.53* 1.63* 1.62* 1.69*  --      Estimated Creatinine Clearance: 67.4 mL/min (A) (by C-G formula based on SCr of 1.69 mg/dL (H)).    Allergies  Allergen Reactions   Levaquin [Levofloxacin]     Aortic Aneurysm    Antimicrobials this admission: Vancomycin 6/30 >> 7/1 Cefepime 6/30 >>   Dose adjustments this admission: N/A  Microbiology results: 7/5 Bcx: NG x2d 7/1 Wound cx: serratia 6/30 BCx: 2/2 serratia (R:cefazolin) 6/13 COVID PCR: neg  Thank you for allowing pharmacy to be a part of this patient's care.  Laurey Arrow, PharmD PGY1 Pharmacy Resident   Please check [email or web address] for unit-specific pharmacy phone numbers.

## 2020-10-24 NOTE — TOC Progression Note (Addendum)
Transition of Care (TOC) - Progression Note  Heart Failure   Patient Details  Name: Matthew Savage MRN: [id number] Date of Birth: 1960-10-04  Transition of Care [facility]) CM/SW [place], [place] Phone Number: 10/24/2020, 12:00 PM  Clinical Narrative:    CSW received consult for possible SNF placement at time of discharge. CSW spoke with patient at bedside. Patient reported that he is currently unable to care for himself at his home given his current physical needs and fall risk. Patient expressed understanding of PT recommendation and is agreeable to SNF placement at time of discharge. Patient reports preference for the highest rated facility and whichever is closest to his home. CSW discussed insurance authorization process and provided Medicare SNF ratings list. Patient has received the COVID vaccines. Patient expressed being hopeful for rehab and to feel better soon. No further questions reported at this time. [name] asked the CSW to communicate this to his brother Matthew Savage as well and share this information with and keep him informed.  CSW spoke with [facility] as this is patients first choice then next would be [facility] in [place], [place] and informed [facility] that [name] is on IV antibiotics and they will need to order a Wound Vac for the patient and start the insurance authorization. [facility] reported that they will get back to the Education officer, museum. [facility] reported they cannot take the patients wound vac.  CSW reached out to [facility] to see if they are available to accept the patient for rehab and the CSW is awaiting a response.  2:56pm - CSW received a call from [place] at [facility] who reported that she will take a look at the patient in the hub and let the CSW know. CSW informed Ebony Hail about the wound vac and IV antibiotics and they will need to order before he is discharged.  Skilled Nursing Rehab Facilities-   [email or web address] *Ratings updated  quarterly   Ratings out of 5 possible   Name Address  Phone # [place] Inspection Overall  [facility] [address], [place] 5  4 4   [facility]  [address] [place], [place] [phone] 4 2 4 4   [facility] [place], [place] 2 3 1 1   [place], [place] 3 3 4 4   [facility] [address] [place] 3  2 2   [facility] [place] 2 2 4 4   [facility] [address], [place] 5 2 2 3   [place], [place] 4 2 2 2   [facility] [place] 5 2 2 3   [facility] [phone], [place] [phone] 5 1 2 2   [facility] [address] [facility] [phone] 5 2 2 3   [place] [address] [place] [phone] 3 1 1 1            Expected Discharge Plan and Services                                                 Social Determinants of Health (SDOH) Interventions    Readmission Risk Interventions No flowsheet data found.  Ysidro Ramsay, MSW, [place] Heart Failure Social Worker

## 2020-10-24 NOTE — Progress Notes (Signed)
CARDIAC REHAB PHASE I   PRE:  Rate/Rhythm: 99 afib  BP:  Sitting: 103/63      SaO2: 95 RA  MODE:  Ambulation: 340 ft   POST:  Rate/Rhythm: 103 afib  BP:  Sitting: 115/74      SaO2: 96 RA  Pt tolerated exercise well and amb 340 ft independently, I pushed IV pole. Pt in afib today, controlled. Pt still frustrated that he is still here and wants to see cardiologist. Pt wants to go home.  Dw[place], MS, ACM-CEP 10/24/2020 11:33 AM

## 2020-10-24 NOTE — Progress Notes (Signed)
[address]       [place], [address]             [phone]                               1 Day Post Op Wound VAC change and placement of Myriad Morcells 20 Days Post-Op Procedure(s) (LRB): STERNAL WOUND DEBRIDEMENT (N/A) APPLICATION OF WOUND VAC 6 Days Post Op I and D sternal wound, VAC placement     Subjective: Patient states, "any good news"?   Objective: Vital signs in last 24 hours: Temp:  [97.8 F (36.6 C)-98.3 F (36.8 C)] 98 F (36.7 C) (07/06 0300) Pulse Rate:  [68-101] 68 (07/06 0300) Cardiac Rhythm: Junctional rhythm (07/05 1909) Resp:  [14-17] 17 (07/06 0300) BP: (99-118)/(62-68) 99/62 (07/06 0300) SpO2:  [94 %-98 %] 96 % (07/06 0300) Weight:  [128.2 kg] 128.2 kg (07/06 0300)   Pre op weight 131.6 kg    Current Weight  10/23/20 128.2 kg      Intake/Output from previous day: 07/05 0701 - 07/06 0700 In: 917 [P.O.:917] Out: 1270 [Urine:1250; Drains:20]     Physical Exam:   Cardiovascular: RRR, murmur heard best along sternal border Pulmonary: Mostly clear Abdomen: Soft, non tender, bowel sounds present. Extremities: Bilateral feet edema;(has decreased over last week). Both LEs are wrapped again this am. Wounds: Right LE wounds are clean and dry.  No erythema or signs of infection RLE wound. Wound VAC in place over lower sternal wound.  Lab Results: CBC:  Recent Labs (last 2 labs)   No results for input(s): WBC, HGB, HCT, PLT in the last 72 hours.    BMET:  Recent Labs (last 2 labs)       Recent Labs    10/22/20 0038 10/23/20 0123  NA 133* 131*  K 4.3 4.6  CL 95* 92*  CO2 29 28  GLUCOSE 123* 114*  BUN 37* 35*  CREATININE 1.62* 1.69*  CALCIUM 8.8* 9.0       PT/INR:  Recent Labs       Lab Results  Component Value Date    INR 1.3 (H) 10/03/2020    INR 1.0 10/03/2020      ABG:  INR: Will add last result for INR, ABG once components are confirmed Will add last 4 CBG results once components are confirmed    Assessment/Plan:   1. CV - A flutter;history of a fib/fl. Had LA clip placed at the time of surgery.  On Amiodarone 400 mg bid, Isordil 10 mg tid, Lopressor 25 mg bid, and Rivaroxaban 20 mg at supper. Will decrease Amiodarone to 200 mg bid. 2.  Pulmonary - On room air. History of OSA-on CPAP at night. Encourage incentive spirometer 3. Acute on chronic heart failure- On Torsemide 40 mg daily. Appreciate advanced heart failure's assistance. Ok to place [employer]. 4.  Expected post op acute blood loss anemia - H and H this am decreased to 7.6 and 24.3. Continue Trinsicon 6. AKI- Creatinine this am slightly increased from 1.62 to 1.69. Creatinine upon admission 1.22 .Re check in am 7. Hyponatremia-has had post op. Sodium yesterday 131.  8. WBC decreased yesterday from 11,500 to 8000. Blood culture shows Serratia Marcescens. Repeat BC shows no growth thus far. Once final shows no growth, will have PICC placed 9. On Cefepime. Appreciate infectious disease assistance. Per note, patient will need 6 weeks of  treatment (IV Cefepime with possible transition to oral Bactrim or Fluoroquinolone after 2-3 weeks of IV antibiotic, with careful monitoring of creatinine and QTc). Will place PICC line after latest BC negative (no growth for 24 hours thus far).     Yelena Metzer M ZimmermanPA-C 10/23/2020,7:12 AM

## 2020-10-24 NOTE — Progress Notes (Signed)
ID Brief Note   Patient had wound vac change with application of myriad morcells on 7/6 by DR Atkins   Patient is afebrile, no leukocytosis Repeat blood cultures 7/5 no growth in 48 hrs   Dispo planning by primary team, possibly SNF/rehab then move to his brother's place in [place]  OK to place a PICC line since repeat blood cultures are negative in 48 hrs Continue cefepime for 6 weeks from date of negative blood cultures on 7/5. Tentative end date is 12/03/20. OK to switch cefepime to ertapenem for dosing convenience at home. Will place OPAT orders and ID follow up ID will sign off for now. Please call back with questions.  Sabina Manandhar, MD Infectious Disease Physician [place]  [facility] [address] , [place] [address] Phone: [phone]  Fax: [phone]  

## 2020-10-25 ENCOUNTER — Inpatient Hospital Stay (HOSPITAL_COMMUNITY): Payer: BC Managed Care – PPO

## 2020-10-25 ENCOUNTER — Telehealth (HOSPITAL_COMMUNITY): Payer: Self-pay

## 2020-10-25 ENCOUNTER — Inpatient Hospital Stay: Payer: Self-pay

## 2020-10-25 LAB — BASIC METABOLIC PANEL
Anion gap: 12 (ref 5–15)
BUN: 47 mg/dL — ABNORMAL HIGH (ref 6–20)
CO2: 25 mmol/L (ref 22–32)
Calcium: 8.8 mg/dL — ABNORMAL LOW (ref 8.9–10.3)
Chloride: 91 mmol/L — ABNORMAL LOW (ref 98–111)
Creatinine, Ser: 1.94 mg/dL — ABNORMAL HIGH (ref 0.61–1.24)
GFR, Estimated: 39 mL/min — ABNORMAL LOW (ref >60)
Glucose, Bld: 130 mg/dL — ABNORMAL HIGH (ref 70–99)
Potassium: 4.4 mmol/L (ref 3.5–5.1)
Sodium: 128 mmol/L — ABNORMAL LOW (ref 135–145)

## 2020-10-25 LAB — SARS CORONAVIRUS 2 (TAT 6-24 HRS): SARS Coronavirus 2: NEGATIVE

## 2020-10-25 MED ORDER — FE FUMARATE-B12-VIT C-FA-IFC PO CAPS: 1.0000 | ORAL_CAPSULE | Freq: Every day | ORAL | Status: DC

## 2020-10-25 MED ORDER — METOPROLOL SUCCINATE ER 25 MG PO TB24: 25.0000 mg | ORAL_TABLET | Freq: Every day | ORAL | Status: DC

## 2020-10-25 MED ORDER — TORSEMIDE 20 MG PO TABS: 20.0000 mg | ORAL_TABLET | Freq: Every day | ORAL | Status: DC

## 2020-10-25 MED ORDER — ISOSORBIDE DINITRATE 10 MG PO TABS: 10.0000 mg | ORAL_TABLET | Freq: Two times a day (BID) | ORAL | Status: DC

## 2020-10-25 MED ORDER — ACETAMINOPHEN 325 MG PO TABS: 650.0000 mg | ORAL_TABLET | Freq: Four times a day (QID) | ORAL | Status: DC | PRN

## 2020-10-25 MED ORDER — METOPROLOL TARTRATE 12.5 MG HALF TABLET
12.5000 mg | ORAL_TABLET | Freq: Two times a day (BID) | ORAL | Status: DC
  Administered 2020-10-25 – 2020-11-05 (×23): 12.5 mg via ORAL
  Filled 2020-10-25 (×24): qty 1

## 2020-10-25 MED ORDER — TORSEMIDE 20 MG PO TABS
20.0000 mg | ORAL_TABLET | Freq: Every day | ORAL | Status: DC
  Administered 2020-10-26 – 2020-11-05 (×11): 20 mg via ORAL
  Filled 2020-10-25 (×11): qty 1

## 2020-10-25 MED ORDER — ATORVASTATIN CALCIUM 80 MG PO TABS: 80.0000 mg | ORAL_TABLET | Freq: Every day | ORAL | Status: DC

## 2020-10-25 MED ORDER — OXYCODONE HCL 5 MG PO TABS: 5.0000 mg | ORAL_TABLET | Freq: Four times a day (QID) | ORAL | 0 refills | Status: DC | PRN

## 2020-10-25 MED ORDER — SODIUM CHLORIDE 0.9% FLUSH
10.0000 mL | Freq: Two times a day (BID) | INTRAVENOUS | Status: DC
  Administered 2020-10-25 – 2020-11-04 (×20): 10 mL

## 2020-10-25 MED ORDER — SODIUM CHLORIDE 0.9 % IV SOLN: 1.0000 g | INTRAVENOUS | 39 refills | Status: DC

## 2020-10-25 MED ORDER — SODIUM CHLORIDE 0.9 % IV SOLN
1.0000 g | INTRAVENOUS | Status: DC
  Administered 2020-10-25 – 2020-11-04 (×11): 1000 mg via INTRAVENOUS
  Filled 2020-10-25 (×12): qty 1

## 2020-10-25 MED ORDER — SODIUM CHLORIDE 0.9% FLUSH: 10.0000 mL | INTRAVENOUS | Status: DC | PRN

## 2020-10-25 MED ORDER — ASPIRIN 81 MG PO TBEC: 81.0000 mg | DELAYED_RELEASE_TABLET | Freq: Every day | ORAL | 11 refills | Status: DC

## 2020-10-25 MED ORDER — TORSEMIDE 20 MG PO TABS: 40.0000 mg | ORAL_TABLET | Freq: Every day | ORAL | Status: DC

## 2020-10-25 MED ORDER — COLCHICINE 0.3 MG HALF TABLET
0.3000 mg | ORAL_TABLET | Freq: Every day | ORAL | Status: DC
  Administered 2020-10-25 – 2020-11-05 (×12): 0.3 mg via ORAL
  Filled 2020-10-25 (×11): qty 1

## 2020-10-25 MED ORDER — CHLORHEXIDINE GLUCONATE CLOTH 2 % EX PADS
6.0000 | MEDICATED_PAD | Freq: Every day | CUTANEOUS | Status: DC
  Administered 2020-10-25 – 2020-11-05 (×12): 6 via TOPICAL

## 2020-10-25 MED ORDER — AMIODARONE HCL 200 MG PO TABS: 200.0000 mg | ORAL_TABLET | Freq: Every day | ORAL | Status: DC

## 2020-10-25 NOTE — TOC Progression Note (Addendum)
Transition of Care (TOC) - Progression Note  Heart Failure   Patient Details  Name: Matthew Savage MRN: [id number] Date of Birth: February 16, 1961  Transition of Care [facility]) CM/SW [place], [place] Phone Number: 10/25/2020, 9:24 AM  Clinical Narrative:    CSW received a message from [facility] asking about the patients Wound Vac and Myriad Morcells. CSW reached out to the cardiothoracic PA-C to verify questions for the SNF so they can order the Wound Vac for the patient. CSW relayed information between the two parties and verified that [facility] will change the patient's wound bi-weekly. Cardiothoracic PA-C will arranged a 7-10 day follow up for the patient to check on the wound. The SNF is working on [employer] authorization for the patient before they can discharge.   CSW notified MD and PA-C to order COVID test for the patient.  3pm - CSW spoke with the patient at bedside to update him about his discharge plan and that [facility] is working on his insurance authorization and once that is approved he can discharge to the facility. [name] asked the CSW to inform his brother, Cristie Hem about this as well. CSW and [name] discussed about transportation to the SNF can be arranged for him and [name] agreed.  3:21pm - CSW called Cristie Hem [phone] and updated him about [name] and he would like the weekend CSW to call if the insurance authorization is approved and his brother will d/c over the weekend and the CSW will have weekend staff follow up if the patient is discharged before Monday.  4:09pm - CSW received a message from [facility] that they have a [place] outbreak with 3 positive patients and they are trying to see if [name] can take if the patient decides not to go with [facility].  Unforseen circumstances will update as new developments arise. No other bed offers due to patient's complex needs.  CSW will continue to follow throughout  discharge.       Expected Discharge Plan and Services                                                 Social Determinants of Health (SDOH) Interventions    Readmission Risk Interventions No flowsheet data found.  Inaara Tye, MSW, [place] Heart Failure Social Worker

## 2020-10-25 NOTE — Progress Notes (Signed)
Diagnosis: Sternal wound infection, concerns for osteomyelitis   Culture Result: Serratia marcescens   Allergies  Allergen Reactions   Levaquin [Levofloxacin]     Aortic Aneurysm    OPAT Orders Discharge antibiotics to be given via PICC line Discharge antibiotics: ertapenem 1g iv daily  Per pharmacy protocol  Aim for Vancomycin trough 15-20 or AUC 400-550 (unless otherwise indicated) Duration: 6 weeks End Date: 12/03/20  [facility] Care Per Protocol:  Home health RN for IV administration and teaching; PICC line care and labs.    Labs weekly while on IV antibiotics: X_ CBC with differential X__ BMP __ CMP X__ CRP X__ ESR __ Vancomycin trough __ CK  __ Please pull PIC at completion of IV antibiotics X__ Please leave PIC in place until doctor has seen patient or been notified  Fax weekly labs to [phone]  Clinic Follow Up Appt: 12/02/20  @ 10:15 am   Rosiland Oz, MD Infectious Disease Physician [facility] for Infectious [facility] [address] [place], [place] [address] Phone: [phone]  Fax: [phone]

## 2020-10-25 NOTE — Progress Notes (Signed)
Peripherally Inserted Central Catheter Placement  The IV Nurse has discussed with the patient and/or persons authorized to consent for the patient, the purpose of this procedure and the potential benefits and risks involved with this procedure.  The benefits include less needle sticks, lab draws from the catheter, and the patient may be discharged home with the catheter. Risks include, but not limited to, infection, bleeding, blood clot (thrombus formation), and puncture of an artery; nerve damage and irregular heartbeat and possibility to perform a PICC exchange if needed/ordered by physician.  Alternatives to this procedure were also discussed.  Bard Power PICC patient education guide, fact sheet on infection prevention and patient information card has been provided to patient /or left at bedside.    PICC Placement Documentation  PICC Single Lumen 10/25/20 Right Brachial 40 cm 0 cm (Active)  Indication for Insertion or Continuance of Line Prolonged intravenous therapies 10/25/20 1315  Exposed Catheter (cm) 0 cm 10/25/20 1315  Site Assessment Clean;Dry;Intact 10/25/20 1315  Line Status Flushed;Saline locked;Blood return noted 10/25/20 1315  Dressing Type Transparent;Securing device 10/25/20 1315  Dressing Status Clean;Dry;Intact 10/25/20 1315  Antimicrobial disc in place? Yes 10/25/20 1315  Dressing Intervention New dressing 10/25/20 1315  Dressing Change Due 11/01/20 10/25/20 1315       [name] 10/25/2020, 1:19 PM

## 2020-10-25 NOTE — Plan of Care (Signed)

## 2020-10-25 NOTE — Progress Notes (Signed)
Chest tube sutures already removed, claimed been removed from the OR the other day.

## 2020-10-25 NOTE — Telephone Encounter (Signed)
Per the cardiac rehab phase I, fax pt cardiac rehab referral to [facility] in [place], [place].

## 2020-10-25 NOTE — Progress Notes (Addendum)
[address]       [place] [address]             [phone]                               2 Day Post Op Wound VAC change and placement of Myriad Morcells 21 Days Post-Op Procedure(s) (LRB): STERNAL WOUND DEBRIDEMENT (N/A) APPLICATION OF WOUND VAC 7 Days Post Op I and D sternal wound, VAC placement     Subjective: Patient states hopes to go to rehab   Objective: Vital signs in last 24 hours: Temp:  [97.8 F (36.6 C)-98.3 F (36.8 C)] 98 F (36.7 C) (07/06 0300) Pulse Rate:  [68-101] 68 (07/06 0300) Cardiac Rhythm: Junctional rhythm (07/05 1909) Resp:  [14-17] 17 (07/06 0300) BP: (99-118)/(62-68) 99/62 (07/06 0300) SpO2:  [94 %-98 %] 96 % (07/06 0300) Weight:  [128.2 kg] 128.2 kg (07/06 0300)   Pre op weight 131.6 kg    Current Weight  10/23/20 128.2 kg      Intake/Output from previous day: 07/05 0701 - 07/06 0700 In: 917 [P.O.:917] Out: 1270 [Urine:1250; Drains:20]     Physical Exam:   Cardiovascular: RRR, murmur heard best along sternal border Pulmonary: Mostly clear Abdomen: Soft, non tender, bowel sounds present. Extremities: Bilateral feet edema;(has decreased over last week). Both LEs are wrapped again this am. Wounds: Right LE wounds are clean and dry.  No erythema or signs of infection RLE wound. Wound VAC in place over lower sternal wound.  Lab Results: CBC:  Recent Labs (last 2 labs)   No results for input(s): WBC, HGB, HCT, PLT in the last 72 hours.    BMET:  Recent Labs (last 2 labs)       Recent Labs    10/22/20 0038 10/23/20 0123  NA 133* 131*  K 4.3 4.6  CL 95* 92*  CO2 29 28  GLUCOSE 123* 114*  BUN 37* 35*  CREATININE 1.62* 1.69*  CALCIUM 8.8* 9.0       PT/INR:  Recent Labs       Lab Results  Component Value Date    INR 1.3 (H) 10/03/2020    INR 1.0 10/03/2020      ABG:  INR: Will add last result for INR, ABG once components are confirmed Will add last 4 CBG results once components are  confirmed   Assessment/Plan:   1. CV - A flutter;history of a fib/fl. Had LA clip placed at the time of surgery.  SR with HR in the 70's this am. On Amiodarone 200 mg bid, Isordil 10 mg tid, Lopressor 25 mg bid, and Rivaroxaban 20 mg at supper. Due to lower BP,HR will stop Isordil and decrease Lopressor to 12.5 mg bid (as discussed with Dr. Orvan Seen). 2.  Pulmonary - On room air. History of OSA-on CPAP at night. CXR this am appears to show increased consolidation on the left (on lateral does not appear to be much fluid)? Atelectasis. Encourage incentive spirometer 3. Acute on chronic heart failure- On Torsemide 40 mg daily. Appreciate advanced heart failure's assistance.  4.  Expected post op acute blood loss anemia - H and H this am decreased to 7.6 and 24.3. Continue Trinsicon 6. AKI- Creatinine this am slightly increased from 1.69 to 1.94. Creatinine upon admission 1.22 .Will hold Torsemide this am. He is not on ACE/ARB. Monitor creatinine 7. Hyponatremia-has had post op. Sodium  this am decreased to 128 8. WBC decreased yesterday from 11,500 to 8000. Blood culture shows Serratia Marcescens. Repeat BC shows no growth thus far. 9. On Cefepime. Appreciate infectious disease assistance. Per note, patient will need 6 weeks of treatment (IV Cefepime) with end date 12/03/2020. Per note, possible to transition to Ertapenem when discharged from SNF for more convenient dosing. Will place PICC line as latest BC negative (no growth thus far). 10. As discussed with Dr. Orvan Seen, to SNF once bed available.   Donielle M ZimmermanPA-C 10/23/2020,7:12 AM    Appearing ready for d/c; we can change his vac dressing twice per week in office (Mon-Thurs/Fri or Tues- Fri).  Lokelani Lutes Z. Orvan Seen, [place]

## 2020-10-25 NOTE — Progress Notes (Signed)
CARDIAC REHAB PHASE I   PRE:  Rate/Rhythm: 79 afib    BP: sitting 105/68    SaO2: 95 RA  MODE:  Ambulation: 400 ft   POST:  Rate/Rhythm: 102 afib    BP: sitting 132/69     SaO2: 96 RA  Pt ambulated independently without c/o. Remains in afib/flutter. Encouraged IS and continued walking in SNF. Declined increasing distance.  [place], ACSM 10/25/2020 2:40 PM

## 2020-10-25 NOTE — Progress Notes (Signed)
PHARMACY CONSULT NOTE FOR:  OUTPATIENT  PARENTERAL ANTIBIOTIC THERAPY (OPAT)  Indication: Serratia Bacteremia and Sternal Wound Infection Regimen: Ertapenem IV 1g q24h End date: 12/03/20  IV antibiotic discharge orders are pended. To discharging provider:  please sign these orders via discharge navigator,  Select New Orders & click on the button choice - Manage This Unsigned Work.     Thank you for allowing pharmacy to be a part of this patient's care.  Willette Cluster 10/25/2020, 8:42 AM

## 2020-10-26 LAB — BASIC METABOLIC PANEL
Anion gap: 7 (ref 5–15)
BUN: 40 mg/dL — ABNORMAL HIGH (ref 6–20)
CO2: 28 mmol/L (ref 22–32)
Calcium: 9 mg/dL (ref 8.9–10.3)
Chloride: 96 mmol/L — ABNORMAL LOW (ref 98–111)
Creatinine, Ser: 1.53 mg/dL — ABNORMAL HIGH (ref 0.61–1.24)
GFR, Estimated: 52 mL/min — ABNORMAL LOW (ref >60)
Glucose, Bld: 105 mg/dL — ABNORMAL HIGH (ref 70–99)
Potassium: 4.6 mmol/L (ref 3.5–5.1)
Sodium: 131 mmol/L — ABNORMAL LOW (ref 135–145)

## 2020-10-26 LAB — ACID FAST SMEAR (AFB, MYCOBACTERIA): Acid Fast Smear: NEGATIVE

## 2020-10-26 MED ORDER — POLYETHYLENE GLYCOL 3350 17 G PO PACK
17.0000 g | PACK | Freq: Every day | ORAL | Status: DC
  Administered 2020-10-26 – 2020-11-05 (×7): 17 g via ORAL
  Filled 2020-10-26 (×11): qty 1

## 2020-10-26 NOTE — Plan of Care (Signed)

## 2020-10-26 NOTE — TOC Progression Note (Addendum)
Transition of Care [facility]) - Progression Note    Patient Details  Name: STEN DEMATTEO MRN: [id number] Date of Birth: 23-Dec-1960  [facility] CM/SW Contact  Ina Homes, [place] Phone Number: 10/26/2020, 1:32 PM  Clinical Narrative:     SW left VM with [facility] rep Ebony Hail [phone]) for clarity on availability at [facility] (reported outbreak 7/8) and [facility] facilities.   Update 230pm SW received callback from [facility], who reports she is still waiting to hear back from her facilities regarding ETA on wound vac. Pt will also still need auth started pending which facility pt is able to d/c to [facility]). Ebony Hail will update when more information available.       Expected Discharge Plan and Services                                                 Social Determinants of Health (SDOH) Interventions    Readmission Risk Interventions No flowsheet data found.

## 2020-10-26 NOTE — Progress Notes (Addendum)
[address]       [place], [address]             [phone]                               2 Day Post Op Wound VAC change and placement of Myriad Morcells 21 Days Post-Op Procedure(s) (LRB): STERNAL WOUND DEBRIDEMENT (N/A) APPLICATION OF WOUND VAC 7 Days Post Op I and D sternal wound, VAC placement     Subjective: Patient had difficulty having bowel movement last night. Requesting something to help this am.   Objective: Vital signs in last 24 hours: Temp:  [97.8 F (36.6 C)-98.3 F (36.8 C)] 98 F (36.7 C) (07/06 0300) Pulse Rate:  [68-101] 68 (07/06 0300) Cardiac Rhythm: Junctional rhythm (07/05 1909) Resp:  [14-17] 17 (07/06 0300) BP: (99-118)/(62-68) 99/62 (07/06 0300) SpO2:  [94 %-98 %] 96 % (07/06 0300) Weight:  [128.2 kg] 128.2 kg (07/06 0300)   Pre op weight 131.6 kg    Current Weight  10/23/20 128.2 kg      Intake/Output from previous day: 07/05 0701 - 07/06 0700 In: 917 [P.O.:917] Out: 1270 [Urine:1250; Drains:20]     Physical Exam:   Cardiovascular: RRR, murmur heard best along sternal border Pulmonary: Mostly clear Abdomen: Soft, non tender, bowel sounds present. Extremities: Bilateral feet edema;(has decreased over last week). Both LEs are wrapped again this am. Wounds: Right LE wounds are clean and dry.  No erythema or signs of infection RLE wound. Wound VAC in place over lower sternal wound.  Lab Results: CBC:  Recent Labs (last 2 labs)   No results for input(s): WBC, HGB, HCT, PLT in the last 72 hours.    BMET:  Recent Labs (last 2 labs)       Recent Labs    10/22/20 0038 10/23/20 0123  NA 133* 131*  K 4.3 4.6  CL 95* 92*  CO2 29 28  GLUCOSE 123* 114*  BUN 37* 35*  CREATININE 1.62* 1.69*  CALCIUM 8.8* 9.0       PT/INR:  Recent Labs       Lab Results  Component Value Date    INR 1.3 (H) 10/03/2020    INR 1.0 10/03/2020      ABG:  INR: Will add last result for INR, ABG once components are  confirmed Will add last 4 CBG results once components are confirmed   Assessment/Plan:   1. CV - A flutter;history of a fib/fl. Had LA clip placed at the time of surgery.  SR with HR in the 70's this am. On Amiodarone 200 mg bid,  Lopressor 12.5 mg bid, and Rivaroxaban 20 mg at supper.  2.  Pulmonary - On room air. History of OSA-on CPAP at night. CXR yesterday showed increased consolidation on the left (on lateral does not appear to be much fluid)? Some fluid, atelectasis. Encourage incentive spirometer 3. Acute on chronic heart failure- Previously on Torsemide 40 mg daily. As discussed with advanced heart failure yesterday, will decrease to 20 mg daily 4.  Expected post op acute blood loss anemia - H and H this am decreased to 7.6 and 24.3. Continue Trinsicon 6. AKI- Creatinine this am decreased from 1.94 to 1.53. Creatinine upon admission 1.22 .He is not on ACE/ARB.  7. Hyponatremia-has had post op. Sodium this am increased to 131 8. Last WBC decreased from 11,500 to 8000. Blood  culture shows Serratia Marcescens. Repeat BC shows no growth thus far. 9. PICC line placed yesterday. Per infectious disease, Cefepime changed to Ertapenem for easier dosing. Last day of IV antibiotic 12/03/2020. 10. Miralax for constipation 11. As discussed with Dr. Orvan Seen, to SNF once bed available.   Donielle M ZimmermanPA-C 10/23/2020,7:12 AM    I have seen and examined the patient and agree with the assessment and plan as outlined.  Rexene Alberts, MD 10/26/2020 10:43 AM

## 2020-10-26 NOTE — Progress Notes (Signed)
Progress Note  Patient Name: Matthew Savage Date of Encounter: 10/26/2020  Primary Cardiologist: Werner Lean, MD   Subjective   No events since last cardiology eval last seen 10/21/20.   Planned for DC to SNF (in [place]) 10/25/20 but there was a COVID-19 outbreak in the facility.  Hospital Course: -09/29/2020: admitted with NSTEMI -10/01/2020: cardiac cath demonstrated multivessel CAD -10/02/2020: EF 50-55%, moderate concentric LVH, normal RV size and systolic function, moderate aortic valve stenosis with mean gradient of 29 mmHg -10/03/2020: CABG X 4, endarterectomy of OM1 and PDA, ASD repair, clipping of left atrial appendage -Post-op course complicated by AKI, acute blood loss anemia, ileus (resolved), and post-op atrial flutter - 10/07/2020: EF 45-50%, moderate hypokinesis/dyskinesis left ventricular, mid-apical anterior wall, anteroseptal wall and inferoapical segment, moderate aortic valve stenosis with mean gradient of 26 mmHg  -10/15/2020: Switched IV lasix to torsemide 40 mg po daily. Lower extremity edema predominately due to venous stasis.  -10/17/2020: started on IV amio for atypical flutter/atrial tach   Inpatient Medications    Scheduled Meds:  allopurinol  300 mg Oral Daily   amiodarone  200 mg Oral BID   aspirin EC  81 mg Oral Daily   atorvastatin  80 mg Oral Daily   Chlorhexidine Gluconate Cloth  6 each Topical Daily   colchicine  0.3 mg Oral Daily   feeding supplement  237 mL Oral BID BM   ferrous [id number] C-folic acid  1 capsule Oral BID PC   guaiFENesin  1,200 mg Oral BID   metoprolol tartrate  12.5 mg Oral BID   pantoprazole  40 mg Oral Daily   polyethylene glycol  17 g Oral Daily   potassium chloride  20 mEq Oral Daily   rivaroxaban  20 mg Oral Q supper   sodium chloride flush  10-40 mL Intracatheter Q12H   torsemide  20 mg Oral Daily   traMADol  50 mg Oral QHS   Continuous Infusions:  sodium chloride     sodium chloride 20 mL/hr at  10/03/20 1507   ertapenem 1,000 mg (10/26/20 0914)   PRN Meds: acetaminophen, dextrose, fluticasone, ipratropium-albuterol, metoprolol tartrate, ondansetron (ZOFRAN) IV, oxyCODONE, simethicone, sodium chloride flush, sodium chloride flush, sodium chloride flush, zolpidem   Vital Signs    Vitals:   10/25/20 2100 10/25/20 2300 10/26/20 0300 10/26/20 0707  BP: 113/74 113/72 101/62 (!) 101/58  Pulse: 69 70 87   Resp:  18 15 18   Temp: 98.2 F (36.8 C) 98 F (36.7 C) 98.5 F (36.9 C) 98.5 F (36.9 C)  TempSrc:  Oral Oral Oral  SpO2: 93% 95% 97% 97%  Weight:   127.1 kg   Height:        Intake/Output Summary (Last 24 hours) at 10/26/2020 1045 Last data filed at 10/26/2020 0915 Gross per 24 hour  Intake 540 ml  Output 2275 ml  Net -1735 ml   Filed Weights   10/24/20 0300 10/25/20 0557 10/26/20 0300  Weight: 129.5 kg 135 kg 127.1 kg    Telemetry    PAF; presently in SR- Personally Reviewed  ECG    No new - Personally Reviewed  Physical Exam   GEN: No acute distress, obese male Neck: No JVD Cardiac: RRR, systolic murmur noted; no rubs, or gallops.  Respiratory: Clear to auscultation bilaterally. GI: Soft, nontender, non-distended  MS: +2 edema above bilateral leg dressing; No deformity.; wound vac on Neuro:  Nonfocal  Psych: Normal affect, depressed mood  Labs  Chemistry Recent Labs  Lab 10/23/20 0123 10/25/20 0114 10/26/20 0500  NA 131* 128* 131*  K 4.6 4.4 4.6  CL 92* 91* 96*  CO2 28 25 28   GLUCOSE 114* 130* 105*  BUN 35* 47* 40*  CREATININE 1.69* 1.94* 1.53*  CALCIUM 9.0 8.8* 9.0  GFRNONAA 46* 39* 52*  ANIONGAP 11 12 7      Hematology Recent Labs  Lab 10/24/20 0042  WBC 8.0  RBC 2.50*  HGB 7.6*  HCT 24.3*  MCV 97.2  MCH 30.4  MCHC 31.3  RDW 14.9  PLT 257    Cardiac EnzymesNo results for input(s): TROPONINI in the last 168 hours. No results for input(s): TROPIPOC in the last 168 hours.   BNP No results for input(s): BNP, PROBNP in the  last 168 hours.    DDimer No results for input(s): DDIMER in the last 168 hours.   Radiology    DG Chest 2 View  Result Date: 10/25/2020 CLINICAL DATA:  Status post debridement of sternal wound after prior CABG. EXAM: CHEST - 2 VIEW COMPARISON:  And CT of the chest on 10/17/2020 and chest x-ray on 10/14/2020. FINDINGS: Larger left pleural effusion and left lower lobe atelectasis no pneumothorax or pulmonary edema. Tiny amount of right pleural fluid remains. IMPRESSION: Increased left lower lobe atelectasis and left pleural fluid. Electronically Signed   By: Aletta Edouard M.D.   On: 10/25/2020 09:21   [place] EKG SITE RITE  Result Date: 10/25/2020 If Site Rite image not attached, placement could not be confirmed due to current cardiac rhythm.   Cardiac Studies  Transthoracic Echocardiogram: Date: 10/07/20 Results:  1. Left ventricular ejection fraction, by estimation, is 45 to 50%. The  left ventricle has mildly decreased function. T   2. Moderate aortic valve stenosis. Aortic valve area, by VTI measures 1.60 cm. Aortic valve mean gradient measures 26.0 mmHg. Aortic valve Vmax measures 3.56 m/s.   NonCardiac CT : Date: 10/17/20 Results: IMPRESSION: 1. Interval median sternotomy and CABG with approximately 37 mL of retrosternal fluid in the superior mediastinum. Abscess is not excluded, but simple density and lack of regional lymphadenopathy suggest that postoperative seroma may be most likely. No suspicious bony changes to the sternum. No soft tissue gas.   2. There are mildly reactive appearing lymph nodes along the surface of the diaphragm. No pericardial effusion.   3. Small new bilateral pleural effusions with dependent and perihilar atelectasis. No pneumothorax.   4. 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  Left/Right Heart Catheterizations: Date: 10/01/20 Results: Left Anterior Descending  Prox LAD lesion is 70% stenosed.  Mid LAD lesion is 80%  stenosed.  Left Circumflex  Prox Cx lesion is 80% stenosed.  Mid Cx to Dist Cx lesion is 100% stenosed.  First Obtuse Marginal Branch  1st Mrg-1 lesion is 95% stenosed.  1st Mrg-2 lesion is 100% stenosed.  Second Obtuse Marginal Branch  Vessel is small in size.  2nd Mrg lesion is 80% stenosed.  Lateral Second Obtuse Marginal Branch  Lat 2nd Mrg lesion is 80% stenosed.  Right Coronary Artery  Ost RCA lesion is 70% stenosed.  Prox RCA to Dist RCA lesion is 100% stenosed.  Right Posterior Atrioventricular Artery  Collaterals  RPAV filled by collaterals from Ost RCA.    Collaterals  RPAV filled by collaterals from Mid Cx.    Patient Profile     60 y.o. male with a history HTN, Morbid Obesity, and AF who presents with NSTEMI  and prolonged hospital stay  Assessment & Plan    NSTEMI s/p CABG  (LIMA to LAD, sequential SVG to OM1 and OM2, RIMA to PDA), coronary artery endarterectomies to OM2 and PDA PAF s/p 2021 Ablation and s/p ASD repair at 6/16 CABG S/p LAA clipping Morbid Obesity HTN and OSA Hx of DVT AKI with significant recovery - torsemide 20 mg - metoprolol 12.5 mg PO BID - amiodarone 200 mg BID-> would transition to 200 mg daily at DC - ASA & Xarelto - continue atorvastatin 80 mg PO Daily   Patient plans to follow up [place] term in [place]  at [facility] with his brother and plans to establish with Dr. [facility] ([address] [phone], [phone] Fax [for [facility])  Patient will need DC summary sent to this group at DC  For questions or updates, please contact [facility] Please consult [email or web address] for contact info under Cardiology/STEMI.      Signed, Werner Lean, MD  10/26/2020, 10:45 AM

## 2020-10-27 LAB — CULTURE, BLOOD (ROUTINE X 2)
Culture: NO GROWTH
Culture: NO GROWTH
Special Requests: ADEQUATE
Special Requests: ADEQUATE

## 2020-10-27 NOTE — Progress Notes (Addendum)
Matthew Savage       [place] [address]             [phone]                               2 Day Post Op Wound VAC change and placement of Myriad Morcells 21 Days Post-Op Procedure(s) (LRB): STERNAL WOUND DEBRIDEMENT (N/A) APPLICATION OF WOUND VAC 7 Days Post Op I and D sternal wound, VAC placement     Subjective: Patient states he had shortness of breath last night. He was put on oxygen and felt better.   Objective: Vital signs in last 24 hours: Temp:  [97.8 F (36.6 C)-98.3 F (36.8 C)] 98 F (36.7 C) (07/06 0300) Pulse Rate:  [68-101] 68 (07/06 0300) Cardiac Rhythm: Junctional rhythm (07/05 1909) Resp:  [14-17] 17 (07/06 0300) BP: (99-118)/(62-68) 99/62 (07/06 0300) SpO2:  [94 %-98 %] 96 % (07/06 0300) Weight:  [128.2 kg] 128.2 kg (07/06 0300)   Pre op weight 131.6 kg    Current Weight  10/23/20 128.2 kg      Intake/Output from previous day: 07/05 0701 - 07/06 0700 In: 917 [P.O.:917] Out: 1270 [Urine:1250; Drains:20]     Physical Exam:   Cardiovascular: RRR, murmur heard best along sternal border Pulmonary: Clear on the right and diminished left base Abdomen: Soft, non tender, bowel sounds present. Extremities: Bilateral feet edema;(has decreased over last week). Both LEs have trace edema, venous stasis changes Wounds: Right LE wounds are clean and dry.  No erythema or signs of infection RLE wound. Wound VAC in place over lower sternal wound.  Lab Results: CBC:  Recent Labs (last 2 labs)   No results for input(s): WBC, HGB, HCT, PLT in the last 72 hours.    BMET:  BMP Latest Ref Rng & Units 10/26/2020 10/25/2020 10/23/2020  Glucose 70 - 99 mg/dL 105(H) 130(H) 114(H)  BUN 6 - 20 mg/dL 40(H) 47(H) 35(H)  Creatinine 0.61 - 1.24 mg/dL 1.53(H) 1.94(H) 1.69(H)  BUN/Creat Ratio 9 - 20 - - -  Sodium 135 - 145 mmol/L 131(L) 128(L) 131(L)  Potassium 3.5 - 5.1 mmol/L 4.6 4.4 4.6  Chloride 98 - 111 mmol/L 96(L) 91(L) 92(L)  CO2 22 - 32 mmol/L  _0 Calcium 8.9 - 10.3 mg/dL 9.0 8.8(L) 9.0      ABG:  INR: Will add last result for INR, ABG once components are confirmed Will add last 4 CBG results once components are confirmed   Assessment/Plan:   1. CV - A flutter;history of a fib/fl. Had LA clip placed at the time of surgery.  SR with HR in the 70's this am. On Amiodarone 200 mg bid,  Lopressor 12.5 mg bid, and Rivaroxaban 20 mg at supper.  2.  Pulmonary - On room air. History of OSA-on CPAP at night. CXR yesterday showed increased consolidation on the left (on lateral does not appear to be much fluid)? Some fluid, atelectasis. Check CXR in am. Encourage incentive spirometer 3. Acute on chronic heart failure- Previously on Torsemide 40 mg daily. As discussed with advanced heart failure, decreased to 20 mg daily 4.  Expected post op acute blood loss anemia - H and H this am decreased to 7.6 and 24.3. Continue Trinsicon 6. AKI- Last creatinine  decreased from 1.94 to 1.53. Creatinine upon admission 1.22 .He is not on ACE/ARB.  7. Hyponatremia-has had post op.  Last sodium increased to 131 8. Last WBC decreased from 11,500 to 8000. Blood culture shows Serratia Marcescens. Repeat BC shows no growth 5 days 9. Patient has PICC line. Per infectious disease, Cefepime changed to Ertapenem for easier dosing. Last day of IV antibiotic 12/03/2020. 10. Miralax for constipation 11. As discussed with Dr. Orvan Seen, to SNF once bed available. Will likely change wound VAC in am (green material to be left in place).   Donielle M ZimmermanPA-C 10/23/2020,7:12 AM     I have seen and examined the patient and agree with the assessment and plan as outlined.  Rexene Alberts, MD 10/27/2020 10:23 AM

## 2020-10-27 NOTE — Discharge Summary (Signed)
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 10/16/2020  Lower Venous DVT Study Patient Name:  ORPHEUS HAYHURST  Date of Exam:   10/16/2020 Medical Rec #: [id number]       Accession #:    [id number] Date of Birth: 1960/08/24       Patient Gender: M Patient Age:   60Y Exam Location:  [facility] Procedure:      VAS [place] LOWER EXTREMITY VENOUS (DVT) Referring Phys: [id number] Sharalyn Ink Harika Laidlaw --------------------------------------------------------------------------------  Indications: Pain, and Swelling.  Limitations: Body habitus, poor ultrasound/tissue interface, bandages and open wound. Comparison Study: 10/11/2020 - RIGHT:                   - There is no evidence of deep vein thrombosis in the lower                   extremity.                   - No cystic structure found in the popliteal fossa.                    LEFT:                   - Findings consistent with chronic deep vein thrombosis                   involving the left popliteal vein.                   - No cystic structure found in the popliteal fossa. Performing Technologist: Oliver Hum RVT  Examination Guidelines: A complete evaluation includes B-mode imaging, spectral Doppler, color Doppler, and power Doppler as needed of all accessible portions of each vessel. Bilateral testing is considered an integral part of  a complete examination. Limited examinations for reoccurring indications may be performed as noted. The reflux portion of the exam is performed with the patient in reverse Trendelenburg.  +---------+---------------+---------+-----------+----------+--------------+ RIGHT    CompressibilityPhasicitySpontaneityPropertiesThrombus  Aging +---------+---------------+---------+-----------+----------+--------------+ CFV      Full           Yes      Yes                                 +---------+---------------+---------+-----------+----------+--------------+ SFJ      Full                                                        +---------+---------------+---------+-----------+----------+--------------+ FV Prox  Full                                                        +---------+---------------+---------+-----------+----------+--------------+ FV Mid   Full                                                        +---------+---------------+---------+-----------+----------+--------------+ FV DistalFull                                                        +---------+---------------+---------+-----------+----------+--------------+ PFV      Full                                                        +---------+---------------+---------+-----------+----------+--------------+ POP      Full           Yes      Yes                                 +---------+---------------+---------+-----------+----------+--------------+ PTV      Full                                                        +---------+---------------+---------+-----------+----------+--------------+ PERO     Full                                                        +---------+---------------+---------+-----------+----------+--------------+   +---------+---------------+---------+-----------+----------+--------------+ LEFT     CompressibilityPhasicitySpontaneityPropertiesThrombus Aging  +---------+---------------+---------+-----------+----------+--------------+ CFV      Full           Yes      Yes                                 +---------+---------------+---------+-----------+----------+--------------+  SFJ      Full                                                        +---------+---------------+---------+-----------+----------+--------------+ FV Prox  Full                                                        +---------+---------------+---------+-----------+----------+--------------+ FV Mid   Full                                                        +---------+---------------+---------+-----------+----------+--------------+ FV Distal               Yes      Yes                                 +---------+---------------+---------+-----------+----------+--------------+ PFV      Full                                                        +---------+---------------+---------+-----------+----------+--------------+ POP      Partial        Yes      Yes                  Chronic        +---------+---------------+---------+-----------+----------+--------------+ PTV      Full                                                        +---------+---------------+---------+-----------+----------+--------------+ PERO     Full                                                        +---------+---------------+---------+-----------+----------+--------------+     Summary: RIGHT: - There is no evidence of deep vein thrombosis in the lower extremity.  - No cystic structure found in the popliteal fossa.  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 - Findings appear essentially unchanged compared to previous examination. - No cystic structure found in the popliteal fossa.  *See table(s) above for measurements and observations. Electronically signed by Curt Jews MD on 10/16/2020 at 2:56:45 PM.    Final     VAS [place] LOWER EXTREMITY VENOUS (DVT)  Result Date: 10/12/2020  Lower Venous DVT Study Patient Name:  Nachmen GREENE DIODATO  Date of Exam:   10/11/2020 Medical Rec #: [id number]  Accession #:    [id number] Date of Birth: June 22, 1960       Patient Gender: M Patient Age:   60Y Exam Location:  [facility] Procedure:      VAS [place] LOWER EXTREMITY VENOUS (DVT) Referring Phys: Palmer --------------------------------------------------------------------------------  Indications: Pain and swelling LT>RT, history of DVT LT, anticoagulation therapy held s/p CABG.  Comparison Study: 12-24-2016 Most recent prior bilateral lower extremity venous                   was positive for DVT involving the LEFT Femoral V and Pop V. Performing Technologist: Darlin Coco RDMS,RVT  Examination Guidelines: A complete evaluation includes B-mode imaging, spectral Doppler, color Doppler, and power Doppler as needed of all accessible portions of each vessel. Bilateral testing is considered an integral part of a complete examination. Limited examinations for reoccurring indications may be performed as noted. The reflux portion of the exam is performed with the patient in reverse Trendelenburg.  +---------+---------------+---------+-----------+----------+--------------+ RIGHT    CompressibilityPhasicitySpontaneityPropertiesThrombus Aging +---------+---------------+---------+-----------+----------+--------------+ CFV      Full           Yes      Yes                                 +---------+---------------+---------+-----------+----------+--------------+ SFJ      Full                                                        +---------+---------------+---------+-----------+----------+--------------+ FV Prox  Full                                                        +---------+---------------+---------+-----------+----------+--------------+ FV Mid   Full                                                         +---------+---------------+---------+-----------+----------+--------------+ FV DistalFull                                                        +---------+---------------+---------+-----------+----------+--------------+ PFV      Full                                                        +---------+---------------+---------+-----------+----------+--------------+ POP      Full           Yes      Yes                                 +---------+---------------+---------+-----------+----------+--------------+ PTV  Full                                                        +---------+---------------+---------+-----------+----------+--------------+ PERO     Full                                                        +---------+---------------+---------+-----------+----------+--------------+   +---------+---------------+---------+-----------+---------------+--------------+ LEFT     CompressibilityPhasicitySpontaneityProperties     Thrombus Aging +---------+---------------+---------+-----------+---------------+--------------+ CFV      Full           Yes      Yes                                      +---------+---------------+---------+-----------+---------------+--------------+ SFJ      Full                                                             +---------+---------------+---------+-----------+---------------+--------------+ FV Prox  Full                                                             +---------+---------------+---------+-----------+---------------+--------------+ FV Mid   Full                                                             +---------+---------------+---------+-----------+---------------+--------------+ FV DistalFull                                                             +---------+---------------+---------+-----------+---------------+--------------+ PFV      Full                                                              +---------+---------------+---------+-----------+---------------+--------------+ POP      Partial        Yes      Yes        Fibrin         Chronic  stranding                     +---------+---------------+---------+-----------+---------------+--------------+ PTV      Full                                                             +---------+---------------+---------+-----------+---------------+--------------+ PERO     Full                                                             +---------+---------------+---------+-----------+---------------+--------------+     Summary: RIGHT: - There is no evidence of deep vein thrombosis in the lower extremity.  - No cystic structure found in the popliteal fossa.  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  - No cystic structure found in the popliteal fossa.  *See table(s) above for measurements and observations. Electronically signed by Servando Snare MD on 10/12/2020 at 1:37:33 PM.    Final    ECHOCARDIOGRAM LIMITED  Result Date: 10/07/2020    ECHOCARDIOGRAM LIMITED REPORT   Patient Name:   JUWUAN SEDITA Date of Exam: 10/07/2020 Medical Rec #:  [id number]      Height:       75.0 in Accession #:    [id number]     Weight:       312.8 lb Date of Birth:  November 19, 1960      BSA:          2.654 m Patient Age:    36 years       BP:           135/113 mmHg Patient Gender: M              HR:           66 bpm. Exam Location:  Inpatient Procedure: Limited Echo, Color Doppler and Intracardiac Opacification Agent Indications:    I50.40* Unspecified combined systolic (congestive) and diastolic                 (congestive) heart failure  History:        Patient has prior history of Echocardiogram examinations, most                 recent 10/02/2020. CAD and Previous Myocardial Infarction, Aortic                 Valve Disease,  Signs/Symptoms:Chest Pain; Risk Factors:Sleep                 Apnea and Hypertension.  Sonographer:    Roseanna Rainbow RDCS Referring Phys: [id number] [facility] ATKINS  Sonographer Comments: Technically difficult study due to poor echo windows and patient is morbidly obese. Image acquisition challenging due to patient body habitus. IMPRESSIONS  1. Left ventricular ejection fraction, by estimation, is 45 to 50%. The left ventricle has mildly decreased function. The left ventricle demonstrates regional wall motion abnormalities (see scoring diagram/findings for description). There is moderate left ventricular hypertrophy. There is incoordinate septal motion. There is moderate hypokinesis/dyskinesis of the left ventricular, mid-apical anterior wall, anteroseptal wall and inferoapical  segment.  2. The aortic valve has an indeterminant number of cusps. Mild to moderate aortic valve stenosis. Aortic valve area, by VTI measures 1.60 cm. Aortic valve mean gradient measures 26.0 mmHg. Aortic valve Vmax measures 3.56 m/s. Comparison(s): Changes from prior study are noted. 10/02/2020: LVEF 50-55%, mild to moderate aortic valve setnosis - mean gradient 29 mmHg. FINDINGS  Left Ventricle: Left ventricular ejection fraction, by estimation, is 45 to 50%. The left ventricle has mildly decreased function. The left ventricle demonstrates regional wall motion abnormalities. Moderate hypokinesis/dyskinesis of the left ventricular, mid-apical anterior wall, anteroseptal wall and inferoapical segment. Definity contrast agent was given IV to delineate the left ventricular endocardial borders. The left ventricular internal cavity size was normal in size. There is moderate  left ventricular hypertrophy. Incoordinate septal motion. Aortic Valve: The aortic valve has an indeterminant number of cusps. Mild to moderate aortic stenosis is present. Aortic valve mean gradient measures 26.0 mmHg. Aortic valve peak gradient measures 50.7 mmHg. Aortic valve  area, by VTI measures 1.60 cm. Venous: IVC assessment for right atrial pressure unable to be performed due to mechanical ventilation. LEFT VENTRICLE PLAX 2D LVIDd:         5.30 cm LVIDs:         4.50 cm LV PW:         1.40 cm LV IVS:        1.30 cm LVOT diam:     2.40 cm LV SV:         105 LV SV Index:   40 LVOT Area:     4.52 cm  LV Volumes (MOD) LV vol d, MOD A2C: 185.0 ml LV vol d, MOD A4C: 163.0 ml LV vol s, MOD A2C: 129.0 ml LV vol s, MOD A4C: 66.0 ml LV SV MOD A2C:     56.0 ml LV SV MOD A4C:     163.0 ml LV SV MOD BP:      76.8 ml LEFT ATRIUM         Index LA diam:    4.20 cm 1.58 cm/m  AORTIC VALVE AV Area (Vmax):    1.70 cm AV Area (Vmean):   1.59 cm AV Area (VTI):     1.60 cm AV Vmax:           356.00 cm/s AV Vmean:          230.000 cm/s AV VTI:            0.654 m AV Peak Grad:      50.7 mmHg AV Mean Grad:      26.0 mmHg LVOT Vmax:         134.00 cm/s LVOT Vmean:        80.900 cm/s LVOT VTI:          0.232 m LVOT/AV VTI ratio: 0.35  AORTA Ao Root diam: 4.10 cm MITRAL VALVE MV Area (PHT): 2.45 cm    SHUNTS MV Decel Time: 310 msec    Systemic VTI:  0.23 m MV E velocity: 76.30 cm/s  Systemic Diam: 2.40 cm MV A velocity: 62.10 cm/s MV E/A ratio:  1.23 Lyman Bishop MD Electronically signed by Lyman Bishop MD Signature Date/Time: 10/07/2020/12:52:34 PM    Final    [place] EKG SITE RITE  Result Date: 10/25/2020 If Site Rite image not attached, placement could not be confirmed due to current cardiac rhythm.  [place] EKG SITE RITE  Result Date: 10/23/2020 If Site Rite image not attached, placement could not be  confirmed due to current cardiac rhythm.  IR THORACENTESIS ASP PLEURAL SPACE W/IMG GUIDE  Result Date: 10/28/2020 Theresa Duty, NP     10/29/2020  1:11 PM PROCEDURE SUMMARY: Successful US guided left thoracentesis. Yielded 100 ml of clear yellow fluid. Remaining fluid appears loculated. [place] images saved in Epic. Pt tolerated procedure well. No immediate complications. Specimen sent for labs. CXR  ordered; pending EBL < 2 mL Theresa Duty, NP 10/28/2020 4:19 PM    Discharge Medications: Allergies as of 11/05/2020       Reactions   Levaquin [levofloxacin]    Aortic Aneurysm        Medication List     STOP taking these medications    Entresto 49-51 MG Generic drug: sacubitril-valsartan   furosemide 20 MG tablet Commonly known as: LASIX   LEG CRAMPS PO       TAKE these medications    acetaminophen 325 MG tablet Commonly known as: TYLENOL Take 2 tablets (650 mg total) by mouth every 6 (six) hours as needed for fever. What changed:  medication strength how much to take when to take this reasons to take this   allopurinol 300 MG tablet Commonly known as: ZYLOPRIM TAKE 1 TABLET BY MOUTH EVERY DAY   amiodarone 200 MG tablet Commonly known as: PACERONE Take 1 tablet (200 mg total) by mouth daily.   aspirin 81 MG EC tablet Take 1 tablet (81 mg total) by mouth daily. Swallow whole.   atorvastatin 80 MG tablet Commonly known as: LIPITOR Take 1 tablet (80 mg total) by mouth daily. What changed:  medication strength See the new instructions.   colchicine 0.6 MG tablet Take 0.6 mg by mouth daily as needed (Flair up gout).   ertapenem 1,000 mg in sodium chloride 0.9 % 100 mL Inject 1,000 mg into the vein daily.   ferrous [id number] C-folic acid capsule Commonly known as: TRINSICON / FOLTRIN Take 1 capsule by mouth daily.   metoprolol succinate 25 MG 24 hr tablet Commonly known as: TOPROL-XL Take 1 tablet (25 mg total) by mouth daily. Take with or immediately following a meal. What changed:  medication strength how much to take additional instructions   oxyCODONE 5 MG immediate release tablet Commonly known as: Oxy IR/ROXICODONE Take 1 tablet (5 mg total) by mouth every 6 (six) hours as needed for severe pain.   rivaroxaban 20 MG Tabs tablet Commonly known as: Xarelto Take 1 tablet (20 mg total) by mouth daily with supper.    torsemide 20 MG tablet Commonly known as: DEMADEX Take 1 tablet (20 mg total) by mouth daily.               Discharge Care Instructions  (From admission, onward)           Start     Ordered   10/25/20 0000  Change dressing on IV access line weekly and PRN  (Home infusion instructions - [facility] )        10/25/20 1208           The patient has been discharged on:   1.Beta Blocker:  Yes [  x ]                              No   [   ]  If No, reason:  2.Ace Inhibitor/ARB: Yes [   ]                                     No  [   x ]                                     If No, reason:AKI, labile PB  3.Statin:   Yes [ x  ]                  No  [   ]                  If No, reason:  4.Ecasa:  Yes  [ x  ]                  No   [   ]                  If No, reason:  Patient had ACS upon admission:  Plavix/P2Y12 inhibitor: Yes [   ]                                      No  [  x ] . Patient on Rivaroxaban for chronic DVT, a fib  Follow Up Appointments:  Follow-up Information     Wonda Olds, MD Follow up on 11/07/2020.   Specialty: Cardiothoracic Surgery Why: Appointment time is at 10:15 am Contact information: [address] [place] [address] [address] Follow up on 11/08/2020.   Specialty: Cardiology Why: at 2:00 Contact information: [address] [id number] [place]                Signed: [place] 11/05/2020, 8:15 AM

## 2020-10-27 NOTE — Plan of Care (Signed)

## 2020-10-28 ENCOUNTER — Inpatient Hospital Stay (HOSPITAL_COMMUNITY): Payer: BC Managed Care – PPO

## 2020-10-28 ENCOUNTER — Encounter: Payer: Self-pay | Admitting: Student

## 2020-10-28 HISTORY — PX: IR THORACENTESIS ASP PLEURAL SPACE W/IMG GUIDE: IMG5380

## 2020-10-28 LAB — BASIC METABOLIC PANEL
Anion gap: 9 (ref 5–15)
BUN: 43 mg/dL — ABNORMAL HIGH (ref 6–20)
CO2: 29 mmol/L (ref 22–32)
Calcium: 9 mg/dL (ref 8.9–10.3)
Chloride: 94 mmol/L — ABNORMAL LOW (ref 98–111)
Creatinine, Ser: 1.55 mg/dL — ABNORMAL HIGH (ref 0.61–1.24)
GFR, Estimated: 51 mL/min — ABNORMAL LOW (ref >60)
Glucose, Bld: 122 mg/dL — ABNORMAL HIGH (ref 70–99)
Potassium: 4.7 mmol/L (ref 3.5–5.1)
Sodium: 132 mmol/L — ABNORMAL LOW (ref 135–145)

## 2020-10-28 MED ORDER — LIDOCAINE HCL 1 % IJ SOLN
INTRAMUSCULAR | Status: DC | PRN
  Administered 2020-10-28: 10 mL

## 2020-10-28 MED ORDER — LIDOCAINE HCL 1 % IJ SOLN
INTRAMUSCULAR | Status: AC
  Filled 2020-10-28: qty 20

## 2020-10-28 MED ORDER — SODIUM CHLORIDE 0.9 % IV SOLN: 1.0000 g | INTRAVENOUS | 36 refills | Status: DC

## 2020-10-28 MED ORDER — RIVAROXABAN 20 MG PO TABS
20.0000 mg | ORAL_TABLET | Freq: Every day | ORAL | Status: DC
  Administered 2020-10-29 – 2020-11-04 (×7): 20 mg via ORAL
  Filled 2020-10-28 (×7): qty 1

## 2020-10-28 MED ORDER — OXYCODONE HCL 5 MG PO TABS: 5.0000 mg | ORAL_TABLET | Freq: Four times a day (QID) | ORAL | 0 refills | Status: DC | PRN

## 2020-10-28 NOTE — Progress Notes (Signed)
CARDIAC REHAB PHASE I   PRE:  Rate/Rhythm: 74 SR PVCs  BP:  Sitting: 128/66      SaO2: 95 RA  MODE:  Ambulation: 340 ft   POST:  Rate/Rhythm: 106 ST PVCs  BP:  Sitting: 133/72      SaO2: 98 RA  Pt tolerated exercise well and amb 340 ft independently. I pushed IV pole. Encouraged pt to amb farther. Pt did not want to amb farther. Pt to recliner after walk w/ call bell in reach.  [id number]  Sheppard Plumber, MS, ACM-CEP 10/28/2020 11:17 AM

## 2020-10-28 NOTE — Procedures (Addendum)
PROCEDURE SUMMARY:  Successful US guided left thoracentesis. Yielded 100 ml of clear yellow fluid. Remaining fluid appears loculated. [place] images saved in Epic.  Pt tolerated procedure well. No immediate complications.  Specimen sent for labs. CXR ordered; pending  EBL < 2 mL  Theresa Duty, NP 10/28/2020 4:19 PM

## 2020-10-28 NOTE — Progress Notes (Signed)
[address]       [place] [address]             [phone]                               5 Day Post Op Wound VAC change and placement of Myriad Morcells 24 Days Post-Op Procedure(s) (LRB): STERNAL WOUND DEBRIDEMENT (N/A) APPLICATION OF WOUND VAC 10 Days Post Op I and D sternal wound, VAC placement     Subjective: Patient slept well. He states when he uses CPAP he sleeps better.   Objective: Vital signs in last 24 hours: Temp:  [97.8 F (36.6 C)-98.3 F (36.8 C)] 98 F (36.7 C) (07/06 0300) Pulse Rate:  [68-101] 68 (07/06 0300) Cardiac Rhythm: Junctional rhythm (07/05 1909) Resp:  [14-17] 17 (07/06 0300) BP: (99-118)/(62-68) 99/62 (07/06 0300) SpO2:  [94 %-98 %] 96 % (07/06 0300) Weight:  [128.2 kg] 128.2 kg (07/06 0300)   Pre op weight 131.6 kg    Current Weight  10/23/20 128.2 kg      Intake/Output from previous day: 07/05 0701 - 07/06 0700 In: 917 [P.O.:917] Out: 1270 [Urine:1250; Drains:20]     Physical Exam:   Cardiovascular: RRR, murmur heard best along sternal border Pulmonary: Clear on the right and diminished left base Abdomen: Soft, non tender, bowel sounds present. Extremities: Bilateral feet edema;(has decreased over last week). Both LEs have trace edema, venous stasis changes Wounds: Right LE wounds are clean and dry.  No erythema or signs of infection RLE wound. Wound VAC in place over lower sternal wound.  Lab Results: CBC:  Recent Labs (last 2 labs)   No results for input(s): WBC, HGB, HCT, PLT in the last 72 hours.    BMET:  BMP Latest Ref Rng & Units 10/28/2020 10/26/2020 10/25/2020  Glucose 70 - 99 mg/dL 122(H) 105(H) 130(H)  BUN 6 - 20 mg/dL 43(H) 40(H) 47(H)  Creatinine 0.61 - 1.24 mg/dL 1.55(H) 1.53(H) 1.94(H)  BUN/Creat Ratio 9 - 20 - - -  Sodium 135 - 145 mmol/L 132(L) 131(L) 128(L)  Potassium 3.5 - 5.1 mmol/L 4.7 4.6 4.4  Chloride 98 - 111 mmol/L 94(L) 96(L) 91(L)  CO2 22 - 32 mmol/L '29 28 25  ' Calcium 8.9 -  10.3 mg/dL 9.0 9.0 8.8(L)       ABG:  INR: Will add last result for INR, ABG once components are confirmed Will add last 4 CBG results once components are confirmed   Assessment/Plan:   1. CV - A flutter;history of a fib/fl. Had LA clip placed at the time of surgery.  SR with HR in the 70's this am. On Amiodarone 200 mg bid,  Lopressor 12.5 mg bid, and Rivaroxaban 20 mg at supper.  2.  Pulmonary - On room air. History of OSA-on CPAP at night. CXR this am appears to show consolidation on the left (on lateral does not appear to be much fluid)? Some fluid, atelectasis. Encourage incentive spirometer 3. Acute on chronic heart failure- Previously on Torsemide 40 mg daily. As discussed with advanced heart failure, decreased to 20 mg daily 4.  Expected post op acute blood loss anemia - H and H this am decreased to 7.6 and 24.3. Continue Trinsicon 6. AKI- Creatinine this am remains 1.55. Creatinine upon admission 1.22 .He is not on ACE/ARB.  7. Hyponatremia-has had post op. Last sodium increased to 131 8. Last WBC decreased  from 11,500 to 8000. Blood culture shows Serratia Marcescens. Repeat BC shows no growth 5 days 9. Patient has PICC line. Per infectious disease, Cefepime changed to Ertapenem for easier dosing. Last day of IV antibiotic 12/03/2020. Will likely change wound VAC today. 10. Miralax for constipation 11. As discussed with Dr. Orvan Seen, to SNF once bed available. Will likely change wound VAC in am (green material to be left in place).   [name] Leahy M ZimmermanPA-C 10/23/2020,7:12 AM

## 2020-10-28 NOTE — Progress Notes (Signed)
CARDIAC REHAB PHASE I  Came to walk pt. Pt stated that he just walked w/ the nurse. Will follow up later.   Sheppard Plumber, MS, ACM-CEP 10/28/2020 8:49 AM

## 2020-10-28 NOTE — Plan of Care (Signed)

## 2020-10-28 NOTE — TOC Progression Note (Addendum)
Transition of Care (TOC) - Progression Note  Heart Failure   Patient Details  Name: DEJION GRILLO MRN: [id number] Date of Birth: [birth year]  Transition of Care [facility]) CM/SW [place], [place] Phone Number: 10/28/2020, 11:43 AM  Clinical Narrative:    CSW outreached Bryson Ha the admission's coordinator at [facility], SNF who reported she is waiting on the Wound Vac to be delivered for the patient. CSW received a call from the patient's brother, Cristie Hem [phone] who is concerned about the COVID outbreak at the SNF in [place], [place] and CSW indicated that his brother can go to the [facility] in [place], [place] if that is a concern as the SNF is working on the [employer], insurance authorization, and antibiotics Mr. Harkins will need at the facility. CSW Evalyn Casco, [phone] to follow up about the COVID in [place] and the insurance authorization and will update the patient and his brother once the CSW hear's back from [place].  3:07pm - CSW spoke with Bryson Ha at [facility] to discuss about the [place] concerns at the [facility] and Bryson Ha reported that 3 patients tested positive but their [facility] SNF has had 0 patients with COVID and after speaking with the patients brother, Cristie Hem that would be okay with him. Bryson Ha reported that she will start the insurance authorization for the [place] facility and wound vac to be sent there for the patient. Awaiting insurance authorization approval and wound vac to arrive at facility before patient can be discharged.  CSW will continue to follow throughout discharge.        Expected Discharge Plan and Services                                                 Social Determinants of Health (SDOH) Interventions    Readmission Risk Interventions No flowsheet data found.  Nikolis Berent, MSW, [place] Heart Failure Social Worker

## 2020-10-29 ENCOUNTER — Ambulatory Visit: Payer: BC Managed Care – PPO | Admitting: Pulmonary Disease

## 2020-10-29 NOTE — Progress Notes (Signed)
Pt has been ambulating and practicing IS. Declined ambulation now as he took oxycondone. Encouraged walking later. Yves Dill CES, ACSM 3:06 PM 10/29/2020 [phone]

## 2020-10-29 NOTE — Progress Notes (Signed)
Nutrition Follow-up  DOCUMENTATION CODES:   Obesity unspecified  INTERVENTION:   - Continue Ensure Enlive po BID, each supplement provides 350 kcal and 20 grams of protein  NUTRITION DIAGNOSIS:   Inadequate oral intake related to acute illness as evidenced by per patient/family report.  Progressing  GOAL:   Patient will meet greater than or equal to 90% of their needs  Progressing  MONITOR:   PO intake, Supplement acceptance, Labs, Weight trends  REASON FOR ASSESSMENT:   Consult Assessment of nutrition requirement/status  ASSESSMENT:   60 yo male admitted with severe 3-V CAD requiring CABG. PMH includes HLD, gout, HTN, OSA, CAD, hx of gastric lap band  6/16 - TEE, CABG x 4 7/06 - wound VAC change 7/11 - s/p thoracentesis with 60 ml fluid removed  Pt unavailable at time of RD visit. PO intake remains adequate with 7 out of last 8 meals being documented as 90-100%. Pt accepting Ensure Enlive per [facility] documentation. Plan is for pt to d/c to SNF once bed becomes available.  Admit wt: 134.7 kg Current wt: 124.9 kg  Meal Completion: 90-100%  Medications reviewed and include: Ensure Enlive BID, trinsicon, protonix, miralax, klor-con, torsemide, IV abx  Labs reviewed: sodium 132, BUN 43, creatinine 1.55, hemoglobin 7.6  UOP: 2350 ml x 24 hours  Diet Order:   Diet Order             Diet heart healthy/carb modified Room service appropriate? Yes; Fluid consistency: Thin  Diet effective now                   EDUCATION NEEDS:   Not appropriate for education at this time  Skin:  Skin Assessment: Skin Integrity Issues: Wound VAC: chest Incisions: chest, R leg  Last BM:  10/27/20  Height:   Ht Readings from Last 1 Encounters:  10/03/20 6\' 3"  (1.905 m)    Weight:   Wt Readings from Last 1 Encounters:  10/29/20 124.9 kg    BMI:  Body mass index is 34.42 kg/m.  Estimated Nutritional Needs:   Kcal:  2200-2400 kcals  Protein:  120-135  g  Fluid:  >/= 2 L    Gustavus Bryant, MS, RD, LDN Inpatient Clinical Dietitian Please see AMiON for contact information.

## 2020-10-29 NOTE — Progress Notes (Signed)
CARDIAC REHAB PHASE I   Pt seen walking independently in hallway. Breathing improved some since procedure yesterday. Pt down about long hospital stay, requesting to go outside. Encouraged pt ask MD for permission, and will try to arrange.  Rufina Falco, RN BSN 10/29/2020 8:57 AM

## 2020-10-29 NOTE — Progress Notes (Addendum)
[address]       [place] [address]             [phone]                               6 Day Post Op Wound VAC change and placement of Myriad Morcells 25 Days CABG x 4, LA clip, ASD repair with patch 11 Days Post Op I and D sternal wound, VAC placement     Subjective: Patient siting in chair. I spoke with his brother via cell phone this am.   Objective: Vital signs in last 24 hours: Temp:  [97.8 F (36.6 C)-98.3 F (36.8 C)] 98 F (36.7 C) (07/06 0300) Pulse Rate:  [68-101] 68 (07/06 0300) Cardiac Rhythm: Junctional rhythm (07/05 1909) Resp:  [14-17] 17 (07/06 0300) BP: (99-118)/(62-68) 99/62 (07/06 0300) SpO2:  [94 %-98 %] 96 % (07/06 0300) Weight:  [128.2 kg] 128.2 kg (07/06 0300)   Pre op weight 131.6 kg    Current Weight  10/23/20 128.2 kg      Intake/Output from previous day: 07/05 0701 - 07/06 0700 In: 917 [P.O.:917] Out: 1270 [Urine:1250; Drains:20]     Physical Exam:   Cardiovascular: RRR, murmur heard best along sternal border Pulmonary: Clear on the right and diminished left base Abdomen: Soft, non tender, bowel sounds present. Extremities: Bilateral feet edema;(has decreased over last week). Both LEs have trace edema, venous stasis changes Wounds: Right LE wounds are clean and dry.  No erythema or signs of infection RLE wound. Wound VAC in place over lower sternal wound.  Lab Results: CBC:  Recent Labs (last 2 labs)   No results for input(s): WBC, HGB, HCT, PLT in the last 72 hours.    BMET:  BMP Latest Ref Rng & Units 10/28/2020 10/26/2020 10/25/2020  Glucose 70 - 99 mg/dL 122(H) 105(H) 130(H)  BUN 6 - 20 mg/dL 43(H) 40(H) 47(H)  Creatinine 0.61 - 1.24 mg/dL 1.55(H) 1.53(H) 1.94(H)  BUN/Creat Ratio 9 - 20 - - -  Sodium 135 - 145 mmol/L 132(L) 131(L) 128(L)  Potassium 3.5 - 5.1 mmol/L 4.7 4.6 4.4  Chloride 98 - 111 mmol/L 94(L) 96(L) 91(L)  CO2 22 - 32 mmol/L '29 28 25  ' Calcium 8.9 - 10.3 mg/dL 9.0 9.0 8.8(L)        ABG:  INR: Will add last result for INR, ABG once components are confirmed Will add last 4 CBG results once components are confirmed   Assessment/Plan:   1. CV - A flutter;history of a fib/fl. Had LA clip placed at the time of surgery.  On Amiodarone 200 mg bid,  Lopressor 12.5 mg bid, and Rivaroxaban 20 mg at supper.  2.  Pulmonary - On room air. History of OSA-on CPAP at night. S/p left thoracentesis with 60 ml(recorded as 600 ml but per discussion with patient and secure chat with PA) it was 60 ml removed by IR;remainder of fluid is loculated. Encourage incentive spirometer 3. Acute on chronic heart failure- Previously on Torsemide 40 mg daily. As discussed with advanced heart failure, decreased to 20 mg daily 4.  Expected post op acute blood loss anemia - H and H this am decreased to 7.6 and 24.3. Continue Trinsicon 6. AKI- Creatinine this am remains 1.55. Creatinine upon admission 1.22 .He is not on ACE/ARB.  7. Hyponatremia-has had post op. Last sodium increased to 131 8. Last WBC decreased  from 11,500 to 8000. Blood culture shows Serratia Marcescens. Repeat BC shows no growth 5 days 9. Patient has PICC line. Continue Ertapenem. Last day of IV antibiotic 12/03/2020.  10. As discussed with Dr. Orvan Seen, to SNF once bed available. Will likely change wound VAC in am (green material to be left in place).   Mahira Petras M ZimmermanPA-C 10/23/2020,7:12 AM

## 2020-10-29 NOTE — Op Note (Signed)
Procedure(s): WOUND VAC CHANGE with APPLICATION OF MYRIAD MORCELLS Procedure Note  Matthew Savage male 60 y.o. 10/29/2020  Procedure(s) and Anesthesia Type:    * WOUND VAC CHANGE with APPLICATION OF MYRIAD MORCELLS - General  Surgeon(s) and Role:    * Wonda Olds, MD - Primary   Indications: The patient is status post CABG during this admission with a relatively prolonged stay due to general debilitation.  Over the past couple of weeks he has noted to have a leukocytosis and at the lower aspect of his incision was reopened by a colleague.  He is taken to the operating room for wound VAC change     Surgeon: Wonda Olds   Assistants: Staff  Anesthesia: Monitored Local Anesthesia with Sedation  ASA Class: 3    Procedure Detail  WOUND VAC CHANGE with APPLICATION OF MYRIAD [place] After informed consent, he is taken the operating room on the above date.  He is placed in the supine position on the operating table.  Anesthesia was confirmed with a IV sedation technique.  The anterior chest was cleansed and draped in a sterile field using Betadine solution.  A preop surgical pause was performed.  Copious irrigation of the lower superficial sternal defect was performed.  There was evidence for early granulation tissue.  Myriad morcells was applied to the base of the wound.  This was then covered with a VAC sponge.  A VAC drape was applied.  This concluded the procedure.  Estimated Blood Loss:  Minimal         Drains:  None         Blood Given: none          Specimens: None         Implants: none        Complications:  * No complications entered in OR log *         Disposition: PACU - hemodynamically stable.         Condition: stable

## 2020-10-30 ENCOUNTER — Inpatient Hospital Stay (HOSPITAL_COMMUNITY): Payer: BC Managed Care – PPO

## 2020-10-30 LAB — VIRUS CULTURE

## 2020-10-30 NOTE — Progress Notes (Addendum)
   [address]       [place] [address]             [phone]                               6 Day Post Op Wound VAC change and placement of Myriad Morcells 25 Days CABG x 4, LA clip, ASD repair with patch 11 Days Post Op I and D sternal wound, VAC placement     Subjective: Patient without specific complaint this am.   Objective: Vital signs in last 24 hours: Temp:  [97.8 F (36.6 C)-98.3 F (36.8 C)] 98 F (36.7 C) (07/06 0300) Pulse Rate:  [68-101] 68 (07/06 0300) Cardiac Rhythm: Junctional rhythm (07/05 1909) Resp:  [14-17] 17 (07/06 0300) BP: (99-118)/(62-68) 99/62 (07/06 0300) SpO2:  [94 %-98 %] 96 % (07/06 0300) Weight:  [128.2 kg] 128.2 kg (07/06 0300)   Pre op weight 131.6 kg    Current Weight  10/23/20 128.2 kg      Intake/Output from previous day: 07/05 0701 - 07/06 0700 In: 917 [P.O.:917] Out: 1270 [Urine:1250; Drains:20]     Physical Exam:   Cardiovascular: RRR, murmur heard best along sternal border Pulmonary: Clear on the right and diminished left base Abdomen: Soft, non tender, bowel sounds present. Extremities: Bilateral feet edema;(has decreased over last week). Both LEs have trace edema, venous stasis changes Wounds: Right LE wounds are clean and dry.  No erythema or signs of infection RLE wound. Wound VAC in place over lower sternal wound.  Lab Results: CBC:  Recent Labs (last 2 labs)   No results for input(s): WBC, HGB, HCT, PLT in the last 72 hours.    BMET:  BMP Latest Ref Rng & Units 10/28/2020 10/26/2020 10/25/2020  Glucose 70 - 99 mg/dL 122(H) 105(H) 130(H)  BUN 6 - 20 mg/dL 43(H) 40(H) 47(H)  Creatinine 0.61 - 1.24 mg/dL 1.55(H) 1.53(H) 1.94(H)  BUN/Creat Ratio 9 - 20 - - -  Sodium 135 - 145 mmol/L 132(L) 131(L) 128(L)  Potassium 3.5 - 5.1 mmol/L 4.7 4.6 4.4  Chloride 98 - 111 mmol/L 94(L) 96(L) 91(L)  CO2 22 - 32 mmol/L 29 28 25  Calcium 8.9 - 10.3 mg/dL 9.0 9.0 8.8(L)       ABG:  INR: Will add last  result for INR, ABG once components are confirmed Will add last 4 CBG results once components are confirmed   Assessment/Plan:   1. CV - A flutter;history of a fib/fl. Had LA clip placed at the time of surgery.  On Amiodarone 200 mg bid,  Lopressor 12.5 mg bid, and Rivaroxaban 20 mg at supper.  2.  Pulmonary - On room air. History of OSA-on CPAP at night. S/p left thoracentesis. CXR this am appears stable (small left pleural effusion/bibasilar atelectasis. Encourage incentive spirometer 3. Acute on chronic heart failure- Previously on Torsemide 40 mg daily. As discussed with advanced heart failure, decreased to 20 mg daily 4.  Expected post op acute blood loss anemia - H and H this am decreased to 7.6 and 24.3. Continue Trinsicon 6. AKI- Creatinine this am remains 1.55. Creatinine upon admission 1.22 .He is not on ACE/ARB.  7. Hyponatremia-has had post op. Last sodium increased to 131 8. Last WBC decreased from 11,500 to 8000. Blood culture shows Serratia Marcescens. Repeat BC shows no growth 5 days 9. Patient has PICC line. Continue Ertapenem. Last day   of IV antibiotic 12/03/2020.  10. The plan has been for patient to go to SNF once bed available. Per social worker this am, insurance has now denied SNF   Donielle M ZimmermanPA-C 10/23/2020,7:12 AM         

## 2020-10-30 NOTE — TOC Progression Note (Addendum)
Transition of Care (TOC) - Progression Note  Heart Failure  Patient Details  Name: Matthew Savage MRN: [id number] Date of Birth: 01/20/1961  Transition of Care (TOC) CM/SW Contact  Cortlin Polo, LCSWA Phone Number: 10/30/2020, 8:49 AM  Clinical Narrative:    CSW received a call from [name] with [facility] who reported that the insurance was denied for rehab. CSW spoke with the patient about this and he reported that he would like to appeal. CSW also spoke with the patient's brother, [name] [phone] regarding all of this and would like to appeal the denial. CSW spoke with [name] at [facility] regarding the patient wanting to appeal the denial.  CSW heard from [name] at [facility] that the patients claim went peer to peer and for the MD to call [phone] reference#: [id number] their denial is based on no rehab need stating his needs can be met at a lower level of care such as home health. CSW sent a secure chat to Dr. Atkins and Zimmerman to inform them about making the call and Zimmerman reported that she will make the call tomorrow.  CSW and patient discussed alternate options if the insurance is denied including home with home health if needed. Please keep patient's brother, [name] in the loop regarding disposition.   TOC will continue to follow for discharge needs.        Expected Discharge Plan and Services                                                 Social Determinants of Health (SDOH) Interventions    Readmission Risk Interventions No flowsheet data found.  Cortlin Polo, MSW, LCSWA [phone] Heart Failure Social Worker  

## 2020-10-31 ENCOUNTER — Ambulatory Visit: Payer: BC Managed Care – PPO | Admitting: Cardiothoracic Surgery

## 2020-10-31 MED ORDER — OXYCODONE HCL 5 MG PO TABS: 5.0000 mg | ORAL_TABLET | Freq: Four times a day (QID) | ORAL | 0 refills | Status: DC | PRN

## 2020-10-31 MED ORDER — SODIUM CHLORIDE 0.9 % IV SOLN: 1.0000 g | INTRAVENOUS | 31 refills | Status: DC

## 2020-10-31 NOTE — Progress Notes (Addendum)
[address]       [place] [address]             [phone]                               8 Day Post Op Wound VAC change and placement of Myriad Morcells 28 Days CABG x 4, LA clip, ASD repair with patch 13 Days Post Op I and D sternal wound, VAC placement     Subjective: Patient without specific complaint this am.   Objective: Vital signs in last 24 hours: Temp:  [97.8 F (36.6 C)-98.3 F (36.8 C)] 98 F (36.7 C) (07/06 0300) Pulse Rate:  [68-101] 68 (07/06 0300) Cardiac Rhythm: Junctional rhythm (07/05 1909) Resp:  [14-17] 17 (07/06 0300) BP: (99-118)/(62-68) 99/62 (07/06 0300) SpO2:  [94 %-98 %] 96 % (07/06 0300) Weight:  [128.2 kg] 128.2 kg (07/06 0300)   Pre op weight 131.6 kg    Current Weight  10/23/20 128.2 kg      Intake/Output from previous day: 07/05 0701 - 07/06 0700 In: 917 [P.O.:917] Out: 1270 [Urine:1250; Drains:20]     Physical Exam:   Cardiovascular:Slightly tachy this am, murmur heard best along sternal border Pulmonary: Clear on the right and diminished left base Abdomen: Soft, non tender, bowel sounds present. Extremities: Bilateral feet edema;(has decreased over last week). Both LEs have trace edema, venous stasis changes Wounds: Right LE wounds are clean and dry.  No erythema or signs of infection RLE wound. Wound VAC in place over lower sternal wound.  Lab Results: CBC:  Recent Labs (last 2 labs)   No results for input(s): WBC, HGB, HCT, PLT in the last 72 hours.    BMET:  BMP Latest Ref Rng & Units 10/28/2020 10/26/2020 10/25/2020  Glucose 70 - 99 mg/dL 122(H) 105(H) 130(H)  BUN 6 - 20 mg/dL 43(H) 40(H) 47(H)  Creatinine 0.61 - 1.24 mg/dL 1.55(H) 1.53(H) 1.94(H)  BUN/Creat Ratio 9 - 20 - - -  Sodium 135 - 145 mmol/L 132(L) 131(L) 128(L)  Potassium 3.5 - 5.1 mmol/L 4.7 4.6 4.4  Chloride 98 - 111 mmol/L 94(L) 96(L) 91(L)  CO2 22 - 32 mmol/L '29 28 25  ' Calcium 8.9 - 10.3 mg/dL 9.0 9.0 8.8(L)       ABG:   INR: Will add last result for INR, ABG once components are confirmed Will add last 4 CBG results once components are confirmed   Assessment/Plan:   1. CV - A flutter;history of a fib/fl. Had LA clip placed at the time of surgery.  On Amiodarone 200 mg bid,  Lopressor 12.5 mg bid, and Rivaroxaban 20 mg at supper.  2.  Pulmonary - On room air. History of OSA-on CPAP at night. S/p left thoracentesis. CXR this am appears stable (small left pleural effusion/bibasilar atelectasis. Encourage incentive spirometer 3. Acute on chronic heart failure- Previously on Torsemide 20 mg daily 4.  Expected post op acute blood loss anemia - H and H this am decreased to 7.6 and 24.3. Continue Trinsicon 6. AKI- Creatinine this am remains 1.55. Creatinine upon admission 1.22 .He is not on ACE/ARB.  7. Hyponatremia-has had post op. Last sodium increased to 131 8. Last WBC decreased from 11,500 to 8000. Blood culture shows Serratia Marcescens. Repeat BC shows no growth 5 days 9. Patient has PICC line. Continue Ertapenem. Last day of IV antibiotic 12/03/2020. Wound VAC to be changed  today 10. The plan has been for patient to go to SNF once bed available. Insurance has now denied SNF. Will try peer to peer appeal later today. I spoke with Cristie Hem, patient's brother, and I will speak with Dr. Orvan Seen about a "Plan B" if insurance does not appeal   Donielle M ZimmermanPA-C 10/23/2020,7:12 AM    Agree with documentation. Wound vac changed at bedside--improved granulation. Wound dimentions 3 cm deep; 5 cm long, 1 cm wide. Ashla Murph Z. Orvan Seen, [place]

## 2020-10-31 NOTE — Progress Notes (Signed)
CARDIAC REHAB PHASE I   PRE:  Rate/Rhythm: 78 afib    BP: sitting 99/59    SaO2:   MODE:  Ambulation: 800 ft   POST:  Rate/Rhythm: 101 afib    BP: sitting 109/65     SaO2:   Pt depressed, eager to go outside. Stood independently and walked independently outside, where he sat for 7-8 min and enjoyed fresh air. Stood and ambulated back to room. No assist needed. VSS, remains in controlled afib.  [place], ACSM 10/31/2020 2:12 PM

## 2020-11-01 MED ORDER — TORSEMIDE 20 MG PO TABS: 20.0000 mg | ORAL_TABLET | Freq: Every day | ORAL | 1 refills | Status: DC

## 2020-11-01 MED ORDER — METOPROLOL SUCCINATE ER 25 MG PO TB24: 25.0000 mg | ORAL_TABLET | Freq: Every day | ORAL | 1 refills | Status: DC

## 2020-11-01 MED ORDER — SODIUM CHLORIDE 0.9 % IV SOLN: 1.0000 g | INTRAVENOUS | 32 refills | Status: DC

## 2020-11-01 MED ORDER — ATORVASTATIN CALCIUM 80 MG PO TABS: 80.0000 mg | ORAL_TABLET | Freq: Every day | ORAL | 1 refills | Status: DC

## 2020-11-01 MED ORDER — OXYCODONE HCL 5 MG PO TABS: 5.0000 mg | ORAL_TABLET | Freq: Four times a day (QID) | ORAL | 0 refills | Status: DC | PRN

## 2020-11-01 MED ORDER — AMIODARONE HCL 200 MG PO TABS: 200.0000 mg | ORAL_TABLET | Freq: Every day | ORAL | 1 refills | Status: DC

## 2020-11-01 NOTE — TOC Progression Note (Addendum)
Transition of Care [facility]) - Progression Note    Patient Details  Name: GINA COSTILLA MRN: [id number] Date of Birth: 08/13/1960  Transition of Care [facility]) CM/SW Contact  Zenon Mayo, RN Phone Number: 11/01/2020, 4:14 PM  Clinical Narrative:    Peer to Peer was denied, but patient wants to do an expedited appeal per the brother Cristie Hem.  NCM spoke with [name] at the insurance company at [phone] ext [phone].  She states the patient can do an expedited appeal for the SNF and she will call the patient to connect him to the number to do the expedited appeal for the SNF.  Also this NCM contacted Olivia Mackie with [employer] for wound Vac. Also offered choice for [facility] services , he has no preference.NCM made referral to [facility] with Alvis Lemmings for [facility] for iv abx,   he can not take referral not enough staff, also made referral to [facility]ynn Sayers for the medications.  - Will need to see what the result is for the expedited appeal  is first.  NCM informed the brother Cristie Hem of this information.  NCM tried to reach [facility] but could not so called Jadene Pierini PA to get wound measurments for the wound vac.  He states he will call me back.   34-  Per brother Cristie Hem, the expedited appeal has been denied.  Will need to have a safe dc plan, wound vac will probably get her tomorrow or Sunday.  The [facility] for iv abx will have to work on Monday to see if Dimitri Ped will be able to do the iv abx and wound vac change. Carolynn Sayers also following.  Left message for Ebony Hail with Dimitri Ped to return call on Monday.   Expected Discharge Plan: [place] Barriers to Discharge: Continued Medical Work up  Expected Discharge Plan and Services Expected Discharge Plan: [place] In-house Referral: Clinical Social Work Discharge Planning Services: NA Post Acute Care Choice: [place] arrangements for the past 2 months: Single Family Home                                       Social  Determinants of Health (SDOH) Interventions    Readmission Risk Interventions No flowsheet data found.

## 2020-11-01 NOTE — Plan of Care (Signed)
  Problem: Education: Goal: Knowledge of General Education information will improve Description Including pain rating scale, medication(s)/side effects and non-pharmacologic comfort measures Outcome: Progressing   

## 2020-11-01 NOTE — TOC Progression Note (Signed)
Transition of Care [facility]) - Progression Note    Patient Details  Name: Matthew Savage MRN: [id number] Date of Birth: [birth year]  [facility]) CM/SW Contact  Reece Agar, [place] Phone Number: 11/01/2020, 3:59 PM  Clinical Narrative:    CSW spoke with pt who wants to know when he will DC so that his brother can travel to come to [place] from [place]. CSW will follow up with NCM. Shortly after pt brother Cristie Hem contacted Csw asking if pt can DC Sunday because he has to drive 12 hours. NCM will follow up with pt and family for [facility] needs.    Expected Discharge Plan: [place] Barriers to Discharge: Continued Medical Work up  Expected Discharge Plan and Services Expected Discharge Plan: [place] In-house Referral: Clinical Social Work Discharge Planning Services: NA Post Acute Care Choice: [place] arrangements for the past 2 months: Single Family Home                                       Social Determinants of Health (SDOH) Interventions    Readmission Risk Interventions No flowsheet data found.

## 2020-11-01 NOTE — Progress Notes (Signed)
I spoke with physician from [facility] about appealing decision for Mr. Leason to go to SNF. The appeal was denied. As discussed with Dr. Orvan Seen, patient will return home with brother and home health for IV antibiotic as well as bi weekly wound vac changes will be arranged. Awaiting to hear from Hilda Blades, Education officer, museum on final plans.

## 2020-11-01 NOTE — Progress Notes (Addendum)
CARDIAC REHAB PHASE I   PRE:  Rate/Rhythm: 88 Afib  BP:  Sitting: 91/59      SaO2: 96 RA  MODE:  Ambulation: 800 ft   POST:  Rate/Rhythm: 102 Afib  BP:  Sitting: 112/70      SaO2: 96 RA  Pt tolerated exercise well and amb 800 ft independently. Pt went outside to enjoy fresh air and sat on bench for about 10 min. Pt to room after enjoying fresh air. Pt in recliner w/ call bell in reach. Reviewed education w/ pt. Pt is eager to see Education officer, museum. [place], MS, ACM-CEP 11/01/2020 1:27 PM

## 2020-11-01 NOTE — Progress Notes (Addendum)
[address]       [place] [address]             [phone]                               9 Day Post Op Wound VAC change and placement of Myriad Morcells 29 Days CABG x 4, LA clip, ASD repair with patch 14 Days Post Op I and D sternal wound, VAC placement     Subjective: Patient without specific complaint this am.   Objective: Vital signs in last 24 hours: Temp:  [97.8 F (36.6 C)-98.3 F (36.8 C)] 98 F (36.7 C) (07/06 0300) Pulse Rate:  [68-101] 68 (07/06 0300) Cardiac Rhythm: Junctional rhythm (07/05 1909) Resp:  [14-17] 17 (07/06 0300) BP: (99-118)/(62-68) 99/62 (07/06 0300) SpO2:  [94 %-98 %] 96 % (07/06 0300) Weight:  [128.2 kg] 128.2 kg (07/06 0300)   Pre op weight 131.6 kg    Current Weight  10/23/20 128.2 kg      Intake/Output from previous day: 07/05 0701 - 07/06 0700 In: 917 [P.O.:917] Out: 1270 [Urine:1250; Drains:20]     Physical Exam:   Cardiovascular:Slightly tachy this am, murmur heard best along sternal border Pulmonary: Clear on the right and diminished left base Abdomen: Soft, non tender, bowel sounds present. Extremities: Bilateral feet edema;(has decreased over last week). Both LEs have trace edema, venous stasis changes Wounds: Right LE wounds are clean and dry.  No erythema or signs of infection RLE wound. Wound VAC in place over lower sternal wound.  Lab Results: CBC:  Recent Labs (last 2 labs)   No results for input(s): WBC, HGB, HCT, PLT in the last 72 hours.    BMET:  BMP Latest Ref Rng & Units 10/28/2020 10/26/2020 10/25/2020  Glucose 70 - 99 mg/dL 122(H) 105(H) 130(H)  BUN 6 - 20 mg/dL 43(H) 40(H) 47(H)  Creatinine 0.61 - 1.24 mg/dL 1.55(H) 1.53(H) 1.94(H)  BUN/Creat Ratio 9 - 20 - - -  Sodium 135 - 145 mmol/L 132(L) 131(L) 128(L)  Potassium 3.5 - 5.1 mmol/L 4.7 4.6 4.4  Chloride 98 - 111 mmol/L 94(L) 96(L) 91(L)  CO2 22 - 32 mmol/L _0 Calcium 8.9 - 10.3 mg/dL 9.0 9.0 8.8(L)       ABG:   INR: Will add last result for INR, ABG once components are confirmed Will add last 4 CBG results once components are confirmed   Assessment/Plan:   1. CV - A flutter;history of a fib/fl. Had LA clip placed at the time of surgery.  On Amiodarone 200 mg bid,  Lopressor 12.5 mg bid, and Rivaroxaban 20 mg at supper.  2.  Pulmonary - On room air. History of OSA-on CPAP at night. S/p left thoracentesis. Encourage incentive spirometer 3. Acute on chronic heart failure- Previously on Torsemide 20 mg daily 4.  Expected post op acute blood loss anemia - H and H this am decreased to 7.6 and 24.3. Continue Trinsicon 6. AKI- Creatinine this am remains 1.55. Creatinine upon admission 1.22 .He is not on ACE/ARB.  7. Hyponatremia-has had post op. Last sodium increased to 131 8. Last WBC decreased from 11,500 to 8000. Blood culture shows Serratia Marcescens. Repeat BC shows no growth 5 days 9. Patient has PICC line. Continue Ertapenem. Last day of IV antibiotic 12/03/2020. Wound VAC to be changed on Monday s and Thursdays 10. The plan has been  for patient to go to SNF once bed available. Insurance has now denied SNF. Will try peer to peer appeal later today. If denied, brother to come to [facility], home health to be arranged for IV antibiotics, wound vac, and discharge home  Delvon Chipps M ZimmermanPA-C 10/23/2020,7:12 AM

## 2020-11-02 LAB — BASIC METABOLIC PANEL
Anion gap: 9 (ref 5–15)
BUN: 44 mg/dL — ABNORMAL HIGH (ref 6–20)
CO2: 29 mmol/L (ref 22–32)
Calcium: 9.1 mg/dL (ref 8.9–10.3)
Chloride: 95 mmol/L — ABNORMAL LOW (ref 98–111)
Creatinine, Ser: 1.49 mg/dL — ABNORMAL HIGH (ref 0.61–1.24)
GFR, Estimated: 53 mL/min — ABNORMAL LOW (ref >60)
Glucose, Bld: 103 mg/dL — ABNORMAL HIGH (ref 70–99)
Potassium: 4.6 mmol/L (ref 3.5–5.1)
Sodium: 133 mmol/L — ABNORMAL LOW (ref 135–145)

## 2020-11-02 NOTE — Progress Notes (Addendum)
[address]       [place] [address]             [phone]      10 Days Post-Op Procedure(s) (LRB): WOUND VAC CHANGE with APPLICATION OF MYRIAD MORCELLS (N/A) Subjective: Feels fine  Objective: Vital signs in last 24 hours: Temp:  [97.3 F (36.3 C)-98.7 F (37.1 C)] 97.3 F (36.3 C) (07/16 0714) Pulse Rate:  [73-105] 88 (07/16 0714) Cardiac Rhythm: Atrial flutter (07/16 0247) Resp:  [16-19] 18 (07/16 0714) BP: (91-121)/(59-72) 121/70 (07/16 0714) SpO2:  [96 %-98 %] 98 % (07/16 0714) Weight:  [123.5 kg] 123.5 kg (07/16 0300)  Hemodynamic parameters for last 24 hours:    Intake/Output from previous day: 07/15 0701 - 07/16 0700 In: 1330 [P.O.:1330] Out: 2990 [Urine:2990] Intake/Output this shift: No intake/output data recorded.  General appearance: alert, cooperative, and no distress Heart: regular rate and rhythm Lungs: mildly dim left base Abdomen: benign Extremities: + edema, conts to improve Wound: vac in place, no surrounding rethema, sero-sang drainage   Lab Results: No results for input(s): WBC, HGB, HCT, PLT in the last 72 hours. BMET:  Recent Labs    11/02/20 0500  NA 133*  K 4.6  CL 95*  CO2 29  GLUCOSE 103*  BUN 44*  CREATININE 1.49*  CALCIUM 9.1    PT/INR: No results for input(s): LABPROT, INR in the last 72 hours. ABG    Component Value Date/Time   PHART 7.280 (L) 10/03/2020 2146   HCO3 20.5 10/03/2020 2146   TCO2 22 10/03/2020 2146   ACIDBASEDEF 6.0 (H) 10/03/2020 2146   O2SAT 97.0 10/03/2020 2146   CBG (last 3)  No results for input(s): GLUCAP in the last 72 hours.  Meds Scheduled Meds:  allopurinol  300 mg Oral Daily   amiodarone  200 mg Oral BID   aspirin EC  81 mg Oral Daily   atorvastatin  80 mg Oral Daily   Chlorhexidine Gluconate Cloth  6 each Topical Daily   colchicine  0.3 mg Oral Daily   feeding supplement  237 mL Oral BID BM   ferrous [id number] C-folic acid  1 capsule Oral BID PC    guaiFENesin  1,200 mg Oral BID   metoprolol tartrate  12.5 mg Oral BID   pantoprazole  40 mg Oral Daily   polyethylene glycol  17 g Oral Daily   potassium chloride  20 mEq Oral Daily   rivaroxaban  20 mg Oral Q supper   sodium chloride flush  10-40 mL Intracatheter Q12H   torsemide  20 mg Oral Daily   Continuous Infusions:  sodium chloride     sodium chloride 20 mL/hr at 10/03/20 1507   ertapenem 1,000 mg (11/01/20 0953)   PRN Meds:.acetaminophen, dextrose, fluticasone, ipratropium-albuterol, lidocaine, metoprolol tartrate, ondansetron (ZOFRAN) IV, oxyCODONE, simethicone, sodium chloride flush, sodium chloride flush, sodium chloride flush, zolpidem  Xrays No results found.  Assessment/Plan: S/P Procedure(s) (LRB): WOUND VAC CHANGE with APPLICATION OF MYRIAD MORCELLS (N/A)  1 afeb, VSS s BP 91-121 2 sats good on RA 3 creat stable, slightly improved , 1.49, BUN 44- not on diuretics 4 per Dr Orvan Seen, wound is 5 cm long, 3 cm deep, 1 cm wide. 5 expedited appeal unsuccessful, hopefully all will be in place for [facility] by monday      LOS: 33 days    Matthew Giovanni PA-C Pager [phone] 11/02/2020    Agree with above. Continue wound care  and antibiotics. Dispo planning.  Shealee Yordy Bary Leriche

## 2020-11-02 NOTE — Progress Notes (Signed)
CARDIAC REHAB PHASE I   PRE:  Rate/Rhythm: 73 A-fib  BP:  Supine:   Sitting: 124/76     SaO2: 95% RA  MODE:  Ambulation: 800 ft   POST:  Rate/Rhythm: 102 A-fib  BP:  Supine:   Sitting: 127/68    SaO2: 97% RA  Pt seated in recliner and was independent getting out of bed using sternal precautions. Pt was eager to walk outside. He ambulated 837ft and tolerated well with no complaints or concerns. We sat outside on the bench for 12 minutes. Pt enjoys getting fresh air. Encouraged continued ambulation. Returned pt seated in recliner with call bell in reach.  [phone]  Rick Duff, MS, ACSM-CEP 11/02/2020 10:49 AM

## 2020-11-03 NOTE — Progress Notes (Addendum)
[address]       [place] [address]             [phone]      11 Days Post-Op Procedure(s) (LRB): WOUND VAC CHANGE with APPLICATION OF MYRIAD MORCELLS (N/A) Subjective: No acute changes/issues  Objective: Vital signs in last 24 hours: Temp:  [97.6 F (36.4 C)-98.4 F (36.9 C)] 97.7 F (36.5 C) (07/17 0722) Pulse Rate:  [68-99] 99 (07/17 0722) Cardiac Rhythm: Atrial flutter (07/17 0700) Resp:  [18-20] 18 (07/17 0722) BP: (106-127)/(56-80) 119/65 (07/17 0722) SpO2:  [92 %-96 %] 96 % (07/17 0722) Weight:  [784 kg] 123 kg (07/17 0500)  Hemodynamic parameters for last 24 hours:    Intake/Output from previous day: 07/16 0701 - 07/17 0700 In: 1210 [P.O.:1200; I.V.:10] Out: 3700 [Urine:3700] Intake/Output this shift: No intake/output data recorded.  General appearance: alert, cooperative, and no distress Heart: regular rate and rhythm Lungs: mildly dim left base Abdomen: benign Extremities: minor edema Wound: VAC in place  Lab Results: No results for input(s): WBC, HGB, HCT, PLT in the last 72 hours. BMET:  Recent Labs    11/02/20 0500  NA 133*  K 4.6  CL 95*  CO2 29  GLUCOSE 103*  BUN 44*  CREATININE 1.49*  CALCIUM 9.1    PT/INR: No results for input(s): LABPROT, INR in the last 72 hours. ABG    Component Value Date/Time   PHART 7.280 (L) 10/03/2020 2146   HCO3 20.5 10/03/2020 2146   TCO2 22 10/03/2020 2146   ACIDBASEDEF 6.0 (H) 10/03/2020 2146   O2SAT 97.0 10/03/2020 2146   CBG (last 3)  No results for input(s): GLUCAP in the last 72 hours.  Meds Scheduled Meds:  allopurinol  300 mg Oral Daily   amiodarone  200 mg Oral BID   aspirin EC  81 mg Oral Daily   atorvastatin  80 mg Oral Daily   Chlorhexidine Gluconate Cloth  6 each Topical Daily   colchicine  0.3 mg Oral Daily   feeding supplement  237 mL Oral BID BM   ferrous [id number] C-folic acid  1 capsule Oral BID PC   guaiFENesin  1,200 mg Oral BID    metoprolol tartrate  12.5 mg Oral BID   pantoprazole  40 mg Oral Daily   polyethylene glycol  17 g Oral Daily   potassium chloride  20 mEq Oral Daily   rivaroxaban  20 mg Oral Q supper   sodium chloride flush  10-40 mL Intracatheter Q12H   torsemide  20 mg Oral Daily   Continuous Infusions:  sodium chloride     sodium chloride 20 mL/hr at 10/03/20 1507   ertapenem 1,000 mg (11/03/20 0910)   PRN Meds:.acetaminophen, dextrose, fluticasone, ipratropium-albuterol, lidocaine, metoprolol tartrate, ondansetron (ZOFRAN) IV, oxyCODONE, simethicone, sodium chloride flush, sodium chloride flush, sodium chloride flush, zolpidem  Xrays No results found.  Assessment/Plan: S/P Procedure(s) (LRB): WOUND VAC CHANGE with APPLICATION OF MYRIAD MORCELLS (N/A)    1 afeb, VSS, small amount of afib  with CVR- on amio , currently sinus tachy low 100's-also metoprolol and rivaroxiban 2 sats good on RA 3 excellent UOP- no diuretics currently 4 no new labs 5 VAC to be changed tomorrow, site looks ok  6 conts ertapenem as outlined 7 home with [facility] when arrangements final     LOS: 34 days    John Giovanni PA-C Pager [phone] 11/03/2020    Agree with above Dispo planning and  wound care  [place]

## 2020-11-04 NOTE — Progress Notes (Signed)
CARDIAC REHAB PHASE I   PRE:  Rate/Rhythm: 73 SR  BP:  Sitting: 116/76      SaO2: 97 RA  MODE:  Ambulation: 800 ft   POST:  Rate/Rhythm: 109 ST PVCs  BP:  Sitting: 123/74      SaO2: 99 RA  Pt tolerated exercise well and amb 800 ft independently. I pushed IV pole. Went outside and sat on bench for 20 min. Pt is waiting vac machine for d/c. Pt still frustrated he can't go home. Pt to recliner w/ call bell in reach. [phone]  Sheppard Plumber, MS, ACM-CEP 11/04/2020 11:54 AM

## 2020-11-04 NOTE — Progress Notes (Signed)
[address]       [place] [address]             [phone]                               12 Day Post Op Wound VAC change and placement of Myriad Morcells 32 Days CABG x 4, LA clip, ASD repair with patch 17 Days Post Op I and D sternal wound, VAC placement     Subjective: Patient without specific complaint this am;he wants to go home. His brother Cristie Hem is here   Objective: Vital signs in last 24 hours: Temp:  [97.8 F (36.6 C)-98.3 F (36.8 C)] 98 F (36.7 C) (07/06 0300) Pulse Rate:  [68-101] 68 (07/06 0300) Cardiac Rhythm: Junctional rhythm (07/05 1909) Resp:  [14-17] 17 (07/06 0300) BP: (99-118)/(62-68) 99/62 (07/06 0300) SpO2:  [94 %-98 %] 96 % (07/06 0300) Weight:  [128.2 kg] 128.2 kg (07/06 0300)   Pre op weight 131.6 kg    Current Weight  10/23/20 128.2 kg      Intake/Output from previous day: 07/05 0701 - 07/06 0700 In: 917 [P.O.:917] Out: 1270 [Urine:1250; Drains:20]     Physical Exam:   Cardiovascular:Slightly tachy this am, murmur heard best along sternal border Pulmonary: Clear on the right and diminished left base Abdomen: Soft, non tender, bowel sounds present. Extremities: Very mild bilateral feet edema; Both LEs have trace edema, venous stasis changes Wounds: Right LE wounds are clean and dry.  No erythema or signs of infection RLE wound. Wound VAC in place over lower sternal wound.  Lab Results: CBC:  Recent Labs (last 2 labs)   No results for input(s): WBC, HGB, HCT, PLT in the last 72 hours.    BMET:  BMP Latest Ref Rng & Units 11/02/2020 10/28/2020 10/26/2020  Glucose 70 - 99 mg/dL 103(H) 122(H) 105(H)  BUN 6 - 20 mg/dL 44(H) 43(H) 40(H)  Creatinine 0.61 - 1.24 mg/dL 1.49(H) 1.55(H) 1.53(H)  BUN/Creat Ratio 9 - 20 - - -  Sodium 135 - 145 mmol/L 133(L) 132(L) 131(L)  Potassium 3.5 - 5.1 mmol/L 4.6 4.7 4.6  Chloride 98 - 111 mmol/L 95(L) 94(L) 96(L)  CO2 22 - 32 mmol/L '29 29 28  ' Calcium 8.9 - 10.3 mg/dL 9.1 9.0 9.0        ABG:  INR: Will add last result for INR, ABG once components are confirmed Will add last 4 CBG results once components are confirmed   Assessment/Plan:   1. CV - A flutter;history of a fib/fl. Had LA clip placed at the time of surgery.  On Amiodarone 200 mg bid,  Lopressor 12.5 mg bid, and Rivaroxaban 20 mg at supper.  2.  Pulmonary - On room air. History of OSA-on CPAP at night. S/p left thoracentesis. Encourage incentive spirometer 3. Acute on chronic heart failure- Previously on Torsemide 20 mg daily 4.  Expected post op acute blood loss anemia - H and H this am decreased to 7.6 and 24.3. Continue Trinsicon 6. AKI- Last Creatinine decreased to 1.49. Creatinine upon admission 1.22 .He is not on ACE/ARB.  7. Hyponatremia-has had post op. Last sodium increased to 133 8. Last WBC decreased from 11,500 to 8000. Blood culture shows Serratia Marcescens. Repeat BC shows no growth 5 days 9. Patient has PICC line. Continue Ertapenem. Last day of IV antibiotic 12/03/2020. Wound VAC to be changed this am (as  to be changed on Mondays and Thursdays until instructed otherwise 10. Disposition: brother already arrived in [place], home health to be arranged for IV antibiotics, wound vac, and discharge home  [place] 10/23/2020,7:12 AM

## 2020-11-04 NOTE — Progress Notes (Signed)
      [address]       [place] [address]             [phone]    Progression update:     Wound vac changes will be done in the office on Monday and Friday  (office notified) and there will be a home health nurse who does his wound vac changes on Wed.   Wound measurements per Dr. Orvan Seen are: 3 cm deep, 1/2 cm wide, and 4 cm in length.  Case management still working on setting up the IV antibiotics and will likely be discharged tomorrow vs. Today.  Nicholes Rough, PA-C

## 2020-11-04 NOTE — TOC Progression Note (Signed)
Transition of Care [facility]) - Progression Note    Patient Details  Name: Matthew Savage MRN: [id number] Date of Birth: [birth year]  Transition of Care [facility]) CM/SW Contact  Zenon Mayo, RN Phone Number: 11/04/2020, 4:41 PM  Clinical Narrative:    NCM spoke with Leana Roe with [employer], she states we should have the wound vac by tomorrow.  Also Ethelda Chick will be doing iv medication teaching tomorrow,the patient's brother can be there also if he wants to.  Patient will receive the iv abx here at the hospital on 7/19, after he receives that ,he will be discharged and the [facility] RN will go out to see him on Wed to do the iv abx picc care and wound vac change.  The [facility] will do the wound vac change every Wed.  The Patient will go to the office to get the wound vac change on every Monday and Friday.  This information was explained to the patient and to the brother.    Expected Discharge Plan: [place] Barriers to Discharge: Continued Medical Work up  Expected Discharge Plan and Services Expected Discharge Plan: [place] In-house Referral: Clinical Social Work Discharge Planning Services: NA Post Acute Care Choice: [place] arrangements for the past 2 months: Single Family Home                                       Social Determinants of Health (SDOH) Interventions    Readmission Risk Interventions No flowsheet data found.

## 2020-11-04 NOTE — TOC Progression Note (Addendum)
Transition of Care [facility]) - Progression Note    Patient Details  Name: Matthew Savage MRN: [id number] Date of Birth: July 21, 1960  Transition of Care [facility]) CM/SW Contact  Zenon Mayo, RN Phone Number: 11/04/2020, 10:08 AM  Clinical Narrative:    NCM received call from [facility] with [employer], she states the escript for the [facility] was not sent like she thought it was, they will send it to [facility] to be signed so it can be ordered.   Expected Discharge Plan: [place] Barriers to Discharge: Continued Medical Work up  Expected Discharge Plan and Services Expected Discharge Plan: [place] In-house Referral: Clinical Social Work Discharge Planning Services: NA Post Acute Care Choice: [place] arrangements for the past 2 months: Single Family Home                                       Social Determinants of Health (SDOH) Interventions    Readmission Risk Interventions No flowsheet data found.

## 2020-11-05 MED ORDER — HEPARIN SOD (PORK) LOCK FLUSH 100 UNIT/ML IV SOLN
250.0000 [IU] | INTRAVENOUS | Status: AC | PRN
  Administered 2020-11-05: 250 [IU]
  Filled 2020-11-05: qty 2.5

## 2020-11-05 NOTE — TOC Progression Note (Signed)
Transition of Care [facility]) - Progression Note    Patient Details  Name: KENTRAVIOUS LIPFORD MRN: [id number] Date of Birth: Jan 28, 1961  Transition of Care [facility]) CM/SW Contact  Zenon Mayo, RN Phone Number: 11/05/2020, 9:55 AM  Clinical Narrative:    Olivia Mackie with [employer] states wound vac is in route we should receive today.  NCM informed patient of this information.   Expected Discharge Plan: [place] Barriers to Discharge: Continued Medical Work up  Expected Discharge Plan and Services Expected Discharge Plan: [place] In-house Referral: Clinical Social Work Discharge Planning Services: NA Post Acute Care Choice: [place] arrangements for the past 2 months: Single Family Home                                       Social Determinants of Health (SDOH) Interventions    Readmission Risk Interventions No flowsheet data found.

## 2020-11-05 NOTE — Progress Notes (Signed)
Wound vac machine delivered  and old machine changed to a new one. Instructions given to pt. Discharged instructions given to pt.

## 2020-11-05 NOTE — Progress Notes (Signed)
Discharged home accompanied by  his brother. Belongings taken home.

## 2020-11-05 NOTE — Progress Notes (Addendum)
[address]       [place] [address]             [phone]                               13 Day Post Op Wound VAC change and placement of Myriad Morcells 33  Days CABG x 4, LA clip, ASD repair with patch 18 Days Post Op I and D sternal wound, VAC placement     Subjective: Patient slept well with a sleeping pill. He wants to go home.   Objective: Vital signs in last 24 hours: Temp:  [97.8 F (36.6 C)-98.3 F (36.8 C)] 98 F (36.7 C) (07/06 0300) Pulse Rate:  [68-101] 68 (07/06 0300) Cardiac Rhythm: Junctional rhythm (07/05 1909) Resp:  [14-17] 17 (07/06 0300) BP: (99-118)/(62-68) 99/62 (07/06 0300) SpO2:  [94 %-98 %] 96 % (07/06 0300) Weight:  [128.2 kg] 128.2 kg (07/06 0300)   Pre op weight 131.6 kg    Current Weight  10/23/20 128.2 kg      Intake/Output from previous day: 07/05 0701 - 07/06 0700 In: 917 [P.O.:917] Out: 1270 [Urine:1250; Drains:20]     Physical Exam:   Cardiovascular:RRR, murmur heard best along sternal border Pulmonary: Clear on the right and diminished left base Abdomen: Soft, non tender, bowel sounds present. Extremities: Trace bilateral feet edema; Both LEs have trace edema, venous stasis changes Wounds: Right LE wounds are clean and dry.  No erythema or signs of infection RLE wound. Wound VAC in place over lower sternal wound.  Lab Results: CBC:  Recent Labs (last 2 labs)   No results for input(s): WBC, HGB, HCT, PLT in the last 72 hours.    BMET:  BMP Latest Ref Rng & Units 11/02/2020 10/28/2020 10/26/2020  Glucose 70 - 99 mg/dL 103(H) 122(H) 105(H)  BUN 6 - 20 mg/dL 44(H) 43(H) 40(H)  Creatinine 0.61 - 1.24 mg/dL 1.49(H) 1.55(H) 1.53(H)  BUN/Creat Ratio 9 - 20 - - -  Sodium 135 - 145 mmol/L 133(L) 132(L) 131(L)  Potassium 3.5 - 5.1 mmol/L 4.6 4.7 4.6  Chloride 98 - 111 mmol/L 95(L) 94(L) 96(L)  CO2 22 - 32 mmol/L _0 Calcium 8.9 - 10.3 mg/dL 9.1 9.0 9.0       ABG:  INR: Will add last result for  INR, ABG once components are confirmed Will add last 4 CBG results once components are confirmed   Assessment/Plan:   1. CV - A flutter;history of a fib/fl. Had LA clip placed at the time of surgery.  On Amiodarone 200 mg bid,  Lopressor 12.5 mg bid, and Rivaroxaban 20 mg at supper.  2.  Pulmonary - On room air. History of OSA-on CPAP at night. Previous left thoracentesis. Encourage incentive spirometer 3. Acute on chronic heart failure- Previously on Torsemide 20 mg daily 4.  Expected post op acute blood loss anemia - Last H and H decreased to 7.6 and 24.3. Continue Trinsicon 6. AKI- Last Creatinine decreased to 1.49. Creatinine upon admission 1.22 .He is not on ACE/ARB.  7. Hyponatremia-has had post op. Last sodium increased to 133 8. Last WBC decreased from 11,500 to 8000. Blood culture shows Serratia Marcescens. Repeat BC shows no growth 5 days 9. Patient has PICC line. Continue Ertapenem. Last day of IV antibiotic 12/03/2020. Wound VAC to be changed changed on Mondays and Thursdays until instructed otherwise. Will finalize  with my office this am. 10. Disposition: brother already arrived in [place], home health to be arranged for IV antibiotics, wound vac, and discharge home. Hopefully, this has all been finalized today  Carma Dwiggins M ZimmermanPA-C 10/23/2020,7:12 AM

## 2020-11-05 NOTE — TOC Transition Note (Signed)
Transition of Care [facility]) - CM/SW Discharge Note   Patient Details  Name: Matthew Savage MRN: [id number] Date of Birth: 1960/05/18  Transition of Care [facility]) CM/SW Contact:  Zenon Mayo, RN Phone Number: 11/05/2020, 12:30 PM   Clinical Narrative:    Patient is for dc today, Carolynn Sayers to do iv abx teaching with patient.  The wound vac is in routed to the hospital.  NCM informed Ebony Hail with [employer] who is supplying the [facility] for picc line care that patient will go to the office to have the wound vac change done.  The only thing the [facility] will do is the picc line care.     Final next level of care: Home w Home Health Services Barriers to Discharge: No Barriers Identified   Patient Goals and CMS Choice Patient states their goals for this hospitalization and ongoing recovery are:: to return home with [facility] and brother to be with hime CMS Medicare.gov Compare Post Acute Care list provided to:: Patient Choice offered to / list presented to : Patient  Discharge Placement                       Discharge Plan and Services In-house Referral: Clinical Social Work Discharge Planning Services: NA Post Acute Care Choice: Home Health          DME Arranged: Vac DME Agency: [employer] Date DME Agency Contacted: 10/31/20 Time DME Agency Contacted: 1600 Representative spoke with at DME Agency: [place]: RN [place] Agency:  Merchant navy officer) Date [place]: 11/04/20 Time [place]: 1000 Representative spoke with at [place]: [place] ([place]) Interventions     Readmission Risk Interventions No flowsheet data found.

## 2020-11-05 NOTE — Progress Notes (Signed)
CSW received a call from the patients brother, Jaiceon Collister [phone] asking where the drain pump for the wound vac is and that it was supposed to arrive over the weekend and it still isn't there. CSW passed this message along to the [facility] to follow up as CSW is just returning to work and HF team has signed off on the patient.  Aurelie Dicenzo, MSW, [place] Heart Failure Social Worker

## 2020-11-05 NOTE — Progress Notes (Signed)
CARDIAC REHAB PHASE I   PRE:  Rate/Rhythm: 79 SR  BP:  Sitting: 112/59      SaO2: 96 RA  MODE:  Ambulation: 800 ft   POST:  Rate/Rhythm: 105 ST PVCs  BP:  Sitting: 127/82      SaO2: 98 RA  Pt tolerated exercise well and amb 800 ft independently. I pushed IV pole. Went outside and sat on bench for 20 min. Pt to recliner w/ call bell in reach. Pt is still frustrated he has not received vac machine yet to go home.  [phone]  Sheppard Plumber, MS, ACM-CEP 11/05/2020 11:06 AM

## 2020-11-06 LAB — ECHO INTRAOPERATIVE TEE
AV Mean grad: 17 mmHg
Height: 75 in
Weight: 4641.6 oz

## 2020-11-07 ENCOUNTER — Ambulatory Visit: Payer: BC Managed Care – PPO | Admitting: Cardiothoracic Surgery

## 2020-11-07 ENCOUNTER — Ambulatory Visit (INDEPENDENT_AMBULATORY_CARE_PROVIDER_SITE_OTHER): Payer: Self-pay | Admitting: Cardiothoracic Surgery

## 2020-11-07 ENCOUNTER — Other Ambulatory Visit: Payer: Self-pay

## 2020-11-07 VITALS — BP 113/80 | HR 82 | Resp 20 | Ht 75.0 in | Wt 270.0 lb

## 2020-11-07 DIAGNOSIS — Z951 Presence of aortocoronary bypass graft: Secondary | ICD-10-CM

## 2020-11-07 DIAGNOSIS — Z4689 Encounter for fitting and adjustment of other specified devices: Secondary | ICD-10-CM

## 2020-11-07 NOTE — Progress Notes (Signed)
Patient returns for wound VAC change.  He is status post CABG from several weeks ago.  He has a small inferiorly oriented defect of the sternum.  He has been home for a couple of days and has no complaints  Physical exam:  BP 113/80   Pulse 82   Resp 20   Ht 6\' 3"  (1.905 m)   Wt 122.5 kg   BMI 33.75 kg/m  Well-appearing no acute distress Regular rate and rhythm Clear to auscultation Wound VAC is changed at the bedside and wound healing gel was applied to the base.  The defect continues to measure approximately 5 cm in length and 2 cm in width and 3 cm in depth  Assessment/Plan: Continue wound VAC therapy Encourage good p.o. intake Suggest multivitamin with zinc  Aquita Simmering Z. Orvan Seen, [place]

## 2020-11-08 ENCOUNTER — Encounter (HOSPITAL_COMMUNITY): Payer: Self-pay

## 2020-11-08 ENCOUNTER — Other Ambulatory Visit (HOSPITAL_COMMUNITY): Payer: Self-pay | Admitting: Family Medicine

## 2020-11-08 ENCOUNTER — Ambulatory Visit (HOSPITAL_COMMUNITY)
Admit: 2020-11-08 | Discharge: 2020-11-08 | Disposition: A | Discharge status: Home / Self Care | Payer: BC Managed Care – PPO | Source: Ambulatory Visit | Attending: Family Medicine | Admitting: Family Medicine

## 2020-11-08 VITALS — BP 118/70 | HR 76 | Wt 270.6 lb

## 2020-11-08 DIAGNOSIS — I4892 Unspecified atrial flutter: Secondary | ICD-10-CM | POA: Diagnosis not present

## 2020-11-08 DIAGNOSIS — I5042 Chronic combined systolic (congestive) and diastolic (congestive) heart failure: Secondary | ICD-10-CM | POA: Diagnosis not present

## 2020-11-08 DIAGNOSIS — Z9884 Bariatric surgery status: Secondary | ICD-10-CM | POA: Insufficient documentation

## 2020-11-08 DIAGNOSIS — I82532 Chronic embolism and thrombosis of left popliteal vein: Secondary | ICD-10-CM | POA: Insufficient documentation

## 2020-11-08 DIAGNOSIS — T8149XA Infection following a procedure, other surgical site, initial encounter: Secondary | ICD-10-CM | POA: Insufficient documentation

## 2020-11-08 DIAGNOSIS — L089 Local infection of the skin and subcutaneous tissue, unspecified: Secondary | ICD-10-CM

## 2020-11-08 DIAGNOSIS — Z8249 Family history of ischemic heart disease and other diseases of the circulatory system: Secondary | ICD-10-CM | POA: Diagnosis not present

## 2020-11-08 DIAGNOSIS — Z87891 Personal history of nicotine dependence: Secondary | ICD-10-CM | POA: Diagnosis not present

## 2020-11-08 DIAGNOSIS — I110 Hypertensive heart disease with heart failure: Secondary | ICD-10-CM | POA: Insufficient documentation

## 2020-11-08 DIAGNOSIS — S21101A Unspecified open wound of right front wall of thorax without penetration into thoracic cavity, initial encounter: Secondary | ICD-10-CM

## 2020-11-08 DIAGNOSIS — I252 Old myocardial infarction: Secondary | ICD-10-CM | POA: Diagnosis not present

## 2020-11-08 DIAGNOSIS — R600 Localized edema: Secondary | ICD-10-CM | POA: Diagnosis not present

## 2020-11-08 DIAGNOSIS — I483 Typical atrial flutter: Secondary | ICD-10-CM

## 2020-11-08 DIAGNOSIS — I359 Nonrheumatic aortic valve disorder, unspecified: Secondary | ICD-10-CM | POA: Diagnosis not present

## 2020-11-08 DIAGNOSIS — Z7901 Long term (current) use of anticoagulants: Secondary | ICD-10-CM | POA: Diagnosis not present

## 2020-11-08 DIAGNOSIS — Q211 Atrial septal defect, unspecified: Secondary | ICD-10-CM

## 2020-11-08 DIAGNOSIS — G4733 Obstructive sleep apnea (adult) (pediatric): Secondary | ICD-10-CM | POA: Insufficient documentation

## 2020-11-08 DIAGNOSIS — I10 Essential (primary) hypertension: Secondary | ICD-10-CM | POA: Diagnosis not present

## 2020-11-08 DIAGNOSIS — I2510 Atherosclerotic heart disease of native coronary artery without angina pectoris: Secondary | ICD-10-CM | POA: Diagnosis not present

## 2020-11-08 DIAGNOSIS — Z7982 Long term (current) use of aspirin: Secondary | ICD-10-CM | POA: Insufficient documentation

## 2020-11-08 DIAGNOSIS — Z951 Presence of aortocoronary bypass graft: Secondary | ICD-10-CM | POA: Insufficient documentation

## 2020-11-08 DIAGNOSIS — R7881 Bacteremia: Secondary | ICD-10-CM | POA: Diagnosis not present

## 2020-11-08 DIAGNOSIS — I350 Nonrheumatic aortic (valve) stenosis: Secondary | ICD-10-CM | POA: Insufficient documentation

## 2020-11-08 DIAGNOSIS — Z881 Allergy status to other antibiotic agents status: Secondary | ICD-10-CM | POA: Insufficient documentation

## 2020-11-08 DIAGNOSIS — Z86718 Personal history of other venous thrombosis and embolism: Secondary | ICD-10-CM

## 2020-11-08 DIAGNOSIS — Z79899 Other long term (current) drug therapy: Secondary | ICD-10-CM | POA: Diagnosis not present

## 2020-11-08 DIAGNOSIS — I499 Cardiac arrhythmia, unspecified: Secondary | ICD-10-CM | POA: Insufficient documentation

## 2020-11-08 LAB — BASIC METABOLIC PANEL
Anion gap: 9 (ref 5–15)
BUN: 42 mg/dL — ABNORMAL HIGH (ref 6–20)
CO2: 26 mmol/L (ref 22–32)
Calcium: 9 mg/dL (ref 8.9–10.3)
Chloride: 99 mmol/L (ref 98–111)
Creatinine, Ser: 1.63 mg/dL — ABNORMAL HIGH (ref 0.61–1.24)
GFR, Estimated: 48 mL/min — ABNORMAL LOW (ref >60)
Glucose, Bld: 109 mg/dL — ABNORMAL HIGH (ref 70–99)
Potassium: 4.2 mmol/L (ref 3.5–5.1)
Sodium: 134 mmol/L — ABNORMAL LOW (ref 135–145)

## 2020-11-08 NOTE — Patient Instructions (Signed)
Labs done today. We will contact you only if your labs are abnormal.  No medication changes were made. Please continue all current medications as prescribed.  Your physician recommends that you schedule a follow-up appointment in: 3 months with Dr. Haroldine Laws   If you have any questions or concerns before your next appointment please send [place] a message through [facility] or call our office at [phone].    TO LEAVE A MESSAGE FOR THE NURSE SELECT OPTION 2, PLEASE LEAVE A MESSAGE INCLUDING: YOUR NAME DATE OF BIRTH CALL BACK NUMBER REASON FOR CALL**this is important as we prioritize the call backs  YOU WILL RECEIVE A CALL BACK THE SAME DAY AS LONG AS YOU CALL BEFORE 4:00 PM   Do the following things EVERYDAY: Weigh yourself in the morning before breakfast. Write it down and keep it in a log. Take your medicines as prescribed Eat low salt foods--Limit salt (sodium) to 2000 mg per day.  Stay as active as you can everyday Limit all fluids for the day to less than 2 liters   At the [facility], you and your health needs are our priority. As part of our continuing mission to provide you with exceptional heart care, we have created designated Provider Care Teams. These Care Teams include your primary Cardiologist (physician) and Advanced Practice Providers (APPs- Physician Assistants and Nurse Practitioners) who all work together to provide you with the care you need, when you need it.   You may see any of the following providers on your designated Care Team at your next follow up: Dr Glori Bickers Dr Haynes Kerns, NP Lyda Jester, [place] Audry Riles, PharmD   Please be sure to bring in all your medications bottles to every appointment.
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
## 2020-11-11 ENCOUNTER — Other Ambulatory Visit: Payer: Self-pay

## 2020-11-11 ENCOUNTER — Telehealth (HOSPITAL_COMMUNITY): Payer: Self-pay | Admitting: Family Medicine

## 2020-11-11 ENCOUNTER — Other Ambulatory Visit (HOSPITAL_COMMUNITY): Payer: Self-pay

## 2020-11-11 ENCOUNTER — Ambulatory Visit (INDEPENDENT_AMBULATORY_CARE_PROVIDER_SITE_OTHER): Payer: Self-pay

## 2020-11-11 DIAGNOSIS — Z4802 Encounter for removal of sutures: Secondary | ICD-10-CM

## 2020-11-11 DIAGNOSIS — I5042 Chronic combined systolic (congestive) and diastolic (congestive) heart failure: Secondary | ICD-10-CM

## 2020-11-11 DIAGNOSIS — Z4801 Encounter for change or removal of surgical wound dressing: Secondary | ICD-10-CM

## 2020-11-11 MED ORDER — DAPAGLIFLOZIN PROPANEDIOL 10 MG PO TABS: 10.0000 mg | ORAL_TABLET | Freq: Every day | ORAL | 3 refills | Status: DC

## 2020-11-11 NOTE — Progress Notes (Signed)
Patient arrived in clinic today for a wound vac change s/p CABG x4 with Dr. Orvan Seen 10/03/20 with sternal wound debridements and wound vac changes. Patient was discharged from the hospital last week and is coming to the clinic Monday's and Thursday's for wound vac changes.  Canister changed out and old dressing removed. 2 pieces of foam removed from wound bed as wound is deep. Good granulation tissue in wound bed. Wound measures approximately 5 cm in length x 3 cm in width x 3 cm in depth with tracking at 12 o'clock. 2 pieces of black sponge were placed in the wound bed as thickness of sponge does not cover the entire wound. Wound vac dressing applied and wound vac turned on. Good suction noted immediately with no alarming. Patient tolerated procedure well. Patient advised to keep dressing dry and intact until his follow-up with Dr. Orvan Seen this Thursday, 11/14/20. Patient acknowledged receipt.

## 2020-11-11 NOTE — Telephone Encounter (Signed)
Called patient to discuss starting Farxiga. LMTCB. If he is agreeable, will need BMET 10 days after starting medication. His co-pay for 30-day supply should be $0.  Allena Katz, FNP-BC

## 2020-11-11 NOTE — Addendum Note (Signed)
Addended by: Harvie Junior on: 11/11/2020 01:00 PM   Modules accepted: Orders

## 2020-11-11 NOTE — Telephone Encounter (Signed)
Pt aware and agreeable with plan. Lab appt scheduled. Medication sent to pharmacy.

## 2020-11-12 NOTE — Addendum Note (Signed)
Encounter addended by: Rafael Bihari, FNP on: 11/12/2020 2:11 AM  Actions taken: Clinical Note Signed

## 2020-11-14 ENCOUNTER — Other Ambulatory Visit: Payer: Self-pay

## 2020-11-14 ENCOUNTER — Encounter: Payer: Self-pay | Admitting: Cardiothoracic Surgery

## 2020-11-14 ENCOUNTER — Ambulatory Visit: Payer: Self-pay | Admitting: Cardiothoracic Surgery

## 2020-11-14 DIAGNOSIS — Z951 Presence of aortocoronary bypass graft: Secondary | ICD-10-CM

## 2020-11-15 ENCOUNTER — Encounter: Payer: Self-pay | Admitting: Adult Health

## 2020-11-15 ENCOUNTER — Ambulatory Visit (INDEPENDENT_AMBULATORY_CARE_PROVIDER_SITE_OTHER): Payer: BC Managed Care – PPO | Admitting: Adult Health

## 2020-11-15 VITALS — BP 120/80 | HR 107 | Temp 97.5°F | Ht 75.0 in | Wt 273.0 lb

## 2020-11-15 DIAGNOSIS — S0502XA Injury of conjunctiva and corneal abrasion without foreign body, left eye, initial encounter: Secondary | ICD-10-CM

## 2020-11-15 MED ORDER — ERYTHROMYCIN 5 MG/GM OP OINT: 1.0000 "application " | TOPICAL_OINTMENT | Freq: Three times a day (TID) | OPHTHALMIC | 1 refills | Status: AC

## 2020-11-15 NOTE — Progress Notes (Signed)
Subjective:    Patient ID: Matthew Savage, male    DOB: 1961-03-30, 60 y.o.   MRN: [id number]  HPI 60 year old male who  has a past medical history of Blood in urine, Class 2 obesity (09/30/2020), DDD (degenerative disc disease), DVT (deep venous thrombosis) ([place]) (2017 & 2018), Gout, Heart murmur, Hyperlipidemia, Hypertension, Numbness and tingling, and SCC (squamous cell carcinoma).  He presents to the office today for an acute issue of feeling of foreign body in his left eye x1 week.  Reports a gritty sensation in the feeling of "an eyelash or something in my eye".  Has had some clear drainage but no loss of vision.  No itchiness or matted eyelids. Does not wear contacts  He is recovering well status post CABG x4 complicated by a sternal wound infection.  Currently has wound VAC and getting antibiotics through PICC line.  Reports that he is feeling well and no pain, has not developed any fevers or chills   Review of Systems See HPI   Past Medical History:  Diagnosis Date   Blood in urine    Class 2 obesity 09/30/2020   DDD (degenerative disc disease)    DVT (deep venous thrombosis) ([place]) 2017 & 2018   Gout    Heart murmur    Hyperlipidemia    Hypertension    Numbness and tingling    right arm and hand   SCC (squamous cell carcinoma)    skin, squamous cell, face    Social History   Socioeconomic History   Marital status: Single    Spouse name: Not on file   Number of children: Not on file   Years of education: Not on file   Highest education level: Not on file  Occupational History   Not on file  Tobacco Use   Smoking status: Former    Packs/day: 2.00    Years: 36.00    Pack years: 72.00    Types: Cigarettes    Quit date: 01/01/2020    Years since quitting: 0.8   Smokeless tobacco: Never  Substance and Sexual Activity   Alcohol use: Yes    Alcohol/week: 12.0 standard drinks    Types: 12 Standard drinks or equivalent per week    Comment: 2 per day   Drug use: No    Sexual activity: Not on file  Other Topics Concern   Not on file  Social History Narrative   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    Married    No kids   Social Determinants of Health   Financial Resource Strain: Not on file  Food Insecurity: Not on file  Transportation Needs: Not on file  Physical Activity: Not on file  Stress: Not on file  Social Connections: Not on file  Intimate Partner Violence: Not on file    Past Surgical History:  Procedure Laterality Date   A-FLUTTER ABLATION N/A 03/11/2020   Procedure: A-FLUTTER ABLATION;  Surgeon: Deboraha Sprang, MD;  Location: [facility];  Service: Cardiovascular;  Laterality: N/A;   APPLICATION OF WOUND VAC  10/18/2020   Procedure: APPLICATION OF WOUND VAC;  Surgeon: Lajuana Matte, MD;  Location: [facility];  Service: Thoracic;;   ASD REPAIR N/A 10/03/2020   Procedure: ATRIAL SEPTAL DEFECT (ASD) REPAIR WITH PERI GUARD PERICARDIUM;  Surgeon: Wonda Olds, MD;  Location: [facility];  Service: Open Heart Surgery;  Laterality: N/A;   CLIPPING OF ATRIAL APPENDAGE Left 10/03/2020  Procedure: CLIPPING OF ATRIAL APPENDAGE WITH ATRICURE 43 CLIP;  Surgeon: Wonda Olds, MD;  Location: [facility];  Service: Open Heart Surgery;  Laterality: Left;   CORONARY ARTERY BYPASS GRAFT N/A 10/03/2020   Procedure: CORONARY ARTERY BYPASS GRAFTING (CABG) X FOUR, USING BILATERAL MAMMARY ARTERIES AND RIGHT ENDOSCOPIC GREATER SAPHENOUS VEIN CONDUITS;  Surgeon: Wonda Olds, MD;  Location: [facility];  Service: Open Heart Surgery;  Laterality: N/A;   HERNIA REPAIR  1967   X 2   IR THORACENTESIS ASP PLEURAL SPACE W/IMG GUIDE  10/28/2020   LAPAROSCOPIC GASTRIC BANDING  03/2009   in [place], [place] CATH AND CORONARY ANGIOGRAPHY N/A 10/01/2020   Procedure: LEFT HEART CATH AND CORONARY ANGIOGRAPHY;  Surgeon: Troy Sine, MD;  Location: [facility];  Service: Cardiovascular;  Laterality: N/A;   MASS EXCISION  12/03/2011   X 2  (gluteal) X 1 (lower back)   MOHS SURGERY     x 2   STERNAL WOUND DEBRIDEMENT N/A 10/18/2020   Procedure: STERNAL WOUND DEBRIDEMENT;  Surgeon: Lajuana Matte, MD;  Location: [facility];  Service: Thoracic;  Laterality: N/A;   STERNAL WOUND DEBRIDEMENT N/A 10/23/2020   Procedure: WOUND VAC CHANGE with APPLICATION OF MYRIAD MORCELLS;  Surgeon: Wonda Olds, MD;  Location: [facility];  Service: Thoracic;  Laterality: N/A;   TEE WITHOUT CARDIOVERSION N/A 10/03/2020   Procedure: TRANSESOPHAGEAL ECHOCARDIOGRAM (TEE);  Surgeon: Wonda Olds, MD;  Location: [facility];  Service: Open Heart Surgery;  Laterality: N/A;    Family History  Problem Relation Age of Onset   Breast cancer Mother    Other Mother        small cell carcinoma   Hypertension Father    Colon polyps Father    Colon polyps Brother     Allergies  Allergen Reactions   Levaquin [Levofloxacin]     Aortic Aneurysm    Current Outpatient Medications on File Prior to Visit  Medication Sig Dispense Refill   acetaminophen (TYLENOL) 325 MG tablet Take 2 tablets (650 mg total) by mouth every 6 (six) hours as needed for fever.     allopurinol (ZYLOPRIM) 300 MG tablet TAKE 1 TABLET BY MOUTH EVERY DAY 90 tablet 0   amiodarone (PACERONE) 200 MG tablet Take 1 tablet (200 mg total) by mouth daily. 30 tablet 1   aspirin EC 81 MG EC tablet Take 1 tablet (81 mg total) by mouth daily. Swallow whole. 30 tablet 11   atorvastatin (LIPITOR) 80 MG tablet Take 1 tablet (80 mg total) by mouth daily. 30 tablet 1   colchicine 0.6 MG tablet Take 0.6 mg by mouth daily as needed (Flair up gout).      dapagliflozin propanediol (FARXIGA) 10 MG TABS tablet Take 1 tablet (10 mg total) by mouth daily before breakfast. 30 tablet 3   ertapenem 1,000 mg in sodium chloride 0.9 % 100 mL Inject 1,000 mg into the vein daily. 1 g 32   ferrous [id number] C-folic acid (TRINSICON / FOLTRIN) capsule Take 1 capsule by mouth daily.     metoprolol succinate  (TOPROL-XL) 25 MG 24 hr tablet Take 1 tablet (25 mg total) by mouth daily. Take with or immediately following a meal. 30 tablet 1   oxyCODONE (OXY IR/ROXICODONE) 5 MG immediate release tablet Take 1 tablet (5 mg total) by mouth every 6 (six) hours as needed for severe pain. 24 tablet 0   rivaroxaban (XARELTO) 20 MG TABS tablet Take 1  tablet (20 mg total) by mouth daily with supper. 90 tablet 3   torsemide (DEMADEX) 20 MG tablet Take 1 tablet (20 mg total) by mouth daily. 30 tablet 1   No current facility-administered medications on file prior to visit.    BP 120/80   Pulse (!) 107   Temp (!) 97.5 F (36.4 C) (Oral)   Ht _0  (1.905 m)   Wt 273 lb (123.8 kg)   SpO2 97%   BMI 34.12 kg/m       Objective:   Physical Exam Vitals and nursing note reviewed.  Constitutional:      Appearance: Normal appearance.  Eyes:     General: Lids are normal. Lids are everted, no foreign bodies appreciated.     Conjunctiva/sclera:     Left eye: Left conjunctiva is injected. No exudate or hemorrhage.    Pupils: Pupils are equal, round, and reactive to light.     Left eye: Corneal abrasion and fluorescein uptake present.   Musculoskeletal:     Comments: Scant bloody drainage in tubing of wound vac. Sternal wound appears to be healing well    Skin:    General: Skin is warm and dry.     Capillary Refill: Capillary refill takes less than 2 seconds.  Neurological:     General: No focal deficit present.     Mental Status: He is alert and oriented to person, place, and time.  Psychiatric:        Mood and Affect: Mood normal.        Behavior: Behavior normal.        Thought Content: Thought content normal.        Judgment: Judgment normal.        Assessment & Plan:   1. Abrasion of left cornea, initial encounter - erythromycin ophthalmic ointment; Place 1 application into the left eye 3 (three) times daily for 7 days.  Dispense: 3.5 g; Refill: 1 - Follow up if no improvement in the next 2-3  days   Dorothyann Peng, NP

## 2020-11-18 ENCOUNTER — Other Ambulatory Visit: Payer: Self-pay

## 2020-11-18 ENCOUNTER — Ambulatory Visit (INDEPENDENT_AMBULATORY_CARE_PROVIDER_SITE_OTHER): Payer: Self-pay | Admitting: Physician Assistant

## 2020-11-18 VITALS — BP 116/66 | HR 77 | Resp 20 | Ht 72.0 in | Wt 266.2 lb

## 2020-11-18 DIAGNOSIS — Z4689 Encounter for fitting and adjustment of other specified devices: Secondary | ICD-10-CM

## 2020-11-18 NOTE — Progress Notes (Signed)
[address]       [place] [address]             [phone]        Matthew Savage is a 60 y.o. male patient s/p CABG  x 4 on 6/15 with Dr. Orvan Seen. A wound vac was placed on 7/1 for a sternal wound infection and IV antibiotics were initiated. He underwent a thoracentesis for a pleural effusion on 7/11. He had a complication hospital course which included post-op afib. He was discharged home with a wound vac and IV Ertapenem which is finished 8/16. He presents to the office today for a wound vac change and follow-up visit.    No diagnosis found. Past Medical History:  Diagnosis Date   Blood in urine    Class 2 obesity 09/30/2020   DDD (degenerative disc disease)    DVT (deep venous thrombosis) ([place]) 2017 & 2018   Gout    Heart murmur    Hyperlipidemia    Hypertension    Numbness and tingling    right arm and hand   SCC (squamous cell carcinoma)    skin, squamous cell, face   No past surgical history pertinent negatives on file. Scheduled Meds: Current Outpatient Medications on File Prior to Visit  Medication Sig Dispense Refill   acetaminophen (TYLENOL) 325 MG tablet Take 2 tablets (650 mg total) by mouth every 6 (six) hours as needed for fever.     allopurinol (ZYLOPRIM) 300 MG tablet TAKE 1 TABLET BY MOUTH EVERY DAY 90 tablet 0   amiodarone (PACERONE) 200 MG tablet Take 1 tablet (200 mg total) by mouth daily. 30 tablet 1   aspirin EC 81 MG EC tablet Take 1 tablet (81 mg total) by mouth daily. Swallow whole. 30 tablet 11   atorvastatin (LIPITOR) 80 MG tablet Take 1 tablet (80 mg total) by mouth daily. 30 tablet 1   colchicine 0.6 MG tablet Take 0.6 mg by mouth daily as needed (Flair up gout).      dapagliflozin propanediol (FARXIGA) 10 MG TABS tablet Take 1 tablet (10 mg total) by mouth daily before breakfast. 30 tablet 3   ertapenem 1,000 mg in sodium chloride 0.9 % 100 mL Inject 1,000 mg into the vein daily. 1 g 32   erythromycin ophthalmic ointment  Place 1 application into the left eye 3 (three) times daily for 7 days. 3.5 g 1   ferrous [id number] C-folic acid (TRINSICON / FOLTRIN) capsule Take 1 capsule by mouth daily.     metoprolol succinate (TOPROL-XL) 25 MG 24 hr tablet Take 1 tablet (25 mg total) by mouth daily. Take with or immediately following a meal. 30 tablet 1   oxyCODONE (OXY IR/ROXICODONE) 5 MG immediate release tablet Take 1 tablet (5 mg total) by mouth every 6 (six) hours as needed for severe pain. 24 tablet 0   rivaroxaban (XARELTO) 20 MG TABS tablet Take 1 tablet (20 mg total) by mouth daily with supper. 90 tablet 3   torsemide (DEMADEX) 20 MG tablet Take 1 tablet (20 mg total) by mouth daily. 30 tablet 1   No current facility-administered medications on file prior to visit.     Allergies  Allergen Reactions   Levaquin [Levofloxacin]     Aortic Aneurysm   Active Problems:   * No active hospital problems. *  There were no vitals taken for this visit.   Cor: RRR, no murmur Pulm: CTA bilaterally and in all  fields Wound: picture above, granulation tissue present, clean with some blood when irrigated Ext: venous stasis changes in bilateral lower ext. 1+ non pitting edema  Subjective  Objective: Vital signs (most recent): There were no vitals taken for this visit. 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 About 5.5cm long, 2cm wide, and 1.5cm deep.  Last day of Ertapenem is on 8/16 Atrial fibrillation-On Amio '200mg'$  daily and anticoagulation with Eldridge Abrahams and ASA'81mg'$  He has HF follow-up scheduled with Dr. Haroldine Laws in Oct  Plan: Return Thursday for wound vac change. Continue current medical therapy.   Elgie Collard 11/18/2020

## 2020-11-19 ENCOUNTER — Other Ambulatory Visit (HOSPITAL_COMMUNITY): Payer: BC Managed Care – PPO

## 2020-11-19 ENCOUNTER — Encounter: Payer: Self-pay | Admitting: Adult Health

## 2020-11-21 ENCOUNTER — Ambulatory Visit (HOSPITAL_COMMUNITY)
Admission: RE | Admit: 2020-11-21 | Discharge: 2020-11-21 | Disposition: A | Discharge status: Home / Self Care | Payer: BC Managed Care – PPO | Source: Ambulatory Visit | Attending: Cardiology | Admitting: Cardiology

## 2020-11-21 ENCOUNTER — Ambulatory Visit (INDEPENDENT_AMBULATORY_CARE_PROVIDER_SITE_OTHER): Payer: Self-pay

## 2020-11-21 ENCOUNTER — Other Ambulatory Visit: Payer: Self-pay

## 2020-11-21 DIAGNOSIS — I5042 Chronic combined systolic (congestive) and diastolic (congestive) heart failure: Secondary | ICD-10-CM | POA: Diagnosis not present

## 2020-11-21 DIAGNOSIS — Z4801 Encounter for change or removal of surgical wound dressing: Secondary | ICD-10-CM

## 2020-11-21 DIAGNOSIS — Z951 Presence of aortocoronary bypass graft: Secondary | ICD-10-CM

## 2020-11-21 LAB — BASIC METABOLIC PANEL
Anion gap: 9 (ref 5–15)
BUN: 27 mg/dL — ABNORMAL HIGH (ref 6–20)
CO2: 26 mmol/L (ref 22–32)
Calcium: 8.7 mg/dL — ABNORMAL LOW (ref 8.9–10.3)
Chloride: 104 mmol/L (ref 98–111)
Creatinine, Ser: 1.59 mg/dL — ABNORMAL HIGH (ref 0.61–1.24)
GFR, Estimated: 49 mL/min — ABNORMAL LOW (ref >60)
Glucose, Bld: 94 mg/dL (ref 70–99)
Potassium: 4 mmol/L (ref 3.5–5.1)
Sodium: 139 mmol/L (ref 135–145)

## 2020-11-21 LAB — FUNGUS CULTURE WITH STAIN

## 2020-11-21 LAB — FUNGAL ORGANISM REFLEX

## 2020-11-21 LAB — FUNGUS CULTURE RESULT

## 2020-11-21 NOTE — Progress Notes (Signed)
Patient presents for wound VAC change.  He is status post CABG with a small inferior defect.  Overall he is doing well  Physical exam:  BP 118/68 (BP Location: Left Arm, Patient Position: Sitting, Cuff Size: Large)   Pulse (!) 107   Resp 20   Ht '6\' 3"'$  (1.905 m)   Wt 122.5 kg   SpO2 95% Comment: RA  BMI 33.75 kg/m  The wound VAC is changed at the bedside.  There is good granulation tissue throughout.  Assessment/plan: Follow-up next week for VAC change and wound inspection  Brunetta Newingham Z. Orvan Seen, [place]

## 2020-11-21 NOTE — Progress Notes (Signed)
Patient arrived in clinic today for a wound vac change s/p CABG x4 with Dr. Orvan Seen 10/03/20 with sternal wound debridements and wound vac changes. Patient was discharged from the hospital 11/05/20 and is coming to the clinic Monday's and Thursday's for wound vac changes.   Canister changed out and old dressing removed. 2 pieces of foam removed from wound bed as wound is deep. Good granulation tissue in wound bed with some sloth present. Wound measures approximately 2 cm in length x 1.5 cm in width x 1.5 cm in depth with tracking at 12 o'clock. 2 pieces of black sponge were placed in the wound bed as thickness of sponge does not cover the entire wound. Wound vac dressing applied and wound vac turned on. Good suction noted immediately with no alarming although some gurgling present. Patient tolerated procedure well. Patient advised to keep dressing dry and intact until his follow-up with a PA Monday, 11/25/20. Patient acknowledged receipt.  Patient advised to contact the office tomorrow if leaking alarms began and he could come to the office for dressing change. Advised that if it is the weekend to contact his home health nurse for possible troubleshooting. He acknowledged receipt.

## 2020-11-22 LAB — FUNGUS CULTURE WITH STAIN

## 2020-11-22 LAB — FUNGUS CULTURE RESULT

## 2020-11-22 LAB — FUNGAL ORGANISM REFLEX

## 2020-11-25 ENCOUNTER — Ambulatory Visit (INDEPENDENT_AMBULATORY_CARE_PROVIDER_SITE_OTHER): Payer: Self-pay | Admitting: Physician Assistant

## 2020-11-25 ENCOUNTER — Other Ambulatory Visit: Payer: Self-pay

## 2020-11-25 ENCOUNTER — Encounter: Payer: Self-pay | Admitting: Physician Assistant

## 2020-11-25 VITALS — BP 116/76 | HR 69 | Resp 20

## 2020-11-25 DIAGNOSIS — Z4801 Encounter for change or removal of surgical wound dressing: Secondary | ICD-10-CM

## 2020-11-25 NOTE — Progress Notes (Signed)
HPI: Patient returns for sternal wound vac change. He underwent a CABG x 4,  coronary endarterectomies x 2, LA clip, repair of ASD/PFO with patch  on 10/03/2020. Lower sternal wound had eschar that "fell off" and had  some erythema on both sides but no drainage. As discussed with  surgeon, will get CT of chest (without contrast) to look for infection.  Also, as discussed with the surgeon, will not start prophylactic antibiotic yet. CT of the chest showed with approximately 37 mL of retrosternal  fluid in the superior mediastinum, abscess is not excluded, no pericardial effusion, and small bilateral pleural effusions. As discussed with Dr.  Kipp Brood, Vancomycin and Cefepime started empirically. Blood culture showed Serratia marcescens. Per pharmacy, antibiotic changed to Rocephin.  Infectious disease was consulted to assist with antibiotic regimen.  Per recommendation, Cefepime was restarted. Patient was taken  to OR for I and D of his sternal wound on 07/01.  More recently, Wound VAC change and placement of Myriad Morcells by Dr. Orvan Seen on 10/23/2020. Infectious disease changed IV Cefepime to IV Ertapenem  (1 gram IV daily) for easier dosing. Per infectious disease, last day of IV  Ertapenem to be 12/03/2020.  Current Outpatient Medications  Medication Sig Dispense Refill   acetaminophen (TYLENOL) 325 MG tablet Take 2 tablets (650 mg total) by mouth every 6 (six) hours as needed for fever.     allopurinol (ZYLOPRIM) 300 MG tablet TAKE 1 TABLET BY MOUTH EVERY DAY 90 tablet 0   amiodarone (PACERONE) 200 MG tablet Take 1 tablet (200 mg total) by mouth daily. 30 tablet 1   aspirin EC 81 MG EC tablet Take 1 tablet (81 mg total) by mouth daily. Swallow whole. 30 tablet 11   atorvastatin (LIPITOR) 80 MG tablet Take 1 tablet (80 mg total) by mouth daily. 30 tablet 1   colchicine 0.6 MG tablet Take 0.6 mg by mouth daily as needed (Flair up gout).      dapagliflozin propanediol (FARXIGA) 10 MG  TABS tablet Take 1 tablet (10 mg total) by mouth daily before breakfast. 30 tablet 3   ertapenem 1,000 mg in sodium chloride 0.9 % 100 mL Inject 1,000 mg into the vein daily. 1 g 32   ferrous [id number] C-folic acid (TRINSICON / FOLTRIN) capsule Take 1 capsule by mouth daily.     metoprolol succinate (TOPROL-XL) 25 MG 24 hr tablet Take 1 tablet (25 mg total) by mouth daily. Take with or immediately following a meal. 30 tablet 1   oxyCODONE (OXY IR/ROXICODONE) 5 MG immediate release tablet Take 1 tablet (5 mg total) by mouth every 6 (six) hours as needed for severe pain. 24 tablet 0   rivaroxaban (XARELTO) 20 MG TABS tablet Take 1 tablet (20 mg total) by mouth daily with supper. 90 tablet 3   torsemide (DEMADEX) 20 MG tablet Take 1 tablet (20 mg total) by mouth daily. 30 tablet 1   Vitals:   11/25/20 1445  BP: 116/76  Pulse: 69  Resp: 20  SpO2: 99%      Physical Exam: Wounds-RLE and sternal wound are clean and dry, no sign of infection. Lower portion of sternal wound has wound VAC. This was removed and wound is clean and there is no purulence noted. There is granulation along the sides and at most inferior of wound. Wound measurements are 4/12 cm long by 1 1/2 cm deep.   Impression and Plan: Wound VAC is functioning properly after change. Patient will return to office on Thursday  for sternal wound VAC change. Patient has a follow up appointment with Dr. West Bali (infectious disease)  and Ertapenem IV to be continued through 12/03/2020     Nani Skillern, PA-C [facility] [phone]

## 2020-11-27 ENCOUNTER — Other Ambulatory Visit: Payer: Self-pay | Admitting: Physician Assistant

## 2020-11-27 DIAGNOSIS — I10 Essential (primary) hypertension: Secondary | ICD-10-CM

## 2020-11-27 DIAGNOSIS — I4891 Unspecified atrial fibrillation: Secondary | ICD-10-CM

## 2020-11-28 ENCOUNTER — Encounter: Payer: Self-pay | Admitting: Adult Health

## 2020-11-28 ENCOUNTER — Ambulatory Visit (INDEPENDENT_AMBULATORY_CARE_PROVIDER_SITE_OTHER): Payer: Self-pay

## 2020-11-28 ENCOUNTER — Other Ambulatory Visit: Payer: Self-pay

## 2020-11-28 DIAGNOSIS — Z4689 Encounter for fitting and adjustment of other specified devices: Secondary | ICD-10-CM

## 2020-11-28 DIAGNOSIS — Z4889 Encounter for other specified surgical aftercare: Secondary | ICD-10-CM

## 2020-11-28 NOTE — Telephone Encounter (Signed)
Please advise 

## 2020-11-28 NOTE — Progress Notes (Signed)
Patient arrived in clinic today for a wound vac change s/p CABG x4 with Dr. Orvan Seen 10/03/20 with sternal wound debridements and wound vac changes. Patient was discharged from the hospital 11/05/20 and is coming to the clinic Monday's and Thursday's for wound vac changes.   Canister changed out and old dressing removed. 1 piece of foam removed from wound bed. Good granulation tissue in wound bed. Wound measures approximately 3 cm in length x 1 cm in width x 1 cm in depth. 1 pieces of black sponge were placed in the wound bed. Wound vac dressing applied. Mushroomed with sponge and applied another dressing and wound vac turned on. Good suction noted immediately with no alarming. Patient tolerated procedure well. Patient advised to keep dressing dry and intact until his follow-up with nurse on Monday. Patient acknowledged receipt.   Patient scheduled appointment for Friday this week instead of Thursday due to family requesting he be seen by a physician instead of PA.

## 2020-11-29 ENCOUNTER — Encounter (HOSPITAL_COMMUNITY): Payer: Self-pay

## 2020-11-29 ENCOUNTER — Other Ambulatory Visit: Payer: Self-pay | Admitting: Adult Health

## 2020-11-29 DIAGNOSIS — S0502XA Injury of conjunctiva and corneal abrasion without foreign body, left eye, initial encounter: Secondary | ICD-10-CM

## 2020-11-29 NOTE — Telephone Encounter (Signed)
Where would you like for me to send the referral to? Please advise

## 2020-12-02 ENCOUNTER — Ambulatory Visit: Payer: BC Managed Care – PPO

## 2020-12-02 ENCOUNTER — Ambulatory Visit (INDEPENDENT_AMBULATORY_CARE_PROVIDER_SITE_OTHER): Payer: Self-pay | Admitting: Thoracic Surgery (Cardiothoracic Vascular Surgery)

## 2020-12-02 ENCOUNTER — Ambulatory Visit (INDEPENDENT_AMBULATORY_CARE_PROVIDER_SITE_OTHER): Payer: BC Managed Care – PPO | Admitting: Infectious Diseases

## 2020-12-02 ENCOUNTER — Other Ambulatory Visit: Payer: Self-pay

## 2020-12-02 ENCOUNTER — Telehealth: Payer: Self-pay

## 2020-12-02 VITALS — BP 122/82 | HR 81 | Resp 20

## 2020-12-02 VITALS — BP 124/85 | HR 86 | Temp 97.3°F | Wt 276.0 lb

## 2020-12-02 DIAGNOSIS — Z4689 Encounter for fitting and adjustment of other specified devices: Secondary | ICD-10-CM

## 2020-12-02 DIAGNOSIS — M869 Osteomyelitis, unspecified: Secondary | ICD-10-CM | POA: Diagnosis not present

## 2020-12-02 NOTE — Progress Notes (Addendum)
[facility]                                                             [place], [place], [place], [address]                                                                  Phn. 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 End date for 6 weeks 12/03/20 [facility] is following Sternal wound is healing well given reduction in size overall  Medication Monitoring  Labs 11/12/20 Cr 1.45, WBC 7.3, hb 9.3, plts 380, ESR 33 and CRP 3  Plan -Continue 2 more weeks of ertapenem ( finish off tx) to complete 8 weeks of tx. Avoiding quinolones in the setting of DDIs and allergy listed with levaquin ( aortic aneurysm) and bactrim in the setting of renal insufficiency. New end date is 12/17/20  -RN to coordinate with [facility] for removal of PICC line after completion if IV abtx on 8/30  - Fu with [facility] for management of wound vac - Fu with [place] as needed   All questions and concerns were discussed and addressed. Patient verbalized understanding of the plan. ____________________________________________________________________________________________________________________ Subjective/Interval events Here for hospital follow up for Sternal Osteomyelitis.  He is accompanied by his cousin who he is helping with the IV antibiotics. Cousin tells me his family members are coming from out of state/taking turns to help him with the IV abtx at home. Patient has been receiving IV ertapenem through his right PICC line without any issues.  Denies any pain, tenderness or  swelling at the PICC line site.  Home health nurses coming once a week for dressing changes.  Denies any fever, chills and sweats.  Denies any nausea, vomiting abdominal pain or diarrhea rashes.  Sternal wound is healing well without any pain, tenderness swelling or discharge.  He is following up with CT [facility]. On 7/21, per Dr Orvan Seen, small inferiorly oriented defect of the sternum. The defect continues to measure approximately 5 cm in length and 2 cm in width and 3 cm in depth. Patient also is having wound vac changes twice a week as recommended by [facility]. Last seen on  8/11 had a wound vac change, Wound measured approximately 3 cm in length x 1 cm in width x 1 cm in depth ( overall reducing in size).  He says there is a a plan for wound vac soon.   ROS: Constitutional: Negative for fever, chills, activity change, appetite change, fatigue and unexpected weight change.  Respiratory: Negative for cough, shortness of breath Cardiovascular: Negative for chest pain, palpitations and leg swelling.  Gastrointestinal: Negative for nausea, vomiting, abdominal pain, diarrhea/constipation, .  Genitourinary: Negative for dysuria, hematuria, flank pain Musculoskeletal: Negative for myalgias, arthralgia, back pain, joint swelling, arthralgias Skin: Negative for rashes, lesions  Neurological: Negative for weakness, dizziness or headache  Past Medical History:  Diagnosis Date   Blood in urine    Class 2 obesity 09/30/2020   DDD (degenerative disc disease)    DVT (deep venous thrombosis) ([place]) 2017 & 2018   Gout    Heart murmur    Hyperlipidemia    Hypertension    Numbness and tingling    right arm and hand   SCC (squamous cell carcinoma)    skin, squamous cell, face   Past Surgical History:  Procedure Laterality Date   A-FLUTTER ABLATION N/A 03/11/2020   Procedure: A-FLUTTER ABLATION;  Surgeon: Deboraha Sprang, MD;  Location: [place] [facility] [facility];  Service: Cardiovascular;  Laterality: N/A;   APPLICATION OF  WOUND VAC  10/18/2020   Procedure: APPLICATION OF WOUND VAC;  Surgeon: Lajuana Matte, MD;  Location: [place];  Service: Thoracic;;   ASD REPAIR N/A 10/03/2020   Procedure: ATRIAL SEPTAL DEFECT (ASD) REPAIR WITH PERI GUARD PERICARDIUM;  Surgeon: Wonda Olds, MD;  Location: [facility];  Service: Open Heart Surgery;  Laterality: N/A;   CLIPPING OF ATRIAL APPENDAGE Left 10/03/2020   Procedure: CLIPPING OF ATRIAL APPENDAGE WITH ATRICURE 52 CLIP;  Surgeon: Wonda Olds, MD;  Location: [place];  Service: Open Heart Surgery;  Laterality: Left;   CORONARY ARTERY BYPASS GRAFT N/A 10/03/2020   Procedure: CORONARY ARTERY BYPASS GRAFTING (CABG) X FOUR, USING BILATERAL MAMMARY ARTERIES AND RIGHT ENDOSCOPIC GREATER SAPHENOUS VEIN CONDUITS;  Surgeon: Wonda Olds, MD;  Location: [place] [facility];  Service: Open Heart Surgery;  Laterality: N/A;   HERNIA REPAIR  1967   X 2   IR THORACENTESIS ASP PLEURAL SPACE W/IMG GUIDE  10/28/2020   LAPAROSCOPIC GASTRIC BANDING  03/2009   in [place], [place] CATH AND CORONARY ANGIOGRAPHY N/A 10/01/2020   Procedure: LEFT HEART CATH AND CORONARY ANGIOGRAPHY;  Surgeon: Troy Sine, MD;  Location: [facility];  Service: Cardiovascular;  Laterality: N/A;   MASS EXCISION  12/03/2011   X 2 (gluteal) X 1 (lower back)   MOHS SURGERY     x 2   STERNAL WOUND DEBRIDEMENT N/A 10/18/2020   Procedure: STERNAL WOUND DEBRIDEMENT;  Surgeon: Lajuana Matte, MD;  Location: [facility] [place];  Service: Thoracic;  Laterality: N/A;   STERNAL WOUND DEBRIDEMENT N/A 10/23/2020   Procedure: WOUND VAC CHANGE with APPLICATION OF MYRIAD MORCELLS;  Surgeon: Wonda Olds, MD;  Location: [place];  Service: Thoracic;  Laterality: N/A;   TEE WITHOUT CARDIOVERSION N/A 10/03/2020   Procedure: TRANSESOPHAGEAL ECHOCARDIOGRAM (TEE);  Surgeon: Wonda Olds, MD;  Location: [facility] [facility];  Service: Open Heart Surgery;  Laterality: N/A;   Current Outpatient Medications on File Prior to Visit  Medication Sig  Dispense Refill   acetaminophen (TYLENOL) 325 MG tablet Take 2 tablets (650 mg total) by mouth every 6 (six) hours as needed for fever.     allopurinol (ZYLOPRIM) 300 MG tablet TAKE 1 TABLET BY MOUTH EVERY DAY 90 tablet 0  amiodarone (PACERONE) 200 MG tablet Take 1 tablet (200 mg total) by mouth daily. 30 tablet 1   aspirin EC 81 MG EC tablet Take 1 tablet (81 mg total) by mouth daily. Swallow whole. 30 tablet 11   atorvastatin (LIPITOR) 80 MG tablet Take 1 tablet (80 mg total) by mouth daily. 30 tablet 1   colchicine 0.6 MG tablet Take 0.6 mg by mouth daily as needed (Flair up gout).      dapagliflozin propanediol (FARXIGA) 10 MG TABS tablet Take 1 tablet (10 mg total) by mouth daily before breakfast. 30 tablet 3   ertapenem 1,000 mg in sodium chloride 0.9 % 100 mL Inject 1,000 mg into the vein daily. 1 g 32   ferrous [id number] C-folic acid (TRINSICON / FOLTRIN) capsule Take 1 capsule by mouth daily.     metoprolol succinate (TOPROL-XL) 25 MG 24 hr tablet Take 1 tablet (25 mg total) by mouth daily. Take with or immediately following a meal. 30 tablet 1   oxyCODONE (OXY IR/ROXICODONE) 5 MG immediate release tablet Take 1 tablet (5 mg total) by mouth every 6 (six) hours as needed for severe pain. 24 tablet 0   rivaroxaban (XARELTO) 20 MG TABS tablet Take 1 tablet (20 mg total) by mouth daily with supper. 90 tablet 3   torsemide (DEMADEX) 20 MG tablet Take 1 tablet (20 mg total) by mouth daily. 30 tablet 1   No current facility-administered medications on file prior to visit.   Allergies  Allergen Reactions   Levaquin [Levofloxacin]     Aortic Aneurysm   Social History   Socioeconomic History   Marital status: Single    Spouse name: Not on file   Number of children: Not on file   Years of education: Not on file   Highest education level: Not on file  Occupational History   Not on file  Tobacco Use   Smoking status: Former    Packs/day: 2.00    Years: 36.00    Pack years:  72.00    Types: Cigarettes    Quit date: 01/01/2020    Years since quitting: 0.9   Smokeless tobacco: Never  Substance and Sexual Activity   Alcohol use: Yes    Alcohol/week: 12.0 standard drinks    Types: 12 Standard drinks or equivalent per week    Comment: 2 per day   Drug use: No   Sexual activity: Not on file  Other Topics Concern   Not on file  Social History Narrative   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    Married    No kids   Social Determinants of Radio broadcast assistant Strain: Not on file  Food Insecurity: Not on file  Transportation Needs: Not on file  Physical Activity: Not on file  Stress: Not on file  Social Connections: Not on file  Intimate Partner Violence: Not on file   Family History  Problem Relation Age of Onset   Breast cancer Mother    Other Mother        small cell carcinoma   Hypertension Father    Colon polyps Father    Colon polyps Brother     Vitals BP 124/85   Pulse 86   Temp (!) 97.3 F (36.3 C) (Oral)   Wt 276 lb (125.2 kg)   SpO2 100%   BMI 37.43 kg/m '  Examination  General - not in acute distress, comfortably sitting in chair HEENT - [place]/AT, no pallor and  no icterus Chest - b/l clear air entry, no additional sounds CVS- Normal P3X9, systolic murmur+    Abdomen - Soft, Non tender , non distended Ext- no pedal edema Neuro: grossly normal Psych : calm and cooperative   Recent labs CBC Latest Ref Rng & Units 10/24/2020 10/19/2020 10/18/2020  WBC 4.0 - 10.5 K/uL 8.0 11.5(H) 13.7(H)  Hemoglobin 13.0 - 17.0 g/dL 7.6(L) 8.1(L) 7.8(L)  Hematocrit 39.0 - 52.0 % 24.3(L) 25.4(L) 24.2(L)  Platelets 150 - 400 K/uL 257 314 349   CMP Latest Ref Rng & Units 11/21/2020 11/08/2020 11/02/2020  Glucose 70 - 99 mg/dL 94 109(H) 103(H)  BUN 6 - 20 mg/dL 27(H) 42(H) 44(H)  Creatinine 0.61 - 1.24 mg/dL 1.59(H) 1.63(H) 1.49(H)  Sodium 135 - 145 mmol/L 139 134(L) 133(L)  Potassium 3.5 - 5.1 mmol/L 4.0 4.2 4.6  Chloride 98 - 111 mmol/L  104 99 95(L)  CO2 22 - 32 mmol/L '26 26 29  ' Calcium 8.9 - 10.3 mg/dL 8.7(L) 9.0 9.1  Total Protein 6.5 - 8.1 g/dL - - -  Total Bilirubin 0.3 - 1.2 mg/dL - - -  Alkaline Phos 38 - 126 U/L - - -  AST 15 - 41 U/L - - -  ALT 0 - 44 U/L - - -     Pertinent Microbiology Results for orders placed or performed during the hospital encounter of 09/29/20  Resp Panel by RT-PCR (Flu A&B, Covid) Nasopharyngeal Swab     Status: None   Collection Time: 09/30/20  2:27 AM   Specimen: Nasopharyngeal Swab; Nasopharyngeal(NP) swabs in vial transport medium  Result Value Ref Range Status   SARS Coronavirus 2 by RT PCR NEGATIVE NEGATIVE Final    Comment: (NOTE) SARS-CoV-2 target nucleic acids are NOT DETECTED.  The SARS-CoV-2 RNA is generally detectable in upper respiratory specimens during the acute phase of infection. The lowest concentration of SARS-CoV-2 viral copies this assay can detect is 138 copies/mL. A negative result does not preclude SARS-Cov-2 infection and should not be used as the sole basis for treatment or other patient management decisions. A negative result may occur with  improper specimen collection/handling, submission of specimen other than nasopharyngeal swab, presence of viral mutation(s) within the areas targeted by this assay, and inadequate number of viral copies(<138 copies/mL). A negative result must be combined with clinical observations, patient history, and epidemiological information. The expected result is Negative.  Fact Sheet for Patients:  EntrepreneurPulse.com.au  Fact Sheet for Healthcare Providers:  IncredibleEmployment.be  This test is no t yet approved or cleared by the [place] FDA and  has been authorized for detection and/or diagnosis of SARS-CoV-2 by FDA under an Emergency Use Authorization (EUA). This EUA will remain  in effect (meaning this test can be used) for the duration of the COVID-19 declaration under  Section 564(b)(1) of the Act, 21 U.S.C.section 360bbb-3(b)(1), unless the authorization is terminated  or revoked sooner.       Influenza A by PCR NEGATIVE NEGATIVE Final   Influenza B by PCR NEGATIVE NEGATIVE Final    Comment: (NOTE) The Xpert Xpress SARS-CoV-2/FLU/RSV plus assay is intended as an aid in the diagnosis of influenza from Nasopharyngeal swab specimens and should not be used as a sole basis for treatment. Nasal washings and aspirates are unacceptable for Xpert Xpress SARS-CoV-2/FLU/RSV testing.  Fact Sheet for Patients: EntrepreneurPulse.com.au  Fact Sheet for Healthcare Providers: IncredibleEmployment.be  This test is not yet approved or cleared by the [place] and has been authorized for  detection and/or diagnosis of SARS-CoV-2 by FDA under an Emergency Use Authorization (EUA). This EUA will remain in effect (meaning this test can be used) for the duration of the COVID-19 declaration under Section 564(b)(1) of the Act, 21 U.S.C. section 360bbb-3(b)(1), unless the authorization is terminated or revoked.  Performed at [facility], [address], [place], [place] [address]   Culture, blood (routine x 2)     Status: Abnormal   Collection Time: 10/17/20  2:30 AM   Specimen: BLOOD LEFT HAND  Result Value Ref Range Status   Specimen Description BLOOD LEFT HAND  Final   Special Requests   Final    BOTTLES DRAWN AEROBIC AND ANAEROBIC Blood Culture adequate volume   Culture  Setup Time   Final    IN BOTH AEROBIC AND ANAEROBIC BOTTLES GRAM NEGATIVE RODS CRITICAL VALUE NOTED.  VALUE IS CONSISTENT WITH PREVIOUSLY REPORTED AND CALLED VALUE.    Culture (A)  Final    SERRATIA MARCESCENS SUSCEPTIBILITIES PERFORMED ON PREVIOUS CULTURE WITHIN THE LAST 5 DAYS. Performed at [facility], [place] [address] [place], [place] [address]    Report Status 10/19/2020 FINAL  Final  Culture, blood (routine x 2)     Status: Abnormal    Collection Time: 10/17/20  2:30 AM   Specimen: BLOOD  Result Value Ref Range Status   Specimen Description BLOOD RIGHT ANTECUBITAL  Final   Special Requests   Final    BOTTLES DRAWN AEROBIC AND ANAEROBIC Blood Culture adequate volume   Culture  Setup Time   Final    AEROBIC BOTTLE ONLY GRAM NEGATIVE RODS CRITICAL RESULT CALLED TO, READ BACK BY AND VERIFIED WITH: D[place] Brick [facility] 10/18/20 0208 JDW Performed at [facility], [place] [address], [place], [place] [address]    Culture SERRATIA MARCESCENS (A)  Final   Report Status 10/19/2020 FINAL  Final   Organism ID, Bacteria SERRATIA MARCESCENS  Final      Susceptibility   Serratia marcescens - MIC*    CEFAZOLIN >=64 RESISTANT Resistant     CEFEPIME <=0.12 SENSITIVE Sensitive     CEFTAZIDIME <=1 SENSITIVE Sensitive     CEFTRIAXONE <=0.25 SENSITIVE Sensitive     CIPROFLOXACIN <=0.25 SENSITIVE Sensitive     GENTAMICIN <=1 SENSITIVE Sensitive     TRIMETH/SULFA <=20 SENSITIVE Sensitive     * SERRATIA MARCESCENS  Blood Culture ID Panel (Reflexed)     Status: Abnormal   Collection Time: 10/17/20  2:30 AM  Result Value Ref Range Status   Enterococcus faecalis NOT DETECTED NOT DETECTED Final   Enterococcus Faecium NOT DETECTED NOT DETECTED Final   Listeria monocytogenes NOT DETECTED NOT DETECTED Final   Staphylococcus species NOT DETECTED NOT DETECTED Final   Staphylococcus aureus (BCID) NOT DETECTED NOT DETECTED Final   Staphylococcus epidermidis NOT DETECTED NOT DETECTED Final   Staphylococcus lugdunensis NOT DETECTED NOT DETECTED Final   Streptococcus species NOT DETECTED NOT DETECTED Final   Streptococcus agalactiae NOT DETECTED NOT DETECTED Final   Streptococcus pneumoniae NOT DETECTED NOT DETECTED Final   Streptococcus pyogenes NOT DETECTED NOT DETECTED Final   A.calcoaceticus-baumannii NOT DETECTED NOT DETECTED Final   Bacteroides fragilis NOT DETECTED NOT DETECTED Final   Enterobacterales DETECTED (A) NOT DETECTED Final     Comment: Enterobacterales represent a large order of gram negative bacteria, not a single organism. CRITICAL RESULT CALLED TO, READ BACK BY AND VERIFIED WITH: G ABBOTT PHARMD 10/18/20 0208 JDW    Enterobacter cloacae complex NOT DETECTED NOT  DETECTED Final   Escherichia coli NOT DETECTED NOT DETECTED Final   Klebsiella aerogenes NOT DETECTED NOT DETECTED Final   Klebsiella oxytoca NOT DETECTED NOT DETECTED Final   Klebsiella pneumoniae NOT DETECTED NOT DETECTED Final   Proteus species NOT DETECTED NOT DETECTED Final   Salmonella species NOT DETECTED NOT DETECTED Final   Serratia marcescens DETECTED (A) NOT DETECTED Final    Comment: CRITICAL RESULT CALLED TO, READ BACK BY AND VERIFIED WITH: G ABBOTT PHARMD 10/18/20 0208 JDW    Haemophilus influenzae NOT DETECTED NOT DETECTED Final   Neisseria meningitidis NOT DETECTED NOT DETECTED Final   Pseudomonas aeruginosa NOT DETECTED NOT DETECTED Final   Stenotrophomonas maltophilia NOT DETECTED NOT DETECTED Final   Candida albicans NOT DETECTED NOT DETECTED Final   Candida auris NOT DETECTED NOT DETECTED Final   Candida glabrata NOT DETECTED NOT DETECTED Final   Candida krusei NOT DETECTED NOT DETECTED Final   Candida parapsilosis NOT DETECTED NOT DETECTED Final   Candida tropicalis NOT DETECTED NOT DETECTED Final   Cryptococcus neoformans/gattii NOT DETECTED NOT DETECTED Final   CTX-M ESBL NOT DETECTED NOT DETECTED Final   Carbapenem resistance IMP NOT DETECTED NOT DETECTED Final   Carbapenem resistance KPC NOT DETECTED NOT DETECTED Final   Carbapenem resistance NDM NOT DETECTED NOT DETECTED Final   Carbapenem resist OXA 48 LIKE NOT DETECTED NOT DETECTED Final   Carbapenem resistance VIM NOT DETECTED NOT DETECTED Final    Comment: Performed at [facility], [address], [place], [place] [address]  Surgical pcr screen     Status: None   Collection Time: 10/18/20  2:44 PM   Specimen: Nasal Mucosa; Nasal Swab  Result Value Ref Range  Status   MRSA, PCR NEGATIVE NEGATIVE Final   Staphylococcus aureus NEGATIVE NEGATIVE Final    Comment: (NOTE) The Xpert SA Assay (FDA approved for NASAL specimens in patients 52 years of age and older), is one component of a comprehensive surveillance program. It is not intended to diagnose infection nor to guide or monitor treatment. Performed at [facility], [place] [address] [place], [place] [address]   Virus culture     Status: None   Collection Time: 10/18/20  4:49 PM   Specimen: PATH Cytology Misc. fluid; Body Fluid  Result Value Ref Range Status   Viral Culture No virus isolated.  Corrected    Comment: (NOTE) Performed At: [facility] [address] [place], [place] [id number] Rush Farmer MD VX:[id number] CORRECTED ON 07/13 AT 0936: PREVIOUSLY REPORTED AS Comment    Source of Sample FLUID  Final    Comment: SAMPLE 3 Performed at [facility], [place] [address] [place], [place] [address]   Aerobic/Anaerobic Culture w Gram Stain (surgical/deep wound)     Status: None   Collection Time: 10/18/20  4:49 PM   Specimen: PATH Cytology Misc. fluid; Body Fluid  Result Value Ref Range Status   Specimen Description FLUID SAMPLE 3  Final   Special Requests MEDIASTINAL  Final   Gram Stain   Final    MODERATE WBC PRESENT, PREDOMINANTLY MONONUCLEAR NO ORGANISMS SEEN    Culture   Final    No growth aerobically or anaerobically. Performed at [facility], [place] [address] [place], [place] [address]    Report Status 10/24/2020 FINAL  Final  Fungus Culture With Stain     Status: None   Collection Time: 10/18/20  4:51 PM   Specimen: PATH Cytology Misc. fluid; Body  Fluid  Result Value Ref Range Status   Fungus Stain Final report  Final   Fungus (Mycology) Culture Final report  Final    Comment: (NOTE) Performed At: [facility] [address] [place], [place] [id number] Rush Farmer MD [id number]    Fungal Source FLUID  Final    Comment:   SAMPLE2 Performed at [facility], [place] [address], [place], [place] [address]   Fungus Culture With Stain     Status: None   Collection Time: 10/18/20  4:51 PM   Specimen: PATH Cytology Misc. fluid; Body Fluid  Result Value Ref Range Status   Fungus Stain Final report  Final   Fungus (Mycology) Culture Final report  Final    Comment: (NOTE) Performed At: [facility] [address] [place], [place] [id number] Rush Farmer MD [id number]    Fungal Source FLUID  Final    Comment:  SAMPLE 1 Performed at [facility], [place] [address] [place], [place] [address]   Aerobic/Anaerobic Culture w Gram Stain (surgical/deep wound)     Status: None   Collection Time: 10/18/20  4:51 PM   Specimen: PATH Cytology Misc. fluid; Body Fluid  Result Value Ref Range Status   Specimen Description FLUID  SAMPLE 2  Final   Special Requests MEDIASTINAL  Final   Gram Stain   Final    FEW WBC PRESENT, PREDOMINANTLY PMN NO ORGANISMS SEEN    Culture   Final    RARE SERRATIA MARCESCENS NO ANAEROBES ISOLATED Performed at [facility], [address], [place], [place] [address]    Report Status 10/24/2020 FINAL  Final   Organism ID, Bacteria SERRATIA MARCESCENS  Final      Susceptibility   Serratia marcescens - MIC*    CEFAZOLIN >=64 RESISTANT Resistant     CEFEPIME <=0.12 SENSITIVE Sensitive     CEFTAZIDIME <=1 SENSITIVE Sensitive     CEFTRIAXONE <=0.25 SENSITIVE Sensitive     CIPROFLOXACIN <=0.25 SENSITIVE Sensitive     GENTAMICIN <=1 SENSITIVE Sensitive     TRIMETH/SULFA <=20 SENSITIVE Sensitive     * RARE SERRATIA MARCESCENS  Acid Fast Smear (AFB)     Status: None   Collection Time: 10/18/20  4:51 PM   Specimen: PATH Cytology Misc. fluid; Body Fluid  Result Value Ref Range Status   AFB Specimen Processing Concentration  Final   Acid Fast Smear Negative  Final    Comment: (NOTE) Performed At: [facility] [place], [place] [id number] Rush Farmer MD [id number]    Source (AFB) FLUID  Final    Comment:  SAMPLE 2 Performed at [facility], [place] [address], [place], [place] [address]   Acid Fast Smear (AFB)     Status: None   Collection Time: 10/18/20  4:51 PM   Specimen: PATH Cytology Misc. fluid; Body Fluid  Result Value Ref Range Status   AFB Specimen Processing Concentration  Final   Acid Fast Smear Negative  Final    Comment: (NOTE) Performed At: [facility] [place], [place] [id number] Rush Farmer MD [id number]    Source (AFB) FLUID  Final    Comment:  SAMPLE 1 Performed at [facility], [place] [address] [place], [place] [address]   Aerobic Culture w Gram Stain (superficial specimen)     Status: None   Collection Time: 10/18/20  4:51 PM   Specimen: Fluid  Result Value Ref Range Status  Specimen Description FLUID  SAMPLE 1  Final   Special Requests MEDIASTINAL  Final   Gram Stain   Final    RARE WBC PRESENT, PREDOMINANTLY PMN NO ORGANISMS SEEN Performed at [facility], [place] [address] [place], [place] [address]    Culture RARE SERRATIA MARCESCENS  Final   Report Status 10/21/2020 FINAL  Final   Organism ID, Bacteria SERRATIA MARCESCENS  Final      Susceptibility   Serratia marcescens - MIC*    CEFAZOLIN >=64 RESISTANT Resistant     CEFEPIME <=0.12 SENSITIVE Sensitive     CEFTAZIDIME <=1 SENSITIVE Sensitive     CEFTRIAXONE <=0.25 SENSITIVE Sensitive     CIPROFLOXACIN <=0.25 SENSITIVE Sensitive     GENTAMICIN <=1 SENSITIVE Sensitive     TRIMETH/SULFA <=20 SENSITIVE Sensitive     * RARE SERRATIA MARCESCENS  Fungus Culture Result     Status: None   Collection Time: 10/18/20  4:51 PM  Result Value Ref Range Status   Result 1 Comment  Final    Comment: (NOTE) KOH/Calcofluor preparation:  no fungus observed. Performed At: [facility] [place], [place] [id number] Rush Farmer MD [id number]   Fungus Culture Result     Status:  None   Collection Time: 10/18/20  4:51 PM  Result Value Ref Range Status   Result 1 Comment  Final    Comment: (NOTE) KOH/Calcofluor preparation:  no fungus observed. Performed At: [facility] [place], [place] [id number] Rush Farmer MD PZ:[id number]   Fungal organism reflex     Status: None   Collection Time: 10/18/20  4:51 PM  Result Value Ref Range Status   Fungal result 1 Comment  Final    Comment: (NOTE) No yeast or mold isolated after 4 weeks. Performed At: [facility] [place], [place] [id number] Rush Farmer MD [id number]   Fungal organism reflex     Status: None   Collection Time: 10/18/20  4:51 PM  Result Value Ref Range Status   Fungal result 1 Comment  Final    Comment: (NOTE) No yeast or mold isolated after 4 weeks. Performed At: [facility] [place], [place] [id number] Rush Farmer MD [id number]   Culture, blood (routine x 2)     Status: None   Collection Time: 10/22/20 12:51 PM   Specimen: BLOOD  Result Value Ref Range Status   Specimen Description BLOOD RIGHT ANTECUBITAL  Final   Special Requests   Final    BOTTLES DRAWN AEROBIC AND ANAEROBIC Blood Culture adequate volume   Culture   Final    NO GROWTH 5 DAYS Performed at [facility], [address] [place], [place] [address]    Report Status 10/27/2020 FINAL  Final  Culture, blood (routine x 2)     Status: None   Collection Time: 10/22/20 12:54 PM   Specimen: BLOOD RIGHT ARM  Result Value Ref Range Status   Specimen Description BLOOD RIGHT ARM  Final   Special Requests   Final    BOTTLES DRAWN AEROBIC AND ANAEROBIC Blood Culture adequate volume   Culture   Final    NO GROWTH 5 DAYS Performed at [facility], [place] [address] [place], [place] [address]    Report Status 10/27/2020 FINAL  Final  SARS CORONAVIRUS 2 (TAT 6-24 HRS) Nasopharyngeal Nasopharyngeal Swab     Status: None   Collection Time:  10/25/20 11:57 AM   Specimen: Nasopharyngeal  Swab  Result Value Ref Range Status   SARS Coronavirus 2 NEGATIVE NEGATIVE Final    Comment: (NOTE) SARS-CoV-2 target nucleic acids are NOT DETECTED.  The SARS-CoV-2 RNA is generally detectable in upper and lower respiratory specimens during the acute phase of infection. Negative results do not preclude SARS-CoV-2 infection, do not rule out co-infections with other pathogens, and should not be used as the sole basis for treatment or other patient management decisions. Negative results must be combined with clinical observations, patient history, and epidemiological information. The expected result is Negative.  Fact Sheet for Patients: SugarRoll.be  Fact Sheet for Healthcare Providers: https://www.woods-mathews.com/  This test is not yet approved or cleared by the [place] FDA and  has been authorized for detection and/or diagnosis of SARS-CoV-2 by FDA under an Emergency Use Authorization (EUA). This EUA will remain  in effect (meaning this test can be used) for the duration of the COVID-19 declaration under Se ction 564(b)(1) of the Act, 21 U.S.C. section 360bbb-3(b)(1), unless the authorization is terminated or revoked sooner.  Performed at [facility], [place] [address], [place], [place] [address]     Pertinent Imaging All pertinent labs/Imagings/notes reviewed. All pertinent plain films and CT images have been personally visualized and interpreted; radiology reports have been reviewed. Decision making incorporated into the Impression / Recommendations.  I have spent a total of 60 minutes of face-to-face and non-face-to-face time, excluding clinical staff time, preparing to see patient, ordering tests and/or medications, and provide counseling the patient    Electronically signed by:  Rosiland Oz, MD Infectious Disease Physician [facility] [address] [place], [place] [address] Phone: [phone]  Fax: [phone]

## 2020-12-02 NOTE — Telephone Encounter (Signed)
Informed Melissa at [facility] of verbal orders per Dr.Manandhar - Extend IV antibiotics until 12/17/2020 and Pull PICC after last dose. Melissa repeated verbal orders back and voiced her understanding.    [place], CMA

## 2020-12-03 ENCOUNTER — Encounter (HOSPITAL_COMMUNITY): Payer: Self-pay

## 2020-12-03 ENCOUNTER — Encounter: Payer: Self-pay | Admitting: Adult Health

## 2020-12-03 LAB — SEDIMENTATION RATE: Sed Rate: 34 mm/h — ABNORMAL HIGH (ref 0–20)

## 2020-12-03 LAB — C-REACTIVE PROTEIN: CRP: 6.8 mg/L (ref <8.0)

## 2020-12-03 NOTE — Progress Notes (Signed)
     [address]       [place] [address]             [phone]      [name] comes in for wound check.  He previously undergone a coronary artery bypass grafting with harvesting of bilateral mammary arteries by Dr. 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  He was taken to the operating theater for debridement of this and wound VAC placement.  Overall he is doing well.  He denies any fevers and chills.  He remains on his antibiotics.  He continues to have his wound packed here in the office.  The wound currently measures 4-1/2 x 1-1/2 cm.  There is a good granulation base.  There is no evidence of purulence.   We will attempt to arrange home health for him given that he does not have much assistance at home.  He is scheduled to return in 2 days for wound VAC change by our nurses.  He will have an appointment with our APP clinic in 2 weeks for further treatment of his wound.  Matthew [place]

## 2020-12-04 ENCOUNTER — Other Ambulatory Visit: Payer: Self-pay | Admitting: Adult Health

## 2020-12-04 ENCOUNTER — Telehealth: Payer: Self-pay | Admitting: Internal Medicine

## 2020-12-04 DIAGNOSIS — K2100 Gastro-esophageal reflux disease with esophagitis, without bleeding: Secondary | ICD-10-CM

## 2020-12-04 DIAGNOSIS — I872 Venous insufficiency (chronic) (peripheral): Secondary | ICD-10-CM

## 2020-12-04 NOTE — Telephone Encounter (Signed)
Routing to Pharm D for advice regarding this medication due to it is also in the class of fluoroquinolones.

## 2020-12-04 NOTE — Telephone Encounter (Signed)
    Pt c/o medication issue:  1. Name of Medication: moxifloxacin eye drops .05 mg  2. How are you currently taking this medication (dosage and times per day)?   3. Are you having a reaction (difficulty breathing--STAT)?   4. What is your medication issue? Pt wanted to know if its ok for him to use this meds

## 2020-12-04 NOTE — Telephone Encounter (Signed)
Already addressed in MyChart message. Message sent to a few other providers as well, ID has also cleared him to take. Ophthalmic fluoroquinolones not absorbed systemically like oral ones are, he is safe to take.

## 2020-12-04 NOTE — Telephone Encounter (Signed)
All good. No drug interactions.

## 2020-12-05 ENCOUNTER — Ambulatory Visit (INDEPENDENT_AMBULATORY_CARE_PROVIDER_SITE_OTHER): Payer: Self-pay

## 2020-12-05 ENCOUNTER — Other Ambulatory Visit: Payer: Self-pay

## 2020-12-05 DIAGNOSIS — Z4889 Encounter for other specified surgical aftercare: Secondary | ICD-10-CM

## 2020-12-06 ENCOUNTER — Encounter: Payer: BC Managed Care – PPO | Admitting: Thoracic Surgery (Cardiothoracic Vascular Surgery)

## 2020-12-06 LAB — ACID FAST CULTURE WITH REFLEXED SENSITIVITIES (MYCOBACTERIA): Acid Fast Culture: NEGATIVE

## 2020-12-07 LAB — ACID FAST CULTURE WITH REFLEXED SENSITIVITIES (MYCOBACTERIA): Acid Fast Culture: NEGATIVE

## 2020-12-09 ENCOUNTER — Ambulatory Visit (INDEPENDENT_AMBULATORY_CARE_PROVIDER_SITE_OTHER): Payer: Self-pay | Admitting: *Deleted

## 2020-12-09 ENCOUNTER — Other Ambulatory Visit: Payer: Self-pay

## 2020-12-09 DIAGNOSIS — Z4802 Encounter for removal of sutures: Secondary | ICD-10-CM

## 2020-12-09 DIAGNOSIS — Z4801 Encounter for change or removal of surgical wound dressing: Secondary | ICD-10-CM

## 2020-12-09 NOTE — Progress Notes (Signed)
Patient arrived for wound vac change. Old vac sponge removed. Measures 3cm L x 1.5cm W x 1cm D. Pink tissue in wound bed. New vac sponge placed without difficulty. Patient tolerated vac change well. Patient will return Thursday for next sponge change.

## 2020-12-10 ENCOUNTER — Encounter: Payer: Self-pay | Admitting: Infectious Diseases

## 2020-12-11 ENCOUNTER — Encounter: Payer: Self-pay | Admitting: Infectious Diseases

## 2020-12-11 NOTE — Progress Notes (Signed)
Labs 12/03/20  WBC 8.3, hb 10.3, plts 372, cr 1.35

## 2020-12-12 ENCOUNTER — Other Ambulatory Visit: Payer: Self-pay

## 2020-12-12 ENCOUNTER — Ambulatory Visit (INDEPENDENT_AMBULATORY_CARE_PROVIDER_SITE_OTHER): Payer: Self-pay | Admitting: *Deleted

## 2020-12-12 DIAGNOSIS — Z4802 Encounter for removal of sutures: Secondary | ICD-10-CM

## 2020-12-12 DIAGNOSIS — Z4801 Encounter for change or removal of surgical wound dressing: Secondary | ICD-10-CM

## 2020-12-12 NOTE — Progress Notes (Signed)
Patient arrived for wound vac change. Old vac sponge removed. Measures 2.9mL x 1cm W x 0.8 cm D. Pink tissue in wound bed. New vac sponge placed without difficulty. Patient tolerated vac change well. Patient will return Monday for next sponge change and exam by PA.

## 2020-12-16 ENCOUNTER — Ambulatory Visit (INDEPENDENT_AMBULATORY_CARE_PROVIDER_SITE_OTHER): Payer: Self-pay | Admitting: Surgical

## 2020-12-16 ENCOUNTER — Other Ambulatory Visit: Payer: Self-pay

## 2020-12-16 VITALS — BP 119/67 | HR 70 | Resp 20 | Ht 72.0 in

## 2020-12-16 DIAGNOSIS — Z951 Presence of aortocoronary bypass graft: Secondary | ICD-10-CM

## 2020-12-16 NOTE — Progress Notes (Signed)
[address]       [place] [address]             [phone]      Matthew [place] Medical Record [id number] Date of Birth: [birth year]  Referring: Troy Sine, MD Primary Care: Dorothyann Peng, NP Primary Cardiologist: Werner Lean, MD   Chief Complaint:   POST OP FOLLOW UP  History of Present Illness:    Sternotomy incision wound continues to heal well with excellent granulation tissue.  And now less than 1 cm deep and I feel like the dressing can now be changed to normal saline wet-to-dry 2 x 2's as it would be difficult to get a VAC sponge and at this point.      Past Medical History:  Diagnosis Date   Blood in urine    Class 2 obesity 09/30/2020   DDD (degenerative disc disease)    DVT (deep venous thrombosis) ([place]) 2017 & 2018   Gout    Heart murmur    Hyperlipidemia    Hypertension    Numbness and tingling    right arm and hand   SCC (squamous cell carcinoma)    skin, squamous cell, face     Social History   Tobacco Use  Smoking Status Former   Packs/day: 2.00   Years: 36.00   Pack years: 72.00   Types: Cigarettes   Quit date: 01/01/2020   Years since quitting: 0.9  Smokeless Tobacco Never    Social History   Substance and Sexual Activity  Alcohol Use Yes   Alcohol/week: 12.0 standard drinks   Types: 12 Standard drinks or equivalent per week   Comment: 2 per day     Allergies  Allergen Reactions   Levaquin [Levofloxacin]     Aortic Aneurysm    Current Outpatient Medications  Medication Sig Dispense Refill   acetaminophen (TYLENOL) 325 MG tablet Take 2 tablets (650 mg total) by mouth every 6 (six) hours as needed for fever.     allopurinol (ZYLOPRIM) 300 MG tablet TAKE 1 TABLET BY MOUTH EVERY DAY 90 tablet 0   amiodarone (PACERONE) 200 MG tablet Take 1 tablet (200 mg total) by mouth daily. 30 tablet 1   aspirin EC 81 MG EC tablet Take 1 tablet (81 mg total) by mouth daily. Swallow whole. 30  tablet 11   atorvastatin (LIPITOR) 80 MG tablet Take 1 tablet (80 mg total) by mouth daily. 30 tablet 1   colchicine 0.6 MG tablet Take 0.6 mg by mouth daily as needed (Flair up gout).      dapagliflozin propanediol (FARXIGA) 10 MG TABS tablet Take 1 tablet (10 mg total) by mouth daily before breakfast. 30 tablet 3   ertapenem 1,000 mg in sodium chloride 0.9 % 100 mL Inject 1,000 mg into the vein daily. 1 g 32   ferrous Q000111Q C-folic acid (TRINSICON / FOLTRIN) capsule Take 1 capsule by mouth daily.     metoprolol succinate (TOPROL-XL) 25 MG 24 hr tablet Take 1 tablet (25 mg total) by mouth daily. Take with or immediately following a meal. 30 tablet 1   oxyCODONE (OXY IR/ROXICODONE) 5 MG immediate release tablet Take 1 tablet (5 mg total) by mouth every 6 (six) hours as needed for severe pain. 24 tablet 0   rivaroxaban (XARELTO) 20 MG TABS tablet Take 1 tablet (20 mg total) by mouth daily with supper. 90 tablet 3   torsemide (DEMADEX) 20 MG tablet  Take 1 tablet (20 mg total) by mouth daily. 30 tablet 1   No current facility-administered medications for this visit.       Physical Exam: Ht 6' (1.829 m)   BMI 37.43 kg/m   Wound: As above   Diagnostic Studies & Laboratory data:     Recent Radiology Findings:   No results found.    Recent Lab Findings: Lab Results  Component Value Date   WBC 8.0 10/24/2020   HGB 7.6 (L) 10/24/2020   HCT 24.3 (L) 10/24/2020   PLT 257 10/24/2020   GLUCOSE 94 11/21/2020   CHOL 114 07/08/2020   TRIG 74 07/08/2020   HDL 50 07/08/2020   LDLDIRECT 135.1 04/06/2011   LDLCALC 49 07/08/2020   ALT 26 10/15/2020   AST 28 10/15/2020   NA 139 11/21/2020   K 4.0 11/21/2020   CL 104 11/21/2020   CREATININE 1.59 (H) 11/21/2020   BUN 27 (H) 11/21/2020   CO2 26 11/21/2020   TSH 0.91 07/31/2020   INR 1.3 (H) 10/03/2020   HGBA1C 5.6 10/02/2020      Assessment / Plan: Wet-to-dry normal saline 2 x 2 daily.  Patient instructed on how to do  wound care.  We will see again in 4 weeks or earlier than that should he have any difficulties.      Medication Changes: No orders of the defined types were placed in this encounter.    John Giovanni, PA-C  12/16/2020 4:16 PM

## 2020-12-18 ENCOUNTER — Encounter: Payer: Self-pay | Admitting: Adult Health

## 2020-12-18 ENCOUNTER — Telehealth (HOSPITAL_COMMUNITY): Payer: Self-pay

## 2020-12-18 ENCOUNTER — Encounter (HOSPITAL_COMMUNITY): Payer: Self-pay

## 2020-12-18 NOTE — Telephone Encounter (Signed)
Pt called wanted to know if he could start the cardiac rehab with [place] and switch to cardiac rehab in [place]. Pt's referral was originally sent to [place] as requested by the pt but per the pt his doctors office advised him not  to travel right now. I advised pt that he was unable to start with [place] and then transfer to [place] because we were not connected with them. I advised pt that if he wanted to come to our location he would have to get his doctor to send [place] a referral. I also advised pt that we have a 1-3 month backlog. Pt stated he will contact the cardiac rehab in [place].

## 2020-12-19 ENCOUNTER — Other Ambulatory Visit: Payer: Self-pay

## 2020-12-19 NOTE — Progress Notes (Signed)
Order for Outpatient Cardiac rehab placed.

## 2020-12-20 ENCOUNTER — Telehealth (HOSPITAL_COMMUNITY): Payer: Self-pay

## 2020-12-20 NOTE — Telephone Encounter (Signed)
Called and spoke with pt in regards to CR, pt stated he is schedule for CR in [place] Sep. 30th. And that he is unable to participate due to his work schedule.   Closed referral

## 2020-12-22 ENCOUNTER — Other Ambulatory Visit: Payer: Self-pay | Admitting: Adult Health

## 2020-12-22 DIAGNOSIS — I872 Venous insufficiency (chronic) (peripheral): Secondary | ICD-10-CM

## 2020-12-24 ENCOUNTER — Other Ambulatory Visit: Payer: Self-pay | Admitting: Physician Assistant

## 2020-12-24 DIAGNOSIS — I10 Essential (primary) hypertension: Secondary | ICD-10-CM

## 2020-12-24 DIAGNOSIS — I4891 Unspecified atrial fibrillation: Secondary | ICD-10-CM

## 2020-12-25 ENCOUNTER — Other Ambulatory Visit: Payer: Self-pay | Admitting: Physician Assistant

## 2020-12-26 ENCOUNTER — Other Ambulatory Visit: Payer: Self-pay | Admitting: Physician Assistant

## 2020-12-30 ENCOUNTER — Telehealth: Payer: Self-pay | Admitting: *Deleted

## 2020-12-30 NOTE — Telephone Encounter (Signed)
Patient contacted the office wanting to change his wound follow up appt scheduled for 9/27. Per patient, he is going out of town on 9/19 and may potentially be out of town for a couple of months. Appt rescheduled for 9/15. Patient aware.

## 2020-12-31 ENCOUNTER — Encounter (HOSPITAL_COMMUNITY): Payer: Self-pay

## 2020-12-31 ENCOUNTER — Other Ambulatory Visit: Payer: Self-pay | Admitting: Physician Assistant

## 2020-12-31 ENCOUNTER — Encounter: Payer: Self-pay | Admitting: Adult Health

## 2020-12-31 ENCOUNTER — Other Ambulatory Visit (HOSPITAL_COMMUNITY): Payer: Self-pay | Admitting: *Deleted

## 2020-12-31 DIAGNOSIS — I10 Essential (primary) hypertension: Secondary | ICD-10-CM

## 2020-12-31 DIAGNOSIS — M109 Gout, unspecified: Secondary | ICD-10-CM

## 2020-12-31 DIAGNOSIS — I4891 Unspecified atrial fibrillation: Secondary | ICD-10-CM

## 2021-01-01 ENCOUNTER — Other Ambulatory Visit: Payer: Self-pay | Admitting: Physician Assistant

## 2021-01-01 MED ORDER — ATORVASTATIN CALCIUM 80 MG PO TABS: 80.0000 mg | ORAL_TABLET | Freq: Every day | ORAL | 1 refills | Status: DC

## 2021-01-01 MED ORDER — DAPAGLIFLOZIN PROPANEDIOL 10 MG PO TABS
10.0000 mg | ORAL_TABLET | Freq: Every day | ORAL | 1 refills | Status: DC
Start: 2021-01-01 — End: 2021-07-02

## 2021-01-01 MED ORDER — METOPROLOL SUCCINATE ER 25 MG PO TB24: 25.0000 mg | ORAL_TABLET | Freq: Every day | ORAL | 1 refills | Status: DC

## 2021-01-01 MED ORDER — AMIODARONE HCL 200 MG PO TABS: 200.0000 mg | ORAL_TABLET | Freq: Every day | ORAL | 1 refills | Status: DC

## 2021-01-01 NOTE — Progress Notes (Signed)
[address]       [place] [address]             [phone]      Jayden S [place]anc[place] [facility] Medical Record [id number] Date of Birth: [birth year]  Referring: Troy Sine, MD Primary Care: Dorothyann Peng, NP Primary Cardiologist: Werner Lean, MD   Chief Complaint:   POST OP FOLLOW UP  History of Present Illness:      The patient was last seen on 12/16/2020 by Jadene Pierini, PA-C.  At that time his sternal incision wound was healing well with excellent granulation tissue.  It was less than 1 cm deep and the dressing was changed to normal saline wet-to-dry 2 x 2's as it would be difficult to get a VAC sponge and at this point.   He returns the office today for a wound check and follow-up visit.      Past Medical History:  Diagnosis Date   Blood in urine    Class 2 obesity 09/30/2020   DDD (degenerative disc disease)    DVT (deep venous thrombosis) ([place]) 2017 & 2018   Gout    Heart murmur    Hyperlipidemia    Hypertension    Numbness and tingling    right arm and hand   SCC (squamous cell carcinoma)    skin, squamous cell, face     Social History   Tobacco Use  Smoking Status Former   Packs/day: 2.00   Years: 36.00   Pack years: 72.00   Types: Cigarettes   Quit date: 01/01/2020   Years since quitting: 0.9  Smokeless Tobacco Never    Social History   Substance and Sexual Activity  Alcohol Use Yes   Alcohol/week: 12.0 standard drinks   Types: 12 Standard drinks or equivalent per week   Comment: 2 per day     Allergies  Allergen Reactions   Levaquin [Levofloxacin]     Aortic Aneurysm    Current Outpatient Medications  Medication Sig Dispense Refill   acetaminophen (TYLENOL) 325 MG tablet Take 2 tablets (650 mg total) by mouth every 6 (six) hours as needed for fever.     allopurinol (ZYLOPRIM) 300 MG tablet TAKE 1 TABLET BY MOUTH EVERY DAY 90 tablet 0   amiodarone (PACERONE) 200 MG tablet Take 1 tablet (200 mg total)  by mouth daily. 30 tablet 1   aspirin EC 81 MG EC tablet Take 1 tablet (81 mg total) by mouth daily. Swallow whole. 30 tablet 11   atorvastatin (LIPITOR) 80 MG tablet Take 1 tablet (80 mg total) by mouth daily. 30 tablet 1   colchicine 0.6 MG tablet Take 0.6 mg by mouth daily as needed (Flair up gout).      dapagliflozin propanediol (FARXIGA) 10 MG TABS tablet Take 1 tablet (10 mg total) by mouth daily before breakfast. 30 tablet 3   ertapenem 1,000 mg in sodium chloride 0.9 % 100 mL Inject 1,000 mg into the vein daily. 1 g 32   ferrous Q000111Q C-folic acid (TRINSICON / FOLTRIN) capsule Take 1 capsule by mouth daily.     metoprolol succinate (TOPROL-XL) 25 MG 24 hr tablet Take 1 tablet (25 mg total) by mouth daily. Take with or immediately following a meal. 30 tablet 1   oxyCODONE (OXY IR/ROXICODONE) 5 MG immediate release tablet Take 1 tablet (5 mg total) by mouth every 6 (six) hours as needed for severe pain. 24 tablet 0   rivaroxaban (  XARELTO) 20 MG TABS tablet Take 1 tablet (20 mg total) by mouth daily with supper. 90 tablet 3   torsemide (DEMADEX) 20 MG tablet Take 1 tablet (20 mg total) by mouth daily. 30 tablet 1   No current facility-administered medications for this visit.       Physical Exam: Cor: RRR, grade 2/6 systolic murmur Pulm: CTA bilaterally and in all fields Wound: clean and dry, slight indentation with small amount of yellow crust  Ext: 2-3 pitting edema L > R    Diagnostic Studies & Laboratory data:     Recent Radiology Findings:   No results found.    Recent Lab Findings: Lab Results  Component Value Date   WBC 8.0 10/24/2020   HGB 7.6 (L) 10/24/2020   HCT 24.3 (L) 10/24/2020   PLT 257 10/24/2020   GLUCOSE 94 11/21/2020   CHOL 114 07/08/2020   TRIG 74 07/08/2020   HDL 50 07/08/2020   LDLDIRECT 135.1 04/06/2011   LDLCALC 49 07/08/2020   ALT 26 10/15/2020   AST 28 10/15/2020   NA 139 11/21/2020   K 4.0 11/21/2020   CL 104 11/21/2020    CREATININE 1.59 (H) 11/21/2020   BUN 27 (H) 11/21/2020   CO2 26 11/21/2020   TSH 0.91 07/31/2020   INR 1.3 (H) 10/03/2020   HGBA1C 5.6 10/02/2020      Assessment / Plan:   His incision has healed well, no need to continue to pack. I encouraged him to keep it clean and dry. He can apply betadine to the incision if needed. He did have issues getting some of his medications filled. I will leave this to heart failure from now on. I did refill his Demedex today since he will run out this weekend and he is leaving for [place] on Monday morning. I do not want him to run out.   Plan: I think his incision is well healed. He can follow-up with our practice as needed.       Medication Changes: Refill on Demadex '20mg'$  x 30 tabs    Nicholes Rough, [place] [phone]

## 2021-01-02 ENCOUNTER — Ambulatory Visit (INDEPENDENT_AMBULATORY_CARE_PROVIDER_SITE_OTHER): Payer: BC Managed Care – PPO | Admitting: Physician Assistant

## 2021-01-02 ENCOUNTER — Encounter: Payer: Self-pay | Admitting: Physician Assistant

## 2021-01-02 ENCOUNTER — Other Ambulatory Visit: Payer: Self-pay

## 2021-01-02 DIAGNOSIS — Z5189 Encounter for other specified aftercare: Secondary | ICD-10-CM | POA: Insufficient documentation

## 2021-01-02 MED ORDER — TORSEMIDE 20 MG PO TABS: 20.0000 mg | ORAL_TABLET | Freq: Every day | ORAL | 0 refills | Status: DC

## 2021-01-02 MED ORDER — ALLOPURINOL 300 MG PO TABS: 300.0000 mg | ORAL_TABLET | Freq: Every day | ORAL | 0 refills | Status: DC

## 2021-01-14 ENCOUNTER — Ambulatory Visit: Payer: BC Managed Care – PPO

## 2021-01-23 ENCOUNTER — Other Ambulatory Visit: Payer: Self-pay | Admitting: Adult Health

## 2021-01-23 DIAGNOSIS — I872 Venous insufficiency (chronic) (peripheral): Secondary | ICD-10-CM

## 2021-01-23 NOTE — Telephone Encounter (Signed)
Looks like Rx was D/C by another provider

## 2021-01-24 MED ORDER — TORSEMIDE 20 MG PO TABS: 20.0000 mg | ORAL_TABLET | Freq: Every day | ORAL | 3 refills | Status: DC

## 2021-01-24 NOTE — Addendum Note (Signed)
Addended by: Precious Gilding on: 01/24/2021 11:02 AM   Modules accepted: Orders

## 2021-01-24 NOTE — Telephone Encounter (Signed)
Lab order released to be drawn at location of pt choice.

## 2021-01-31 LAB — BASIC METABOLIC PANEL
BUN/Creatinine Ratio: 14 (ref 10–24)
BUN: 24 mg/dL (ref 8–27)
CO2: 25 mmol/L (ref 20–29)
Calcium: 9.6 mg/dL (ref 8.6–10.2)
Chloride: 99 mmol/L (ref 96–106)
Creatinine, Ser: 1.68 mg/dL — ABNORMAL HIGH (ref 0.76–1.27)
Glucose: 90 mg/dL (ref 70–99)
Potassium: 4.4 mmol/L (ref 3.5–5.2)
Sodium: 139 mmol/L (ref 134–144)
eGFR: 46 mL/min/{1.73_m2} — ABNORMAL LOW (ref >59)

## 2021-02-10 ENCOUNTER — Encounter (HOSPITAL_COMMUNITY): Payer: BC Managed Care – PPO | Admitting: Internal Medicine

## 2021-04-09 ENCOUNTER — Encounter (HOSPITAL_COMMUNITY): Payer: BC Managed Care – PPO | Admitting: Internal Medicine
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
## 2021-04-16 ENCOUNTER — Encounter (HOSPITAL_COMMUNITY): Payer: Self-pay

## 2021-04-16 ENCOUNTER — Other Ambulatory Visit: Payer: Self-pay

## 2021-04-16 ENCOUNTER — Ambulatory Visit (HOSPITAL_COMMUNITY)
Admission: RE | Admit: 2021-04-16 | Discharge: 2021-04-16 | Disposition: A | Discharge status: Home / Self Care | Payer: BC Managed Care – PPO | Source: Ambulatory Visit | Attending: Family Medicine | Admitting: Family Medicine

## 2021-04-16 VITALS — BP 140/70 | HR 69 | Wt 304.2 lb

## 2021-04-16 DIAGNOSIS — E785 Hyperlipidemia, unspecified: Secondary | ICD-10-CM | POA: Diagnosis not present

## 2021-04-16 DIAGNOSIS — I825Z2 Chronic embolism and thrombosis of unspecified deep veins of left distal lower extremity: Secondary | ICD-10-CM

## 2021-04-16 DIAGNOSIS — K219 Gastro-esophageal reflux disease without esophagitis: Secondary | ICD-10-CM | POA: Diagnosis not present

## 2021-04-16 DIAGNOSIS — I110 Hypertensive heart disease with heart failure: Secondary | ICD-10-CM | POA: Diagnosis not present

## 2021-04-16 DIAGNOSIS — Z7984 Long term (current) use of oral hypoglycemic drugs: Secondary | ICD-10-CM | POA: Diagnosis not present

## 2021-04-16 DIAGNOSIS — I252 Old myocardial infarction: Secondary | ICD-10-CM | POA: Diagnosis not present

## 2021-04-16 DIAGNOSIS — D62 Acute posthemorrhagic anemia: Secondary | ICD-10-CM | POA: Diagnosis not present

## 2021-04-16 DIAGNOSIS — Z79899 Other long term (current) drug therapy: Secondary | ICD-10-CM | POA: Insufficient documentation

## 2021-04-16 DIAGNOSIS — I5042 Chronic combined systolic (congestive) and diastolic (congestive) heart failure: Secondary | ICD-10-CM | POA: Insufficient documentation

## 2021-04-16 DIAGNOSIS — Z87891 Personal history of nicotine dependence: Secondary | ICD-10-CM | POA: Diagnosis not present

## 2021-04-16 DIAGNOSIS — I359 Nonrheumatic aortic valve disorder, unspecified: Secondary | ICD-10-CM

## 2021-04-16 DIAGNOSIS — I2510 Atherosclerotic heart disease of native coronary artery without angina pectoris: Secondary | ICD-10-CM | POA: Diagnosis not present

## 2021-04-16 DIAGNOSIS — Z6841 Body Mass Index (BMI) 40.0 and over, adult: Secondary | ICD-10-CM | POA: Diagnosis not present

## 2021-04-16 DIAGNOSIS — J90 Pleural effusion, not elsewhere classified: Secondary | ICD-10-CM | POA: Insufficient documentation

## 2021-04-16 DIAGNOSIS — I483 Typical atrial flutter: Secondary | ICD-10-CM

## 2021-04-16 DIAGNOSIS — Z7982 Long term (current) use of aspirin: Secondary | ICD-10-CM | POA: Insufficient documentation

## 2021-04-16 DIAGNOSIS — I4891 Unspecified atrial fibrillation: Secondary | ICD-10-CM | POA: Diagnosis not present

## 2021-04-16 DIAGNOSIS — I4892 Unspecified atrial flutter: Secondary | ICD-10-CM | POA: Insufficient documentation

## 2021-04-16 DIAGNOSIS — Z951 Presence of aortocoronary bypass graft: Secondary | ICD-10-CM | POA: Insufficient documentation

## 2021-04-16 DIAGNOSIS — I350 Nonrheumatic aortic (valve) stenosis: Secondary | ICD-10-CM | POA: Diagnosis not present

## 2021-04-16 DIAGNOSIS — G4733 Obstructive sleep apnea (adult) (pediatric): Secondary | ICD-10-CM | POA: Diagnosis not present

## 2021-04-16 DIAGNOSIS — E669 Obesity, unspecified: Secondary | ICD-10-CM | POA: Diagnosis not present

## 2021-04-16 DIAGNOSIS — I5020 Unspecified systolic (congestive) heart failure: Secondary | ICD-10-CM | POA: Diagnosis not present

## 2021-04-16 DIAGNOSIS — Q211 Atrial septal defect, unspecified: Secondary | ICD-10-CM

## 2021-04-16 DIAGNOSIS — Z09 Encounter for follow-up examination after completed treatment for conditions other than malignant neoplasm: Secondary | ICD-10-CM | POA: Diagnosis not present

## 2021-04-16 DIAGNOSIS — Z7901 Long term (current) use of anticoagulants: Secondary | ICD-10-CM | POA: Insufficient documentation

## 2021-04-16 DIAGNOSIS — Z86718 Personal history of other venous thrombosis and embolism: Secondary | ICD-10-CM | POA: Insufficient documentation

## 2021-04-16 LAB — CBC
HCT: 37.4 % — ABNORMAL LOW (ref 39.0–52.0)
Hemoglobin: 12.3 g/dL — ABNORMAL LOW (ref 13.0–17.0)
MCH: 33.5 pg (ref 26.0–34.0)
MCHC: 32.9 g/dL (ref 30.0–36.0)
MCV: 101.9 fL — ABNORMAL HIGH (ref 80.0–100.0)
Platelets: 264 10*3/uL (ref 150–400)
RBC: 3.67 MIL/uL — ABNORMAL LOW (ref 4.22–5.81)
RDW: 15 % (ref 11.5–15.5)
WBC: 8.5 10*3/uL (ref 4.0–10.5)
nRBC: 0 % (ref 0.0–0.2)

## 2021-04-16 LAB — COMPREHENSIVE METABOLIC PANEL
ALT: 37 U/L (ref 0–44)
AST: 28 U/L (ref 15–41)
Albumin: 3.6 g/dL (ref 3.5–5.0)
Alkaline Phosphatase: 94 U/L (ref 38–126)
Anion gap: 7 (ref 5–15)
BUN: 45 mg/dL — ABNORMAL HIGH (ref 6–20)
CO2: 24 mmol/L (ref 22–32)
Calcium: 8.9 mg/dL (ref 8.9–10.3)
Chloride: 103 mmol/L (ref 98–111)
Creatinine, Ser: 1.79 mg/dL — ABNORMAL HIGH (ref 0.61–1.24)
GFR, Estimated: 43 mL/min — ABNORMAL LOW (ref >60)
Glucose, Bld: 98 mg/dL (ref 70–99)
Potassium: 4.3 mmol/L (ref 3.5–5.1)
Sodium: 134 mmol/L — ABNORMAL LOW (ref 135–145)
Total Bilirubin: 0.6 mg/dL (ref 0.3–1.2)
Total Protein: 7.1 g/dL (ref 6.5–8.1)

## 2021-04-16 LAB — TSH: TSH: 1.74 u[IU]/mL (ref 0.350–4.500)

## 2021-04-16 NOTE — Addendum Note (Signed)
Encounter addended by: Kerry Dory, CMA on: 04/16/2021 1:50 PM  Actions taken: Order list changed, Diagnosis association updated

## 2021-04-16 NOTE — Patient Instructions (Signed)
It was great to see you today! No medication changes are needed at this time.  Labs today We will only contact you if something comes back abnormal or we need to make some changes. Otherwise no news is good news!  You have been referred to [facility] -they will be in contact with an appointment  Your physician recommends that you schedule a follow-up appointment in: 3 months with Dr Haroldine Laws  Do the following things EVERYDAY: Weigh yourself in the morning before breakfast. Write it down and keep it in a log. Take your medicines as prescribed Eat low salt foods--Limit salt (sodium) to 2000 mg per day.  Stay as active as you can everyday Limit all fluids for the day to less than 2 liters  At the [facility], you and your health needs are our priority. As part of our continuing mission to provide you with exceptional heart care, we have created designated Provider Care Teams. These Care Teams include your primary Cardiologist (physician) and Advanced Practice Providers (APPs- Physician Assistants and Nurse Practitioners) who all work together to provide you with the care you need, when you need it.   You may see any of the following providers on your designated Care Team at your next follow up: Dr Glori Bickers Dr Haynes Kerns, NP Lyda Jester, [place] [facility] [place], [place] Audry Riles, PharmD   Please be sure to bring in all your medications bottles to every appointment.

## 2021-04-17 ENCOUNTER — Telehealth (HOSPITAL_COMMUNITY): Payer: Self-pay

## 2021-04-17 DIAGNOSIS — I5020 Unspecified systolic (congestive) heart failure: Secondary | ICD-10-CM

## 2021-04-17 MED ORDER — TORSEMIDE 10 MG PO TABS: 10.0000 mg | ORAL_TABLET | Freq: Every day | ORAL | 4 refills | Status: DC

## 2021-04-17 NOTE — Telephone Encounter (Signed)
-----   Message from Rafael Bihari, [place] sent at 04/16/2021  5:05 PM EST ----- SCr and BUN elevated. Please decrease torsemide to 10 mg daily and repeat BMET in 2 weeks

## 2021-04-17 NOTE — Telephone Encounter (Addendum)
Pt aware, agreeable, and verbalized understanding Script sent to pharmacy  ----- Message from Rafael Bihari, FNP sent at 04/16/2021  5:05 PM EST ----- SCr and BUN elevated. Please decrease torsemide to 10 mg daily and repeat BMET in 2 weeks

## 2021-04-29 NOTE — Addendum Note (Signed)
Encounter addended by: Kerry Dory, CMA on: 04/29/2021 10:47 AM  Actions taken: Charge Capture section accepted

## 2021-04-29 NOTE — Addendum Note (Signed)
Encounter addended by: Scarlette Calico, RN on: 04/29/2021 10:48 AM  Actions taken: Charge Capture section accepted

## 2021-05-06 NOTE — Progress Notes (Signed)
PCP:  Dorothyann Peng, NP Primary Cardiologist: Werner Lean, MD Electrophysiologist: Virl Axe, MD   Matthew Savage is a 61 y.o. male seen today for Virl Axe, MD for routine electrophysiology followup.  Since last being seen in our clinic the patient reports doing OK. 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 No lightheadedness, chest pain, syncope, or near syncope. Feels a little better since med adjustment made by HF clinic 12/28.  Has not noticed being out of rhythm. Unclear chronicity.   Past Medical History:  Diagnosis Date   Blood in urine    Class 2 obesity 09/30/2020   DDD (degenerative disc disease)    DVT (deep venous thrombosis) ([place]) 2017 & 2018   Gout    Heart murmur    Hyperlipidemia    Hypertension    Numbness and tingling    right arm and hand   SCC (squamous cell carcinoma)    skin, squamous cell, face   Past Surgical History:  Procedure Laterality Date   A-FLUTTER ABLATION N/A 03/11/2020   Procedure: A-FLUTTER ABLATION;  Surgeon: Deboraha Sprang, MD;  Location: [place] [facility];  Service: Cardiovascular;  Laterality: N/A;   APPLICATION OF WOUND VAC  10/18/2020   Procedure: APPLICATION OF WOUND VAC;  Surgeon: Lajuana Matte, MD;  Location: [place];  Service: Thoracic;;   ASD REPAIR N/A 10/03/2020   Procedure: ATRIAL SEPTAL DEFECT (ASD) REPAIR WITH PERI GUARD PERICARDIUM;  Surgeon: Wonda Olds, MD;  Location: [place];  Service: Open Heart Surgery;  Laterality: N/A;   CLIPPING OF ATRIAL APPENDAGE Left 10/03/2020   Procedure: CLIPPING OF ATRIAL APPENDAGE WITH ATRICURE 69 CLIP;  Surgeon: Wonda Olds, MD;  Location: [place];  Service: Open Heart Surgery;  Laterality: Left;   CORONARY ARTERY BYPASS GRAFT N/A 10/03/2020   Procedure: CORONARY ARTERY BYPASS GRAFTING (CABG) X FOUR, USING BILATERAL MAMMARY ARTERIES AND RIGHT ENDOSCOPIC GREATER SAPHENOUS VEIN CONDUITS;  Surgeon: Wonda Olds, MD;  Location: [place];  Service: Open Heart  Surgery;  Laterality: N/A;   HERNIA REPAIR  1967   X 2   IR THORACENTESIS ASP PLEURAL SPACE W/IMG GUIDE  10/28/2020   LAPAROSCOPIC GASTRIC BANDING  03/2009   in [place], [place] CATH AND CORONARY ANGIOGRAPHY N/A 10/01/2020   Procedure: LEFT HEART CATH AND CORONARY ANGIOGRAPHY;  Surgeon: Troy Sine, MD;  Location: [place] [facility];  Service: Cardiovascular;  Laterality: N/A;   MASS EXCISION  12/03/2011   X 2 (gluteal) X 1 (lower back)   MOHS SURGERY     x 2   STERNAL WOUND DEBRIDEMENT N/A 10/18/2020   Procedure: STERNAL WOUND DEBRIDEMENT;  Surgeon: Lajuana Matte, MD;  Location: [place];  Service: Thoracic;  Laterality: N/A;   STERNAL WOUND DEBRIDEMENT N/A 10/23/2020   Procedure: WOUND VAC CHANGE with APPLICATION OF MYRIAD MORCELLS;  Surgeon: Wonda Olds, MD;  Location: [place];  Service: Thoracic;  Laterality: N/A;   TEE WITHOUT CARDIOVERSION N/A 10/03/2020   Procedure: TRANSESOPHAGEAL ECHOCARDIOGRAM (TEE);  Surgeon: Wonda Olds, MD;  Location: [place];  Service: Open Heart Surgery;  Laterality: N/A;    Current Outpatient Medications  Medication Sig Dispense Refill   acetaminophen (TYLENOL) 325 MG tablet Take 2 tablets (650 mg total) by mouth every 6 (six) hours as needed for fever.     allopurinol (ZYLOPRIM) 300 MG tablet Take 1 tablet (300 mg total) by mouth daily. 90 tablet 0  amiodarone (PACERONE) 200 MG tablet Take 1 tablet (200 mg total) by mouth daily. 90 tablet 1   aspirin EC 81 MG EC tablet Take 1 tablet (81 mg total) by mouth daily. Swallow whole. 30 tablet 11   atorvastatin (LIPITOR) 80 MG tablet Take 1 tablet (80 mg total) by mouth daily. 90 tablet 1   cetirizine (ZYRTEC) 10 MG tablet Take by mouth.     colchicine 0.6 MG tablet Take 0.6 mg by mouth daily as needed (Flair up gout).      dapagliflozin propanediol (FARXIGA) 10 MG TABS tablet Take 1 tablet (10 mg total) by mouth daily before breakfast. 90 tablet 1   ferrous [id number] C-folic acid  (TRINSICON / FOLTRIN) capsule Take 1 capsule by mouth daily.     gentamicin-prednisoLONE 0.3-1 % ophthalmic drops 1 drop 1 (one) time each day.     metoprolol succinate (TOPROL-XL) 25 MG 24 hr tablet Take 1 tablet (25 mg total) by mouth daily. Take with or immediately following a meal. 90 tablet 1   moxifloxacin (VIGAMOX) 0.5 % ophthalmic solution Place 1 drop into the left eye 4 (four) times daily.     oxyCODONE (OXY IR/ROXICODONE) 5 MG immediate release tablet Take 1 tablet (5 mg total) by mouth every 6 (six) hours as needed for severe pain. 24 tablet 0   rivaroxaban (XARELTO) 20 MG TABS tablet Take 1 tablet (20 mg total) by mouth daily with supper. 90 tablet 3   torsemide (DEMADEX) 10 MG tablet Take 1 tablet (10 mg total) by mouth daily. 30 tablet 4   valACYclovir (VALTREX) 500 MG tablet Take 500 mg by mouth daily.     ZIRGAN 0.15 % GEL Place 1 drop into the left eye 5 (five) times daily.     ertapenem 1,000 mg in sodium chloride 0.9 % 100 mL Inject 1,000 mg into the vein daily. 1 g 32   No current facility-administered medications for this visit.    Allergies  Allergen Reactions   Levaquin [Levofloxacin]     Aortic Aneurysm    Social History   Socioeconomic History   Marital status: Single    Spouse name: Not on file   Number of children: Not on file   Years of education: Not on file   Highest education level: Not on file  Occupational History   Not on file  Tobacco Use   Smoking status: Former    Packs/day: 2.00    Years: 36.00    Pack years: 72.00    Types: Cigarettes    Quit date: 01/01/2020    Years since quitting: 1.3   Smokeless tobacco: Never  Substance and Sexual Activity   Alcohol use: Yes    Alcohol/week: 12.0 standard drinks    Types: 12 Standard drinks or equivalent per week    Comment: 2 per day   Drug use: No   Sexual activity: Not on file  Other Topics Concern   Not on file  Social History Narrative   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     Married    No kids   Social Determinants of Radio broadcast assistant Strain: Not on file  Food Insecurity: Not on file  Transportation Needs: Not on file  Physical Activity: Not on file  Stress: Not on file  Social Connections: Not on file  Intimate Partner Violence: Not on file     Review of Systems: All other systems reviewed and are otherwise negative except as noted  above.  Physical Exam: Vitals:   05/07/21 0853  BP: 124/74  Pulse: 84  SpO2: 98%  Weight: (!) 309 lb (140.2 kg)  Height: 6' (1.829 m)    GEN- The patient is well appearing, alert and oriented x 3 today.   HEENT: normocephalic, atraumatic; sclera clear, conjunctiva pink; hearing intact; oropharynx clear; neck supple, no JVP Lymph- no cervical lymphadenopathy Lungs- Clear to ausculation bilaterally, normal work of breathing.  No wheezes, rales, rhonchi Heart- Regular rate and rhythm, no murmurs, rubs or gallops, PMI not laterally displaced GI- soft, non-tender, non-distended, bowel sounds present, no hepatosplenomegaly Extremities- no clubbing, cyanosis, or edema; DP/PT/radial pulses 2+ bilaterally MS- no significant deformity or atrophy Skin- warm and dry, no rash or lesion Psych- euthymic mood, full affect Neuro- strength and sensation are intact  EKG is not ordered. Personal review of EKG from  04/16/2021  shows AF with controlled VR and a PVC  Additional studies reviewed include: Previous EP office notes.   Assessment and Plan:  Atrial flutter/fibrillation  - s/p DCCV 10/21 - s/p atrial flutter ablation 11/21 w/ Dr. Caryl Comes - Cllipping of left atrial appendage with Pro-V clip at time of CABG. - Recurrent post-op AFL.  - Continue amiodarone 200 mg daily for now. - Continue Toprol XL 25 mg daily. - Labs today.  - He has repeat Echo 05/2021.  On Xarelto  - Will review EKG. Appears to be atypical flutter, less likely to benefit from ablation, especially given his BMI.  - Offered [facility] and pt  refuses at this time. Wants to get Echo and see MD back first.    2. Cardiomyopathy  Echo 09/2020 LVEF 45-50% post CABG Continue Toprol and Entresto.    3 Morbid obesity--s/p Lap Band Body mass index is 41.91 kg/m.    4. HTN Stable on current regimen    5. DVT- chronic anticoagulation on Rivaroxaban  Stable    6. Sleep disordered breathing  Encouraged nightly CPAP  Follow up with Dr. Caryl Comes in  6-8 weeks    Shirley Friar, PA-C  05/07/21 9:10 AM

## 2021-05-07 ENCOUNTER — Other Ambulatory Visit: Payer: BC Managed Care – PPO | Admitting: *Deleted

## 2021-05-07 ENCOUNTER — Other Ambulatory Visit: Payer: Self-pay

## 2021-05-07 ENCOUNTER — Ambulatory Visit (INDEPENDENT_AMBULATORY_CARE_PROVIDER_SITE_OTHER): Payer: BC Managed Care – PPO | Admitting: Student

## 2021-05-07 ENCOUNTER — Encounter: Payer: Self-pay | Admitting: Student

## 2021-05-07 VITALS — BP 124/74 | HR 84 | Ht 72.0 in | Wt 309.0 lb

## 2021-05-07 DIAGNOSIS — I2510 Atherosclerotic heart disease of native coronary artery without angina pectoris: Secondary | ICD-10-CM | POA: Diagnosis not present

## 2021-05-07 DIAGNOSIS — I825Z2 Chronic embolism and thrombosis of unspecified deep veins of left distal lower extremity: Secondary | ICD-10-CM

## 2021-05-07 DIAGNOSIS — I5020 Unspecified systolic (congestive) heart failure: Secondary | ICD-10-CM

## 2021-05-07 DIAGNOSIS — I4891 Unspecified atrial fibrillation: Secondary | ICD-10-CM | POA: Diagnosis not present

## 2021-05-07 DIAGNOSIS — G4733 Obstructive sleep apnea (adult) (pediatric): Secondary | ICD-10-CM

## 2021-05-07 DIAGNOSIS — I10 Essential (primary) hypertension: Secondary | ICD-10-CM

## 2021-05-07 DIAGNOSIS — I483 Typical atrial flutter: Secondary | ICD-10-CM | POA: Diagnosis not present

## 2021-05-07 LAB — BASIC METABOLIC PANEL
BUN/Creatinine Ratio: 15 (ref 10–24)
BUN: 27 mg/dL (ref 8–27)
CO2: 24 mmol/L (ref 20–29)
Calcium: 9.5 mg/dL (ref 8.6–10.2)
Chloride: 102 mmol/L (ref 96–106)
Creatinine, Ser: 1.78 mg/dL — ABNORMAL HIGH (ref 0.76–1.27)
Glucose: 95 mg/dL (ref 70–99)
Potassium: 4.6 mmol/L (ref 3.5–5.2)
Sodium: 138 mmol/L (ref 134–144)
eGFR: 43 mL/min/{1.73_m2} — ABNORMAL LOW (ref >59)

## 2021-05-07 NOTE — Patient Instructions (Signed)
Medication Instructions:  Your physician recommends that you continue on your current medications as directed. Please refer to the Current Medication list given to you today.  *If you need a refill on your cardiac medications before your next appointment, please call your pharmacy*   Lab Work: TODAY: BMET If you have labs (blood work) drawn today and your tests are completely normal, you will receive your results only by: [place] (if you have MyChart) OR A paper copy in the mail If you have any lab test that is abnormal or we need to change your treatment, we will call you to review the results.   Follow-Up: At [facility] [place], you and your health needs are our priority.  As part of our continuing mission to provide you with exceptional heart care, we have created designated Provider Care Teams.  These Care Teams include your primary Cardiologist (physician) and Advanced Practice Providers (APPs -  Physician Assistants and Nurse Practitioners) who all work together to provide you with the care you need, when you need it.   Your next appointment:   With Dr Caryl Comes after Echocardiogram on 06/11/21

## 2021-05-29 ENCOUNTER — Telehealth (HOSPITAL_COMMUNITY): Payer: Self-pay | Admitting: Internal Medicine

## 2021-05-29 NOTE — Telephone Encounter (Signed)
Patient called and cancelled echocardiogram due to he is in the hospital in [place] and had an episode of syncope.  They did an echo while he is in the hospital and he will have sent to the [facility] office. Order will be removed from the active ECHO WQ.

## 2021-05-30 ENCOUNTER — Telehealth: Payer: Self-pay | Admitting: Adult Health

## 2021-05-30 NOTE — Telephone Encounter (Signed)
Left message to return phone call.

## 2021-05-30 NOTE — Telephone Encounter (Signed)
Matthew Savage from [place]  call and stated pt was in the hospital  but is out and staying with his brother in [place] and is following up with ENT dr .also stated he will be doing Physical Therapy for his vertigo .Eulis Foster 's # is [phone] ext [phone].

## 2021-06-02 NOTE — Telephone Encounter (Signed)
Spoke to [place] and she stated that pt was released from the hospital. She articulated that she has some concerns with pt. [name] claimed that pt c/o of dizziness so has not been able to drive. Matthew Savage is also worried about pt Atrial fibulation issue and wanting our office to call pt to schedule a f/u just in case pt does not follow through with it.   I spoke with pt and pt stated that he was stuck in [place] and not able to drive due to the dizziness. Pt stated that he has an appt. Here in [facility] March 16. I advised pt that I could set him up for an appt. The same day. Pt wanted me to scheduled him on 3/17 instead due to multiple appt. Pt stated that he sees the ENT for the dizziness 06/04/2021 and Cardio 2/16. Pt wasntst to see [facility] after these appt.    Pt has been scheduled no further action needed!

## 2021-06-03 ENCOUNTER — Encounter: Payer: Self-pay | Admitting: Adult Health

## 2021-06-03 ENCOUNTER — Telehealth: Payer: Self-pay | Admitting: Adult Health

## 2021-06-03 NOTE — Telephone Encounter (Signed)
FYI pt has an appt 3/17

## 2021-06-03 NOTE — Telephone Encounter (Signed)
Pt will have FMLA paperwork fax to [employer] and he will sent mychart message to [employer]. Pt has several vertigo and he will be going to physical therapy and will need FMLA for one month

## 2021-06-04 ENCOUNTER — Encounter: Payer: Self-pay | Admitting: Adult Health

## 2021-06-04 NOTE — Telephone Encounter (Signed)
FYI

## 2021-06-04 NOTE — Telephone Encounter (Signed)
FMLA paperwork has been place in [place]

## 2021-06-04 NOTE — Telephone Encounter (Signed)
Noted  

## 2021-06-05 ENCOUNTER — Other Ambulatory Visit (HOSPITAL_COMMUNITY): Payer: BC Managed Care – PPO

## 2021-06-05 ENCOUNTER — Encounter (HOSPITAL_COMMUNITY): Payer: Self-pay

## 2021-06-05 NOTE — Telephone Encounter (Signed)
FYI

## 2021-06-06 DIAGNOSIS — Z0279 Encounter for issue of other medical certificate: Secondary | ICD-10-CM

## 2021-06-07 ENCOUNTER — Other Ambulatory Visit: Payer: Self-pay | Admitting: Adult Health

## 2021-06-07 DIAGNOSIS — M109 Gout, unspecified: Secondary | ICD-10-CM

## 2021-06-11 ENCOUNTER — Other Ambulatory Visit: Payer: Self-pay | Admitting: Internal Medicine

## 2021-06-11 ENCOUNTER — Other Ambulatory Visit (HOSPITAL_COMMUNITY): Payer: BC Managed Care – PPO

## 2021-06-11 DIAGNOSIS — I4891 Unspecified atrial fibrillation: Secondary | ICD-10-CM

## 2021-06-11 DIAGNOSIS — I10 Essential (primary) hypertension: Secondary | ICD-10-CM

## 2021-06-12 ENCOUNTER — Other Ambulatory Visit: Payer: Self-pay | Admitting: Adult Health

## 2021-06-12 DIAGNOSIS — I825Z2 Chronic embolism and thrombosis of unspecified deep veins of left distal lower extremity: Secondary | ICD-10-CM

## 2021-06-17 ENCOUNTER — Encounter: Payer: Self-pay | Admitting: Adult Health

## 2021-06-17 DIAGNOSIS — I825Z2 Chronic embolism and thrombosis of unspecified deep veins of left distal lower extremity: Secondary | ICD-10-CM

## 2021-06-18 ENCOUNTER — Encounter: Payer: Self-pay | Admitting: Adult Health

## 2021-06-18 ENCOUNTER — Telehealth (INDEPENDENT_AMBULATORY_CARE_PROVIDER_SITE_OTHER): Payer: BC Managed Care – PPO | Admitting: Adult Health

## 2021-06-18 VITALS — Ht 72.0 in | Wt 306.0 lb

## 2021-06-18 DIAGNOSIS — H8113 Benign paroxysmal vertigo, bilateral: Secondary | ICD-10-CM | POA: Diagnosis not present

## 2021-06-18 DIAGNOSIS — H9192 Unspecified hearing loss, left ear: Secondary | ICD-10-CM

## 2021-06-18 DIAGNOSIS — I483 Typical atrial flutter: Secondary | ICD-10-CM | POA: Diagnosis not present

## 2021-06-18 MED ORDER — RIVAROXABAN 20 MG PO TABS: 20.0000 mg | ORAL_TABLET | Freq: Every day | ORAL | 0 refills | Status: DC

## 2021-06-18 NOTE — Telephone Encounter (Signed)
Rx sent to pharmacy pt aware of call and refill.  ?

## 2021-06-18 NOTE — Progress Notes (Signed)
Virtual Visit via Video Note ? ?I connected with Matthew Savage. on 06/18/21 at 11:30 AM EST by a video enabled telemedicine application and verified that I am speaking with the correct person using two identifiers. ? Location patient: home ?Location provider:work or home office ?Persons participating in the virtual visit: patient, provider ? ?I discussed the limitations of evaluation and management by telemedicine and the availability of in person appointments. The patient expressed understanding and agreed to proceed. ? ? ?HPI: ?61 year old male who was admitted to an outside hospital in [place] on 05/28/2021 for BPPV and atrial flutter.  He continues to be in [place] recovering from this hospital admission.  He has been seen by ear nose and throat in [place] as well as gone through physical therapy.  He denies having any recurring dizziness but does have some balance issues and loss of hearing in his left ear.  Has a upcoming follow-up with ENT while in [place] to try and find out why he has had loss of hearing since the episode of BPPV. ? ?He will then return to [place] and has an appointment with his cardiologist on March 16 and then a follow-up appointment with myself on March 17. ? 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 ? ? ?ROS: See pertinent positives and negatives per HPI. ? ?Past Medical History:  ?Diagnosis Date  ? Blood in urine   ? Class 2 obesity 09/30/2020  ? DDD (degenerative disc disease)   ? DVT (deep venous thrombosis) ([place]) 2017 & 2018  ? Gout   ? Heart murmur   ? Hyperlipidemia   ? Hypertension   ? Numbness and tingling   ? right arm and hand  ? SCC (squamous cell carcinoma)   ? skin, squamous cell, face  ? ? ?Past Surgical History:  ?Procedure Laterality Date  ? A-FLUTTER ABLATION N/A 03/11/2020  ? Procedure: A-FLUTTER ABLATION;  Surgeon: Deboraha Sprang, MD;  Location: [facility];  Service: Cardiovascular;  Laterality: N/A;  ? APPLICATION OF WOUND  VAC  10/18/2020  ? Procedure: APPLICATION OF WOUND VAC;  Surgeon: Lajuana Matte, MD;  Location: [facility];  Service: Thoracic;;  ? ASD REPAIR N/A 10/03/2020  ? Procedure: ATRIAL SEPTAL DEFECT (ASD) REPAIR WITH PERI GUARD PERICARDIUM;  Surgeon: Wonda Olds, MD;  Location: [facility];  Service: Open Heart Surgery;  Laterality: N/A;  ? CLIPPING OF ATRIAL APPENDAGE Left 10/03/2020  ? Procedure: CLIPPING OF ATRIAL APPENDAGE WITH ATRICURE 58 CLIP;  Surgeon: Wonda Olds, MD;  [facility];  Service: Open Heart Surgery;  Laterality: Left;  ? CORONARY ARTERY BYPASS GRAFT N/A 10/03/2020  ? Procedure: CORONARY ARTERY BYPASS GRAFTING (CABG) X FOUR, USING BILATERAL MAMMARY ARTERIES AND RIGHT ENDOSCOPIC GREATER SAPHENOUS VEIN CONDUITS;  Surgeon: Wonda Olds, MD;  Location: [facility];  Service: Open Heart Surgery;  Laterality: N/A;  ? [place]  ? X 2  ? IR THORACENTESIS ASP PLEURAL SPACE W/IMG GUIDE  10/28/2020  ? LAPAROSCOPIC GASTRIC BANDING  03/2009  ? in [place], [place]  ? LEFT HEART CATH AND CORONARY ANGIOGRAPHY N/A 10/01/2020  ? Procedure: LEFT HEART CATH AND CORONARY ANGIOGRAPHY;  Surgeon: Troy Sine, MD;  Location: [facility];  Service: Cardiovascular;  Laterality: N/A;  ? MASS EXCISION  12/03/2011  ? X 2 (gluteal) X 1 (lower back)  ? MOHS SURGERY    ? x 2  ? STERNAL WOUND DEBRIDEMENT N/A 10/18/2020  ?  Procedure: STERNAL WOUND DEBRIDEMENT;  Surgeon: Lajuana Matte, MD;  Location: [facility];  Service: Thoracic;  Laterality: N/A;  ? STERNAL WOUND DEBRIDEMENT N/A 10/23/2020  ? Procedure: WOUND VAC CHANGE with APPLICATION OF MYRIAD MORCELLS;  Surgeon: Wonda Olds, MD;  Location: [facility];  Service: Thoracic;  Laterality: N/A;  ? TEE WITHOUT CARDIOVERSION N/A 10/03/2020  ? Procedure: TRANSESOPHAGEAL ECHOCARDIOGRAM (TEE);  Surgeon: Wonda Olds, MD;  Location: [facility];  Service: Open Heart Surgery;  Laterality: N/A;  ? ? ?Family History  ?Problem Relation Age of Onset  ? Breast cancer Mother   ?  Other Mother   ?     small cell carcinoma  ? Hypertension Father   ? Colon polyps Father   ? Colon polyps Brother   ? ? ? ? ? ?Current Outpatient Medications:  ?  acetaminophen (TYLENOL) 325 MG tablet, Take 2 tablets (650 mg total) by mouth every 6 (six) hours as needed for fever., Disp: , Rfl:  ?  allopurinol (ZYLOPRIM) 300 MG tablet, TAKE 1 TABLET BY MOUTH EVERY DAY, Disp: 90 tablet, Rfl: 0 ?  amiodarone (PACERONE) 200 MG tablet, TAKE 1 TABLET BY MOUTH EVERY DAY, Disp: 90 tablet, Rfl: 1 ?  aspirin EC 81 MG EC tablet, Take 1 tablet (81 mg total) by mouth daily. Swallow whole., Disp: 30 tablet, Rfl: 11 ?  atorvastatin (LIPITOR) 80 MG tablet, TAKE 1 TABLET BY MOUTH EVERY DAY, Disp: 90 tablet, Rfl: 1 ?  cetirizine (ZYRTEC) 10 MG tablet, Take by mouth., Disp: , Rfl:  ?  colchicine 0.6 MG tablet, Take 0.6 mg by mouth daily as needed (Flair up gout). , Disp: , Rfl:  ?  dapagliflozin propanediol (FARXIGA) 10 MG TABS tablet, Take 1 tablet (10 mg total) by mouth daily before breakfast., Disp: 90 tablet, Rfl: 1 ?  ferrous [id number] C-folic acid (TRINSICON / FOLTRIN) capsule, Take 1 capsule by mouth daily., Disp: , Rfl:  ?  gentamicin-prednisoLONE 0.3-1 % ophthalmic drops, 1 drop 1 (one) time each day., Disp: , Rfl:  ?  metoprolol succinate (TOPROL-XL) 25 MG 24 hr tablet, TAKE 1 TABLET BY MOUTH DAILY. TAKE WITH OR IMMEDIATELY FOLLOWING A MEAL., Disp: 90 tablet, Rfl: 1 ?  moxifloxacin (VIGAMOX) 0.5 % ophthalmic solution, Place 1 drop into the left eye 4 (four) times daily., Disp: , Rfl:  ?  oxyCODONE (OXY IR/ROXICODONE) 5 MG immediate release tablet, Take 1 tablet (5 mg total) by mouth every 6 (six) hours as needed for severe pain., Disp: 24 tablet, Rfl: 0 ?  rivaroxaban (XARELTO) 20 MG TABS tablet, Take 1 tablet (20 mg total) by mouth daily with supper., Disp: 30 tablet, Rfl: 0 ?  torsemide (DEMADEX) 10 MG tablet, Take 1 tablet (10 mg total) by mouth daily., Disp: 30 tablet, Rfl: 4 ?  valACYclovir (VALTREX) 500 MG  tablet, Take 500 mg by mouth daily., Disp: , Rfl:  ?  ZIRGAN 0.15 % GEL, Place 1 drop into the left eye 5 (five) times daily., Disp: , Rfl:  ? ?EXAM: ? ?VITALS per patient if applicable: ? ?GENERAL: alert, oriented, appears well and in no acute distress ? ?HEENT: atraumatic, conjunttiva clear, no obvious abnormalities on inspection of external nose and ears ? ?NECK: normal movements of the head and neck ? ?LUNGS: on inspection no signs of respiratory distress, breathing rate appears normal, no obvious gross SOB, gasping or wheezing ? ?CV: no obvious cyanosis ? ?MS: moves all visible extremities without noticeable abnormality ? ?PSYCH/NEURO: pleasant and cooperative,  no obvious depression or anxiety, speech and thought processing grossly intact ? ?ASSESSMENT AND PLAN: ? ?Discussed the following assessment and plan: ? ?1. Benign paroxysmal positional vertigo due to bilateral vestibular disorder ?-I am glad he is feeling better and the dizziness has resolved.  I recommend that he not return to work until he has been evaluated by cardiology and myself in person.  As of right now I would keep him out of work until March 20 earliest return.  Once he is cleared by cardiology and myself he may return to work.  Advised to follow-up with ENT while in [place] to look at causes of hearing loss in his left ear as this may also be tripping factor on when he can officially return to work safely. ? ?2. Typical atrial flutter ([place]) ? ? ?3. Hearing loss of left ear, unspecified hearing loss type ? ? ?I discussed the assessment and treatment plan with the patient. The patient was provided an opportunity to ask questions and all were answered. The patient agreed with the plan and demonstrated an understanding of the instructions. ?  ?The patient was advised to call back or seek an in-person evaluation if the symptoms worsen or if the condition fails to improve as anticipated. ? ? ?Dorothyann Peng, NP  ? ?

## 2021-06-19 ENCOUNTER — Encounter: Payer: Self-pay | Admitting: Adult Health

## 2021-06-19 DIAGNOSIS — H9192 Unspecified hearing loss, left ear: Secondary | ICD-10-CM

## 2021-06-19 NOTE — Telephone Encounter (Signed)
Pt called back and was advised of message below with explanation that the PPW was already sent off to job. However, the original form has been sent to scans department after holding PPW for 2 weeks. Pt verbalized understanding and would like for me to send off once it is scanned into chart.  ?

## 2021-06-19 NOTE — Telephone Encounter (Signed)
Pt is calling checking on the FMLA paperwork and would like a callback ?

## 2021-06-25 NOTE — Telephone Encounter (Signed)
FYI

## 2021-06-27 ENCOUNTER — Telehealth: Payer: Self-pay | Admitting: Adult Health

## 2021-06-27 NOTE — Telephone Encounter (Signed)
Noted  

## 2021-06-27 NOTE — Telephone Encounter (Signed)
Patient came in to bring in paperwork that he needs Tommi Rumps to complete. Paperwork would be placed in the folder. ? ?Paperwork could be faxed to [phone]. ? ?Patient would also like to be contacted at [phone] to pick up paperwork when complete. ? ?Please advise. ?

## 2021-06-30 NOTE — Telephone Encounter (Signed)
Pt is aware paperwork can take 3-5 business days ?

## 2021-06-30 NOTE — Progress Notes (Unsigned)
Cardiology Office Note:    Date:  06/30/2021   ID:  Matthew Savage, DOB 1960-11-04, MRN [id number]  PCP:  Dorothyann Peng, NP  [facility] Cardiologist:  Werner Lean, MD  [facility] Electrophysiologist:  Virl Axe, MD   Referring MD: Dorothyann Peng, NP   HE:NIDPOE up AF  History of Present Illness:    Matthew Savage is a 61 y.o. male with a hx of morbid obesity s/p Lap Band atrial fibrillation (paroxsymal) atrial flutter (first diagnoses in [place]), essential hypertension, mild aortic stenosis, hyperlipidemia, and tobacco use who established at [facility] 12/28/19.  Had AF and was s/p ablation.  In follow up had NSTEMI 09/2020.  Found to have multi-vessel CAD, ASD repair, and LAA clip.  Complicated by ileus and post operative flutter. And HFrEF.  Then sternal wound and bacteremia.  Then AKI.  Returned to [place] to rehab with family.  Complete CR.  Started back to work.  Seen me 3/14 and Dr. Caryl Comes 3/16, then PCP 3/17.  Patient notes that he is doing ***.   Since day prior/last visit notes *** . There are no*** interval hospital/ED visit.    No chest pain or pressure ***.  No SOB/DOE*** and no PND/Orthopnea***.  No weight gain or leg swelling***.  No palpitations or syncope ***.  Ambulatory blood pressure ***.    Past Medical History:  Diagnosis Date   Blood in urine    Class 2 obesity 09/30/2020   DDD (degenerative disc disease)    DVT (deep venous thrombosis) ([place]) 2017 & 2018   Gout    Heart murmur    Hyperlipidemia    Hypertension    Numbness and tingling    right arm and hand   SCC (squamous cell carcinoma)    skin, squamous cell, face    Past Surgical History:  Procedure Laterality Date   A-FLUTTER ABLATION N/A 03/11/2020   Procedure: A-FLUTTER ABLATION;  Surgeon: Deboraha Sprang, MD;  Location: [facility];  Service: Cardiovascular;  Laterality: N/A;   APPLICATION OF WOUND VAC  10/18/2020   Procedure: APPLICATION OF WOUND VAC;   Surgeon: Lajuana Matte, MD;  Location: [facility];  Service: Thoracic;;   ASD REPAIR N/A 10/03/2020   Procedure: ATRIAL SEPTAL DEFECT (ASD) REPAIR WITH PERI GUARD PERICARDIUM;  Surgeon: Wonda Olds, MD;  Location: [facility];  Service: Open Heart Surgery;  Laterality: N/A;   CLIPPING OF ATRIAL APPENDAGE Left 10/03/2020   Procedure: CLIPPING OF ATRIAL APPENDAGE WITH ATRICURE 28 CLIP;  Surgeon: Wonda Olds, MD;  Location: [facility];  Service: Open Heart Surgery;  Laterality: Left;   CORONARY ARTERY BYPASS GRAFT N/A 10/03/2020   Procedure: CORONARY ARTERY BYPASS GRAFTING (CABG) X FOUR, USING BILATERAL MAMMARY ARTERIES AND RIGHT ENDOSCOPIC GREATER SAPHENOUS VEIN CONDUITS;  Surgeon: Wonda Olds, MD;  Location: [facility];  Service: Open Heart Surgery;  Laterality: N/A;   HERNIA REPAIR  1967   X 2   IR THORACENTESIS ASP PLEURAL SPACE W/IMG GUIDE  10/28/2020   LAPAROSCOPIC GASTRIC BANDING  03/2009   in [place], [place] CATH AND CORONARY ANGIOGRAPHY N/A 10/01/2020   Procedure: LEFT HEART CATH AND CORONARY ANGIOGRAPHY;  Surgeon: Troy Sine, MD;  Location: [place] [facility];  Service: Cardiovascular;  Laterality: N/A;   MASS EXCISION  12/03/2011   X 2 (gluteal) X 1 (lower back)   MOHS SURGERY     x 2   STERNAL WOUND  DEBRIDEMENT N/A 10/18/2020   Procedure: STERNAL WOUND DEBRIDEMENT;  Surgeon: Lajuana Matte, MD;  Location: [facility];  Service: Thoracic;  Laterality: N/A;   STERNAL WOUND DEBRIDEMENT N/A 10/23/2020   Procedure: WOUND VAC CHANGE with APPLICATION OF MYRIAD MORCELLS;  Surgeon: Wonda Olds, MD;  Location: [facility] [place];  Service: Thoracic;  Laterality: N/A;   TEE WITHOUT CARDIOVERSION N/A 10/03/2020   Procedure: TRANSESOPHAGEAL ECHOCARDIOGRAM (TEE);  Surgeon: Wonda Olds, MD;  Location: [place];  Service: Open Heart Surgery;  Laterality: N/A;   Current Medications: No outpatient medications have been marked as taking for the 07/01/21 encounter (Appointment) with Werner Lean, MD.     Allergies:   Levaquin [levofloxacin]   Social History   Socioeconomic History   Marital status: Single    Spouse name: Not on file   Number of children: Not on file   Years of education: Not on file   Highest education level: Associate degree: occupational, Hotel manager, or vocational program  Occupational History   Not on file  Tobacco Use   Smoking status: Former    Packs/day: 2.00    Years: 36.00    Pack years: 72.00    Types: Cigarettes    Quit date: 01/01/2020    Years since quitting: 1.4   Smokeless tobacco: Never  Substance and Sexual Activity   Alcohol use: Yes    Alcohol/week: 12.0 standard drinks    Types: 12 Standard drinks or equivalent per week    Comment: 2 per day   Drug use: No   Sexual activity: Not on file  Other Topics Concern   Not on file  Social History Narrative   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    Married    No kids   Social Determinants of Radio broadcast assistant Strain: Low Risk    Difficulty of Paying Living Expenses: Not hard at all  Food Insecurity: No Food Insecurity   Worried About Charity fundraiser in the Last Year: Never true   Arboriculturist in the Last Year: Never true  Transportation Needs: No Transportation Needs   Lack of Transportation (Medical): No   Lack of Transportation (Non-Medical): No  Physical Activity: Sufficiently Active   Days of Exercise per Week: 3 days   Minutes of Exercise per Session: 60 min  Stress: No Stress Concern Present   Feeling of Stress : Not at all  Social Connections: Socially Isolated   Frequency of Communication with Friends and Family: Three times a week   Frequency of Social Gatherings with Friends and Family: Twice a week   Attends Religious Services: Never   Marine scientist or Organizations: No   Attends Music therapist: Not on file   Marital Status: Divorced    Family History: The patient's family history includes Breast cancer in  his mother; Colon polyps in his brother and father; Hypertension in his father; Other in his mother. Cousin has PE and died.  No hx of Bicuspid Valve in Family.  No hx of SCD.   ROS:   Please see the history of present illness.    All other systems reviewed and are negative.  EKGs/Labs/Other Studies Reviewed:    The following studies were reviewed today:  EKG:   02/29/20 atrial flutter with ventricular rate of 94 and Ashman beats vs occasional PVCs 12/22/19 EKG notable for atrial flutter rate 96  Transthoracic Echocardiogram: Date:07/08/20 Results: 1. Left ventricular ejection  fraction, by estimation, is 50 to 55%. The  left ventricle has low normal function. The left ventricle demonstrates  regional wall motion abnormalities (see scoring diagram/findings for  description). There is moderate  concentric left ventricular hypertrophy. Left ventricular diastolic  parameters are consistent with Grade II diastolic dysfunction  (pseudonormalization). Elevated left ventricular end-diastolic pressure.  There is hypokinesis of the left ventricular,  entire anterior wall and lateral wall.   2. Right ventricular systolic function is normal. The right ventricular  size is normal. Tricuspid regurgitation signal is inadequate for assessing  PA pressure.   3. Left atrial size was mild to moderately dilated.   4. Right atrial size was mildly dilated.   5. The mitral valve is normal in structure. Trivial mitral valve  regurgitation. No evidence of mitral stenosis.   6. The aortic valve is tricuspid. There is moderate calcification of the  aortic valve. There is moderate thickening of the aortic valve. Aortic  valve regurgitation is not visualized. Mild aortic valve stenosis.   7. Aortic dilatation noted. There is mild dilatation of the ascending  aorta, measuring 41 mm.   8. The inferior vena cava is normal in size with greater than 50%  respiratory variability, suggesting right atrial pressure  of 3 mmHg.   Recent Labs: 10/15/2020: B Natriuretic Peptide 226.8 10/18/2020: Magnesium 1.4 04/16/2021: ALT 37; Hemoglobin 12.3; Platelets 264; TSH 1.740 05/07/2021: BUN 27; Creatinine, Ser 1.78; Potassium 4.6; Sodium 138  Recent Lipid Panel    Component Value Date/Time   CHOL 114 07/08/2020 0847   TRIG 74 07/08/2020 0847   HDL 50 07/08/2020 0847   CHOLHDL 2.3 07/08/2020 0847   CHOLHDL 4 06/21/2019 0729   VLDL 17.0 06/21/2019 0729   LDLCALC 49 07/08/2020 0847   LDLDIRECT 135.1 04/06/2011 0835    Physical Exam:    VS:  There were no vitals taken for this visit.    Wt Readings from Last 3 Encounters:  06/18/21 (!) 306 lb (138.8 kg)  05/07/21 (!) 309 lb (140.2 kg)  04/16/21 (!) 304 lb 3.2 oz (138 kg)    Gen: *** distress, *** obese/well nourished/malnourished   Neck: No JVD, *** carotid bruit Ears: *** Frank Sign Cardiac: No Rubs or Gallops, *** Murmur, ***cardia, *** radial pulses Respiratory: Clear to auscultation bilaterally, *** effort, ***  respiratory rate GI: Soft, nontender, non-distended *** MS: No *** edema; *** moves all extremities Integument: Skin feels *** Neuro:  At time of evaluation, alert and oriented to person/place/time/situation *** Psych: Normal affect, patient feels ***   ASSESSMENT:    No diagnosis found.  PLAN:     CAD s/p CABG Asymptomatic thoracic aortic aneurysm HLD - Last at 4.1, stable growth rate - 1st degree relative one time screening discussed at prior visits - Discussed not using Fluoroquinolones at prior visits - Continue statin: check Lipids and LFTs today - will get echo in 11 months to follow aneurysm  Heart Failure mildly reduced Ejection Fraction  Morbid Obesity HTN - NYHA class I, Stage B, euvolemic, etiology from AF - Diuretic regimen: none - Discussed the importance of fluid restriction of < 2 L, salt restriction, and checking daily weights  - metoprolol succinate 50 mg - Entresto 49-51   Mild AS - Last Aortic  Gradients  17 mm Hg in 2022;  Atrial Flutter, Atrial Fibrillation  - CHADSVASC 2 (HTN/DM) Hx of DVT on Xarelto - Asymptomatic s/p ablation - s/p DCCV - continue AC for DVT and A fib;  continue  BB  OSA - on CPAP and sleeping btter  Former Tobacco Use - 6 months free    Medication Adjustments/Labs and Tests Ordered: Current medicines are reviewed at length with the patient today.  Concerns regarding medicines are outlined above.  No orders of the defined types were placed in this encounter.  No orders of the defined types were placed in this encounter.   There are no Patient Instructions on file for this visit.   Signed, Werner Lean, MD  06/30/2021 5:27 PM    [place]

## 2021-07-01 ENCOUNTER — Encounter: Payer: Self-pay | Admitting: Internal Medicine

## 2021-07-01 ENCOUNTER — Ambulatory Visit (INDEPENDENT_AMBULATORY_CARE_PROVIDER_SITE_OTHER): Payer: BC Managed Care – PPO | Admitting: Internal Medicine

## 2021-07-01 ENCOUNTER — Other Ambulatory Visit: Payer: Self-pay

## 2021-07-01 VITALS — BP 120/76 | HR 70 | Ht 72.0 in | Wt 314.0 lb

## 2021-07-01 DIAGNOSIS — I483 Typical atrial flutter: Secondary | ICD-10-CM | POA: Diagnosis not present

## 2021-07-01 DIAGNOSIS — Z5181 Encounter for therapeutic drug level monitoring: Secondary | ICD-10-CM | POA: Diagnosis not present

## 2021-07-01 DIAGNOSIS — I25118 Atherosclerotic heart disease of native coronary artery with other forms of angina pectoris: Secondary | ICD-10-CM

## 2021-07-01 DIAGNOSIS — I5020 Unspecified systolic (congestive) heart failure: Secondary | ICD-10-CM | POA: Diagnosis not present

## 2021-07-01 NOTE — Telephone Encounter (Signed)
Forms completed

## 2021-07-01 NOTE — Patient Instructions (Signed)
Medication Instructions:  ?Your physician has recommended you make the following change in your medication:  ?STOP: aspirin  ?*If you need a refill on your cardiac medications before your next appointment, please call your pharmacy* ? ? ?Lab Work: ?TODAY: CMP, TSH, BNP ? ?If you have labs (blood work) drawn today and your tests are completely normal, you will receive your results only by: ?MyChart Message (if you have MyChart) OR ?A paper copy in the mail ?If you have any lab test that is abnormal or we need to change your treatment, we will call you to review the results. ? ? ?Testing/Procedures: ?Your physician has recommended that you have a pulmonary function test. Pulmonary Function Tests are a group of tests that measure how well air moves in and out of your lungs. ?Patient Instructions for Pulmonary Function Test ? ?Do not smoke within at least 1 hour before the test. ?Do not consume Caffeine 4 hours prior to the test. ?Do not consume Alcohol within 4 hours prior to testing. ?Do not perform any Vigorous Exercise within 30 minutes before the test. ?Do not wear clothes that restrict the chest area or abdomen. ?Do not use albuterol or Xopenex 3 hours before the test or any other nebulizer medications or inhalers.  ? ? ?Follow-Up: ?At [facility], you and your health needs are our priority.  As part of our continuing mission to provide you with exceptional heart care, we have created designated Provider Care Teams.  These Care Teams include your primary Cardiologist (physician) and Advanced Practice Providers (APPs -  Physician Assistants and Nurse Practitioners) who all work together to provide you with the care you need, when you need it. ? ? ?Your next appointment:   ?3 month(s) ? ?The format for your next appointment:   ?In Person ? ?Provider:   ?Werner Lean, MD   ? ?

## 2021-07-02 ENCOUNTER — Other Ambulatory Visit (HOSPITAL_COMMUNITY): Payer: Self-pay | Admitting: Family Medicine

## 2021-07-02 LAB — COMPREHENSIVE METABOLIC PANEL
ALT: 30 IU/L (ref 0–44)
AST: 19 IU/L (ref 0–40)
Albumin/Globulin Ratio: 1.6 (ref 1.2–2.2)
Albumin: 4.1 g/dL (ref 3.8–4.9)
Alkaline Phosphatase: 105 IU/L (ref 44–121)
BUN/Creatinine Ratio: 13 (ref 10–24)
BUN: 23 mg/dL (ref 8–27)
Bilirubin Total: 0.3 mg/dL (ref 0.0–1.2)
CO2: 22 mmol/L (ref 20–29)
Calcium: 9.3 mg/dL (ref 8.6–10.2)
Chloride: 101 mmol/L (ref 96–106)
Creatinine, Ser: 1.77 mg/dL — ABNORMAL HIGH (ref 0.76–1.27)
Globulin, Total: 2.6 g/dL (ref 1.5–4.5)
Glucose: 93 mg/dL (ref 70–99)
Potassium: 4.7 mmol/L (ref 3.5–5.2)
Sodium: 139 mmol/L (ref 134–144)
Total Protein: 6.7 g/dL (ref 6.0–8.5)
eGFR: 43 mL/min/{1.73_m2} — ABNORMAL LOW (ref >59)

## 2021-07-02 LAB — PRO B NATRIURETIC PEPTIDE: NT-Pro BNP: 1036 pg/mL — ABNORMAL HIGH (ref 0–210)

## 2021-07-02 LAB — TSH: TSH: 1.56 u[IU]/mL (ref 0.450–4.500)

## 2021-07-02 NOTE — Telephone Encounter (Signed)
Spoke to pt and advised that PPW was sent with another fax number provider by case worker. Pt also advised that he can come and pick a copy up. Pt verbalized understanding. Also, Pt stated that an ENT referral needed to be placed per Audiologist for a " crossover unit in ear" for Left ear deafness.  ? ?[place] for referral? Pt saw ENT doctor when he was out of stated before seeing the audiologist and finding out that he had deafness in left ear.  ?

## 2021-07-02 NOTE — Telephone Encounter (Signed)
Pt will pick up FMLA forms today. ?

## 2021-07-02 NOTE — Telephone Encounter (Signed)
Per representative PPW needs to be faxed to [phone] to Honor Loh that's handling the case. Faxed PPW however, Thomes Dinning advised that the copy was dark. I faxed PPW again will call Jerrica to see if it is still dark. ?

## 2021-07-03 ENCOUNTER — Encounter: Payer: Self-pay | Admitting: Internal Medicine

## 2021-07-03 ENCOUNTER — Telehealth: Payer: Self-pay

## 2021-07-03 ENCOUNTER — Ambulatory Visit (INDEPENDENT_AMBULATORY_CARE_PROVIDER_SITE_OTHER): Payer: BC Managed Care – PPO | Admitting: Internal Medicine

## 2021-07-03 ENCOUNTER — Other Ambulatory Visit: Payer: Self-pay

## 2021-07-03 VITALS — BP 130/72 | HR 75 | Ht 72.0 in | Wt 314.0 lb

## 2021-07-03 DIAGNOSIS — I5020 Unspecified systolic (congestive) heart failure: Secondary | ICD-10-CM | POA: Diagnosis not present

## 2021-07-03 DIAGNOSIS — I483 Typical atrial flutter: Secondary | ICD-10-CM | POA: Diagnosis not present

## 2021-07-03 MED ORDER — TORSEMIDE 20 MG PO TABS: 20.0000 mg | ORAL_TABLET | Freq: Every day | ORAL | 3 refills | Status: DC

## 2021-07-03 NOTE — Patient Instructions (Signed)
Medication Instructions:  ?Your physician recommends that you continue on your current medications as directed. Please refer to the Current Medication list given to you today. ? ?*If you need a refill on your cardiac medications before your next appointment, please call your pharmacy* ? ? ?Lab Work: ?None ordered. ? ?If you have labs (blood work) drawn today and your tests are completely normal, you will receive your results only by: ?MyChart Message (if you have MyChart) OR ?A paper copy in the mail ?If you have any lab test that is abnormal or we need to change your treatment, we will call you to review the results. ? ? ?Testing/Procedures: ?None ordered. ? ? ? ?Follow-Up: ?At [facility], you and your health needs are our priority.  As part of our continuing mission to provide you with exceptional heart care, we have created designated Provider Care Teams.  These Care Teams include your primary Cardiologist (physician) and Advanced Practice Providers (APPs -  Physician Assistants and Nurse Practitioners) who all work together to provide you with the care you need, when you need it. ? ?We recommend signing up for the patient portal called "MyChart".  Sign up information is provided on this After Visit Summary.  MyChart is used to connect with patients for Virtual Visits (Telemedicine).  Patients are able to view lab/test results, encounter notes, upcoming appointments, etc.  Non-urgent messages can be sent to your provider as well.   ?To learn more about what you can do with MyChart, go to [email or web address].   ? ?Your next appointment:   ?Keep follow up appointment with Dr Gasper Sells 10/2021 ?

## 2021-07-03 NOTE — Telephone Encounter (Signed)
-----   Message from Werner Lean, MD sent at 07/02/2021  3:36 PM EDT ----- ?Results: ?Stable Creatinine, Elevated BNP, coupled with is LE swelling from yesterday ?Plan: ?Increase torsemide to 20 mg; BMP in 7- 10 days ? ?Werner Lean, MD ? ?

## 2021-07-03 NOTE — Progress Notes (Signed)
? ? ? ? ?Patient Care Team: ?Dorothyann Peng, NP as PCP - General (Family Medicine) ?Werner Lean, MD as PCP - Cardiology (Cardiology) ?Deboraha Sprang, MD as PCP - Electrophysiology (Cardiology) ?Pyrtle, Lajuan Lines, MD as Consulting Physician (Gastroenterology) ?Lester Kinsman (Dermatology) ? ? ?HPI ? ?Matthew Savage is a 61 y.o. male seen again following a flutter ablation 2021. ? ?History of coronary disease with non-STEMI 6/22.  Underwent CABG ASD repair and left atrial clipping.  Procedure complicated by bacteremia serratia, sternal wound infection requiring VAC placement and acute renal injury. ? ?Hospitalized intercurrently in [place] for "vertigo "also associated with acute sensorineural hearing loss.  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 ? ?Denies palpitations.  Denies shortness of breath.  Is active at the gym. ? ?Saw Dr. Jennette Bill earlier this week.  Continue Wilder Glade and was going to adjust his Demadex on the basis of his BNP which] more than thousand ? ?DATE TEST EF   ?      ?     ?6/22 Echo  40-45% LVH  mod  ?2/23 Echo  60-65%    ? ?History of lower extremity DVT ? ? ?Records and Results Reviewed  ? ?Past Medical History:  ?Diagnosis Date  ? Blood in urine   ? Class 2 obesity 09/30/2020  ? DDD (degenerative disc disease)   ? DVT (deep venous thrombosis) ([place]) 2017 & 2018  ? Gout   ? Heart murmur   ? Hyperlipidemia   ? Hypertension   ? Numbness and tingling   ? right arm and hand  ? SCC (squamous cell carcinoma)   ? skin, squamous cell, face  ? ? ?Past Surgical History:  ?Procedure Laterality Date  ? A-FLUTTER ABLATION N/A 03/11/2020  ? Procedure: A-FLUTTER ABLATION;  Surgeon: Deboraha Sprang, MD;  Location: [facility];  Service: Cardiovascular;  Laterality: N/A;  ? APPLICATION OF WOUND VAC  10/18/2020  ? Procedure: APPLICATION OF WOUND VAC;  Surgeon: Lajuana Matte, MD;  Location: [facility];  Service: Thoracic;;  ? ASD REPAIR N/A 10/03/2020  ? Procedure: ATRIAL SEPTAL DEFECT (ASD) REPAIR WITH PERI GUARD  PERICARDIUM;  Surgeon: Wonda Olds, MD;  Location: [facility];  Service: Open Heart Surgery;  Laterality: N/A;  ? CLIPPING OF ATRIAL APPENDAGE Left 10/03/2020  ? Procedure: CLIPPING OF ATRIAL APPENDAGE WITH ATRICURE 72 CLIP;  Surgeon: Wonda Olds, MD;  [facility];  Service: Open Heart Surgery;  Laterality: Left;  ? CORONARY ARTERY BYPASS GRAFT N/A 10/03/2020  ? Procedure: CORONARY ARTERY BYPASS GRAFTING (CABG) X FOUR, USING BILATERAL MAMMARY ARTERIES AND RIGHT ENDOSCOPIC GREATER SAPHENOUS VEIN CONDUITS;  Surgeon: Wonda Olds, MD;  Location: [facility];  Service: Open Heart Surgery;  Laterality: N/A;  ? [place]  ? X 2  ? IR THORACENTESIS ASP PLEURAL SPACE W/IMG GUIDE  10/28/2020  ? LAPAROSCOPIC GASTRIC BANDING  03/2009  ? in [place], [place]  ? LEFT HEART CATH AND CORONARY ANGIOGRAPHY N/A 10/01/2020  ? Procedure: LEFT HEART CATH AND CORONARY ANGIOGRAPHY;  Surgeon: Troy Sine, MD;  Location: [facility];  Service: Cardiovascular;  Laterality: N/A;  ? MASS EXCISION  12/03/2011  ? X 2 (gluteal) X 1 (lower back)  ? MOHS SURGERY    ? x 2  ? STERNAL WOUND DEBRIDEMENT N/A 10/18/2020  ? Procedure: STERNAL WOUND DEBRIDEMENT;  Surgeon: Lajuana Matte, MD;  Location: [facility];  Service: Thoracic;  Laterality: N/A;  ? STERNAL WOUND  DEBRIDEMENT N/A 10/23/2020  ? Procedure: WOUND VAC CHANGE with APPLICATION OF MYRIAD MORCELLS;  Surgeon: Wonda Olds, MD;  Location: [facility];  Service: Thoracic;  Laterality: N/A;  ? TEE WITHOUT CARDIOVERSION N/A 10/03/2020  ? Procedure: TRANSESOPHAGEAL ECHOCARDIOGRAM (TEE);  Surgeon: Wonda Olds, MD;  Location: [facility];  Service: Open Heart Surgery;  Laterality: N/A;  ? ? ?Current Meds  ?Medication Sig  ? acetaminophen (TYLENOL) 325 MG tablet Take 2 tablets (650 mg total) by mouth every 6 (six) hours as needed for fever.  ? allopurinol (ZYLOPRIM) 300 MG tablet TAKE 1 TABLET BY MOUTH EVERY DAY  ? amiodarone (PACERONE) 200 MG tablet TAKE 1 TABLET BY MOUTH EVERY DAY  ?  atorvastatin (LIPITOR) 80 MG tablet TAKE 1 TABLET BY MOUTH EVERY DAY  ? cetirizine (ZYRTEC) 10 MG tablet Take 10 mg by mouth at bedtime.  ? colchicine 0.6 MG tablet Take 0.6 mg by mouth daily as needed (Flair up gout).   ? FARXIGA 10 MG TABS tablet TAKE 1 TABLET BY MOUTH DAILY BEFORE BREAKFAST.  ? ferrous [id number] C-folic acid (TRINSICON / FOLTRIN) capsule Take 1 capsule by mouth daily.  ? gentamicin-prednisoLONE 0.3-1 % ophthalmic drops 1 drop 1 (one) time each day.  ? metoprolol succinate (TOPROL-XL) 25 MG 24 hr tablet TAKE 1 TABLET BY MOUTH DAILY. TAKE WITH OR IMMEDIATELY FOLLOWING A MEAL.  ? oxyCODONE (OXY IR/ROXICODONE) 5 MG immediate release tablet Take 1 tablet (5 mg total) by mouth every 6 (six) hours as needed for severe pain.  ? rivaroxaban (XARELTO) 20 MG TABS tablet Take 1 tablet (20 mg total) by mouth daily with supper.  ? torsemide (DEMADEX) 10 MG tablet Take 1 tablet (10 mg total) by mouth daily.  ? valACYclovir (VALTREX) 500 MG tablet Take 500 mg by mouth daily.  ? ? ?Allergies  ?Allergen Reactions  ? Levaquin [Levofloxacin]   ?  Aortic Aneurysm  ? ? ? ? ?Review of Systems negative except from HPI and PMH ? ?Physical Exam ?BP 130/72 (BP Location: Right Arm, Patient Position: Sitting, Cuff Size: Large)   Pulse 75   Ht 6' (1.829 m)   Wt (!) 314 lb (142.4 kg)   SpO2 94%   BMI 42.59 kg/m?  ?Well developed and well nourished in no acute distress ?HENT normal ?E scleral and icterus clear ?Neck Supple ?JVP <10 carotids brisk and full ?Clear to ausculation ?Regular rate and rhythm, no murmurs gallops or rub ?Soft with active bowel sounds ?No clubbing cyanosis 2+ on the left and 1+ on right edema ?Alert and oriented, grossly normal motor and sensory function ?Skin Warm and Dry ? ?ECG atrial tachycardia with atrial cycle length 320 ms frequent PVCs left bundle inferior axis probably RVOT ? ?12/22 atrial flutter with an atrial cycle length of 300 ms ?10/17/2020 atrial flutter with an atrial  cycle length 320 ms ?10/12/2020 atrial flutter Atrial cycle length of 280 ms ?10/07/2020 SVT at a cycle length of 320 ms ?10/04/2020 junctional rhythm at 49 ?09/30/2020 sinus rhythm with frequent PVCs ? ?Estimated Creatinine Clearance: 65 mL/min (A) (by C-G formula based on SCr of 1.77 mg/dL (H)). ? ? ?Assessment and  Plan ?Atrial flutter status post ablation ? ?Atrial flutter-SVT with similar but distinct rate ranging from 280--320 ms ? ?ASD with an atriotomy ? ?Coronary artery disease with prior bypass ? ?PVCs-frequent ? ?Sinus node dysfunction ? ? ? ? ?The patient has frequent PVCs that are asymptomatic.  There is no evidence of an impact on his  LV function.  Hence, we will follow him without therapy. ? ?His atrial arrhythmias appear to be possibly 2 but may be just 1 with some variability in cycle length.  It is an atypical flutter.  Could potentially be related to his atriotomy from his ASD repair because it does not appear to be reversed typical ? ?We discussed treatment options including doing nothing in the absence of symptoms, cardioversion with which I fear we might end up with sinus node dysfunction requiring pacing based on the electrocardiogram 10/04/2020, or EP study with catheter ablation.  This might allow for the discontinuation of anticoagulation although I suspect he would have a high likelihood of atrial fibrillation and would be inclined at that point to implant a loop recorder to follow his atrial arrhythmia so as to identify recurrent read need for anticoagulation if it were to develop. ? ?He is significantly volume overloaded.  I please increase his Demadex as Dr. Ezra Sites as outlined in his notes think the plan is unfolding will defer to him ? ? ? ?He would like to follow-up primarily with Dr. [facility] we will see him as needed ? ? ?41 min was spent in care of the patient including the review of records  ? ? ? ?Current medicines are reviewed at length with the patient today .  The patient does  not  have concerns regarding medicines. ? ?

## 2021-07-03 NOTE — Telephone Encounter (Signed)
The patient has been notified of the result and verbalized understanding.  All questions (if any) were answered. ?Precious Gilding, RN 07/03/2021 3:57 PM  F/U labs scheduled for 07/16/21 per pt request reports will be starting back to work and won't be able to come in.  ?

## 2021-07-04 ENCOUNTER — Encounter: Payer: Self-pay | Admitting: Adult Health

## 2021-07-04 ENCOUNTER — Other Ambulatory Visit: Payer: Self-pay | Admitting: Cardiology

## 2021-07-04 ENCOUNTER — Ambulatory Visit (INDEPENDENT_AMBULATORY_CARE_PROVIDER_SITE_OTHER): Payer: BC Managed Care – PPO | Admitting: Adult Health

## 2021-07-04 VITALS — BP 120/70 | HR 76 | Temp 97.8°F | Ht 72.0 in | Wt 315.0 lb

## 2021-07-04 DIAGNOSIS — H905 Unspecified sensorineural hearing loss: Secondary | ICD-10-CM | POA: Diagnosis not present

## 2021-07-04 DIAGNOSIS — I825Z2 Chronic embolism and thrombosis of unspecified deep veins of left distal lower extremity: Secondary | ICD-10-CM | POA: Diagnosis not present

## 2021-07-04 DIAGNOSIS — I5020 Unspecified systolic (congestive) heart failure: Secondary | ICD-10-CM

## 2021-07-04 DIAGNOSIS — H8110 Benign paroxysmal vertigo, unspecified ear: Secondary | ICD-10-CM | POA: Diagnosis not present

## 2021-07-04 DIAGNOSIS — I712 Thoracic aortic aneurysm, without rupture, unspecified: Secondary | ICD-10-CM

## 2021-07-04 MED ORDER — RIVAROXABAN 20 MG PO TABS: 20.0000 mg | ORAL_TABLET | Freq: Every day | ORAL | 3 refills | Status: DC

## 2021-07-04 NOTE — Progress Notes (Signed)
? ?Subjective:  ? ? Patient ID: Matthew Savage, male    DOB: 07/09/1960, 61 y.o.   MRN: [id number] ? ?HPI ? ?61 year old male who  has a past medical history of Blood in urine, Class 2 obesity (09/30/2020), DDD (degenerative disc disease), DVT (deep venous thrombosis) ([place]) (2017 & 2018), Gout, Heart murmur, Hyperlipidemia, Hypertension, Numbness and tingling, and SCC (squamous cell carcinoma). ? ?He presents to the office today for follow-up after recent hospitalization while in [place] where he was visiting his brother.  He was hospitalized for vertigo and acute sensorial neural hearing loss.  He was seen by an audiologist and reports that he is deaf in his left ear.  He has been referred to ear nose and throat for possible device and may be able to help him. ? ?He has not had any new symptoms.  Has been evaluated by cardiology earlier this week.  Since returning to [place] he has been out of work until he has been seen by all of his providers, got bored and went to the gym.  He has been able to work out at the gym with no chest pain, shortness of breath, palpitations, or syncope. ? ?His torsemide dose was increased from 10 mg to 20 mg after he was seen by cardiology due to an elevated BNP. ? ?He denies dizziness but does feel off balance if he tries to move to fast, this lasts a few seconds.  ? ? ?Review of Systems ?See HPI  ? ?Past Medical History:  ?Diagnosis Date  ? Blood in urine   ? Class 2 obesity 09/30/2020  ? DDD (degenerative disc disease)   ? DVT (deep venous thrombosis) ([place]) 2017 & 2018  ? Gout   ? Heart murmur   ? Hyperlipidemia   ? Hypertension   ? Numbness and tingling   ? right arm and hand  ? SCC (squamous cell carcinoma)   ? skin, squamous cell, face  ? ? ?Social History  ? ?Socioeconomic History  ? Marital status: Single  ?  Spouse name: Not on file  ? Number of children: Not on file  ? Years of education: Not on file  ? Highest education level: Associate degree: occupational, Hotel manager, or  vocational program  ?Occupational History  ? Not on file  ?Tobacco Use  ? Smoking status: Former  ?  Packs/day: 2.00  ?  Years: 36.00  ?  Pack years: 72.00  ?  Types: Cigarettes  ?  Quit date: 01/01/2020  ?  Years since quitting: 1.5  ? Smokeless tobacco: Never  ?Substance and Sexual Activity  ? Alcohol use: Yes  ?  Alcohol/week: 12.0 standard drinks  ?  Types: 12 Standard drinks or equivalent per week  ?  Comment: 2 per day  ? Drug use: No  ? Sexual activity: Not on file  ?Other Topics Concern  ? Not on file  ?Social History Narrative  ? 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   ? Married   ? No kids  ? ?Social Determinants of Health  ? ?Financial Resource Strain: Low Risk   ? Difficulty of Paying Living Expenses: Not hard at all  ?Food Insecurity: No Food Insecurity  ? Worried About Charity fundraiser in the Last Year: Never true  ? Ran Out of Food in the Last Year: Never true  ?Transportation Needs: No Transportation Needs  ? Lack of Transportation (Medical): No  ? Lack of Transportation (Non-Medical): No  ?  Physical Activity: Sufficiently Active  ? Days of Exercise per Week: 3 days  ? Minutes of Exercise per Session: 60 min  ?Stress: No Stress Concern Present  ? Feeling of Stress : Not at all  ?Social Connections: Socially Isolated  ? Frequency of Communication with Friends and Family: Three times a week  ? Frequency of Social Gatherings with Friends and Family: Twice a week  ? Attends Religious Services: Never  ? Active Member of Clubs or Organizations: No  ? Attends Archivist Meetings: Not on file  ? Marital Status: Divorced  ?Intimate Partner Violence: Not on file  ? ? ?Past Surgical History:  ?Procedure Laterality Date  ? A-FLUTTER ABLATION N/A 03/11/2020  ? Procedure: A-FLUTTER ABLATION;  Surgeon: Deboraha Sprang, MD;  Location: [facility];  Service: Cardiovascular;  Laterality: N/A;  ? APPLICATION OF WOUND VAC  10/18/2020  ? Procedure: APPLICATION OF WOUND VAC;  Surgeon: Lajuana Matte, MD;  Location: [facility];  Service: Thoracic;;  ? ASD REPAIR N/A 10/03/2020  ? Procedure: ATRIAL SEPTAL DEFECT (ASD) REPAIR WITH PERI GUARD PERICARDIUM;  Surgeon: Wonda Olds, MD;  Location: [facility];  Service: Open Heart Surgery;  Laterality: N/A;  ? CLIPPING OF ATRIAL APPENDAGE Left 10/03/2020  ? Procedure: CLIPPING OF ATRIAL APPENDAGE WITH ATRICURE 75 CLIP;  Surgeon: Wonda Olds, MD;  [facility];  Service: Open Heart Surgery;  Laterality: Left;  ? CORONARY ARTERY BYPASS GRAFT N/A 10/03/2020  ? Procedure: CORONARY ARTERY BYPASS GRAFTING (CABG) X FOUR, USING BILATERAL MAMMARY ARTERIES AND RIGHT ENDOSCOPIC GREATER SAPHENOUS VEIN CONDUITS;  Surgeon: Wonda Olds, MD;  Location: [facility];  Service: Open Heart Surgery;  Laterality: N/A;  ? [place]  ? X 2  ? IR THORACENTESIS ASP PLEURAL SPACE W/IMG GUIDE  10/28/2020  ? LAPAROSCOPIC GASTRIC BANDING  03/2009  ? in [place], [place]  ? LEFT HEART CATH AND CORONARY ANGIOGRAPHY N/A 10/01/2020  ? Procedure: LEFT HEART CATH AND CORONARY ANGIOGRAPHY;  Surgeon: Troy Sine, MD;  Location: [facility];  Service: Cardiovascular;  Laterality: N/A;  ? MASS EXCISION  12/03/2011  ? X 2 (gluteal) X 1 (lower back)  ? MOHS SURGERY    ? x 2  ? STERNAL WOUND DEBRIDEMENT N/A 10/18/2020  ? Procedure: STERNAL WOUND DEBRIDEMENT;  Surgeon: Lajuana Matte, MD;  Location: [facility];  Service: Thoracic;  Laterality: N/A;  ? STERNAL WOUND DEBRIDEMENT N/A 10/23/2020  ? Procedure: WOUND VAC CHANGE with APPLICATION OF MYRIAD MORCELLS;  Surgeon: Wonda Olds, MD;  Location: [facility];  Service: Thoracic;  Laterality: N/A;  ? TEE WITHOUT CARDIOVERSION N/A 10/03/2020  ? Procedure: TRANSESOPHAGEAL ECHOCARDIOGRAM (TEE);  Surgeon: Wonda Olds, MD;  Location: [facility];  Service: Open Heart Surgery;  Laterality: N/A;  ? ? ?Family History  ?Problem Relation Age of Onset  ? Breast cancer Mother   ? Other Mother   ?     small cell carcinoma  ? Hypertension Father   ? Colon polyps  Father   ? Colon polyps Brother   ? ? ?Allergies  ?Allergen Reactions  ? Levaquin [Levofloxacin]   ?  Aortic Aneurysm  ? ? ?Current Outpatient Medications on File Prior to Visit  ?Medication Sig Dispense Refill  ? acetaminophen (TYLENOL) 325 MG tablet Take 2 tablets (650 mg total) by mouth every 6 (six) hours as needed for fever.    ? allopurinol (ZYLOPRIM) 300 MG tablet TAKE 1 TABLET BY  MOUTH EVERY DAY 90 tablet 0  ? amiodarone (PACERONE) 200 MG tablet TAKE 1 TABLET BY MOUTH EVERY DAY 90 tablet 1  ? atorvastatin (LIPITOR) 80 MG tablet TAKE 1 TABLET BY MOUTH EVERY DAY 90 tablet 1  ? cetirizine (ZYRTEC) 10 MG tablet Take 10 mg by mouth at bedtime.    ? colchicine 0.6 MG tablet Take 0.6 mg by mouth daily as needed (Flair up gout).     ? FARXIGA 10 MG TABS tablet TAKE 1 TABLET BY MOUTH DAILY BEFORE BREAKFAST. 30 tablet 1  ? ferrous [id number] C-folic acid (TRINSICON / FOLTRIN) capsule Take 1 capsule by mouth daily.    ? gentamicin-prednisoLONE 0.3-1 % ophthalmic drops 1 drop 1 (one) time each day.    ? metoprolol succinate (TOPROL-XL) 25 MG 24 hr tablet TAKE 1 TABLET BY MOUTH DAILY. TAKE WITH OR IMMEDIATELY FOLLOWING A MEAL. 90 tablet 1  ? oxyCODONE (OXY IR/ROXICODONE) 5 MG immediate release tablet Take 1 tablet (5 mg total) by mouth every 6 (six) hours as needed for severe pain. 24 tablet 0  ? rivaroxaban (XARELTO) 20 MG TABS tablet Take 1 tablet (20 mg total) by mouth daily with supper. 30 tablet 0  ? torsemide (DEMADEX) 20 MG tablet Take 1 tablet (20 mg total) by mouth daily. 90 tablet 3  ? valACYclovir (VALTREX) 500 MG tablet Take 500 mg by mouth daily.    ? ?No current facility-administered medications on file prior to visit.  ? ? ?There were no vitals taken for this visit. ? ? ?   ?Objective:  ? Physical Exam ?Vitals and nursing note reviewed.  ?Constitutional:   ?   Appearance: Normal appearance. He is obese.  ?Cardiovascular:  ?   Rate and Rhythm: Normal rate and regular rhythm.  ?   Pulses: Normal  pulses.  ?   Heart sounds: Normal heart sounds.  ?Pulmonary:  ?   Effort: Pulmonary effort is normal.  ?   Breath sounds: Normal breath sounds.  ?Musculoskeletal:     ?   General: Normal range of motion.

## 2021-07-07 ENCOUNTER — Encounter: Payer: Self-pay | Admitting: Adult Health

## 2021-07-13 ENCOUNTER — Other Ambulatory Visit: Payer: Self-pay | Admitting: Internal Medicine

## 2021-07-14 ENCOUNTER — Encounter: Payer: Self-pay | Admitting: Internal Medicine

## 2021-07-16 ENCOUNTER — Other Ambulatory Visit: Payer: BC Managed Care – PPO

## 2021-07-18 ENCOUNTER — Other Ambulatory Visit: Payer: BC Managed Care – PPO | Admitting: *Deleted

## 2021-07-18 DIAGNOSIS — I5020 Unspecified systolic (congestive) heart failure: Secondary | ICD-10-CM

## 2021-07-18 LAB — BASIC METABOLIC PANEL
BUN/Creatinine Ratio: 14 (ref 10–24)
BUN: 23 mg/dL (ref 8–27)
CO2: 25 mmol/L (ref 20–29)
Calcium: 9.4 mg/dL (ref 8.6–10.2)
Chloride: 101 mmol/L (ref 96–106)
Creatinine, Ser: 1.67 mg/dL — ABNORMAL HIGH (ref 0.76–1.27)
Glucose: 85 mg/dL (ref 70–99)
Potassium: 4.4 mmol/L (ref 3.5–5.2)
Sodium: 139 mmol/L (ref 134–144)
eGFR: 47 mL/min/{1.73_m2} — ABNORMAL LOW (ref >59)

## 2021-07-28 NOTE — Telephone Encounter (Signed)
[employer] paperwork faxed.  ?

## 2021-07-30 ENCOUNTER — Ambulatory Visit (INDEPENDENT_AMBULATORY_CARE_PROVIDER_SITE_OTHER): Payer: BC Managed Care – PPO | Admitting: Internal Medicine

## 2021-07-30 DIAGNOSIS — Z5181 Encounter for therapeutic drug level monitoring: Secondary | ICD-10-CM

## 2021-07-30 LAB — PULMONARY FUNCTION TEST
DL/VA % pred: 105 %
DL/VA: 4.43 ml/min/mmHg/L
DLCO cor % pred: 83 %
DLCO cor: 23.71 ml/min/mmHg
DLCO unc % pred: 83 %
DLCO unc: 23.71 ml/min/mmHg
FEF 25-75 Post: 1.41 L/sec
FEF 25-75 Pre: 1.14 L/sec
FEF2575-%Change-Post: 24 %
FEF2575-%Pred-Post: 46 %
FEF2575-%Pred-Pre: 37 %
FEV1-%Change-Post: 6 %
FEV1-%Pred-Post: 60 %
FEV1-%Pred-Pre: 56 %
FEV1-Post: 2.26 L
FEV1-Pre: 2.13 L
FEV1FVC-%Change-Post: 3 %
FEV1FVC-%Pred-Pre: 86 %
FEV6-%Change-Post: 3 %
FEV6-%Pred-Post: 69 %
FEV6-%Pred-Pre: 66 %
FEV6-Post: 3.26 L
FEV6-Pre: 3.14 L
FEV6FVC-%Change-Post: 1 %
FEV6FVC-%Pred-Post: 103 %
FEV6FVC-%Pred-Pre: 101 %
FVC-%Change-Post: 2 %
FVC-%Pred-Post: 67 %
FVC-%Pred-Pre: 65 %
FVC-Post: 3.31 L
FVC-Pre: 3.24 L
Post FEV1/FVC ratio: 68 %
Post FEV6/FVC ratio: 98 %
Pre FEV1/FVC ratio: 66 %
Pre FEV6/FVC Ratio: 97 %
RV % pred: 165 %
RV: 3.79 L
TLC % pred: 109 %
TLC: 7.9 L

## 2021-07-30 NOTE — Progress Notes (Signed)
PFT done today. 

## 2021-08-05 ENCOUNTER — Telehealth: Payer: Self-pay

## 2021-08-05 MED ORDER — ALBUTEROL SULFATE HFA 108 (90 BASE) MCG/ACT IN AERS: 2.0000 | INHALATION_SPRAY | Freq: Four times a day (QID) | RESPIRATORY_TRACT | 2 refills | Status: DC | PRN

## 2021-08-05 NOTE — Telephone Encounter (Signed)
The patient has been notified of the result and verbalized understanding.  All questions (if any) were answered. ?Precious Gilding, RN 08/05/2021 1:20 PM   ?Pt reports already established with pulmonology Dr. Elsworth Soho.  Pt last saw MD in Oct 2021.  Will send a message to MD office.   ?

## 2021-08-05 NOTE — Telephone Encounter (Signed)
-----   Message from Werner Lean, MD sent at 08/03/2021  3:13 PM EDT ----- ?Results: ?Significant COPD ?Plan: ?No evidence of amiodarone issues ?Albuterol PRN inhaler ?Pulm follow up ? ?Werner Lean, MD ? ?

## 2021-08-08 ENCOUNTER — Telehealth: Payer: Self-pay

## 2021-08-08 NOTE — Telephone Encounter (Signed)
Called and spoke with patient to get him scheduled for OV with Dr. Elsworth Soho to go over PFT's. Patient has been scheduled. Nothing further needed at this time. ? ?Next Appt ?With Pulmonology Davonna Belling V. Elsworth Soho, MD) ?08/12/2021 at 10:15 AM ?

## 2021-08-08 NOTE — Telephone Encounter (Signed)
-----   Message from Rigoberto Noel, MD sent at 08/05/2021  4:56 PM EDT ----- ?Regarding: RE: PFT f/u ? ?We can see. ?[place], ?Please make FU appt with me next available to discuss PFTs ? ?RA ?----- Message ----- ?From: Precious Gilding, RN ?Sent: 08/05/2021   1:24 PM EDT ?To: Rigoberto Noel, MD ?Subject: PFT f/u                                       ? ?Dr. Rudean Haskell ordered this pt to have PFT based on results pt  referred to pulmonology.  Pt reports established with you; last seen in Oct 2021.  Could you determine if f/u is needed. ? ?Thanks,  ?Elza Rafter, RN  ? ? ?

## 2021-08-12 ENCOUNTER — Encounter: Payer: Self-pay | Admitting: Pulmonary Disease

## 2021-08-12 ENCOUNTER — Ambulatory Visit (INDEPENDENT_AMBULATORY_CARE_PROVIDER_SITE_OTHER): Payer: BC Managed Care – PPO | Admitting: Pulmonary Disease

## 2021-08-12 ENCOUNTER — Ambulatory Visit (INDEPENDENT_AMBULATORY_CARE_PROVIDER_SITE_OTHER): Payer: BC Managed Care – PPO

## 2021-08-12 VITALS — BP 132/86 | HR 77 | Temp 98.0°F | Ht 71.0 in | Wt 321.6 lb

## 2021-08-12 DIAGNOSIS — J90 Pleural effusion, not elsewhere classified: Secondary | ICD-10-CM | POA: Diagnosis not present

## 2021-08-12 DIAGNOSIS — G4733 Obstructive sleep apnea (adult) (pediatric): Secondary | ICD-10-CM

## 2021-08-12 DIAGNOSIS — J432 Centrilobular emphysema: Secondary | ICD-10-CM

## 2021-08-12 MED ORDER — TRELEGY ELLIPTA 200-62.5-25 MCG/ACT IN AEPB
1.0000 | INHALATION_SPRAY | Freq: Every day | RESPIRATORY_TRACT | 0 refills | Status: DC
Start: 2021-08-12 — End: 2022-01-15

## 2021-08-12 NOTE — Progress Notes (Signed)
? ?  Subjective:  ? ? Patient ID: Matthew Savage, male    DOB: 12/03/1960, 61 y.o.   MRN: [id number] ? ?HPI ? ? 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 ?  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 ?He was hospitalized 11/2019 in [place] for UTI and atrial flutter.  ?He was a 2 pack/day smoker, more than 70 pack years , he just quit smoking 22 days ago using a nicotine patch ? ?Last seen 01/2020  ?Dr. Rudean Haskell ordered this pt to have PFT based on results pt  referred to pulmonology ?He had an initial office visit with [place] where he was diagnosed with severe OSA and placed on auto CPAP therapy.  He has been compliant with CPAP since then. ?PFTs also showed significant airway obstruction but he has only been using albuterol on a as needed basis.  He denies significant dyspnea. ?Interim he underwent CABG 0/3212, he had a complicated course with mediastinal abscess and pleural effusions requiring thoracenteses and reexploration x2.  He required IV antibiotics for Serratia bacteremia for 2 to 3 months. ?He was hospitalized again February 2023 for vertigo and atrial fibrillation, echo showed improved EF, he underwent vestibular therapy and has developed deafness, has been seen by ENT ? ?Chest x-ray 05/2021 reported as cardiomegaly bibasal consolidation ? ?I reviewed cardiology evaluation and hospitalization records in care everywhere ? ?Significant tests/ events reviewed ? ?HST 01/2020 severe OSA with AHI 68/ hr ?11/2019 CT angio chest [facility]) >> neg PE , mild emphysema ? ?09/2020 Ct chest  Chronic post granulomatous sequelae with bulky calcified subcarinal lymph nodes. Smaller calcified left hilar lymph nodes ? ?PFTs 07/2021 ratio 66, FEV1 56%, FVC 65%, DLCO 83% ?PFTs 09/2020 ratio 64, FEV1 54%, FVC 64% ?PFTs 01/2020 ratio 68, FEV1 66%, FVC 74%, DLCO 21.5/84% ? ?Review of  Systems ?neg for any significant sore throat, dysphagia, itching, sneezing, nasal congestion or excess/ purulent secretions, fever, chills, sweats, unintended wt loss, pleuritic or exertional cp, hempoptysis, orthopnea pnd or change in chronic leg swelling. Also denies presyncope, palpitations, heartburn, abdominal pain, nausea, vomiting, diarrhea or change in bowel or urinary habits, dysuria,hematuria, rash, arthralgias, visual complaints, headache, numbness weakness or ataxia. ? ?   ?Objective:  ? Physical Exam ? ?Gen. Pleasant, obese, in no distress ?ENT - no lesions, no post nasal drip ?Neck: No JVD, no thyromegaly, no carotid bruits ?Lungs: no use of accessory muscles, no dullness to percussion, decreased without rales or rhonchi  ?Cardiovascular: Rhythm regular, heart sounds  normal, no murmurs or gallops, 1+ peripheral edema ?Musculoskeletal: No deformities, no cyanosis or clubbing , no tremors ? ? ? ?   ?Assessment & Plan:  ? ? ?History of bibasal pneumonia in February -chest x-ray obtained today which to my independent review, shows resolution of bibasilar infiltrates, no effusions ? ?

## 2021-08-12 NOTE — Assessment & Plan Note (Signed)
I reviewed download on his phone which shows excellent compliance and no residual events on CPAP ?Weight loss encouraged, compliance with goal of at least 4-6 hrs every night is the expectation. ?Advised against medications with sedative side effects ?Cautioned against driving when sleepy - understanding that sleepiness will vary on a day to day basis ? ?

## 2021-08-12 NOTE — Patient Instructions (Addendum)
? ?  X CXR today  ? ?X Sample of Anoro once daily - rinse mouth after use, call for Rx if this works ? ? ?

## 2021-08-12 NOTE — Assessment & Plan Note (Signed)
He has moderate airway obstruction on PFTs, FEV1 seems to have dropped in 2022 compared to 2021. ?We will start him on triple therapy with Trelegy even though he is relatively not very symptomatic.  I gave him a sample today and he will report back, alternatives for cost reasons would be LAMA/LABA such as Anoro/Stiolto ?

## 2021-08-27 ENCOUNTER — Encounter: Payer: Self-pay | Admitting: Internal Medicine

## 2021-08-27 ENCOUNTER — Encounter: Payer: Self-pay | Admitting: Adult Health

## 2021-08-27 NOTE — Telephone Encounter (Signed)
FYI

## 2021-09-05 ENCOUNTER — Other Ambulatory Visit: Payer: Self-pay | Admitting: Adult Health

## 2021-09-05 DIAGNOSIS — M109 Gout, unspecified: Secondary | ICD-10-CM

## 2021-09-11 ENCOUNTER — Other Ambulatory Visit: Payer: Self-pay | Admitting: Physician Assistant

## 2021-09-11 DIAGNOSIS — H903 Sensorineural hearing loss, bilateral: Secondary | ICD-10-CM

## 2021-09-16 ENCOUNTER — Other Ambulatory Visit: Payer: Self-pay | Admitting: Internal Medicine

## 2021-09-16 ENCOUNTER — Other Ambulatory Visit: Payer: Self-pay | Admitting: Adult Health

## 2021-09-16 DIAGNOSIS — I4891 Unspecified atrial fibrillation: Secondary | ICD-10-CM

## 2021-09-16 DIAGNOSIS — I825Z2 Chronic embolism and thrombosis of unspecified deep veins of left distal lower extremity: Secondary | ICD-10-CM

## 2021-09-16 DIAGNOSIS — I10 Essential (primary) hypertension: Secondary | ICD-10-CM

## 2021-09-28 ENCOUNTER — Ambulatory Visit
Admission: RE | Admit: 2021-09-28 | Discharge: 2021-09-28 | Disposition: A | Discharge status: Home / Self Care | Payer: BC Managed Care – PPO | Source: Ambulatory Visit | Attending: Physician Assistant | Admitting: Physician Assistant

## 2021-09-28 DIAGNOSIS — H903 Sensorineural hearing loss, bilateral: Secondary | ICD-10-CM

## 2021-10-03 ENCOUNTER — Encounter: Payer: Self-pay | Admitting: Adult Health

## 2021-10-07 ENCOUNTER — Ambulatory Visit (INDEPENDENT_AMBULATORY_CARE_PROVIDER_SITE_OTHER): Payer: BC Managed Care – PPO | Admitting: Adult Health

## 2021-10-07 ENCOUNTER — Encounter: Payer: Self-pay | Admitting: Adult Health

## 2021-10-07 VITALS — BP 110/80 | HR 80 | Temp 98.8°F | Ht 71.0 in | Wt 319.0 lb

## 2021-10-07 DIAGNOSIS — L0291 Cutaneous abscess, unspecified: Secondary | ICD-10-CM

## 2021-10-07 DIAGNOSIS — L089 Local infection of the skin and subcutaneous tissue, unspecified: Secondary | ICD-10-CM | POA: Diagnosis not present

## 2021-10-07 DIAGNOSIS — M545 Low back pain, unspecified: Secondary | ICD-10-CM

## 2021-10-07 MED ORDER — CYCLOBENZAPRINE HCL 10 MG PO TABS: 10.0000 mg | ORAL_TABLET | Freq: Every day | ORAL | 3 refills | Status: DC

## 2021-10-07 MED ORDER — DOXYCYCLINE HYCLATE 100 MG PO TABS: 100.0000 mg | ORAL_TABLET | Freq: Two times a day (BID) | ORAL | 0 refills | Status: DC

## 2021-10-07 NOTE — Progress Notes (Signed)
Subjective:    Patient ID: Matthew Savage, male    DOB: 1960/10/12, 61 y.o.   MRN: [id number]  HPI 61 year old male who  has a past medical history of Blood in urine, Class 2 obesity (09/30/2020), DDD (degenerative disc disease), DVT (deep venous thrombosis) ([place]) (2017 & 2018), Gout, Heart murmur, Hyperlipidemia, Hypertension, Numbness and tingling, and SCC (squamous cell carcinoma).  He presents to the office today for multiple complaints.  His first complaint is that of an abscess on his left lower back that has been present for the last week or 2 and is slowly been getting larger.  Over the weekend he did have copious amounts of purulent drainage and blood.  Does report pain with palpation.  Has not had any fevers or chills  He also reports that he had a possible infection from a splinter to his right index finger.  He has not been able to remove the splinter but is unsure of the exact cause.  Redness, warmth, swelling, discomfort has improved over the last 24 hours  Finally he also reports low back pain from work.  Pain feels as though it is a muscle strain.  Pain is worse with bending and twisting motions.   Review of Systems See HPI   Past Medical History:  Diagnosis Date   Blood in urine    Class 2 obesity 09/30/2020   DDD (degenerative disc disease)    DVT (deep venous thrombosis) ([place]) 2017 & 2018   Gout    Heart murmur    Hyperlipidemia    Hypertension    Numbness and tingling    right arm and hand   SCC (squamous cell carcinoma)    skin, squamous cell, face    Social History   Socioeconomic History   Marital status: Single    Spouse name: Not on file   Number of children: Not on file   Years of education: Not on file   Highest education level: Associate degree: occupational, Hotel manager, or vocational program  Occupational History   Not on file  Tobacco Use   Smoking status: Former    Packs/day: 2.00    Years: 36.00    Total pack years: 72.00    Types:  Cigarettes    Quit date: 01/01/2020    Years since quitting: 1.7   Smokeless tobacco: Never  Substance and Sexual Activity   Alcohol use: Yes    Alcohol/week: 12.0 standard drinks of alcohol    Types: 12 Standard drinks or equivalent per week    Comment: 2 per day   Drug use: No   Sexual activity: Not on file  Other Topics Concern   Not on file  Social History Narrative   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    Married    No kids   Social Determinants of Health   Financial Resource Strain: Low Risk  (06/30/2021)   Overall Financial Resource Strain (CARDIA)    Difficulty of Paying Living Expenses: Not hard at all  Food Insecurity: No Food Insecurity (06/30/2021)   Hunger Vital Sign    Worried About Running Out of Food in the Last Year: Never true    Ran Out of Food in the Last Year: Never true  Transportation Needs: No Transportation Needs (06/30/2021)   PRAPARE - Hydrologist (Medical): No    Lack of Transportation (Non-Medical): No  Physical Activity: Sufficiently Active (06/30/2021)   Exercise Vital Sign  Days of Exercise per Week: 3 days    Minutes of Exercise per Session: 60 min  Stress: No Stress Concern Present (06/30/2021)   [place]    Feeling of Stress : Not at all  Social Connections: Socially Isolated (06/30/2021)   Social Connection and Isolation Panel [NHANES]    Frequency of Communication with Friends and Family: Three times a week    Frequency of Social Gatherings with Friends and Family: Twice a week    Attends Religious Services: Never    Marine scientist or Organizations: No    Attends Music therapist: Not on file    Marital Status: Divorced  Intimate Partner Violence: Not on file    Past Surgical History:  Procedure Laterality Date   A-FLUTTER ABLATION N/A 03/11/2020   Procedure: A-FLUTTER ABLATION;  Surgeon: Deboraha Sprang, MD;   Location: [facility];  Service: Cardiovascular;  Laterality: N/A;   APPLICATION OF WOUND VAC  10/18/2020   Procedure: APPLICATION OF WOUND VAC;  Surgeon: Lajuana Matte, MD;  Location: [facility];  Service: Thoracic;;   ASD REPAIR N/A 10/03/2020   Procedure: ATRIAL SEPTAL DEFECT (ASD) REPAIR WITH PERI GUARD PERICARDIUM;  Surgeon: Wonda Olds, MD;  Location: [facility];  Service: Open Heart Surgery;  Laterality: N/A;   CLIPPING OF ATRIAL APPENDAGE Left 10/03/2020   Procedure: CLIPPING OF ATRIAL APPENDAGE WITH ATRICURE 58 CLIP;  Surgeon: Wonda Olds, MD;  Location: [facility];  Service: Open Heart Surgery;  Laterality: Left;   CORONARY ARTERY BYPASS GRAFT N/A 10/03/2020   Procedure: CORONARY ARTERY BYPASS GRAFTING (CABG) X FOUR, USING BILATERAL MAMMARY ARTERIES AND RIGHT ENDOSCOPIC GREATER SAPHENOUS VEIN CONDUITS;  Surgeon: Wonda Olds, MD;  Location: [facility];  Service: Open Heart Surgery;  Laterality: N/A;   HERNIA REPAIR  1967   X 2   IR THORACENTESIS ASP PLEURAL SPACE W/IMG GUIDE  10/28/2020   LAPAROSCOPIC GASTRIC BANDING  03/2009   in [place], [place] CATH AND CORONARY ANGIOGRAPHY N/A 10/01/2020   Procedure: LEFT HEART CATH AND CORONARY ANGIOGRAPHY;  Surgeon: Troy Sine, MD;  Location: [facility];  Service: Cardiovascular;  Laterality: N/A;   MASS EXCISION  12/03/2011   X 2 (gluteal) X 1 (lower back)   MOHS SURGERY     x 2   STERNAL WOUND DEBRIDEMENT N/A 10/18/2020   Procedure: STERNAL WOUND DEBRIDEMENT;  Surgeon: Lajuana Matte, MD;  Location: [facility];  Service: Thoracic;  Laterality: N/A;   STERNAL WOUND DEBRIDEMENT N/A 10/23/2020   Procedure: WOUND VAC CHANGE with APPLICATION OF MYRIAD MORCELLS;  Surgeon: Wonda Olds, MD;  Location: [facility];  Service: Thoracic;  Laterality: N/A;   TEE WITHOUT CARDIOVERSION N/A 10/03/2020   Procedure: TRANSESOPHAGEAL ECHOCARDIOGRAM (TEE);  Surgeon: Wonda Olds, MD;  Location: [facility];  Service: Open Heart Surgery;   Laterality: N/A;    Family History  Problem Relation Age of Onset   Breast cancer Mother    Other Mother        small cell carcinoma   Hypertension Father    Colon polyps Father    Colon polyps Brother     Allergies  Allergen Reactions   Levaquin [Levofloxacin]     Aortic Aneurysm    Current Outpatient Medications on File Prior to Visit  Medication Sig Dispense Refill   acetaminophen (TYLENOL) 325 MG tablet Take 2 tablets (650 mg total)  by mouth every 6 (six) hours as needed for fever.     albuterol (VENTOLIN HFA) 108 (90 Base) MCG/ACT inhaler Inhale 2 puffs into the lungs every 6 (six) hours as needed for wheezing or shortness of breath. 8 g 2   allopurinol (ZYLOPRIM) 300 MG tablet TAKE 1 TABLET BY MOUTH EVERY DAY 90 tablet 0   amiodarone (PACERONE) 200 MG tablet TAKE 1 TABLET BY MOUTH EVERY DAY 90 tablet 3   atorvastatin (LIPITOR) 80 MG tablet Take 1 tablet by mouth daily.     cetirizine (ZYRTEC) 10 MG tablet Take 10 mg by mouth at bedtime.     colchicine 0.6 MG tablet Take 0.6 mg by mouth daily as needed (Flair up gout).      FARXIGA 10 MG TABS tablet TAKE 1 TABLET BY MOUTH DAILY BEFORE BREAKFAST. 90 tablet 3   ferrous [id number] C-folic acid (TRINSICON / FOLTRIN) capsule Take 1 capsule by mouth daily.     Fluticasone-Umeclidin-Vilant (TRELEGY ELLIPTA) 200-62.5-25 MCG/ACT AEPB Inhale 1 puff into the lungs daily. 60 each 0   metoprolol succinate (TOPROL-XL) 25 MG 24 hr tablet TAKE 1 TABLET BY MOUTH EVERY DAY WITH OR IMMEDIATELY FOLLOWING A MEAL 90 tablet 3   oxyCODONE (OXY IR/ROXICODONE) 5 MG immediate release tablet Take 1 tablet (5 mg total) by mouth every 6 (six) hours as needed for severe pain. 24 tablet 0   torsemide (DEMADEX) 20 MG tablet Take 1 tablet (20 mg total) by mouth daily. 90 tablet 3   valACYclovir (VALTREX) 500 MG tablet Take 500 mg by mouth daily.     XARELTO 20 MG TABS tablet TAKE 1 TABLET BY MOUTH DAILY WITH SUPPER. 30 tablet 0   prednisoLONE  acetate (PRED FORTE) 1 % ophthalmic suspension Place 1 drop into the left eye 2 (two) times daily.     No current facility-administered medications on file prior to visit.    BP 110/80   Pulse 80   Temp 98.8 F (37.1 C) (Oral)   Ht '5\' 11"'  (1.803 m)   Wt (!) 319 lb (144.7 kg)   SpO2 98%   BMI 44.49 kg/m       Objective:   Physical Exam Vitals and nursing note reviewed.  Constitutional:      Appearance: Normal appearance.  Musculoskeletal:     Lumbar back: Spasms and tenderness present. No bony tenderness. Decreased range of motion.  Skin:    General: Skin is warm and dry.     Capillary Refill: Capillary refill takes less than 2 seconds.       Neurological:     General: No focal deficit present.     Mental Status: He is alert and oriented to person, place, and time.  Psychiatric:        Mood and Affect: Mood normal.        Behavior: Behavior normal.        Thought Content: Thought content normal.        Judgment: Judgment normal.       Assessment & Plan:  1. Abscess Procedure:  Incision and drainage of abscess Risks, benefits, and alternatives explained and consent obtained. Time out conducted. Surface cleaned with alcohol. 2 cc lidocaine with epinephine infiltrated around abscess. Adequate anesthesia ensured. Area prepped and draped in a sterile fashion. #11 blade used to make a stab incision into abscess. Pus expressed with pressure. Curved hemostat used to explore 4 quadrants and loculations broken up. Further purulence expressed.  Approximately 3 inches of iodoform  packing placed leaving a 1-inch tail. Hemostasis achieved. Pt stable. Aftercare and follow-up advised.  - doxycycline (VIBRA-TABS) 100 MG tablet; Take 1 tablet (100 mg total) by mouth 2 (two) times daily.  Dispense: 20 tablet; Refill: 0 - Wound check next week.   2. Infection of finger - no foreign body seen. Looks like localized infection. Not paronychia. Will cover with antibiotics for  abscess.  - doxycycline (VIBRA-TABS) 100 MG tablet; Take 1 tablet (100 mg total) by mouth 2 (two) times daily.  Dispense: 20 tablet; Refill: 0  3. Acute midline low back pain without sciatica  - cyclobenzaprine (FLEXERIL) 10 MG tablet; Take 1 tablet (10 mg total) by mouth at bedtime.  Dispense: 30 tablet; Refill: 3  Dorothyann Peng, NP

## 2021-10-17 ENCOUNTER — Ambulatory Visit (INDEPENDENT_AMBULATORY_CARE_PROVIDER_SITE_OTHER): Payer: BC Managed Care – PPO | Admitting: Adult Health

## 2021-10-17 DIAGNOSIS — L0291 Cutaneous abscess, unspecified: Secondary | ICD-10-CM

## 2021-10-17 MED ORDER — DOXYCYCLINE HYCLATE 100 MG PO TABS: 100.0000 mg | ORAL_TABLET | Freq: Two times a day (BID) | ORAL | 0 refills | Status: AC

## 2021-10-17 NOTE — Progress Notes (Signed)
Subjective:    Patient ID: Matthew Savage, male    DOB: 06-Apr-1961, 61 y.o.   MRN: [id number]  HPI 61 year old male who  has a past medical history of Blood in urine, Class 2 obesity (09/30/2020), DDD (degenerative disc disease), DVT (deep venous thrombosis) ([place]) (2017 & 2018), Gout, Heart murmur, Hyperlipidemia, Hypertension, Numbness and tingling, and SCC (squamous cell carcinoma).  He presents to the office today for 10-day follow-up regarding an abscess on his right lower back.  When he was last seen we did incision and drainage of the abscess and he was placed on 10 days of doxycycline.  Takes his last pill tonight.  He has not had any drainage from the abscess.  Has not experienced any tenderness or felt any redness or warmth.  He has responded well to the antibiotic   Review of Systems See HPI   Past Medical History:  Diagnosis Date   Blood in urine    Class 2 obesity 09/30/2020   DDD (degenerative disc disease)    DVT (deep venous thrombosis) ([place]) 2017 & 2018   Gout    Heart murmur    Hyperlipidemia    Hypertension    Numbness and tingling    right arm and hand   SCC (squamous cell carcinoma)    skin, squamous cell, face    Social History   Socioeconomic History   Marital status: Single    Spouse name: Not on file   Number of children: Not on file   Years of education: Not on file   Highest education level: Associate degree: occupational, Hotel manager, or vocational program  Occupational History   Not on file  Tobacco Use   Smoking status: Former    Packs/day: 2.00    Years: 36.00    Total pack years: 72.00    Types: Cigarettes    Quit date: 01/01/2020    Years since quitting: 1.7   Smokeless tobacco: Never  Substance and Sexual Activity   Alcohol use: Yes    Alcohol/week: 12.0 standard drinks of alcohol    Types: 12 Standard drinks or equivalent per week    Comment: 2 per day   Drug use: No   Sexual activity: Not on file  Other Topics Concern   Not on  file  Social History Narrative   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    Married    No kids   Social Determinants of Health   Financial Resource Strain: Low Risk  (06/30/2021)   Overall Financial Resource Strain (CARDIA)    Difficulty of Paying Living Expenses: Not hard at all  Food Insecurity: No Food Insecurity (06/30/2021)   Hunger Vital Sign    Worried About Running Out of Food in the Last Year: Never true    Ran Out of Food in the Last Year: Never true  Transportation Needs: No Transportation Needs (06/30/2021)   PRAPARE - Hydrologist (Medical): No    Lack of Transportation (Non-Medical): No  Physical Activity: Sufficiently Active (06/30/2021)   Exercise Vital Sign    Days of Exercise per Week: 3 days    Minutes of Exercise per Session: 60 min  Stress: No Stress Concern Present (06/30/2021)   [place]    Feeling of Stress : Not at all  Social Connections: Socially Isolated (06/30/2021)   Social Connection and Isolation Panel [NHANES]    Frequency of Communication  with Friends and Family: Three times a week    Frequency of Social Gatherings with Friends and Family: Twice a week    Attends Religious Services: Never    Marine scientist or Organizations: No    Attends Music therapist: Not on file    Marital Status: Divorced  Intimate Partner Violence: Not on file    Past Surgical History:  Procedure Laterality Date   A-FLUTTER ABLATION N/A 03/11/2020   Procedure: A-FLUTTER ABLATION;  Surgeon: Deboraha Sprang, MD;  Location: [facility];  Service: Cardiovascular;  Laterality: N/A;   APPLICATION OF WOUND VAC  10/18/2020   Procedure: APPLICATION OF WOUND VAC;  Surgeon: Lajuana Matte, MD;  Location: [facility];  Service: Thoracic;;   ASD REPAIR N/A 10/03/2020   Procedure: ATRIAL SEPTAL DEFECT (ASD) REPAIR WITH PERI GUARD PERICARDIUM;  Surgeon: Wonda Olds, MD;  Location: [facility];  Service: Open Heart Surgery;  Laterality: N/A;   CLIPPING OF ATRIAL APPENDAGE Left 10/03/2020   Procedure: CLIPPING OF ATRIAL APPENDAGE WITH ATRICURE 58 CLIP;  Surgeon: Wonda Olds, MD;  Location: [facility];  Service: Open Heart Surgery;  Laterality: Left;   CORONARY ARTERY BYPASS GRAFT N/A 10/03/2020   Procedure: CORONARY ARTERY BYPASS GRAFTING (CABG) X FOUR, USING BILATERAL MAMMARY ARTERIES AND RIGHT ENDOSCOPIC GREATER SAPHENOUS VEIN CONDUITS;  Surgeon: Wonda Olds, MD;  Location: [facility];  Service: Open Heart Surgery;  Laterality: N/A;   HERNIA REPAIR  1967   X 2   IR THORACENTESIS ASP PLEURAL SPACE W/IMG GUIDE  10/28/2020   LAPAROSCOPIC GASTRIC BANDING  03/2009   in [place], [place] CATH AND CORONARY ANGIOGRAPHY N/A 10/01/2020   Procedure: LEFT HEART CATH AND CORONARY ANGIOGRAPHY;  Surgeon: Troy Sine, MD;  Location: [facility];  Service: Cardiovascular;  Laterality: N/A;   MASS EXCISION  12/03/2011   X 2 (gluteal) X 1 (lower back)   MOHS SURGERY     x 2   STERNAL WOUND DEBRIDEMENT N/A 10/18/2020   Procedure: STERNAL WOUND DEBRIDEMENT;  Surgeon: Lajuana Matte, MD;  Location: [facility];  Service: Thoracic;  Laterality: N/A;   STERNAL WOUND DEBRIDEMENT N/A 10/23/2020   Procedure: WOUND VAC CHANGE with APPLICATION OF MYRIAD MORCELLS;  Surgeon: Wonda Olds, MD;  Location: [facility];  Service: Thoracic;  Laterality: N/A;   TEE WITHOUT CARDIOVERSION N/A 10/03/2020   Procedure: TRANSESOPHAGEAL ECHOCARDIOGRAM (TEE);  Surgeon: Wonda Olds, MD;  Location: [facility];  Service: Open Heart Surgery;  Laterality: N/A;    Family History  Problem Relation Age of Onset   Breast cancer Mother    Other Mother        small cell carcinoma   Hypertension Father    Colon polyps Father    Colon polyps Brother     Allergies  Allergen Reactions   Levaquin [Levofloxacin]     Aortic Aneurysm    Current Outpatient Medications on File Prior to  Visit  Medication Sig Dispense Refill   acetaminophen (TYLENOL) 325 MG tablet Take 2 tablets (650 mg total) by mouth every 6 (six) hours as needed for fever.     albuterol (VENTOLIN HFA) 108 (90 Base) MCG/ACT inhaler Inhale 2 puffs into the lungs every 6 (six) hours as needed for wheezing or shortness of breath. 8 g 2   allopurinol (ZYLOPRIM) 300 MG tablet TAKE 1 TABLET BY MOUTH EVERY DAY 90 tablet 0   amiodarone (PACERONE) 200  MG tablet TAKE 1 TABLET BY MOUTH EVERY DAY 90 tablet 3   atorvastatin (LIPITOR) 80 MG tablet Take 1 tablet by mouth daily.     cetirizine (ZYRTEC) 10 MG tablet Take 10 mg by mouth at bedtime.     colchicine 0.6 MG tablet Take 0.6 mg by mouth daily as needed (Flair up gout).      cyclobenzaprine (FLEXERIL) 10 MG tablet Take 1 tablet (10 mg total) by mouth at bedtime. 30 tablet 3   FARXIGA 10 MG TABS tablet TAKE 1 TABLET BY MOUTH DAILY BEFORE BREAKFAST. 90 tablet 3   ferrous [id number] C-folic acid (TRINSICON / FOLTRIN) capsule Take 1 capsule by mouth daily.     Fluticasone-Umeclidin-Vilant (TRELEGY ELLIPTA) 200-62.5-25 MCG/ACT AEPB Inhale 1 puff into the lungs daily. 60 each 0   metoprolol succinate (TOPROL-XL) 25 MG 24 hr tablet TAKE 1 TABLET BY MOUTH EVERY DAY WITH OR IMMEDIATELY FOLLOWING A MEAL 90 tablet 3   oxyCODONE (OXY IR/ROXICODONE) 5 MG immediate release tablet Take 1 tablet (5 mg total) by mouth every 6 (six) hours as needed for severe pain. 24 tablet 0   prednisoLONE acetate (PRED FORTE) 1 % ophthalmic suspension Place 1 drop into the left eye 2 (two) times daily.     torsemide (DEMADEX) 20 MG tablet Take 1 tablet (20 mg total) by mouth daily. 90 tablet 3   valACYclovir (VALTREX) 500 MG tablet Take 500 mg by mouth daily.     XARELTO 20 MG TABS tablet TAKE 1 TABLET BY MOUTH DAILY WITH SUPPER. 30 tablet 0   No current facility-administered medications on file prior to visit.    BP 120/64   Pulse 64   Temp 98.8 F (37.1 C) (Oral)   Ht _0  (1.803  m)   Wt (!) 319 lb (144.7 kg)   SpO2 98%   BMI 44.49 kg/m       Objective:   Physical Exam Vitals and nursing note reviewed.  Constitutional:      Appearance: Normal appearance.  Skin:    General: Skin is warm and dry.     Capillary Refill: Capillary refill takes less than 2 seconds.     Findings: Erythema present.     Comments: Abscess does seem to be healing well.  He does have a small quarter sized area is nonfluctuant still noted in the abscess.  Neurological:     General: No focal deficit present.     Mental Status: He is alert and oriented to person, place, and time.  Psychiatric:        Mood and Affect: Mood normal.        Behavior: Behavior normal.        Thought Content: Thought content normal.       Assessment & Plan:  1. Abscess -We will keep him on doxycycline 100 mg twice daily for the next 7 days. - doxycycline (VIBRA-TABS) 100 MG tablet; Take 1 tablet (100 mg total) by mouth 2 (two) times daily for 7 days.  Dispense: 14 tablet; Refill: 0  Dorothyann Peng, NP

## 2021-11-10 ENCOUNTER — Ambulatory Visit: Payer: BC Managed Care – PPO | Admitting: Internal Medicine

## 2021-11-19 ENCOUNTER — Ambulatory Visit: Payer: BC Managed Care – PPO | Admitting: Pulmonary Disease

## 2021-11-19 ENCOUNTER — Encounter: Payer: Self-pay | Admitting: Internal Medicine

## 2021-11-19 ENCOUNTER — Ambulatory Visit (INDEPENDENT_AMBULATORY_CARE_PROVIDER_SITE_OTHER): Payer: BC Managed Care – PPO | Admitting: Internal Medicine

## 2021-11-19 VITALS — BP 124/80 | HR 71 | Ht 72.0 in | Wt 322.0 lb

## 2021-11-19 DIAGNOSIS — I5020 Unspecified systolic (congestive) heart failure: Secondary | ICD-10-CM | POA: Diagnosis not present

## 2021-11-19 DIAGNOSIS — Z951 Presence of aortocoronary bypass graft: Secondary | ICD-10-CM | POA: Diagnosis not present

## 2021-11-19 DIAGNOSIS — I10 Essential (primary) hypertension: Secondary | ICD-10-CM | POA: Diagnosis not present

## 2021-11-19 DIAGNOSIS — I483 Typical atrial flutter: Secondary | ICD-10-CM

## 2021-11-19 DIAGNOSIS — I359 Nonrheumatic aortic valve disorder, unspecified: Secondary | ICD-10-CM

## 2021-11-19 MED ORDER — POTASSIUM CHLORIDE CRYS ER 20 MEQ PO TBCR: 20.0000 meq | EXTENDED_RELEASE_TABLET | Freq: Every day | ORAL | 3 refills | Status: DC

## 2021-11-19 MED ORDER — TORSEMIDE 20 MG PO TABS: 40.0000 mg | ORAL_TABLET | Freq: Every day | ORAL | 3 refills | Status: DC

## 2021-11-19 NOTE — Progress Notes (Signed)
Cardiology Office Note:    Date:  11/19/2021   ID:  Matthew Savage, DOB [birth year], MRN [id number]  PCP:  Dorothyann Peng, NP  [facility] Cardiologist:  Werner Lean, MD  [facility] Electrophysiologist:  Virl Axe, MD   Referring MD: Dorothyann Peng, NP   XN:[id number] up AF  History of Present Illness:    Matthew Savage is a 61 y.o. male with a hx of morbid obesity s/p lap Band atrial fibrillation (paroxsymal) atrial flutter (first diagnoses in [place]), essential hypertension, mild aortic stenosis, hyperlipidemia, and tobacco use who established at [facility] 12/28/19.  Had AF and was s/p ablation.   2022; Had NSTEMI 09/2020.  Found to have multi-vessel CAD, ASD repair, and LAA clip.  Complicated by ileus and post operative flutter. And HFrEF.  Then sternal wound and bacteremia.  Then AKI.  Returned to [place] to rehab with family.  Complete CR.  Started back to work.  2023: Found to have COPD on amio screening PFTs and vertiginous symptoms. 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  No chest pain.  Patient notes that he is doing ok.   Balance is off.  Deaf in left ear  No chest pain or pressure.  No SOB/DOE and no PND/Orthopnea.  Slight weigh gain and increase left leg swelling.  No palpitations or syncope.   Past Medical History:  Diagnosis Date   Blood in urine    Class 2 obesity 09/30/2020   DDD (degenerative disc disease)    DVT (deep venous thrombosis) ([place]) 2017 & 2018   Gout    Heart murmur    Hyperlipidemia    Hypertension    Numbness and tingling    right arm and hand   SCC (squamous cell carcinoma)    skin, squamous cell, face    Past Surgical History:  Procedure Laterality Date   A-FLUTTER ABLATION N/A 03/11/2020   Procedure: A-FLUTTER ABLATION;  Surgeon: Deboraha Sprang, MD;  Location: [place] [facility];  Service: Cardiovascular;  Laterality: N/A;   APPLICATION OF WOUND VAC  10/18/2020   Procedure: APPLICATION OF WOUND VAC;  Surgeon: Lajuana Matte, MD;  Location: [place];  Service: Thoracic;;   ASD REPAIR N/A 10/03/2020   Procedure: ATRIAL SEPTAL DEFECT (ASD) REPAIR WITH PERI GUARD PERICARDIUM;  Surgeon: Wonda Olds, MD;  Location: [place];  Service: Open Heart Surgery;  Laterality: N/A;   CLIPPING OF ATRIAL APPENDAGE Left 10/03/2020   Procedure: CLIPPING OF ATRIAL APPENDAGE WITH ATRICURE 84 CLIP;  Surgeon: Wonda Olds, MD;  Location: [place];  Service: Open Heart Surgery;  Laterality: Left;   CORONARY ARTERY BYPASS GRAFT N/A 10/03/2020   Procedure: CORONARY ARTERY BYPASS GRAFTING (CABG) X FOUR, USING BILATERAL MAMMARY ARTERIES AND RIGHT ENDOSCOPIC GREATER SAPHENOUS VEIN CONDUITS;  Surgeon: Wonda Olds, MD;  Location: [place];  Service: Open Heart Surgery;  Laterality: N/A;   HERNIA REPAIR  1967   X 2   IR THORACENTESIS ASP PLEURAL SPACE W/IMG GUIDE  10/28/2020   LAPAROSCOPIC GASTRIC BANDING  03/2009   in [place], [place] CATH AND CORONARY ANGIOGRAPHY N/A 10/01/2020   Procedure: LEFT HEART CATH AND CORONARY ANGIOGRAPHY;  Surgeon: Troy Sine, MD;  Location: [place] [facility];  Service: Cardiovascular;  Laterality: N/A;   MASS EXCISION  12/03/2011   X 2 (gluteal) X 1 (lower back)   MOHS SURGERY     x 2   STERNAL WOUND DEBRIDEMENT N/A 10/18/2020  Procedure: STERNAL WOUND DEBRIDEMENT;  Surgeon: Lajuana Matte, MD;  Location: [place];  Service: Thoracic;  Laterality: N/A;   STERNAL WOUND DEBRIDEMENT N/A 10/23/2020   Procedure: WOUND VAC CHANGE with APPLICATION OF MYRIAD MORCELLS;  Surgeon: Wonda Olds, MD;  Location: [place];  Service: Thoracic;  Laterality: N/A;   TEE WITHOUT CARDIOVERSION N/A 10/03/2020   Procedure: TRANSESOPHAGEAL ECHOCARDIOGRAM (TEE);  Surgeon: Wonda Olds, MD;  Location: [place];  Service: Open Heart Surgery;  Laterality: N/A;   Current Medications: Current Meds  Medication Sig   acetaminophen (TYLENOL) 325 MG tablet Take 2 tablets (650 mg total) by mouth every 6 (six) hours as  needed for fever.   albuterol (VENTOLIN HFA) 108 (90 Base) MCG/ACT inhaler Inhale 2 puffs into the lungs every 6 (six) hours as needed for wheezing or shortness of breath.   allopurinol (ZYLOPRIM) 300 MG tablet TAKE 1 TABLET BY MOUTH EVERY DAY   amiodarone (PACERONE) 200 MG tablet TAKE 1 TABLET BY MOUTH EVERY DAY   atorvastatin (LIPITOR) 80 MG tablet Take 1 tablet by mouth daily.   cetirizine (ZYRTEC) 10 MG tablet Take 10 mg by mouth at bedtime.   colchicine 0.6 MG tablet Take 0.6 mg by mouth daily as needed (Flair up gout).    cyclobenzaprine (FLEXERIL) 10 MG tablet Take 1 tablet (10 mg total) by mouth at bedtime.   FARXIGA 10 MG TABS tablet TAKE 1 TABLET BY MOUTH DAILY BEFORE BREAKFAST.   ferrous [id number] C-folic acid (TRINSICON / FOLTRIN) capsule Take 1 capsule by mouth daily.   Fluticasone-Umeclidin-Vilant (TRELEGY ELLIPTA) 200-62.5-25 MCG/ACT AEPB Inhale 1 puff into the lungs daily.   metoprolol succinate (TOPROL-XL) 25 MG 24 hr tablet TAKE 1 TABLET BY MOUTH EVERY DAY WITH OR IMMEDIATELY FOLLOWING A MEAL   oxyCODONE (OXY IR/ROXICODONE) 5 MG immediate release tablet Take 1 tablet (5 mg total) by mouth every 6 (six) hours as needed for severe pain.   potassium chloride SA (KLOR-CON M) 20 MEQ tablet Take 1 tablet (20 mEq total) by mouth daily.   prednisoLONE acetate (PRED FORTE) 1 % ophthalmic suspension Place 1 drop into the left eye 2 (two) times daily.   torsemide (DEMADEX) 20 MG tablet Take 2 tablets (40 mg total) by mouth daily.   valACYclovir (VALTREX) 500 MG tablet Take 500 mg by mouth daily.   XARELTO 20 MG TABS tablet TAKE 1 TABLET BY MOUTH DAILY WITH SUPPER.   [DISCONTINUED] torsemide (DEMADEX) 20 MG tablet Take 1 tablet (20 mg total) by mouth daily.     Allergies:   Levaquin [levofloxacin]   Social History   Socioeconomic History   Marital status: Single    Spouse name: Not on file   Number of children: Not on file   Years of education: Not on file   Highest  education level: Associate degree: occupational, Hotel manager, or vocational program  Occupational History   Not on file  Tobacco Use   Smoking status: Former    Packs/day: 2.00    Years: 36.00    Total pack years: 72.00    Types: Cigarettes    Quit date: 01/01/2020    Years since quitting: 1.8   Smokeless tobacco: Never  Substance and Sexual Activity   Alcohol use: Yes    Alcohol/week: 12.0 standard drinks of alcohol    Types: 12 Standard drinks or equivalent per week    Comment: 2 per day   Drug use: No   Sexual activity: Not on file  Other Topics  Concern   Not on file  Social History Narrative   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    Married    No kids   Social Determinants of Health   Financial Resource Strain: Low Risk  (06/30/2021)   Overall Financial Resource Strain (CARDIA)    Difficulty of Paying Living Expenses: Not hard at all  Food Insecurity: No Food Insecurity (06/30/2021)   Hunger Vital Sign    Worried About Running Out of Food in the Last Year: Never true    Ran Out of Food in the Last Year: Never true  Transportation Needs: No Transportation Needs (06/30/2021)   PRAPARE - Hydrologist (Medical): No    Lack of Transportation (Non-Medical): No  Physical Activity: Sufficiently Active (06/30/2021)   Exercise Vital Sign    Days of Exercise per Week: 3 days    Minutes of Exercise per Session: 60 min  Stress: No Stress Concern Present (06/30/2021)   [place]    Feeling of Stress : Not at all  Social Connections: Socially Isolated (06/30/2021)   Social Connection and Isolation Panel [NHANES]    Frequency of Communication with Friends and Family: Three times a week    Frequency of Social Gatherings with Friends and Family: Twice a week    Attends Religious Services: Never    Marine scientist or Organizations: No    Attends Music therapist: Not on  file    Marital Status: Divorced    Social: From [place], his father passed away in late 06/04/20 of PNA and COPD  Family History: The patient's family history includes Breast cancer in his mother; Colon polyps in his brother and father; Hypertension in his father; Other in his mother. Cousin has PE and died.  No hx of Bicuspid Valve in Family.  No hx of SCD.   ROS:   Please see the history of present illness.    All other systems reviewed and are negative.  EKGs/Labs/Other Studies Reviewed:    The following studies were reviewed today:  EKG:   11/19/21: AFL with ashman beats rate 72 02/29/20 atrial flutter with ventricular rate of 94 and Ashman beats vs occasional PVCs 12/22/19 EKG notable for atrial flutter rate 96  Transthoracic Echocardiogram: Date:07/08/20 Results: 1. Left ventricular ejection fraction, by estimation, is 50 to 55%. The  left ventricle has low normal function. The left ventricle demonstrates  regional wall motion abnormalities (see scoring diagram/findings for  description). There is moderate  concentric left ventricular hypertrophy. Left ventricular diastolic  parameters are consistent with Grade II diastolic dysfunction  (pseudonormalization). Elevated left ventricular end-diastolic pressure.  There is hypokinesis of the left ventricular,  entire anterior wall and lateral wall.   2. Right ventricular systolic function is normal. The right ventricular  size is normal. Tricuspid regurgitation signal is inadequate for assessing  PA pressure.   3. Left atrial size was mild to moderately dilated.   4. Right atrial size was mildly dilated.   5. The mitral valve is normal in structure. Trivial mitral valve  regurgitation. No evidence of mitral stenosis.   6. The aortic valve is tricuspid. There is moderate calcification of the  aortic valve. There is moderate thickening of the aortic valve. Aortic  valve regurgitation is not visualized. Mild aortic valve stenosis.    7. Aortic dilatation noted. There is mild dilatation of the ascending  aorta, measuring 41 mm.  8. The inferior vena cava is normal in size with greater than 50%  respiratory variability, suggesting right atrial pressure of 3 mmHg.   Recent Labs: 04/16/2021: Hemoglobin 12.3; Platelets 264 07/01/2021: ALT 30; NT-Pro BNP 1,036; TSH 1.560 07/18/2021: BUN 23; Creatinine, Ser 1.67; Potassium 4.4; Sodium 139  Recent Lipid Panel    Component Value Date/Time   CHOL 114 07/08/2020 0847   TRIG 74 07/08/2020 0847   HDL 50 07/08/2020 0847   CHOLHDL 2.3 07/08/2020 0847   CHOLHDL 4 06/21/2019 0729   VLDL 17.0 06/21/2019 0729   LDLCALC 49 07/08/2020 0847   LDLDIRECT 135.1 04/06/2011 0835    Physical Exam:    VS:  BP 124/80   Pulse 71   Ht 6' (1.829 m)   Wt (!) 322 lb (146.1 kg)   SpO2 97%   BMI 43.67 kg/m     Wt Readings from Last 3 Encounters:  11/19/21 (!) 322 lb (146.1 kg)  10/17/21 (!) 319 lb (144.7 kg)  10/07/21 (!) 319 lb (144.7 kg)    Gen: No distress, morbid obesity Neck: No JVD Cardiac: No Rubs or Gallops, systolic  murmur, IRIR +2 radial pulses Respiratory: Clear to auscultation bilaterally, normal effort, normal  respiratory rate GI: Soft, nontender, non-distended  MS: LE edema L > R;  moves all extremities Integument: Skin feels warm Neuro:  At time of evaluation, alert and oriented to person/place/time/situation; Deaf in R ear Psych: Normal affect, patient feels OK  ASSESSMENT:    1. HFrEF (heart failure with reduced ejection fraction) ([place])   2. Essential hypertension   3. S/P CABG (coronary artery bypass graft)   4. Aortic valve disorder   5. Morbid obesity ([place])   6. Typical atrial flutter (H[facility])     PLAN:    CAD s/p CABG Mild Aortic Ectasia 41 mm HLD Mild-Mod AS - continue DOAC for AF - continue atorvastatin 80 mg - Last Aortic Gradients  26 mm Hg in 2022 - Repeat Echo in early 2024  Heart Failure mildly reduced Ejection Fraction  Morbid  Obesity HTN with DM CKD stage IIIa - NYHA class I, Stage B, hypervolemic, etiology from AF - Diuretic regimen: Torsemide increase to 40 mg with  20 meq K, repeat BMP and BNP 12/03/21 - metoprolol succinate 20 mg - based on results stop K and start MRA  Long standing persistent Atrial Flutter, Atrial Fibrillation  - CHADSVASC 4 (HTN with DM, CAD, CHF) Hx of DVT on Xarelto OSA - on CPAP - following with Dr. Caryl Comes - on amiodarone 200 mg Daily, will recheck safety labs/tests in 2024 - at next visit if his non cardiac issues stabilize  could discuss repeat ablation  COPD Former Tobacco Use - with continued cessation - sees Pulm  Early 2024 follow up   Medication Adjustments/Labs and Tests Ordered: Current medicines are reviewed at length with the patient today.  Concerns regarding medicines are outlined above.  Orders Placed This Encounter  Procedures   Basic metabolic panel   Pro b natriuretic peptide (BNP)   EKG 12-Lead   Meds ordered this encounter  Medications   torsemide (DEMADEX) 20 MG tablet    Sig: Take 2 tablets (40 mg total) by mouth daily.    Dispense:  180 tablet    Refill:  3   potassium chloride SA (KLOR-CON M) 20 MEQ tablet    Sig: Take 1 tablet (20 mEq total) by mouth daily.    Dispense:  90 tablet    Refill:  3    Patient Instructions  Medication Instructions:  Your physician has recommended you make the following change in your medication:  INCREASE: Torsemide to 40 mg by mouth daily   START: Potassium Chloride 20 mEq by mouth daily  *If you need a refill on your cardiac medications before your next appointment, please call your pharmacy*   Lab Work: ON Aug 16: BNP, BMP If you have labs (blood work) drawn today and your tests are completely normal, you will receive your results only by: [place] (if you have MyChart) OR A paper copy in the mail If you have any lab test that is abnormal or we need to change your treatment, we will call you  to review the results.   Testing/Procedures: NONE   Follow-Up: At [facility], you and your health needs are our priority.  As part of our continuing mission to provide you with exceptional heart care, we have created designated Provider Care Teams.  These Care Teams include your primary Cardiologist (physician) and Advanced Practice Providers (APPs -  Physician Assistants and Nurse Practitioners) who all work together to provide you with the care you need, when you need it.  Your next appointment:   5 month(s)  The format for your next appointment:   In Person  Provider:   Werner Lean, MD    Important Information About Sugar         Signed, Werner Lean, MD  11/19/2021 3:08 PM    [place]

## 2021-11-19 NOTE — Patient Instructions (Signed)
Medication Instructions:  Your physician has recommended you make the following change in your medication:  INCREASE: Torsemide to 40 mg by mouth daily   START: Potassium Chloride 20 mEq by mouth daily  *If you need a refill on your cardiac medications before your next appointment, please call your pharmacy*   Lab Work: ON Aug 16: BNP, BMP If you have labs (blood work) drawn today and your tests are completely normal, you will receive your results only by: [place] (if you have MyChart) OR A paper copy in the mail If you have any lab test that is abnormal or we need to change your treatment, we will call you to review the results.   Testing/Procedures: NONE   Follow-Up: At [facility], you and your health needs are our priority.  As part of our continuing mission to provide you with exceptional heart care, we have created designated Provider Care Teams.  These Care Teams include your primary Cardiologist (physician) and Advanced Practice Providers (APPs -  Physician Assistants and Nurse Practitioners) who all work together to provide you with the care you need, when you need it.  Your next appointment:   5 month(s)  The format for your next appointment:   In Person  Provider:   Werner Lean, MD    Important Information About Sugar

## 2021-12-03 ENCOUNTER — Other Ambulatory Visit: Payer: BC Managed Care – PPO

## 2021-12-03 DIAGNOSIS — I5020 Unspecified systolic (congestive) heart failure: Secondary | ICD-10-CM

## 2021-12-04 LAB — BASIC METABOLIC PANEL
BUN/Creatinine Ratio: 19 (ref 10–24)
BUN: 37 mg/dL — ABNORMAL HIGH (ref 8–27)
CO2: 20 mmol/L (ref 20–29)
Calcium: 9.7 mg/dL (ref 8.6–10.2)
Chloride: 100 mmol/L (ref 96–106)
Creatinine, Ser: 1.95 mg/dL — ABNORMAL HIGH (ref 0.76–1.27)
Glucose: 139 mg/dL — ABNORMAL HIGH (ref 70–99)
Potassium: 4.6 mmol/L (ref 3.5–5.2)
Sodium: 139 mmol/L (ref 134–144)
eGFR: 38 mL/min/{1.73_m2} — ABNORMAL LOW (ref >59)

## 2021-12-04 LAB — PRO B NATRIURETIC PEPTIDE: NT-Pro BNP: 924 pg/mL — ABNORMAL HIGH (ref 0–210)

## 2021-12-10 ENCOUNTER — Encounter: Payer: Self-pay | Admitting: Adult Health

## 2021-12-10 NOTE — Telephone Encounter (Signed)
FYI

## 2021-12-12 ENCOUNTER — Other Ambulatory Visit: Payer: Self-pay | Admitting: Internal Medicine

## 2021-12-16 ENCOUNTER — Other Ambulatory Visit: Payer: Self-pay | Admitting: Internal Medicine

## 2021-12-16 DIAGNOSIS — I10 Essential (primary) hypertension: Secondary | ICD-10-CM

## 2021-12-16 DIAGNOSIS — I4891 Unspecified atrial fibrillation: Secondary | ICD-10-CM

## 2021-12-17 ENCOUNTER — Other Ambulatory Visit: Payer: Self-pay | Admitting: Adult Health

## 2021-12-17 DIAGNOSIS — M109 Gout, unspecified: Secondary | ICD-10-CM

## 2021-12-19 ENCOUNTER — Ambulatory Visit: Payer: BC Managed Care – PPO | Admitting: Internal Medicine

## 2022-01-15 ENCOUNTER — Ambulatory Visit (INDEPENDENT_AMBULATORY_CARE_PROVIDER_SITE_OTHER): Payer: BC Managed Care – PPO | Admitting: Pulmonary Disease

## 2022-01-15 ENCOUNTER — Encounter: Payer: Self-pay | Admitting: Pulmonary Disease

## 2022-01-15 DIAGNOSIS — G4733 Obstructive sleep apnea (adult) (pediatric): Secondary | ICD-10-CM

## 2022-01-15 DIAGNOSIS — J432 Centrilobular emphysema: Secondary | ICD-10-CM | POA: Diagnosis not present

## 2022-01-15 NOTE — Progress Notes (Signed)
   Subjective:    Patient ID: Matthew Savage, male    DOB: 1960/09/09, 61 y.o.   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  He required IV antibiotics for Serratia bacteremia for 2 to 3 months -hospitalized Feb 2023 for vertigo and atrial fibrillation, echo showed improved EF, he underwent vestibular therapy  Last OV 07/2021 > sample of anoro-this did not work for him, also tried Trelegy which also did not help He complains of nocturia, no problems with mask or pressure.  He is compliant with his CPAP and denies sleep pressure or excessive somnolence or snoring through the machine.    Significant tests/ events reviewed   HST 01/2020 severe OSA with AHI 68/ hr 11/2019 CT angio chest [facility]) >> neg PE , mild emphysema   09/2020 Ct chest  Chronic post granulomatous sequelae with bulky calcified subcarinal lymph nodes. Smaller calcified left hilar lymph nodes   PFTs 07/2021 ratio 66, FEV1 56%, FVC 65%, DLCO 83% PFTs 09/2020 ratio 64, FEV1 54%, FVC 64% PFTs 01/2020 ratio 68, FEV1 66%, FVC 74%, DLCO 21.5/84%  Review of Systems neg for any significant sore throat, dysphagia, itching, sneezing, nasal congestion or excess/ purulent secretions, fever, chills, sweats, unintended wt loss, pleuritic or exertional cp, hempoptysis, orthopnea pnd or change in chronic leg swelling. Also denies presyncope, palpitations, heartburn, abdominal pain, nausea, vomiting, diarrhea or change in bowel or urinary habits, dysuria,hematuria, rash, arthralgias, visual complaints, headache, numbness weakness or ataxia.     Objective:   Physical  Exam  Gen. Pleasant, obese, in no distress ENT - no lesions, no post nasal drip Neck: No JVD, no thyromegaly, no carotid bruits Lungs: no use of accessory muscles, no dullness to percussion, decreased without rales or rhonchi  Cardiovascular: Rhythm regular, heart sounds  normal, no murmurs or gallops, no peripheral edema Musculoskeletal: No deformities, no cyanosis or clubbing , no tremors       Assessment & Plan:

## 2022-01-15 NOTE — Assessment & Plan Note (Signed)
We discussed COPD action plan and signs and symptoms of exacerbation.  He has tried Paediatric nurse and both of these did not work for him and he does not want to use chronic bronchodilators.  We discussed the role of preventing exacerbations  We discussed vaccination status-.  He refuses flu shot

## 2022-01-15 NOTE — Patient Instructions (Addendum)
CPAP is working well  Weight loss recommended

## 2022-01-15 NOTE — Assessment & Plan Note (Signed)
CPAP download was reviewed which shows excellent control of events on auto CPAP 5 to 20 cm with average pressure of 17 and maximum pressure of 19 cm.  With minimal leak. He is very compliant and CPAP is only helped improve his daytime somnolence and fatigue  Weight loss encouraged, compliance with goal of at least 4-6 hrs every night is the expectation. Advised against medications with sedative side effects Cautioned against driving when sleepy - understanding that sleepiness will vary on a day to day basis

## 2022-01-30 ENCOUNTER — Other Ambulatory Visit: Payer: Self-pay | Admitting: Adult Health

## 2022-01-30 DIAGNOSIS — M545 Low back pain, unspecified: Secondary | ICD-10-CM

## 2022-02-04 ENCOUNTER — Other Ambulatory Visit: Payer: Self-pay | Admitting: Internal Medicine

## 2022-02-04 NOTE — Telephone Encounter (Signed)
Pt's pharmacy is requesting a refill on albuterol inhaler. Would Dr. Gasper Sells like to refill this medication? Please address

## 2022-02-12 ENCOUNTER — Other Ambulatory Visit: Payer: Self-pay | Admitting: Internal Medicine

## 2022-02-12 NOTE — Telephone Encounter (Signed)
Pt's pharmacy is requesting a refill on albuterol inhaler, would Dr. Gasper Sells like to refill this medication? Please address

## 2022-02-23 ENCOUNTER — Encounter: Payer: Self-pay | Admitting: Adult Health

## 2022-03-14 ENCOUNTER — Other Ambulatory Visit: Payer: Self-pay | Admitting: Adult Health

## 2022-03-14 DIAGNOSIS — M109 Gout, unspecified: Secondary | ICD-10-CM

## 2022-03-15 ENCOUNTER — Other Ambulatory Visit: Payer: Self-pay | Admitting: Internal Medicine

## 2022-03-15 DIAGNOSIS — I10 Essential (primary) hypertension: Secondary | ICD-10-CM

## 2022-03-15 DIAGNOSIS — I4891 Unspecified atrial fibrillation: Secondary | ICD-10-CM

## 2022-05-05 ENCOUNTER — Ambulatory Visit: Payer: BC Managed Care – PPO | Admitting: Adult Health

## 2022-05-07 ENCOUNTER — Ambulatory Visit (INDEPENDENT_AMBULATORY_CARE_PROVIDER_SITE_OTHER): Payer: BC Managed Care – PPO | Admitting: Adult Health

## 2022-05-07 VITALS — BP 130/82 | HR 91 | Temp 98.1°F | Ht 72.0 in | Wt 331.0 lb

## 2022-05-07 DIAGNOSIS — J988 Other specified respiratory disorders: Secondary | ICD-10-CM | POA: Diagnosis not present

## 2022-05-07 MED ORDER — DOXYCYCLINE HYCLATE 100 MG PO CAPS: 100.0000 mg | ORAL_CAPSULE | Freq: Two times a day (BID) | ORAL | 0 refills | Status: DC

## 2022-05-07 NOTE — Progress Notes (Signed)
Subjective:    Patient ID: Matthew Savage, male    DOB: Mar 22, 1961, 62 y.o.   MRN: [id number]  Cough   62 year old male who  has a past medical history of Blood in urine, Class 2 obesity (09/30/2020), DDD (degenerative disc disease), DVT (deep venous thrombosis) ([place]) (2017 & 2018), Gout, Heart murmur, Hyperlipidemia, Hypertension, Numbness and tingling, and SCC (squamous cell carcinoma).  He presents to the office today for an acute issue. His symptoms started about 5 days ago. Symptoms include a productive cough with green mucus and sore throat.   He denies fevers, chills,wheezing/shortness of breath sinus pain or pressure   He has a history of COPD   Review of Systems  Respiratory:  Positive for cough.    See HPI   Past Medical History:  Diagnosis Date   Blood in urine    Class 2 obesity 09/30/2020   DDD (degenerative disc disease)    DVT (deep venous thrombosis) ([place]) 2017 & 2018   Gout    Heart murmur    Hyperlipidemia    Hypertension    Numbness and tingling    right arm and hand   SCC (squamous cell carcinoma)    skin, squamous cell, face    Social History   Socioeconomic History   Marital status: Single    Spouse name: Not on file   Number of children: Not on file   Years of education: Not on file   Highest education level: Associate degree: occupational, Hotel manager, or vocational program  Occupational History   Not on file  Tobacco Use   Smoking status: Former    Packs/day: 2.00    Years: 36.00    Total pack years: 72.00    Types: Cigarettes    Quit date: 01/01/2020    Years since quitting: 2.3   Smokeless tobacco: Never  Substance and Sexual Activity   Alcohol use: Yes    Alcohol/week: 12.0 standard drinks of alcohol    Types: 12 Standard drinks or equivalent per week    Comment: 2 per day   Drug use: No   Sexual activity: Not on file  Other Topics Concern   Not on file  Social History Narrative   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     Married    No kids   Social Determinants of Health   Financial Resource Strain: Low Risk  (06/30/2021)   Overall Financial Resource Strain (CARDIA)    Difficulty of Paying Living Expenses: Not hard at all  Food Insecurity: No Food Insecurity (06/30/2021)   Hunger Vital Sign    Worried About Running Out of Food in the Last Year: Never true    Ran Out of Food in the Last Year: Never true  Transportation Needs: No Transportation Needs (06/30/2021)   PRAPARE - Hydrologist (Medical): No    Lack of Transportation (Non-Medical): No  Physical Activity: Sufficiently Active (06/30/2021)   Exercise Vital Sign    Days of Exercise per Week: 3 days    Minutes of Exercise per Session: 60 min  Stress: No Stress Concern Present (06/30/2021)   [place]    Feeling of Stress : Not at all  Social Connections: Socially Isolated (06/30/2021)   Social Connection and Isolation Panel [NHANES]    Frequency of Communication with Friends and Family: Three times a week    Frequency of Social Gatherings with Friends  and Family: Twice a week    Attends Religious Services: Never    Active Member of Clubs or Organizations: No    Attends Music therapist: Not on file    Marital Status: Divorced  Intimate Partner Violence: Not on file    Past Surgical History:  Procedure Laterality Date   A-FLUTTER ABLATION N/A 03/11/2020   Procedure: A-FLUTTER ABLATION;  Surgeon: Deboraha Sprang, MD;  Location: [facility];  Service: Cardiovascular;  Laterality: N/A;   APPLICATION OF WOUND VAC  10/18/2020   Procedure: APPLICATION OF WOUND VAC;  Surgeon: Lajuana Matte, MD;  Location: [facility];  Service: Thoracic;;   ASD REPAIR N/A 10/03/2020   Procedure: ATRIAL SEPTAL DEFECT (ASD) REPAIR WITH PERI GUARD PERICARDIUM;  Surgeon: Wonda Olds, MD;  Location: [facility];  Service: Open Heart Surgery;  Laterality: N/A;    CLIPPING OF ATRIAL APPENDAGE Left 10/03/2020   Procedure: CLIPPING OF ATRIAL APPENDAGE WITH ATRICURE 82 CLIP;  Surgeon: Wonda Olds, MD;  Location: [place];  Service: Open Heart Surgery;  Laterality: Left;   CORONARY ARTERY BYPASS GRAFT N/A 10/03/2020   Procedure: CORONARY ARTERY BYPASS GRAFTING (CABG) X FOUR, USING BILATERAL MAMMARY ARTERIES AND RIGHT ENDOSCOPIC GREATER SAPHENOUS VEIN CONDUITS;  Surgeon: Wonda Olds, MD;  Location: [facility];  Service: Open Heart Surgery;  Laterality: N/A;   HERNIA REPAIR  1967   X 2   IR THORACENTESIS ASP PLEURAL SPACE W/IMG GUIDE  10/28/2020   LAPAROSCOPIC GASTRIC BANDING  03/2009   in [place] CATH AND CORONARY ANGIOGRAPHY N/A 10/01/2020   Procedure: LEFT HEART CATH AND CORONARY ANGIOGRAPHY;  Surgeon: Troy Sine, MD;  Location: [facility];  Service: Cardiovascular;  Laterality: N/A;   MASS EXCISION  12/03/2011   X 2 (gluteal) X 1 (lower back)   MOHS SURGERY     x 2   STERNAL WOUND DEBRIDEMENT N/A 10/18/2020   Procedure: STERNAL WOUND DEBRIDEMENT;  Surgeon: Lajuana Matte, MD;  Location: [place] [place];  Service: Thoracic;  Laterality: N/A;   STERNAL WOUND DEBRIDEMENT N/A 10/23/2020   Procedure: WOUND VAC CHANGE with APPLICATION OF MYRIAD MORCELLS;  Surgeon: Wonda Olds, MD;  Location: [facility];  Service: Thoracic;  Laterality: N/A;   TEE WITHOUT CARDIOVERSION N/A 10/03/2020   Procedure: TRANSESOPHAGEAL ECHOCARDIOGRAM (TEE);  Surgeon: Wonda Olds, MD;  Location: [facility];  Service: Open Heart Surgery;  Laterality: N/A;    Family History  Problem Relation Age of Onset   Breast cancer Mother    Other Mother        small cell carcinoma   Hypertension Father    Colon polyps Father    Colon polyps Brother     Allergies  Allergen Reactions   Levaquin [Levofloxacin]     Aortic Aneurysm    Current Outpatient Medications on File Prior to Visit  Medication Sig Dispense Refill   acetaminophen (TYLENOL) 325 MG tablet Take 2  tablets (650 mg total) by mouth every 6 (six) hours as needed for fever.     albuterol (VENTOLIN HFA) 108 (90 Base) MCG/ACT inhaler Inhale 2 puffs into the lungs every 6 (six) hours as needed for wheezing or shortness of breath. 8 g 2   allopurinol (ZYLOPRIM) 300 MG tablet TAKE 1 TABLET BY MOUTH EVERY DAY 90 tablet 0   amiodarone (PACERONE) 200 MG tablet TAKE 1 TABLET BY MOUTH EVERY DAY 90 tablet 2   atorvastatin (LIPITOR) 80  MG tablet TAKE 1 TABLET BY MOUTH EVERY DAY 90 tablet 3   cetirizine (ZYRTEC) 10 MG tablet Take 10 mg by mouth at bedtime.     colchicine 0.6 MG tablet Take 0.6 mg by mouth daily as needed (Flair up gout).      cyclobenzaprine (FLEXERIL) 10 MG tablet TAKE 1 TABLET BY MOUTH EVERYDAY AT BEDTIME 30 tablet 3   FARXIGA 10 MG TABS tablet TAKE 1 TABLET BY MOUTH DAILY BEFORE BREAKFAST. 90 tablet 3   ferrous [id number] C-folic acid (TRINSICON / FOLTRIN) capsule Take 1 capsule by mouth daily.     metoprolol succinate (TOPROL-XL) 25 MG 24 hr tablet TAKE 1 TABLET BY MOUTH EVERY DAY WITH OR IMMEDIATELY FOLLOWING A MEAL 90 tablet 2   oxyCODONE (OXY IR/ROXICODONE) 5 MG immediate release tablet Take 1 tablet (5 mg total) by mouth every 6 (six) hours as needed for severe pain. 24 tablet 0   potassium chloride SA (KLOR-CON M) 20 MEQ tablet Take 1 tablet (20 mEq total) by mouth daily. 90 tablet 3   prednisoLONE acetate (PRED FORTE) 1 % ophthalmic suspension Place 1 drop into the left eye 2 (two) times daily.     torsemide (DEMADEX) 20 MG tablet Take 2 tablets (40 mg total) by mouth daily. 180 tablet 3   valACYclovir (VALTREX) 500 MG tablet Take 500 mg by mouth daily.     XARELTO 20 MG TABS tablet TAKE 1 TABLET BY MOUTH DAILY WITH SUPPER. 30 tablet 0   No current facility-administered medications on file prior to visit.    BP 130/82   Pulse 91   Temp 98.1 F (36.7 C) (Oral)   Ht 6' (1.829 m)   Wt (!) 331 lb (150.1 kg)   SpO2 97%   BMI 44.89 kg/m       Objective:    Physical Exam Vitals and nursing note reviewed.  Constitutional:      Appearance: Normal appearance. He is obese.  HENT:     Right Ear: Tympanic membrane, ear canal and external ear normal.     Left Ear: Tympanic membrane, ear canal and external ear normal.     Nose: Nose normal. No congestion or rhinorrhea.     Mouth/Throat:     Mouth: Mucous membranes are moist.     Pharynx: Oropharynx is clear. No oropharyngeal exudate or posterior oropharyngeal erythema.  Eyes:     Extraocular Movements: Extraocular movements intact.     Pupils: Pupils are equal, round, and reactive to light.  Cardiovascular:     Rate and Rhythm: Normal rate and regular rhythm.     Pulses: Normal pulses.     Heart sounds: Normal heart sounds.  Pulmonary:     Effort: Pulmonary effort is normal.     Breath sounds: Normal breath sounds.  Neurological:     Mental Status: He is alert.        Assessment & Plan:  1. Respiratory infection - Will treat due to significant pulmonary disease  - doxycycline (VIBRAMYCIN) 100 MG capsule; Take 1 capsule (100 mg total) by mouth 2 (two) times daily.  Dispense: 14 capsule; Refill: 0   Dorothyann Peng, NP

## 2022-05-20 NOTE — Progress Notes (Unsigned)
Cardiology Office Note:    Date:  05/21/2022   ID:  Matthew Savage, DOB [birth year], MRN [id number]  PCP:  Dorothyann Peng, NP  [facility] Cardiologist:  Werner Lean, MD  [facility] Electrophysiologist:  Virl Axe, MD   Referring MD: Dorothyann Peng, NP   VZ:DGLOVF up AF  History of Present Illness:    Matthew Savage is a 62 y.o. male with a hx of morbid obesity s/p lap Band atrial fibrillation (paroxsymal) atrial flutter (first diagnoses in [place]), essential hypertension, mild aortic stenosis, hyperlipidemia, and tobacco use who established at [facility] 12/28/19.  Had AF and was s/p ablation.   2022; Had NSTEMI 09/2020.  Found to have multi-vessel CAD, ASD repair, and LAA clip.  Complicated by ileus and post operative flutter. And HFrEF.  Then sternal wound and bacteremia.  Then AKI.  Returned to [place] to rehab with family.  Complete CR.  Started back to work.  2023: Found to have COPD on amio screening PFTs and vertiginous symptoms. 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  No chest pain.  Patient notes that he is doing great.   Since last visit notes had hearing aids . Needs financial help for Xarelto (this if for DVT, he had LAA clip)  No chest pain or pressure.  No SOB/DOE and no PND/Orthopnea.  No weight gain or leg swelling  No palpitations or syncope .   Past Medical History:  Diagnosis Date   Blood in urine    Class 2 obesity 09/30/2020   DDD (degenerative disc disease)    DVT (deep venous thrombosis) ([place]) 2017 & 2018   Gout    Heart murmur    Hyperlipidemia    Hypertension    Numbness and tingling    right arm and hand   SCC (squamous cell carcinoma)    skin, squamous cell, face    Past Surgical History:  Procedure Laterality Date   A-FLUTTER ABLATION N/A 03/11/2020   Procedure: A-FLUTTER ABLATION;  Surgeon: Deboraha Sprang, MD;  Location: [place] [facility];  Service: Cardiovascular;  Laterality: N/A;   APPLICATION OF WOUND VAC  10/18/2020    Procedure: APPLICATION OF WOUND VAC;  Surgeon: Lajuana Matte, MD;  Location: [place];  Service: Thoracic;;   ASD REPAIR N/A 10/03/2020   Procedure: ATRIAL SEPTAL DEFECT (ASD) REPAIR WITH PERI GUARD PERICARDIUM;  Surgeon: Wonda Olds, MD;  Location: [place];  Service: Open Heart Surgery;  Laterality: N/A;   CLIPPING OF ATRIAL APPENDAGE Left 10/03/2020   Procedure: CLIPPING OF ATRIAL APPENDAGE WITH ATRICURE 18 CLIP;  Surgeon: Wonda Olds, MD;  Location: [place];  Service: Open Heart Surgery;  Laterality: Left;   CORONARY ARTERY BYPASS GRAFT N/A 10/03/2020   Procedure: CORONARY ARTERY BYPASS GRAFTING (CABG) X FOUR, USING BILATERAL MAMMARY ARTERIES AND RIGHT ENDOSCOPIC GREATER SAPHENOUS VEIN CONDUITS;  Surgeon: Wonda Olds, MD;  Location: [place];  Service: Open Heart Surgery;  Laterality: N/A;   HERNIA REPAIR  1967   X 2   IR THORACENTESIS ASP PLEURAL SPACE W/IMG GUIDE  10/28/2020   LAPAROSCOPIC GASTRIC BANDING  03/2009   in [place], [place] CATH AND CORONARY ANGIOGRAPHY N/A 10/01/2020   Procedure: LEFT HEART CATH AND CORONARY ANGIOGRAPHY;  Surgeon: Troy Sine, MD;  Location: [place] [facility];  Service: Cardiovascular;  Laterality: N/A;   MASS EXCISION  12/03/2011   X 2 (gluteal) X 1 (lower back)   MOHS SURGERY  x 2   STERNAL WOUND DEBRIDEMENT N/A 10/18/2020   Procedure: STERNAL WOUND DEBRIDEMENT;  Surgeon: Lajuana Matte, MD;  Location: [place];  Service: Thoracic;  Laterality: N/A;   STERNAL WOUND DEBRIDEMENT N/A 10/23/2020   Procedure: WOUND VAC CHANGE with APPLICATION OF MYRIAD MORCELLS;  Surgeon: Wonda Olds, MD;  Location: [place];  Service: Thoracic;  Laterality: N/A;   TEE WITHOUT CARDIOVERSION N/A 10/03/2020   Procedure: TRANSESOPHAGEAL ECHOCARDIOGRAM (TEE);  Surgeon: Wonda Olds, MD;  Location: [place];  Service: Open Heart Surgery;  Laterality: N/A;   Current Medications: Current Meds  Medication Sig   acetaminophen (TYLENOL) 325 MG tablet Take  2 tablets (650 mg total) by mouth every 6 (six) hours as needed for fever.   albuterol (VENTOLIN HFA) 108 (90 Base) MCG/ACT inhaler Inhale 2 puffs into the lungs every 6 (six) hours as needed for wheezing or shortness of breath.   allopurinol (ZYLOPRIM) 300 MG tablet TAKE 1 TABLET BY MOUTH EVERY DAY   amiodarone (PACERONE) 200 MG tablet TAKE 1 TABLET BY MOUTH EVERY DAY   atorvastatin (LIPITOR) 80 MG tablet TAKE 1 TABLET BY MOUTH EVERY DAY   cetirizine (ZYRTEC) 10 MG tablet Take 10 mg by mouth at bedtime.   colchicine 0.6 MG tablet Take 0.6 mg by mouth daily as needed (Flair up gout).    cyclobenzaprine (FLEXERIL) 10 MG tablet TAKE 1 TABLET BY MOUTH EVERYDAY AT BEDTIME   FARXIGA 10 MG TABS tablet TAKE 1 TABLET BY MOUTH DAILY BEFORE BREAKFAST.   metoprolol succinate (TOPROL-XL) 25 MG 24 hr tablet TAKE 1 TABLET BY MOUTH EVERY DAY WITH OR IMMEDIATELY FOLLOWING A MEAL   potassium chloride SA (KLOR-CON M) 20 MEQ tablet Take 1 tablet (20 mEq total) by mouth daily.   torsemide (DEMADEX) 20 MG tablet Take 2 tablets (40 mg total) by mouth daily.   valACYclovir (VALTREX) 500 MG tablet Take 500 mg by mouth daily.   XARELTO 20 MG TABS tablet TAKE 1 TABLET BY MOUTH DAILY WITH SUPPER.   [DISCONTINUED] doxycycline (VIBRAMYCIN) 100 MG capsule Take 1 capsule (100 mg total) by mouth 2 (two) times daily.   [DISCONTINUED] ferrous [id number]-[id number] C-folic acid (TRINSICON / FOLTRIN) capsule Take 1 capsule by mouth daily.   [DISCONTINUED] oxyCODONE (OXY IR/ROXICODONE) 5 MG immediate release tablet Take 1 tablet (5 mg total) by mouth every 6 (six) hours as needed for severe pain.   [DISCONTINUED] prednisoLONE acetate (PRED FORTE) 1 % ophthalmic suspension Place 1 drop into the left eye 2 (two) times daily.     Allergies:   Levaquin [levofloxacin]   Social History   Socioeconomic History   Marital status: Single    Spouse name: Not on file   Number of children: Not on file   Years of education: Not on file    Highest education level: Associate degree: occupational, Hotel manager, or vocational program  Occupational History   Not on file  Tobacco Use   Smoking status: Former    Packs/day: 2.00    Years: 36.00    Total pack years: 72.00    Types: Cigarettes    Quit date: 01/01/2020    Years since quitting: 2.3   Smokeless tobacco: Never  Substance and Sexual Activity   Alcohol use: Yes    Alcohol/week: 12.0 standard drinks of alcohol    Types: 12 Standard drinks or equivalent per week    Comment: 2 per day   Drug use: No   Sexual activity: Not on file  Other  Topics Concern   Not on file  Social History Narrative   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    Married    No kids   Social Determinants of Health   Financial Resource Strain: Low Risk  (07/21/2021)   Overall Financial Resource Strain (CARDIA)    Difficulty of Paying Living Expenses: Not hard at all  Food Insecurity: No Food Insecurity (Jul 21, 2021)   Hunger Vital Sign    Worried About Running Out of Food in the Last Year: Never true    Ran Out of Food in the Last Year: Never true  Transportation Needs: No Transportation Needs (July 21, 2021)   PRAPARE - Hydrologist (Medical): No    Lack of Transportation (Non-Medical): No  Physical Activity: Sufficiently Active (Jul 21, 2021)   Exercise Vital Sign    Days of Exercise per Week: 3 days    Minutes of Exercise per Session: 60 min  Stress: No Stress Concern Present (07-21-2021)   [place]    Feeling of Stress : Not at all  Social Connections: Socially Isolated (21-Jul-2021)   Social Connection and Isolation Panel [NHANES]    Frequency of Communication with Friends and Family: Three times a week    Frequency of Social Gatherings with Friends and Family: Twice a week    Attends Religious Services: Never    Marine scientist or Organizations: No    Attends Music therapist:  Not on file    Marital Status: Divorced    Social: From [place], his father passed away in late 2020/07/21 of PNA and COPD; works in [place] and we discuss things  Family History: The patient's family history includes Breast cancer in his mother; Colon polyps in his brother and father; Hypertension in his father; Other in his mother. Cousin has PE and died.  No hx of Bicuspid Valve in Family.  No hx of SCD.   ROS:   Please see the history of present illness.    All other systems reviewed and are negative.  EKGs/Labs/Other Studies Reviewed:    The following studies were reviewed today:  EKG:   1/31//24 11/19/21: AFL with ashman beats rate 72 02/29/20 atrial flutter with ventricular rate of 94 and Ashman beats vs occasional PVCs 12/22/19 EKG notable for atrial flutter rate 96  Cardiac Studies & Procedures   CARDIAC CATHETERIZATION  CARDIAC CATHETERIZATION 10/01/2020  Narrative  Prox LAD lesion is 70% stenosed.  Mid LAD lesion is 80% stenosed.  Prox Cx lesion is 80% stenosed.  1st Mrg-1 lesion is 95% stenosed.  1st Mrg-2 lesion is 100% stenosed.  Mid Cx to Dist Cx lesion is 100% stenosed.  2nd Mrg lesion is 80% stenosed.  Ost RCA lesion is 70% stenosed.  Prox RCA to Dist RCA lesion is 100% stenosed.  Lat 2nd Mrg lesion is 80% stenosed.  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  There is extensive collateralization with right to right and some left-to-right collateralization to a large distal RCA supplying PDA and PLA vessels.  Global LV dysfunction with EF estimated 50% with a small region of mild mid inferior hypocontractility.  LVEDP 17 mmHg.  RECOMMENDATION: Surgical  consultation for CABG revascularization surgery.  For 2D echo Doppler study.  He will be started on heparin procedure and Xarelto will continue to be held in anticipation for CABG revascularization.  Findings Coronary Findings Diagnostic  Dominance: Right  Left Anterior Descending Prox LAD lesion is 70% stenosed. Mid LAD lesion is 80% stenosed.  Left Circumflex Prox Cx lesion is 80% stenosed. Mid Cx to Dist Cx lesion is 100% stenosed.  First Obtuse Marginal Branch 1st Mrg-1 lesion is 95% stenosed. 1st Mrg-2 lesion is 100% stenosed.  Second Obtuse Marginal Branch Vessel is small in size. 2nd Mrg lesion is 80% stenosed.  Lateral Second Obtuse Marginal Branch Lat 2nd Mrg lesion is 80% stenosed.  Right Coronary Artery Ost RCA lesion is 70% stenosed. Prox RCA to Dist RCA lesion is 100% stenosed.  Right Posterior Atrioventricular Artery Collaterals RPAV filled by collaterals from Ost RCA.  Collaterals RPAV filled by collaterals from Mid Cx.  Intervention  No interventions have been documented.     ECHOCARDIOGRAM  ECHOCARDIOGRAM LIMITED 10/07/2020  Narrative ECHOCARDIOGRAM LIMITED REPORT    Patient Name:   Matthew Savage Date of Exam: 10/07/2020 Medical Rec #:  [id number]      Height:       75.0 in Accession #:    [id number]     Weight:       312.8 lb Date of Birth:  [birth year]      BSA:          2.654 m Patient Age:    76 years       BP:           135/113 mmHg Patient Gender: M              HR:           66 bpm. Exam Location:  Inpatient  Procedure: Limited Echo, Color Doppler and Intracardiac Opacification Agent  Indications:    I50.40* Unspecified combined systolic (congestive) and diastolic (congestive) heart failure  History:        Patient has prior history of Echocardiogram examinations, most recent 10/02/2020. CAD and Previous Myocardial Infarction, Aortic Valve Disease, Signs/Symptoms:Chest Pain; Risk Factors:Sleep Apnea and  Hypertension.  Sonographer:    Roseanna Rainbow RDCS Referring Phys: [id number] [facility] Z ATKINS   Sonographer Comments: Technically difficult study due to poor echo windows and patient is morbidly obese. Image acquisition challenging due to patient body habitus. IMPRESSIONS   1. Left ventricular ejection fraction, by estimation, is 45 to 50%. The left ventricle has mildly decreased function. The left ventricle demonstrates regional wall motion abnormalities (see scoring diagram/findings for description). There is moderate left ventricular hypertrophy. There is incoordinate septal motion. There is moderate hypokinesis/dyskinesis of the left ventricular, mid-apical anterior wall, anteroseptal wall and inferoapical segment. 2. The aortic valve has an indeterminant number of cusps. Mild to moderate aortic valve stenosis. Aortic valve area, by VTI measures 1.60 cm. Aortic valve mean gradient measures 26.0 mmHg. Aortic valve Vmax measures 3.56 m/s.  Comparison(s): Changes from prior study are noted. 10/02/2020: LVEF 50-55%, mild to moderate aortic valve setnosis - mean gradient 29 mmHg.  FINDINGS Left Ventricle: Left ventricular ejection fraction, by estimation, is 45 to 50%. The left ventricle has mildly decreased function. The left ventricle demonstrates regional wall motion abnormalities. Moderate hypokinesis/dyskinesis of the left ventricular, mid-apical anterior wall, anteroseptal wall and inferoapical segment. Definity contrast agent was given IV to delineate the left ventricular endocardial borders. The left ventricular internal cavity size was  normal in size. There is moderate left ventricular hypertrophy. Incoordinate septal motion.  Aortic Valve: The aortic valve has an indeterminant number of cusps. Mild to moderate aortic stenosis is present. Aortic valve mean gradient measures 26.0 mmHg. Aortic valve peak gradient measures 50.7 mmHg. Aortic valve area, by VTI measures 1.60 cm.  Venous: IVC  assessment for right atrial pressure unable to be performed due to mechanical ventilation.  LEFT VENTRICLE PLAX 2D LVIDd:         5.30 cm LVIDs:         4.50 cm LV PW:         1.40 cm LV IVS:        1.30 cm LVOT diam:     2.40 cm LV SV:         105 LV SV Index:   40 LVOT Area:     4.52 cm  LV Volumes (MOD) LV vol d, MOD A2C: 185.0 ml LV vol d, MOD A4C: 163.0 ml LV vol s, MOD A2C: 129.0 ml LV vol s, MOD A4C: 66.0 ml LV SV MOD A2C:     56.0 ml LV SV MOD A4C:     163.0 ml LV SV MOD BP:      76.8 ml  LEFT ATRIUM         Index LA diam:    4.20 cm 1.58 cm/m AORTIC VALVE AV Area (Vmax):    1.70 cm AV Area (Vmean):   1.59 cm AV Area (VTI):     1.60 cm AV Vmax:           356.00 cm/s AV Vmean:          230.000 cm/s AV VTI:            0.654 m AV Peak Grad:      50.7 mmHg AV Mean Grad:      26.0 mmHg LVOT Vmax:         134.00 cm/s LVOT Vmean:        80.900 cm/s LVOT VTI:          0.232 m LVOT/AV VTI ratio: 0.35  AORTA Ao Root diam: 4.10 cm  MITRAL VALVE MV Area (PHT): 2.45 cm    SHUNTS MV Decel Time: 310 msec    Systemic VTI:  0.23 m MV E velocity: 76.30 cm/s  Systemic Diam: 2.40 cm MV A velocity: 62.10 cm/s MV E/A ratio:  1.23  Lyman Bishop MD Electronically signed by Lyman Bishop MD Signature Date/Time: 10/07/2020/12:52:34 PM    Final   TEE  ECHO INTRAOPERATIVE TEE 11/06/2020  Interpretation Summary   Left ventricle: Cavity is mildly dilated. Concentric hypertrophy of moderate severity. Wall motion is abnormal.   Septum: Interatrial septal defect present with left to right shunting visualized by color Doppler. Patent Foramen Ovale present with left to right shunt visualized by color Doppler. Lipomatous atrial septal hypertrophy present.   Left atrium: Cavity is dilated and dilated. Lipomatous hypertrophy (normal variant) of the interatrial septum is noted. Patent foramen ovale present with left to right shunting indicated by color flow Doppler. Left atrial  appendage filling and emptying velocities are normal.  Dense spontaneous echo contrast in the atrial body. Interatrial septal defect present with left to right shunting indicated by color flow Doppler. Interatrial septal aneurysm present.   Aortic valve: Moderate valve thickening present. Moderate valve calcification present. Mild stenosis. Mild regurgitation with a centrally directed jet.   Mitral valve:  Mild regurgitation. The annulus is mildy.  Right ventricle:  Normal cavity size and ejection fraction. There is hypertrophy.   Tricuspid valve: Trace regurgitation.   Pulmonic valve: Mild regurgitation.             Recent Labs: 07/01/2021: ALT 30; TSH 1.560 12/03/2021: BUN 37; Creatinine, Ser 1.95; NT-Pro BNP 924; Potassium 4.6; Sodium 139  Recent Lipid Panel    Component Value Date/Time   CHOL 114 07/08/2020 0847   TRIG 74 07/08/2020 0847   HDL 50 07/08/2020 0847   CHOLHDL 2.3 07/08/2020 0847   CHOLHDL 4 06/21/2019 0729   VLDL 17.0 06/21/2019 0729   LDLCALC 49 07/08/2020 0847   LDLDIRECT 135.1 04/06/2011 0835    Physical Exam:    VS:  BP 110/74   Pulse 90   Ht 6' (1.829 m)   Wt (!) 331 lb (150.1 kg)   SpO2 96%   BMI 44.89 kg/m     Wt Readings from Last 3 Encounters:  05/21/22 (!) 331 lb (150.1 kg)  05/07/22 (!) 331 lb (150.1 kg)  01/15/22 (!) 320 lb 9.6 oz (145.4 kg)    Gen: No distress, morbid obesity Neck: No JVD Cardiac: No rubs or gallops, soft systolic murmur  +2 radial pulses Respiratory: Clear to auscultation bilaterally, normal effort, normal  respiratory rate GI: Soft, nontender, non-distended  MS: no edema  moves all extremities Integument: Skin feels warm Neuro:  At time of evaluation, alert and oriented to person/place/time/situation Psych: Normal affect, patient feels OK  ASSESSMENT:    1. Therapeutic drug monitoring   2. Aortic valve disorder   3. HFrEF (heart failure with reduced ejection fraction) ([place])   4. Mild aortic stenosis by prior  echocardiogram   5. Typical atrial flutter ([place])   6. Morbid obesity ([place])   7. Nonrheumatic aortic valve stenosis   8. S/P CABG (coronary artery bypass graft)     PLAN:    CAD s/p CABG HLD - continue DOAC for AF - continue atorvastatin 80 mg - Repeat Echo in 2024  Heart Failure mildly reduced Ejection Fraction  Morbid Obesity HTN with DM Mild Aortic Ectasia 41 mm Mild-Mod AS CKD stage IIIa - NYHA class I, Stage B, eurvolemic, etiology from AF - Diuretic regimen: Torsemide 40 mg with  20 meq K - metoprolol succinate   - clarifying his most cost effective SGLT2i  Long standing persistent Atrial Flutter, Atrial Fibrillation (s/p LAA clip) - CHADSVASC 4 (HTN with DM, CAD, CHF) Hx of DVT on Xarelto OSA - on CPAP - following with Dr. Caryl Comes - on amiodarone 200 mg Daily, will recheck safety labs/tests in 2024  - will reach out to EP for eval for repeat ablation ( he would like to get off amiodarone)  COPD Former Tobacco Use - with continued cessation - sees Pulm      Medication Adjustments/Labs and Tests Ordered: Current medicines are reviewed at length with the patient today.  Concerns regarding medicines are outlined above.  Orders Placed This Encounter  Procedures   TSH   Hepatic function panel   No orders of the defined types were placed in this encounter.   Patient Instructions  Medication Instructions:  Your physician recommends that you continue on your current medications as directed. Please refer to the Current Medication list given to you today.  *If you need a refill on your cardiac medications before your next appointment, please call your pharmacy*   Lab Work: TODAY: TSH, LFT  If you have labs (blood work) drawn today and your  tests are completely normal, you will receive your results only by: MyChart Message (if you have MyChart) OR A paper copy in the mail If you have any lab test that is abnormal or we need to change your treatment, we will  call you to review the results.   Testing/Procedures: NONE   Follow-Up: At [facility], you and your health needs are our priority.  As part of our continuing mission to provide you with exceptional heart care, we have created designated Provider Care Teams.  These Care Teams include your primary Cardiologist (physician) and Advanced Practice Providers (APPs -  Physician Assistants and Nurse Practitioners) who all work together to provide you with the care you need, when you need it.   Your next appointment:   1 year(s)  Provider:   Werner Lean, MD       Signed, Werner Lean, MD  05/21/2022 12:48 PM    [place]

## 2022-05-21 ENCOUNTER — Ambulatory Visit: Payer: BC Managed Care – PPO | Attending: Internal Medicine | Admitting: Internal Medicine

## 2022-05-21 ENCOUNTER — Encounter: Payer: Self-pay | Admitting: Internal Medicine

## 2022-05-21 VITALS — BP 110/74 | HR 90 | Ht 72.0 in | Wt 331.0 lb

## 2022-05-21 DIAGNOSIS — I350 Nonrheumatic aortic (valve) stenosis: Secondary | ICD-10-CM | POA: Diagnosis not present

## 2022-05-21 DIAGNOSIS — I712 Thoracic aortic aneurysm, without rupture, unspecified: Secondary | ICD-10-CM

## 2022-05-21 DIAGNOSIS — I5020 Unspecified systolic (congestive) heart failure: Secondary | ICD-10-CM | POA: Diagnosis not present

## 2022-05-21 DIAGNOSIS — I359 Nonrheumatic aortic valve disorder, unspecified: Secondary | ICD-10-CM

## 2022-05-21 DIAGNOSIS — Z5181 Encounter for therapeutic drug level monitoring: Secondary | ICD-10-CM

## 2022-05-21 DIAGNOSIS — Z951 Presence of aortocoronary bypass graft: Secondary | ICD-10-CM

## 2022-05-21 DIAGNOSIS — I483 Typical atrial flutter: Secondary | ICD-10-CM

## 2022-05-21 LAB — HEPATIC FUNCTION PANEL
ALT: 31 IU/L (ref 0–44)
AST: 28 IU/L (ref 0–40)
Albumin: 4.6 g/dL (ref 3.9–4.9)
Alkaline Phosphatase: 108 IU/L (ref 44–121)
Bilirubin Total: 0.6 mg/dL (ref 0.0–1.2)
Bilirubin, Direct: 0.24 mg/dL (ref 0.00–0.40)
Total Protein: 7.1 g/dL (ref 6.0–8.5)

## 2022-05-21 LAB — TSH: TSH: 1.33 u[IU]/mL (ref 0.450–4.500)

## 2022-05-21 NOTE — Addendum Note (Signed)
Addended by: Precious Gilding on: 05/21/2022 01:21 PM   Modules accepted: Orders

## 2022-05-21 NOTE — Patient Instructions (Addendum)
Medication Instructions:  Your physician recommends that you continue on your current medications as directed. Please refer to the Current Medication list given to you today.  *If you need a refill on your cardiac medications before your next appointment, please call your pharmacy*   Lab Work: TODAY: TSH, LFT  If you have labs (blood work) drawn today and your tests are completely normal, you will receive your results only by: [place] (if you have MyChart) OR A paper copy in the mail If you have any lab test that is abnormal or we need to change your treatment, we will call you to review the results.   Testing/Procedures: Your physician has requested that you have an echocardiogram. Echocardiography is a painless test that uses sound waves to create images of your heart. It provides your doctor with information about the size and shape of your heart and how well your heart's chambers and valves are working. This procedure takes approximately one hour. There are no restrictions for this procedure. Please do NOT wear cologne, perfume, aftershave, or lotions (deodorant is allowed). Please arrive 15 minutes prior to your appointment time. Called pt scheduled Echo for 06/17/22 at 9:35 am    Follow-Up: At [facility], you and your health needs are our priority.  As part of our continuing mission to provide you with exceptional heart care, we have created designated Provider Care Teams.  These Care Teams include your primary Cardiologist (physician) and Advanced Practice Providers (APPs -  Physician Assistants and Nurse Practitioners) who all work together to provide you with the care you need, when you need it.   Your next appointment:   1 year(s)  Provider:   Werner Lean, MD

## 2022-06-01 ENCOUNTER — Other Ambulatory Visit: Payer: Self-pay | Admitting: Adult Health

## 2022-06-01 DIAGNOSIS — M545 Low back pain, unspecified: Secondary | ICD-10-CM

## 2022-06-17 ENCOUNTER — Ambulatory Visit (HOSPITAL_COMMUNITY): Payer: BC Managed Care – PPO | Attending: Internal Medicine

## 2022-06-17 DIAGNOSIS — I350 Nonrheumatic aortic (valve) stenosis: Secondary | ICD-10-CM

## 2022-06-17 DIAGNOSIS — I712 Thoracic aortic aneurysm, without rupture, unspecified: Secondary | ICD-10-CM | POA: Insufficient documentation

## 2022-06-17 LAB — ECHOCARDIOGRAM COMPLETE
AR max vel: 1.29 cm2
AV Area VTI: 1.26 cm2
AV Area mean vel: 1.19 cm2
AV Mean grad: 16 mmHg
AV Peak grad: 25.2 mmHg
Ao pk vel: 2.51 m/s
Area-P 1/2: 4.49 cm2
S' Lateral: 4.1 cm

## 2022-06-20 ENCOUNTER — Other Ambulatory Visit: Payer: Self-pay | Admitting: Adult Health

## 2022-06-20 DIAGNOSIS — M109 Gout, unspecified: Secondary | ICD-10-CM

## 2022-07-01 ENCOUNTER — Encounter: Payer: Self-pay | Admitting: Adult Health

## 2022-07-02 ENCOUNTER — Ambulatory Visit: Payer: BC Managed Care – PPO | Admitting: Adult Health

## 2022-07-02 VITALS — BP 128/78 | HR 84 | Temp 98.2°F | Wt 339.0 lb

## 2022-07-02 DIAGNOSIS — J0101 Acute recurrent maxillary sinusitis: Secondary | ICD-10-CM

## 2022-07-02 MED ORDER — FLUTICASONE PROPIONATE 50 MCG/ACT NA SUSP: 2.0000 | Freq: Every day | NASAL | 6 refills | Status: DC

## 2022-07-02 MED ORDER — AMOXICILLIN 500 MG PO CAPS: 500.0000 mg | ORAL_CAPSULE | Freq: Two times a day (BID) | ORAL | 0 refills | Status: AC

## 2022-07-02 NOTE — Progress Notes (Signed)
Subjective:    Patient ID: Matthew Savage, male    DOB: [birth year], 62 y.o.   MRN: [id number]  HPI 62 year old male who  has a past medical history of Blood in urine, Class 2 obesity (09/30/2020), DDD (degenerative disc disease), DVT (deep venous thrombosis) ([place]) (2017 & 2018), Gout, Heart murmur, Hyperlipidemia, Hypertension, Numbness and tingling, and SCC (squamous cell carcinoma).  He presents to the office today for follow up after being seen about two months ago.  When he was seen 2 months ago he was experiencing a productive cough with green sputum and sore throat.  He did not have any fevers, chills, wheezing/shortness of breath, sinus pain or pressure.  He does have a history of COPD.  He was treated with a 10-day course of doxycycline for suspected respiratory infection.  Today he reports that his symptoms have continued and never went away with the doxycycline.  He continues to have a productive cough with green sputum and now has developed some sinus pressure and ear pain.  At home he has been using DayQuil NyQuil which helps temporarily but does not resolve his symptoms.  Continues to deny fevers, chills, shortness of breath, or wheezing.   Review of Systems See HPI   Past Medical History:  Diagnosis Date   Blood in urine    Class 2 obesity 09/30/2020   DDD (degenerative disc disease)    DVT (deep venous thrombosis) ([place]) 2017 & 2018   Gout    Heart murmur    Hyperlipidemia    Hypertension    Numbness and tingling    right arm and hand   SCC (squamous cell carcinoma)    skin, squamous cell, face    Social History   Socioeconomic History   Marital status: Single    Spouse name: Not on file   Number of children: Not on file   Years of education: Not on file   Highest education level: Associate degree: occupational, Hotel manager, or vocational program  Occupational History   Not on file  Tobacco Use   Smoking status: Former    Packs/day: 2.00    Years: 36.00     Additional pack years: 0.00    Total pack years: 72.00    Types: Cigarettes    Quit date: 01/01/2020    Years since quitting: 2.5   Smokeless tobacco: Never  Substance and Sexual Activity   Alcohol use: Yes    Alcohol/week: 12.0 standard drinks of alcohol    Types: 12 Standard drinks or equivalent per week    Comment: 2 per day   Drug use: No   Sexual activity: Not on file  Other Topics Concern   Not on file  Social History Narrative   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    Married    No kids   Social Determinants of Health   Financial Resource Strain: Low Risk  (07/02/2022)   Overall Financial Resource Strain (CARDIA)    Difficulty of Paying Living Expenses: Not hard at all  Food Insecurity: No Food Insecurity (07/02/2022)   Hunger Vital Sign    Worried About Running Out of Food in the Last Year: Never true    Ran Out of Food in the Last Year: Never true  Transportation Needs: No Transportation Needs (07/02/2022)   PRAPARE - Hydrologist (Medical): No    Lack of Transportation (Non-Medical): No  Physical Activity: Insufficiently Active (07/02/2022)   Exercise  Vital Sign    Days of Exercise per Week: 2 days    Minutes of Exercise per Session: 30 min  Stress: No Stress Concern Present (07/02/2022)   [place]    Feeling of Stress : Not at all  Social Connections: Socially Isolated (07/02/2022)   Social Connection and Isolation Panel [NHANES]    Frequency of Communication with Friends and Family: Once a week    Frequency of Social Gatherings with Friends and Family: Once a week    Attends Religious Services: Never    Marine scientist or Organizations: No    Attends Music therapist: Not on file    Marital Status: Divorced  Intimate Partner Violence: Not on file    Past Surgical History:  Procedure Laterality Date   A-FLUTTER ABLATION N/A 03/11/2020    Procedure: A-FLUTTER ABLATION;  Surgeon: Deboraha Sprang, MD;  Location: [facility];  Service: Cardiovascular;  Laterality: N/A;   APPLICATION OF WOUND VAC  10/18/2020   Procedure: APPLICATION OF WOUND VAC;  Surgeon: Lajuana Matte, MD;  Location: [facility];  Service: Thoracic;;   ASD REPAIR N/A 10/03/2020   Procedure: ATRIAL SEPTAL DEFECT (ASD) REPAIR WITH PERI GUARD PERICARDIUM;  Surgeon: Wonda Olds, MD;  Location: [facility];  Service: Open Heart Surgery;  Laterality: N/A;   CLIPPING OF ATRIAL APPENDAGE Left 10/03/2020   Procedure: CLIPPING OF ATRIAL APPENDAGE WITH ATRICURE 48 CLIP;  Surgeon: Wonda Olds, MD;  Location: [facility];  Service: Open Heart Surgery;  Laterality: Left;   CORONARY ARTERY BYPASS GRAFT N/A 10/03/2020   Procedure: CORONARY ARTERY BYPASS GRAFTING (CABG) X FOUR, USING BILATERAL MAMMARY ARTERIES AND RIGHT ENDOSCOPIC GREATER SAPHENOUS VEIN CONDUITS;  Surgeon: Wonda Olds, MD;  Location: [facility];  Service: Open Heart Surgery;  Laterality: N/A;   HERNIA REPAIR  1967   X 2   IR THORACENTESIS ASP PLEURAL SPACE W/IMG GUIDE  10/28/2020   LAPAROSCOPIC GASTRIC BANDING  03/2009   in [place], [place] CATH AND CORONARY ANGIOGRAPHY N/A 10/01/2020   Procedure: LEFT HEART CATH AND CORONARY ANGIOGRAPHY;  Surgeon: Troy Sine, MD;  Location: [facility];  Service: Cardiovascular;  Laterality: N/A;   MASS EXCISION  12/03/2011   X 2 (gluteal) X 1 (lower back)   MOHS SURGERY     x 2   STERNAL WOUND DEBRIDEMENT N/A 10/18/2020   Procedure: STERNAL WOUND DEBRIDEMENT;  Surgeon: Lajuana Matte, MD;  Location: [facility];  Service: Thoracic;  Laterality: N/A;   STERNAL WOUND DEBRIDEMENT N/A 10/23/2020   Procedure: WOUND VAC CHANGE with APPLICATION OF MYRIAD MORCELLS;  Surgeon: Wonda Olds, MD;  Location: [facility];  Service: Thoracic;  Laterality: N/A;   TEE WITHOUT CARDIOVERSION N/A 10/03/2020   Procedure: TRANSESOPHAGEAL ECHOCARDIOGRAM (TEE);  Surgeon: Wonda Olds, MD;  Location: [facility] [place] [facility];  Service: Open Heart Surgery;  Laterality: N/A;    Family History  Problem Relation Age of Onset   Breast cancer Mother    Other Mother        small cell carcinoma   Hypertension Father    Colon polyps Father    Colon polyps Brother     Allergies  Allergen Reactions   Levaquin [Levofloxacin]     Aortic Aneurysm    Current Outpatient Medications on File Prior to Visit  Medication Sig Dispense Refill   acetaminophen (TYLENOL) 325 MG tablet Take 2  tablets (650 mg total) by mouth every 6 (six) hours as needed for fever.     albuterol (VENTOLIN HFA) 108 (90 Base) MCG/ACT inhaler Inhale 2 puffs into the lungs every 6 (six) hours as needed for wheezing or shortness of breath. 8 g 2   allopurinol (ZYLOPRIM) 300 MG tablet TAKE 1 TABLET BY MOUTH EVERY DAY 90 tablet 0   amiodarone (PACERONE) 200 MG tablet TAKE 1 TABLET BY MOUTH EVERY DAY 90 tablet 2   atorvastatin (LIPITOR) 80 MG tablet TAKE 1 TABLET BY MOUTH EVERY DAY 90 tablet 3   cetirizine (ZYRTEC) 10 MG tablet Take 10 mg by mouth at bedtime.     colchicine 0.6 MG tablet Take 0.6 mg by mouth daily as needed (Flair up gout).      cyclobenzaprine (FLEXERIL) 10 MG tablet TAKE 1 TABLET BY MOUTH EVERYDAY AT BEDTIME 30 tablet 3   FARXIGA 10 MG TABS tablet TAKE 1 TABLET BY MOUTH DAILY BEFORE BREAKFAST. 90 tablet 3   ferrous [id number] C-folic acid (FEROCON) capsule Take 1 capsule by mouth daily.     metoprolol succinate (TOPROL-XL) 25 MG 24 hr tablet TAKE 1 TABLET BY MOUTH EVERY DAY WITH OR IMMEDIATELY FOLLOWING A MEAL 90 tablet 2   potassium chloride SA (KLOR-CON M) 20 MEQ tablet Take 1 tablet (20 mEq total) by mouth daily. 90 tablet 3   torsemide (DEMADEX) 20 MG tablet Take 2 tablets (40 mg total) by mouth daily. 180 tablet 3   valACYclovir (VALTREX) 500 MG tablet Take 500 mg by mouth daily.     XARELTO 20 MG TABS tablet TAKE 1 TABLET BY MOUTH DAILY WITH SUPPER. 30 tablet 0   No current  facility-administered medications on file prior to visit.    BP 128/78   Pulse 84   Temp 98.2 F (36.8 C) (Oral)   Wt (!) 339 lb (153.8 kg)   SpO2 98%   BMI 45.98 kg/m       Objective:   Physical Exam Vitals and nursing note reviewed.  Constitutional:      Appearance: Normal appearance.  HENT:     Nose: Congestion and rhinorrhea present. Rhinorrhea is purulent.     Right Turbinates: Enlarged and swollen.     Left Turbinates: Enlarged and swollen.     Right Sinus: Maxillary sinus tenderness present.     Left Sinus: Maxillary sinus tenderness present.     Mouth/Throat:     Mouth: Mucous membranes are moist.  Cardiovascular:     Rate and Rhythm: Normal rate and regular rhythm.     Pulses: Normal pulses.     Heart sounds: Normal heart sounds.  Pulmonary:     Effort: Pulmonary effort is normal.     Breath sounds: Normal breath sounds.  Musculoskeletal:        General: Normal range of motion.  Skin:    General: Skin is warm and dry.  Neurological:     General: No focal deficit present.     Mental Status: He is alert and oriented to person, place, and time.  Psychiatric:        Mood and Affect: Mood normal.        Behavior: Behavior normal.        Thought Content: Thought content normal.        Judgment: Judgment normal.        Assessment & Plan:  1. Acute recurrent maxillary sinusitis - Will treat with Amoxicillin and Flonase.  - Follow up if  not resolved by the end of abx treatment - amoxicillin (AMOXIL) 500 MG capsule; Take 1 capsule (500 mg total) by mouth 2 (two) times daily for 10 days.  Dispense: 20 capsule; Refill: 0 - fluticasone (FLONASE) 50 MCG/ACT nasal spray; Place 2 sprays into both nostrils daily.  Dispense: 16 g; Refill: 6  Dorothyann Peng, NP

## 2022-07-15 ENCOUNTER — Encounter: Payer: Self-pay | Admitting: Adult Health

## 2022-07-15 ENCOUNTER — Other Ambulatory Visit: Payer: Self-pay | Admitting: Adult Health

## 2022-07-15 MED ORDER — AMOXICILLIN 500 MG PO CAPS: 500.0000 mg | ORAL_CAPSULE | Freq: Two times a day (BID) | ORAL | 0 refills | Status: AC

## 2022-07-15 NOTE — Telephone Encounter (Signed)
Please advise 

## 2022-08-14 ENCOUNTER — Other Ambulatory Visit: Payer: Self-pay | Admitting: Adult Health

## 2022-08-14 ENCOUNTER — Other Ambulatory Visit: Payer: Self-pay | Admitting: Internal Medicine

## 2022-08-14 DIAGNOSIS — I825Z2 Chronic embolism and thrombosis of unspecified deep veins of left distal lower extremity: Secondary | ICD-10-CM

## 2022-08-15 ENCOUNTER — Other Ambulatory Visit: Payer: Self-pay | Admitting: Adult Health

## 2022-08-15 ENCOUNTER — Other Ambulatory Visit: Payer: Self-pay | Admitting: Internal Medicine

## 2022-08-15 DIAGNOSIS — M109 Gout, unspecified: Secondary | ICD-10-CM

## 2022-08-18 IMAGING — DX DG CHEST 1V PORT
1 series · 1 of 1 positions shown · non-contrast
Comparison: 01/22/2020

CLINICAL DATA: Chest pain

EXAM:
PORTABLE CHEST 1 VIEW

[chest ap]
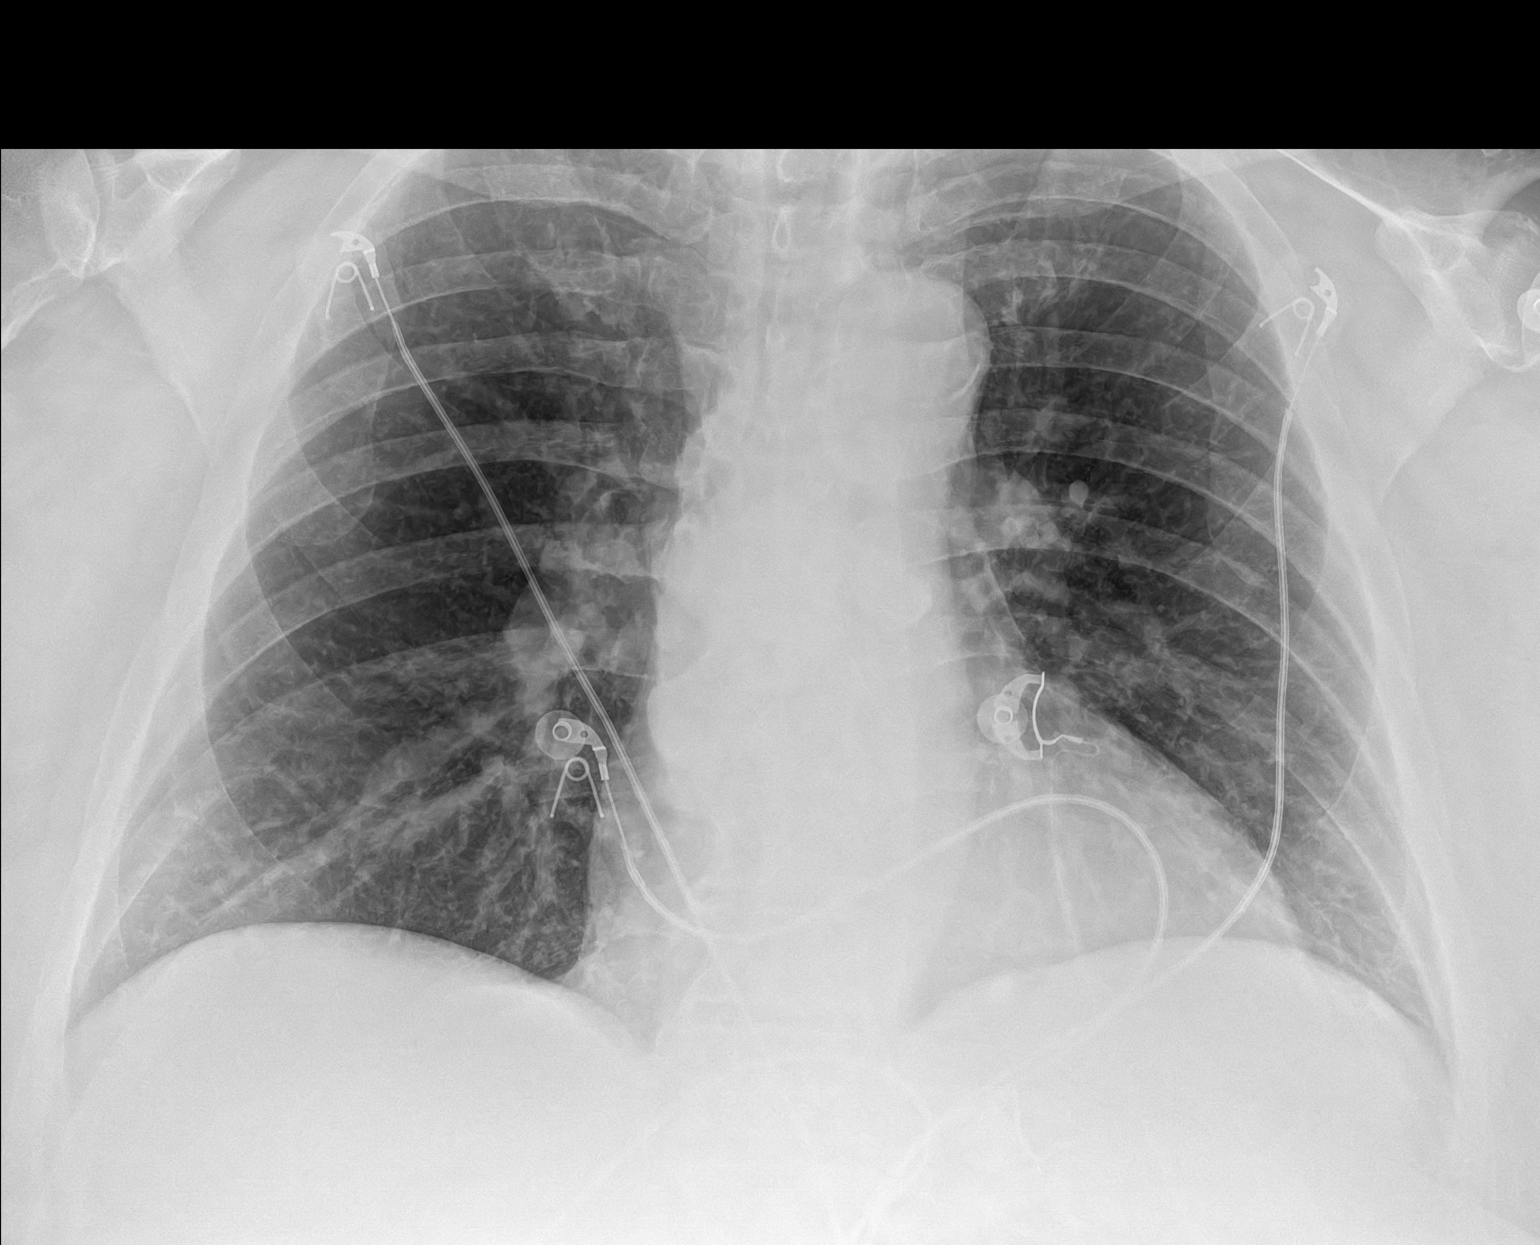

[1 of 1 positions shown; findings below may reference images not displayed]

FINDINGS: Heart and mediastinal contours are within normal limits. No focal
opacities or effusions. No acute bony abnormality.
IMPRESSION: No active disease.
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
## 2022-08-22 IMAGING — DX DG CHEST 1V PORT
1 series · 1 of 1 positions shown · non-contrast
Comparison: 09/29/2020 chest radiograph.

CLINICAL DATA: Intubated, post CABG

EXAM:
PORTABLE CHEST 1 VIEW

[chest ap]
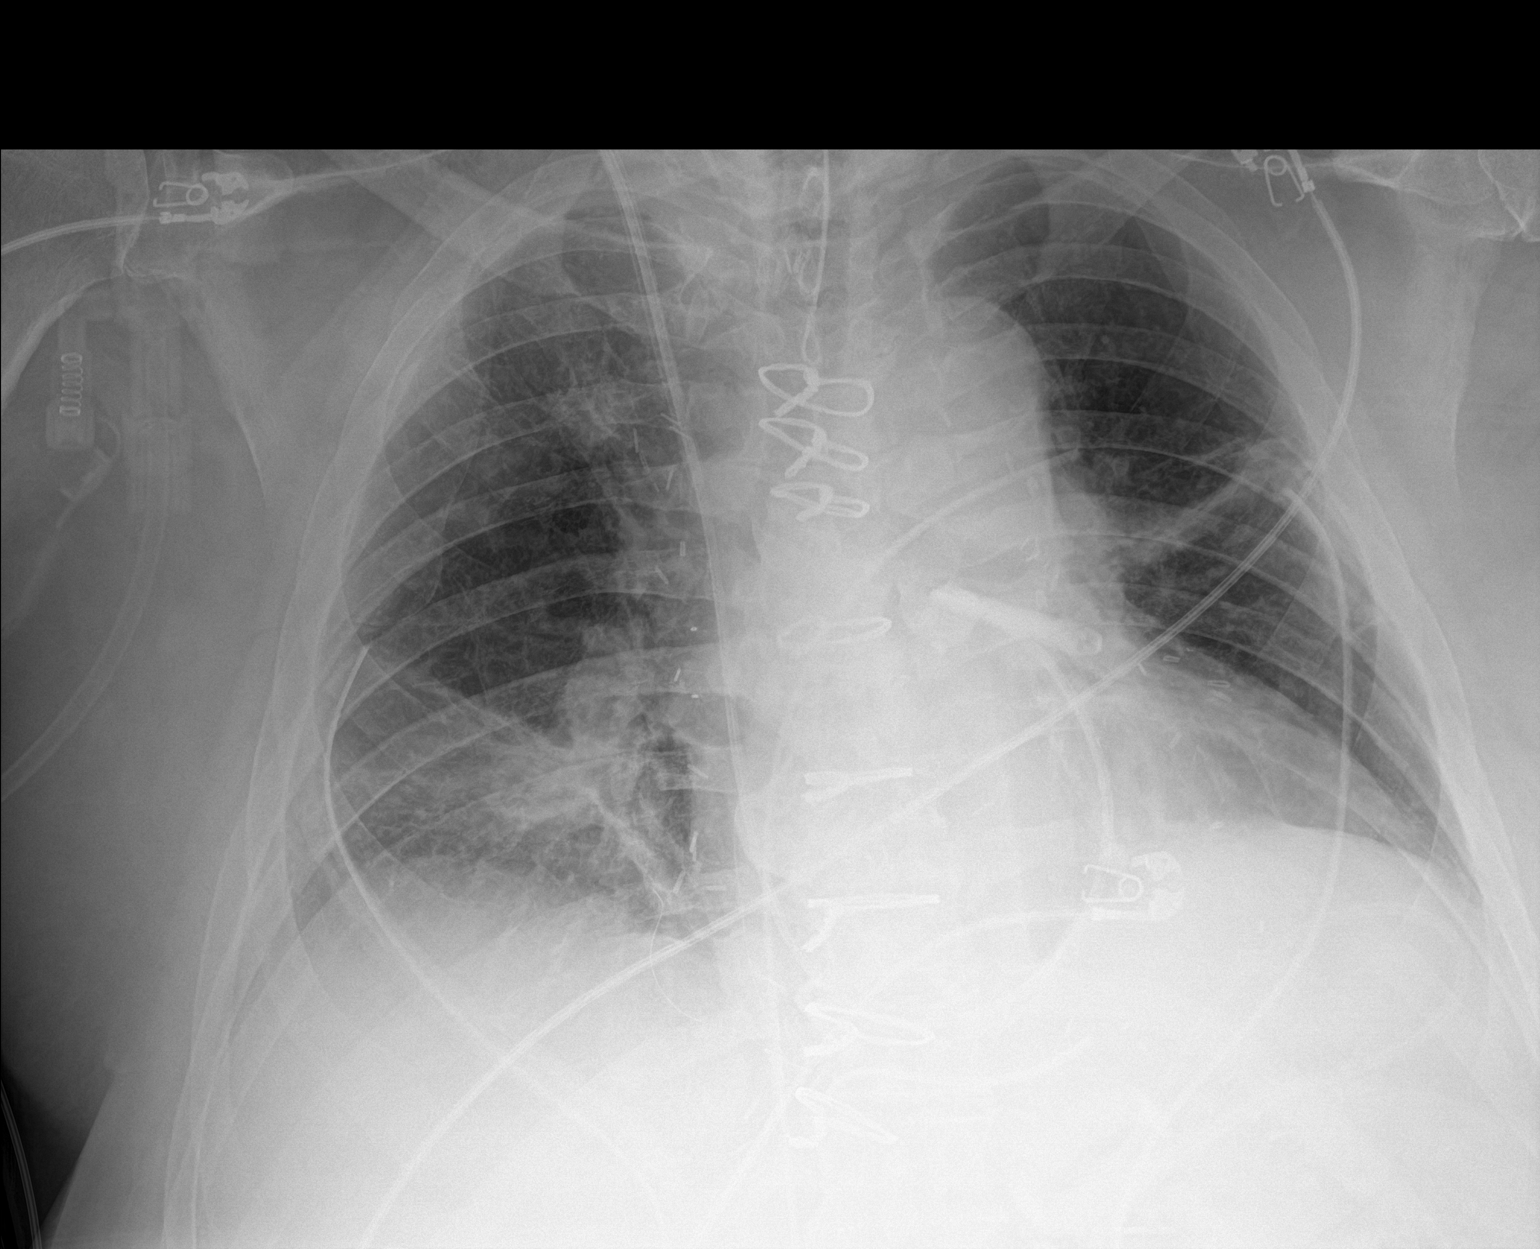

[1 of 1 positions shown; findings below may reference images not displayed]

FINDINGS: Endotracheal tube tip is 4.5 cm above the carina. Right internal
jugular Swan-Ganz catheter terminates over the main pulmonary
artery. Intact sternotomy wires. Two left mediastinal drains and
bilateral basilar chest tubes in place. Stable cardiomediastinal
silhouette with normal heart size. No pneumothorax. No pleural
effusion. Mild platelike atelectasis in the left mid lung and
bibasilar lungs. No pulmonary edema.
IMPRESSION: 1. Well-positioned support structures. No pneumothorax.
2. Mild platelike atelectasis in the left mid lung and bibasilar
lungs.

## 2022-08-25 ENCOUNTER — Encounter: Payer: Self-pay | Admitting: Internal Medicine

## 2022-08-29 IMAGING — DX DG ABD PORTABLE 1V
1 series · 3 of 3 positions shown · non-contrast
Comparison: CT AP 10/09/2020

CLINICAL DATA: Evaluate ileus

EXAM:
PORTABLE ABDOMEN - 1 VIEW

[Series 1: abdomen · 0.14mm/px · 3 of 3 slices shown]
[im 1/3]
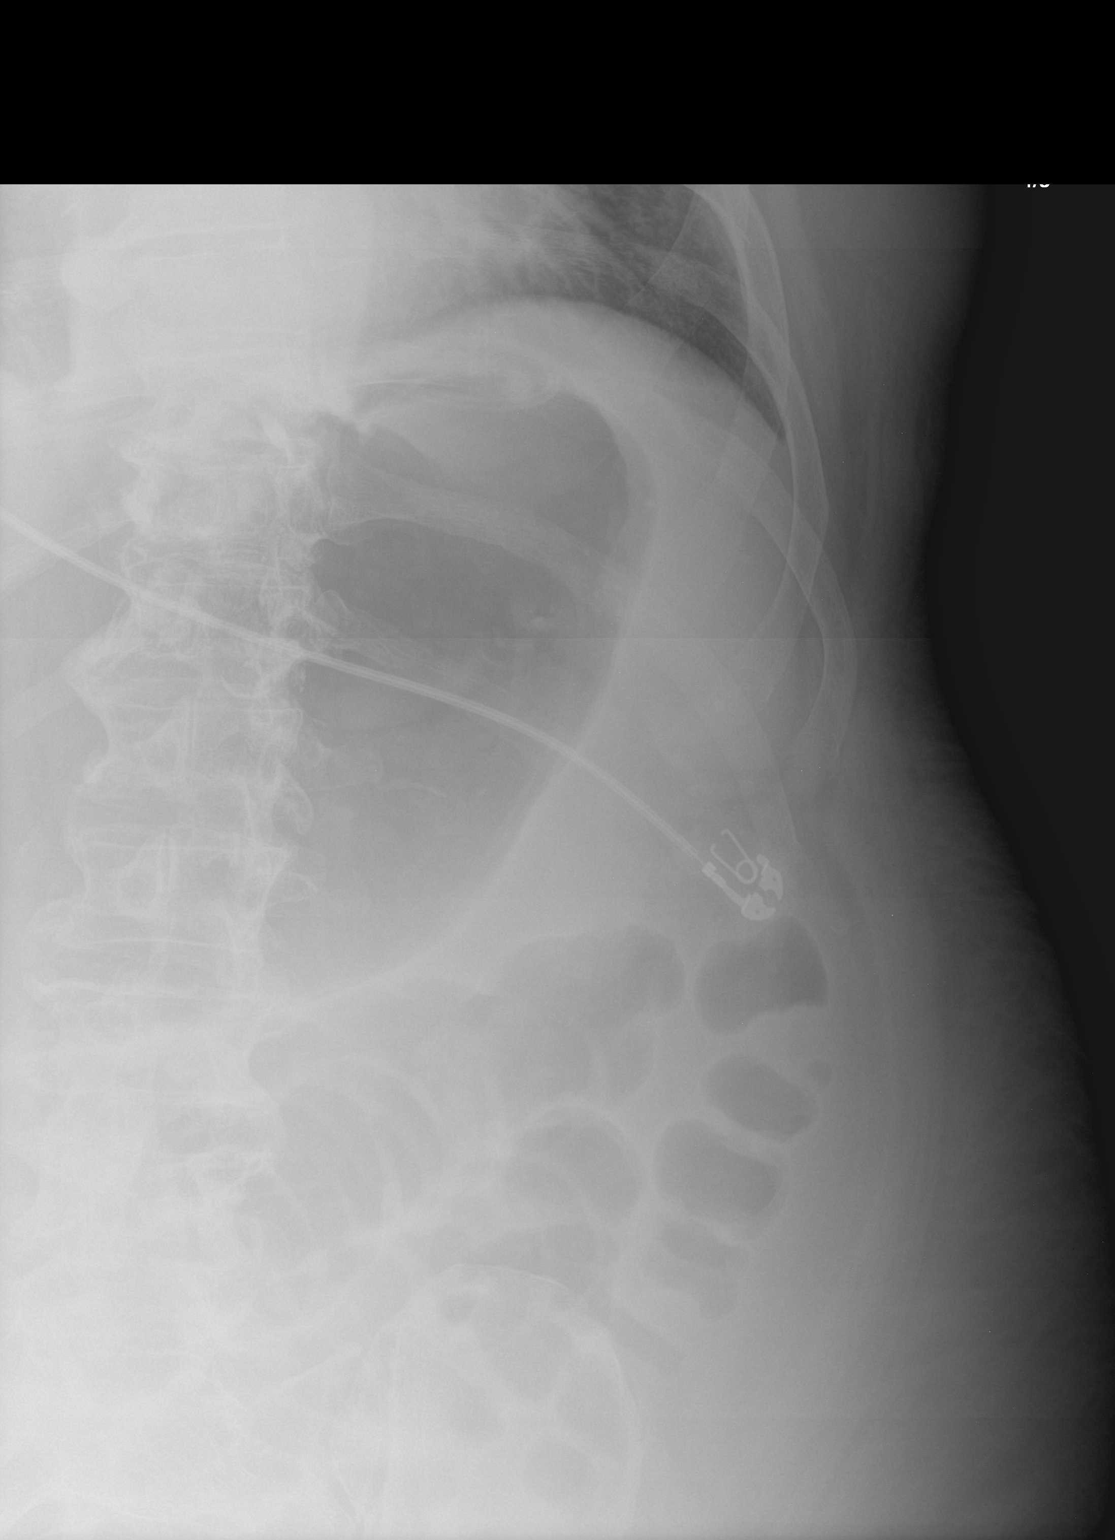
[im 2/3]
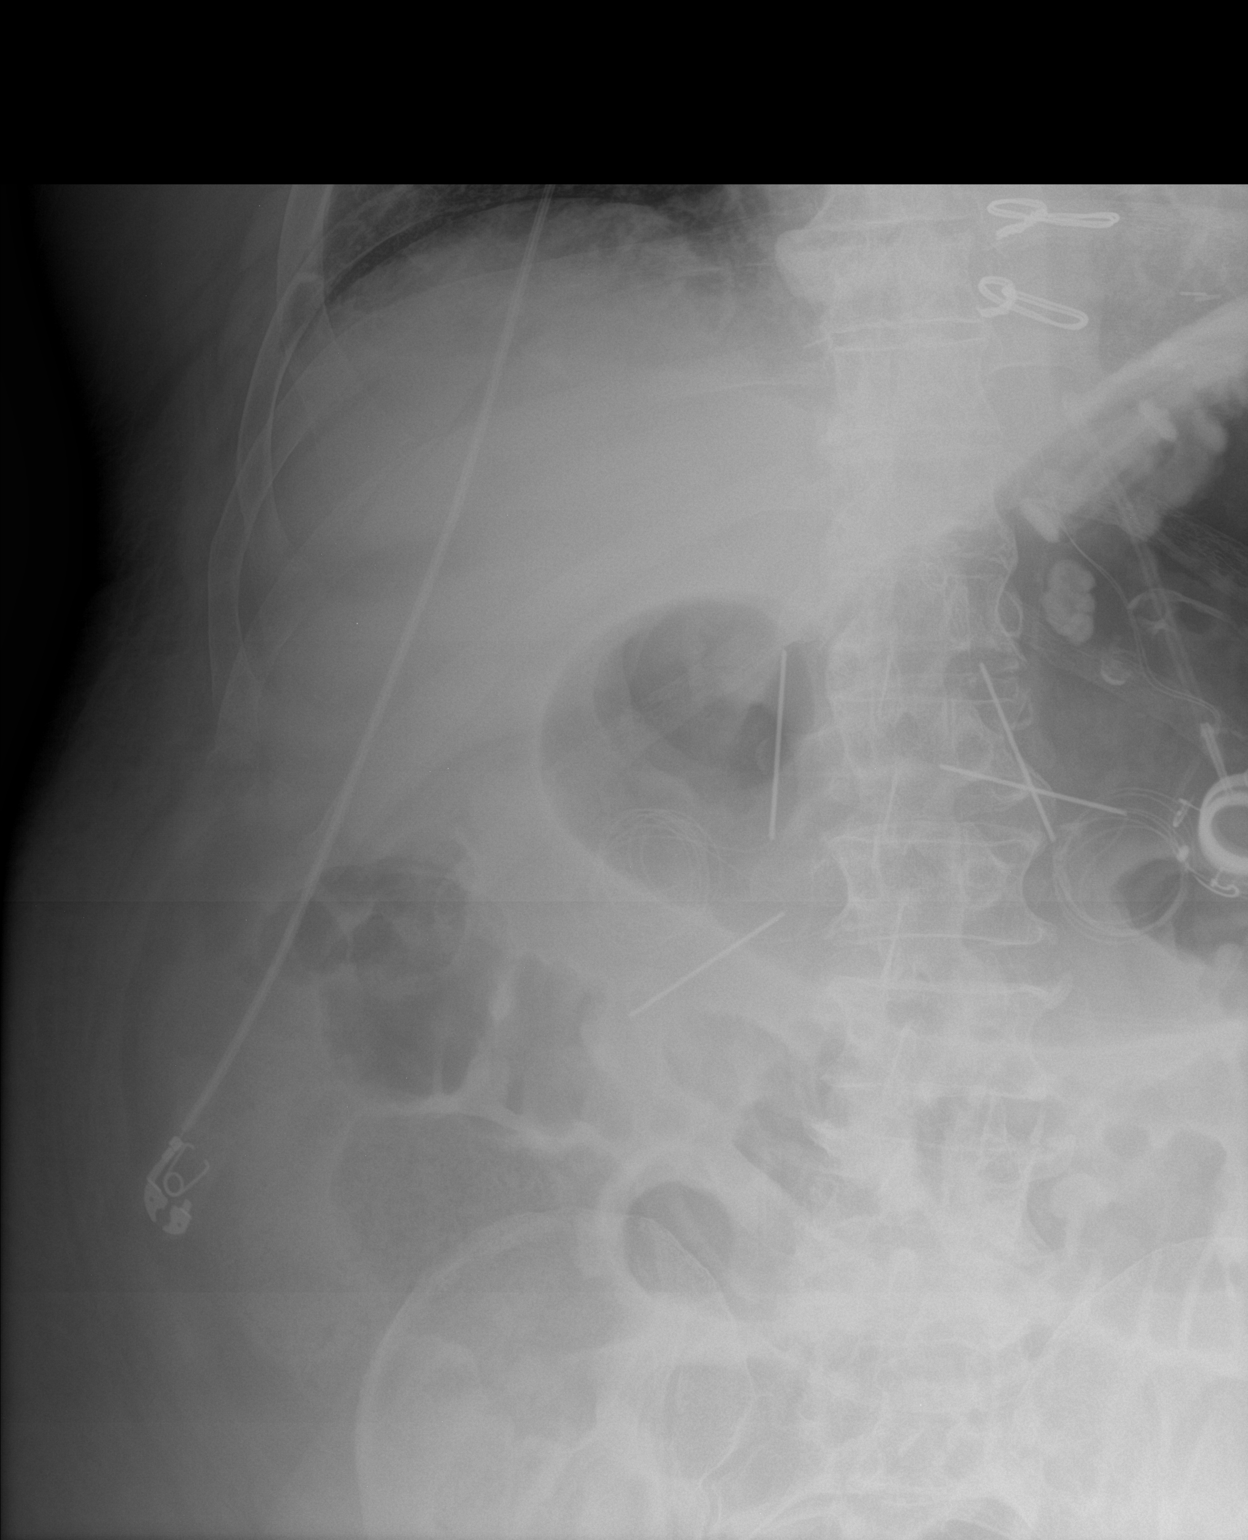
[im 3/3]
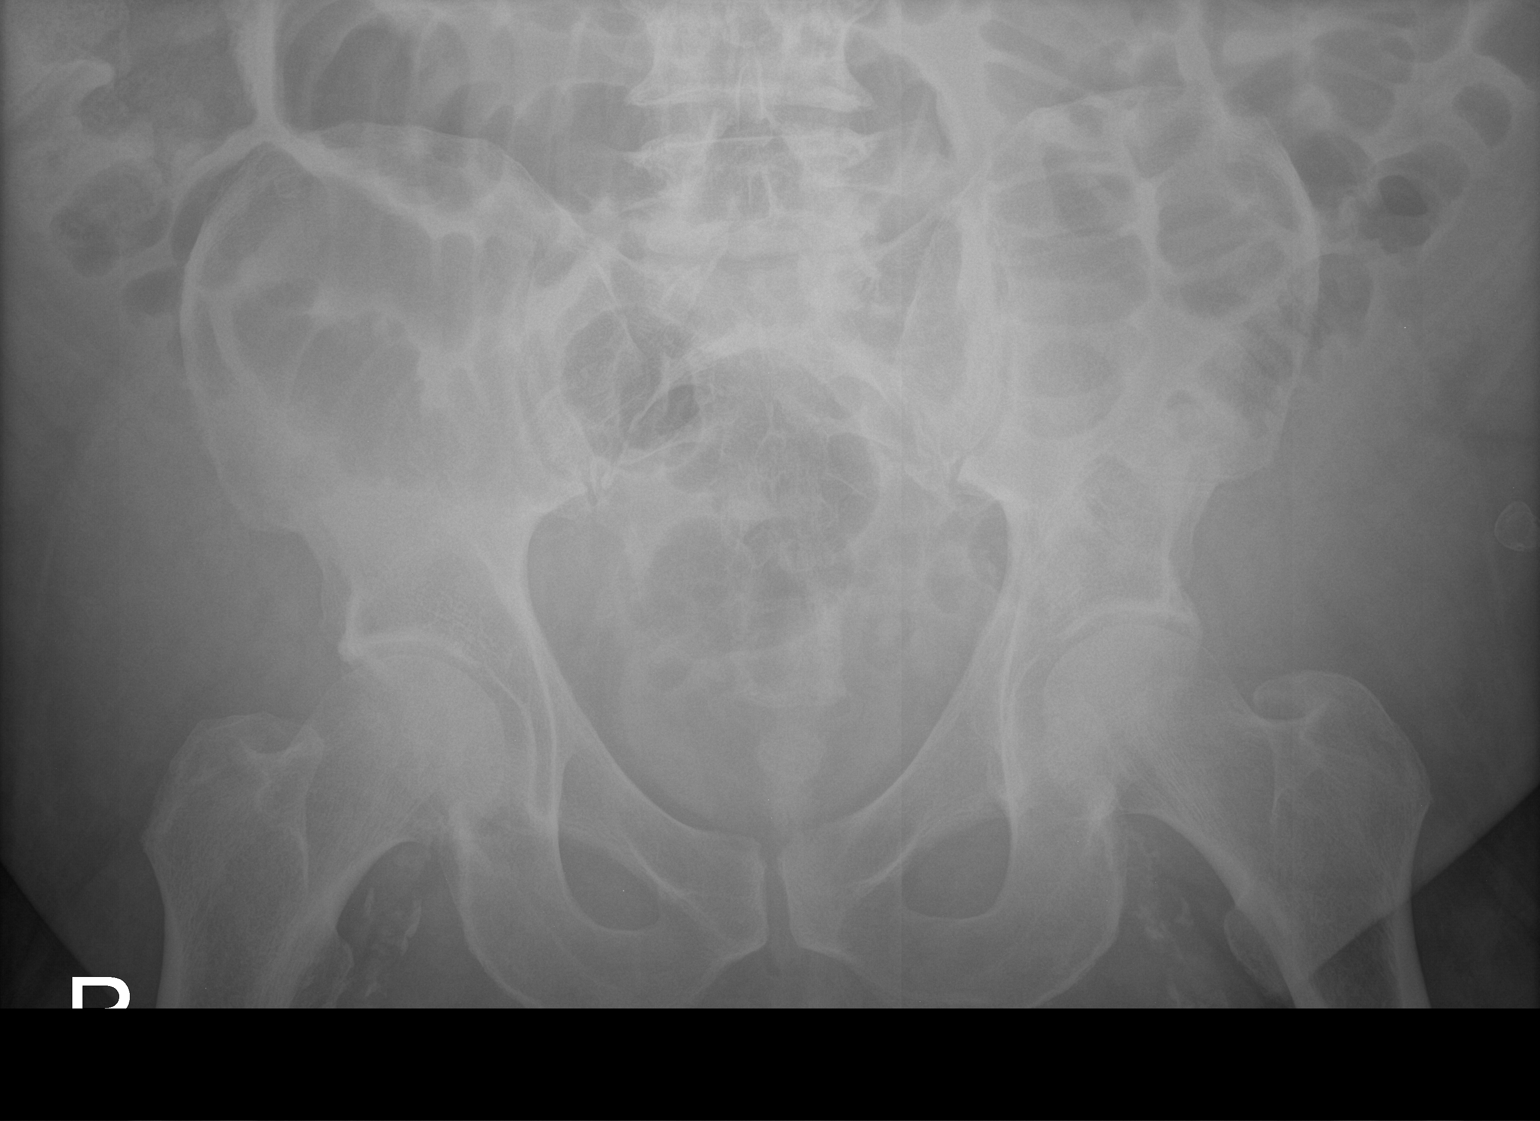

[3 of 3 positions shown; findings below may reference images not displayed]

FINDINGS: Gastric band identified. Gaseous distension of the stomach is again
noted, unchanged. Persistent gaseous distension of the small bowel
loops with gas and stool noted throughout the colon up to the
rectum.
IMPRESSION: Persistent gaseous distension of the stomach and small bowel loops
compatible with either ileus or partial small bowel obstruction.

## 2022-09-04 ENCOUNTER — Encounter: Payer: Self-pay | Admitting: Adult Health

## 2022-09-24 NOTE — Progress Notes (Signed)
  Electrophysiology Office Note:    Date:  09/25/2022   ID:  Matthew Savage, DOB July 03, 1960, MRN [id number]  [facility] Cardiologist:  Christell Constant, MD  [facility] Electrophysiologist:  Sherryl Manges, MD   Referring MD: Shirline Frees, NP   Chief Complaint: Atrial fibrillation  History of Present Illness:    Matthew Savage is a 62 y.o. male who I am seeing today for an evaluation of atrial fibrillation at the request of Dr. Izora Ribas.  The patient has a medical history that includes atrial fibrillation and flutter, hypertension, mild aortic stenosis, hyperlipidemia, tobacco use, morbid obesity.  The patient had a prior A-fib ablation in 2022.  He had a CABG in 2022.  Surgery included an ASD repair and left atrial appendage clip.  His postop course was complicated by sternal wound and bacteremia.  He is on Xarelto now for history of DVT (left atrial appendage clip).  He presents for discussion about repeat ablation possibility in an effort to get off of amiodarone.       Their past medical, social and family history was reveiwed.   ROS:   Please see the history of present illness.    All other systems reviewed and are negative.  EKGs/Labs/Other Studies Reviewed:    The following studies were reviewed today:  November 19, 2021 EKG shows atrial flutter with a QTc of 490 ms, rare PVC  June 17, 2022 echo shows EF 50 to 55%, moderately reduced RV function, mildly dilated right atrium, severely calcified aortic valve leaflets with mild stenosis.  EKG:  The ekg ordered today demonstrates atrial flutter, atypical, ventricular rate 83   Physical Exam:    VS:  BP 126/80   Pulse 83   Ht 6' (1.829 m)   Wt (!) 343 lb (155.6 kg)   SpO2 96%   BMI 46.52 kg/m     Wt Readings from Last 3 Encounters:  09/25/22 (!) 343 lb (155.6 kg)  07/02/22 (!) 339 lb (153.8 kg)  05/21/22 (!) 331 lb (150.1 kg)     GEN:  Well nourished, well developed in no acute distress.   Obese CARDIAC: RRR, no murmurs, rubs, gallops RESPIRATORY:  Clear to auscultation without rales, wheezing or rhonchi       ASSESSMENT AND PLAN:    1. Typical atrial flutter (HCC)   2. HFrEF (heart failure with reduced ejection fraction) (HCC)   3. S/P CABG (coronary artery bypass graft)   4. Mild aortic stenosis by prior echocardiogram   5. Encounter for long-term (current) use of high-risk medication     #Persistent atrial fibrillation and flutter Prior ablation. Not a good catheter ablation candidate given body habitus Plan for cardioversion.  I discussed the cardioversion procedure in detail including the risks and he wishes to proceed.  He has been taking his blood thinner without missed doses for at least 4 weeks.  #Morbid obesity Limits our ability to pursue catheter ablation at this time.  #Coronary artery disease post CABG No ischemic symptoms  #Mild aortic stenosis No signs of valve dysfunction today on physical exam.   Follow-up 3 months with APP.    Signed, Rossie Muskrat. Lalla Brothers, MD, [facility], [facility] 09/25/2022 9:09 AM    Electrophysiology [facility]

## 2022-09-24 NOTE — H&P (View-Only) (Signed)
  Electrophysiology Office Note:    Date:  09/25/2022   ID:  Matthew Savage, DOB 02/27/1961, MRN [id number]  [facility] Cardiologist:  Mahesh A Chandrasekhar, MD  [facility] Electrophysiologist:  Steven Klein, MD   Referring MD: Nafziger, Cory, NP   Chief Complaint: Atrial fibrillation  History of Present Illness:    Matthew Savage is a 62 y.o. male who I am seeing today for an evaluation of atrial fibrillation at the request of Dr. Chandrasekhar.  The patient has a medical history that includes atrial fibrillation and flutter, hypertension, mild aortic stenosis, hyperlipidemia, tobacco use, morbid obesity.  The patient had a prior A-fib ablation in 2022.  He had a CABG in 2022.  Surgery included an ASD repair and left atrial appendage clip.  His postop course was complicated by sternal wound and bacteremia.  He is on Xarelto now for history of DVT (left atrial appendage clip).  He presents for discussion about repeat ablation possibility in an effort to get off of amiodarone.       Their past medical, social and family history was reveiwed.   ROS:   Please see the history of present illness.    All other systems reviewed and are negative.  EKGs/Labs/Other Studies Reviewed:    The following studies were reviewed today:  November 19, 2021 EKG shows atrial flutter with a QTc of 490 ms, rare PVC  June 17, 2022 echo shows EF 50 to 55%, moderately reduced RV function, mildly dilated right atrium, severely calcified aortic valve leaflets with mild stenosis.  EKG:  The ekg ordered today demonstrates atrial flutter, atypical, ventricular rate 83   Physical Exam:    VS:  BP 126/80   Pulse 83   Ht 6' (1.829 m)   Wt (!) 343 lb (155.6 kg)   SpO2 96%   BMI 46.52 kg/m     Wt Readings from Last 3 Encounters:  09/25/22 (!) 343 lb (155.6 kg)  07/02/22 (!) 339 lb (153.8 kg)  05/21/22 (!) 331 lb (150.1 kg)     GEN:  Well nourished, well developed in no acute distress.   Obese CARDIAC: RRR, no murmurs, rubs, gallops RESPIRATORY:  Clear to auscultation without rales, wheezing or rhonchi       ASSESSMENT AND PLAN:    1. Typical atrial flutter (HCC)   2. HFrEF (heart failure with reduced ejection fraction) (HCC)   3. S/P CABG (coronary artery bypass graft)   4. Mild aortic stenosis by prior echocardiogram   5. Encounter for long-term (current) use of high-risk medication     #Persistent atrial fibrillation and flutter Prior ablation. Not a good catheter ablation candidate given body habitus Plan for cardioversion.  I discussed the cardioversion procedure in detail including the risks and he wishes to proceed.  He has been taking his blood thinner without missed doses for at least 4 weeks.  #Morbid obesity Limits our ability to pursue catheter ablation at this time.  #Coronary artery disease post CABG No ischemic symptoms  #Mild aortic stenosis No signs of valve dysfunction today on physical exam.   Follow-up 3 months with APP.    Signed, Erlinda Solinger T. Jahleah Mariscal, MD, FACC, [facility] 09/25/2022 9:09 AM    Electrophysiology [facility] 

## 2022-09-25 ENCOUNTER — Encounter: Payer: Self-pay | Admitting: Cardiology

## 2022-09-25 ENCOUNTER — Other Ambulatory Visit: Payer: Self-pay | Admitting: Adult Health

## 2022-09-25 ENCOUNTER — Ambulatory Visit: Payer: BC Managed Care – PPO | Attending: Cardiology | Admitting: Cardiology

## 2022-09-25 VITALS — BP 126/80 | HR 83 | Ht 72.0 in | Wt 343.0 lb

## 2022-09-25 DIAGNOSIS — I483 Typical atrial flutter: Secondary | ICD-10-CM | POA: Diagnosis not present

## 2022-09-25 DIAGNOSIS — I350 Nonrheumatic aortic (valve) stenosis: Secondary | ICD-10-CM | POA: Diagnosis not present

## 2022-09-25 DIAGNOSIS — M545 Low back pain, unspecified: Secondary | ICD-10-CM

## 2022-09-25 DIAGNOSIS — I5020 Unspecified systolic (congestive) heart failure: Secondary | ICD-10-CM

## 2022-09-25 DIAGNOSIS — Z79899 Other long term (current) drug therapy: Secondary | ICD-10-CM

## 2022-09-25 DIAGNOSIS — Z951 Presence of aortocoronary bypass graft: Secondary | ICD-10-CM

## 2022-09-25 NOTE — Patient Instructions (Addendum)
Medication Instructions:  Your physician recommends that you continue on your current medications as directed. Please refer to the Current Medication list given to you today.  *If you need a refill on your cardiac medications before your next appointment, please call your pharmacy*  Testing/Procedures: Your physician has recommended that you have a Cardioversion (DCCV). Electrical Cardioversion uses a jolt of electricity to your heart either through paddles or wired patches attached to your chest. This is a controlled, usually prescheduled, procedure. Defibrillation is done under light anesthesia in the hospital, and you usually go home the day of the procedure. This is done to get your heart back into a normal rhythm. You are not awake for the procedure. Please see the instruction sheet given to you today.  Follow-Up: At [facility], you and your health needs are our priority.  As part of our continuing mission to provide you with exceptional heart care, we have created designated Provider Care Teams.  These Care Teams include your primary Cardiologist (physician) and Advanced Practice Providers (APPs -  Physician Assistants and Nurse Practitioners) who all work together to provide you with the care you need, when you need it.   Your next appointment:   3 months  Provider:   You will see one of the following Advanced Practice Providers on your designated Care Team:   Francis Dowse, [place]" [place], [place] Sherie Don, NP

## 2022-10-11 NOTE — Pre-Procedure Instructions (Signed)
Patient is scheduled for procedure on Wednesday 7/3.  Procedure is scheduled with anesthesia.  Anesthesia requires certain medications to be held prior to procedure.  Left voicemail Farixga-last dose will be 6/29.

## 2022-10-20 NOTE — Pre-Procedure Instructions (Signed)
Spoke to patient on phone regarding cardioversion tomorrow:  Instructed to arrive at 0745, NPO after midnight  Confirmed patient has a ride home and responsible person to stay with patient for 24 hours after the procedure  Confirmed no missed doses of Xarelto, instructed patient to ensure takes dose tonight  Instructed patient to continue holding [place]

## 2022-10-20 NOTE — Anesthesia Preprocedure Evaluation (Signed)
Anesthesia Evaluation  Patient identified by MRN, date of birth, ID band Patient awake    Reviewed: Allergy & Precautions, NPO status , Patient's Chart, lab work & pertinent test results, reviewed documented beta blocker date and time   History of Anesthesia Complications Negative for: history of anesthetic complications  Airway Mallampati: II  TM Distance: >3 FB Neck ROM: Full    Dental  (+) Dental Advisory Given   Pulmonary sleep apnea and Continuous Positive Airway Pressure Ventilation , COPD,  COPD inhaler, former smoker   Pulmonary exam normal        Cardiovascular hypertension, Pt. on home beta blockers and Pt. on medications + CAD, + CABG and + DVT  Normal cardiovascular exam+ dysrhythmias Atrial Fibrillation + Valvular Problems/Murmurs AS    '24 TTE - EF 50 to 55%. There is mild concentric left ventricular hypertrophy. Grade II diastolic dysfunction (pseudonormalization). Right ventricular systolic function is moderately reduced. The right ventricular size is mildly enlarged. Right atrial size was mildly dilated. Mild mitral valve regurgitation. By visualization, the AV appear severely stenotic; however, there is only mild stenosis by gradients (mean gradient ). Estimated AVA by VTI of 1.26cm2.      Neuro/Psych negative neurological ROS  negative psych ROS   GI/Hepatic Neg liver ROS,,, S/p gastric banding    Endo/Other    Morbid obesity  Renal/GU negative Renal ROS     Musculoskeletal  (+) Arthritis ,    Abdominal   Peds  Hematology  On xarelto    Anesthesia Other Findings   Reproductive/Obstetrics                             Anesthesia Physical Anesthesia Plan  ASA: 4  Anesthesia Plan: General   Post-op Pain Management: Minimal or no pain anticipated   Induction: Intravenous  PONV Risk Score and Plan: 2 and Treatment may vary due to age or medical condition and  Propofol infusion  Airway Management Planned: Natural Airway and Mask  Additional Equipment: None  Intra-op Plan:   Post-operative Plan:   Informed Consent: I have reviewed the patients History and Physical, chart, labs and discussed the procedure including the risks, benefits and alternatives for the proposed anesthesia with the patient or authorized representative who has indicated his/her understanding and acceptance.       Plan Discussed with: CRNA and Anesthesiologist  Anesthesia Plan Comments:        Anesthesia Quick Evaluation

## 2022-10-21 ENCOUNTER — Encounter (HOSPITAL_COMMUNITY): Payer: Self-pay | Admitting: Cardiology

## 2022-10-21 ENCOUNTER — Encounter (HOSPITAL_COMMUNITY)
Admission: RE | Disposition: A | Discharge status: Home / Self Care | Payer: Self-pay | Source: Ambulatory Visit | Attending: Cardiology

## 2022-10-21 ENCOUNTER — Ambulatory Visit (HOSPITAL_COMMUNITY): Payer: BC Managed Care – PPO | Admitting: Anesthesiology

## 2022-10-21 ENCOUNTER — Other Ambulatory Visit: Payer: Self-pay

## 2022-10-21 ENCOUNTER — Ambulatory Visit (HOSPITAL_COMMUNITY)
Admission: RE | Admit: 2022-10-21 | Discharge: 2022-10-21 | Disposition: A | Discharge status: Home / Self Care | Payer: BC Managed Care – PPO | Source: Ambulatory Visit | Attending: Cardiology | Admitting: Cardiology

## 2022-10-21 DIAGNOSIS — I4891 Unspecified atrial fibrillation: Secondary | ICD-10-CM | POA: Diagnosis not present

## 2022-10-21 DIAGNOSIS — I4819 Other persistent atrial fibrillation: Secondary | ICD-10-CM | POA: Diagnosis not present

## 2022-10-21 DIAGNOSIS — Z7901 Long term (current) use of anticoagulants: Secondary | ICD-10-CM | POA: Diagnosis not present

## 2022-10-21 DIAGNOSIS — Z951 Presence of aortocoronary bypass graft: Secondary | ICD-10-CM | POA: Diagnosis not present

## 2022-10-21 DIAGNOSIS — I350 Nonrheumatic aortic (valve) stenosis: Secondary | ICD-10-CM | POA: Diagnosis not present

## 2022-10-21 DIAGNOSIS — I5020 Unspecified systolic (congestive) heart failure: Secondary | ICD-10-CM | POA: Diagnosis not present

## 2022-10-21 DIAGNOSIS — I2510 Atherosclerotic heart disease of native coronary artery without angina pectoris: Secondary | ICD-10-CM | POA: Diagnosis not present

## 2022-10-21 DIAGNOSIS — Z6841 Body Mass Index (BMI) 40.0 and over, adult: Secondary | ICD-10-CM | POA: Insufficient documentation

## 2022-10-21 DIAGNOSIS — I110 Hypertensive heart disease with heart failure: Secondary | ICD-10-CM | POA: Insufficient documentation

## 2022-10-21 DIAGNOSIS — I483 Typical atrial flutter: Secondary | ICD-10-CM | POA: Insufficient documentation

## 2022-10-21 DIAGNOSIS — Z86718 Personal history of other venous thrombosis and embolism: Secondary | ICD-10-CM | POA: Diagnosis not present

## 2022-10-21 DIAGNOSIS — Z01818 Encounter for other preprocedural examination: Secondary | ICD-10-CM

## 2022-10-21 HISTORY — PX: CARDIOVERSION: SHX1299

## 2022-10-21 LAB — POCT I-STAT, CHEM 8
BUN: 42 mg/dL — ABNORMAL HIGH (ref 8–23)
Calcium, Ion: 1.27 mmol/L (ref 1.15–1.40)
Chloride: 104 mmol/L (ref 98–111)
Creatinine, Ser: 2 mg/dL — ABNORMAL HIGH (ref 0.61–1.24)
Glucose, Bld: 106 mg/dL — ABNORMAL HIGH (ref 70–99)
HCT: 40 % (ref 39.0–52.0)
Hemoglobin: 13.6 g/dL (ref 13.0–17.0)
Potassium: 4.7 mmol/L (ref 3.5–5.1)
Sodium: 138 mmol/L (ref 135–145)
TCO2: 28 mmol/L (ref 22–32)

## 2022-10-21 SURGERY — CARDIOVERSION
Anesthesia: General

## 2022-10-21 MED ORDER — SODIUM CHLORIDE 0.9 % IV SOLN: INTRAVENOUS | Status: DC

## 2022-10-21 MED ORDER — LIDOCAINE HCL (CARDIAC) PF 100 MG/5ML IV SOSY
PREFILLED_SYRINGE | INTRAVENOUS | Status: DC | PRN
  Administered 2022-10-21: 40 mg via INTRATRACHEAL

## 2022-10-21 MED ORDER — PROPOFOL 10 MG/ML IV BOLUS
INTRAVENOUS | Status: DC | PRN
  Administered 2022-10-21 (×3): 50 mg via INTRAVENOUS

## 2022-10-21 SURGICAL SUPPLY — 1 items: ELECT DEFIB PAD ADLT CADENCE (PAD) ×1 IMPLANT

## 2022-10-21 NOTE — Anesthesia Postprocedure Evaluation (Signed)
Anesthesia Post Note  Patient: Matthew Savage  Procedure(s) Performed: CARDIOVERSION     Patient location during evaluation: PACU Anesthesia Type: General Level of consciousness: awake and alert Pain management: pain level controlled Vital Signs Assessment: post-procedure vital signs reviewed and stable Respiratory status: spontaneous breathing, nonlabored ventilation and respiratory function stable Cardiovascular status: stable and blood pressure returned to baseline Anesthetic complications: no   No notable events documented.  Last Vitals:  Vitals:   10/21/22 0759 10/21/22 0921  BP:    Pulse: 79   Resp: 14   Temp:  36.9 C  SpO2: 97%     Last Pain:  Vitals:   10/21/22 0921  TempSrc: Temporal  PainSc: 0-No pain                 Beryle Lathe

## 2022-10-21 NOTE — Discharge Instructions (Signed)

## 2022-10-21 NOTE — Transfer of Care (Signed)
Immediate Anesthesia Transfer of Care Note  Patient: Matthew Savage  Procedure(s) Performed: CARDIOVERSION  Patient Location: Cath Lab  Anesthesia Type:General  Level of Consciousness: drowsy and patient cooperative  Airway & Oxygen Therapy: Patient Spontanous Breathing and Patient connected to nasal cannula oxygen  Post-op Assessment: Report given to RN and Post -op Vital signs reviewed and stable  Post vital signs: Reviewed and stable  Last Vitals:  Vitals Value Taken Time  BP 164/92 10/21/22 0906  Temp    Pulse 77 10/21/22 0906  Resp 15 10/21/22 0906  SpO2 98 % 10/21/22 0906  Vitals shown include unvalidated device data.  Last Pain:  Vitals:   10/21/22 0759  TempSrc:   PainSc: 0-No pain         Complications: No notable events documented.

## 2022-10-21 NOTE — CV Procedure (Signed)
Procedure:   DCCV  Indication:  Symptomatic atrial flutter  Procedure Note:  The patient signed informed consent.  They have had had therapeutic anticoagulation with rivaroxaban greater than 3 weeks.  Anesthesia was administered by Dr. Mal Amabile.  Patient received 60 mg IV lidocaine and 150 mg IV propofol.Adequate airway was maintained throughout and vital followed per protocol.  They were cardioverted x 1 with 150J of biphasic synchronized energy.  They converted to NSR.  There were no apparent complications.  The patient had normal neuro status and respiratory status post procedure with vitals stable as recorded elsewhere.    Follow up:  They will continue on current medical therapy and follow up with cardiology as scheduled.  Jodelle Red, MD PhD 10/21/2022 9:17 AM

## 2022-10-21 NOTE — Interval H&P Note (Signed)
History and Physical Interval Note:  10/21/2022 8:26 AM  Matthew Savage  has presented today for surgery, with the diagnosis of AFIB.  The various methods of treatment have been discussed with the patient and family. After consideration of risks, benefits and other options for treatment, the patient has consented to  Procedure(s): CARDIOVERSION (N/A) as a surgical intervention.  The patient's history has been reviewed, patient examined, no change in status, stable for surgery.  I have reviewed the patient's chart and labs.  Questions were answered to the patient's satisfaction.     Jeniffer Culliver Cristal Deer

## 2022-10-23 ENCOUNTER — Encounter (HOSPITAL_COMMUNITY): Payer: Self-pay | Admitting: Cardiology

## 2022-11-11 ENCOUNTER — Other Ambulatory Visit: Payer: Self-pay | Admitting: Internal Medicine

## 2022-11-19 ENCOUNTER — Other Ambulatory Visit: Payer: Self-pay | Admitting: Internal Medicine

## 2022-11-19 ENCOUNTER — Other Ambulatory Visit: Payer: Self-pay | Admitting: Adult Health

## 2022-11-19 DIAGNOSIS — I4891 Unspecified atrial fibrillation: Secondary | ICD-10-CM

## 2022-11-19 DIAGNOSIS — J0101 Acute recurrent maxillary sinusitis: Secondary | ICD-10-CM

## 2022-11-19 DIAGNOSIS — I10 Essential (primary) hypertension: Secondary | ICD-10-CM

## 2022-11-19 DIAGNOSIS — M109 Gout, unspecified: Secondary | ICD-10-CM

## 2022-12-03 ENCOUNTER — Encounter (INDEPENDENT_AMBULATORY_CARE_PROVIDER_SITE_OTHER): Payer: Self-pay

## 2023-01-14 ENCOUNTER — Encounter: Payer: Self-pay | Admitting: Adult Health

## 2023-01-14 ENCOUNTER — Ambulatory Visit: Payer: BC Managed Care – PPO | Admitting: Adult Health

## 2023-01-14 VITALS — BP 110/70 | HR 81 | Temp 97.8°F | Ht 71.0 in | Wt 340.0 lb

## 2023-01-14 DIAGNOSIS — M1A9XX0 Chronic gout, unspecified, without tophus (tophi): Secondary | ICD-10-CM | POA: Diagnosis not present

## 2023-01-14 DIAGNOSIS — I10 Essential (primary) hypertension: Secondary | ICD-10-CM

## 2023-01-14 DIAGNOSIS — E669 Obesity, unspecified: Secondary | ICD-10-CM

## 2023-01-14 DIAGNOSIS — E782 Mixed hyperlipidemia: Secondary | ICD-10-CM

## 2023-01-14 DIAGNOSIS — I483 Typical atrial flutter: Secondary | ICD-10-CM | POA: Diagnosis not present

## 2023-01-14 DIAGNOSIS — I252 Old myocardial infarction: Secondary | ICD-10-CM

## 2023-01-14 DIAGNOSIS — Z8489 Family history of other specified conditions: Secondary | ICD-10-CM

## 2023-01-14 DIAGNOSIS — Z86718 Personal history of other venous thrombosis and embolism: Secondary | ICD-10-CM

## 2023-01-14 DIAGNOSIS — Z1211 Encounter for screening for malignant neoplasm of colon: Secondary | ICD-10-CM

## 2023-01-14 DIAGNOSIS — Z0000 Encounter for general adult medical examination without abnormal findings: Secondary | ICD-10-CM

## 2023-01-14 DIAGNOSIS — G4733 Obstructive sleep apnea (adult) (pediatric): Secondary | ICD-10-CM

## 2023-01-14 LAB — CBC
HCT: 40.9 % (ref 39.0–52.0)
Hemoglobin: 13.4 g/dL (ref 13.0–17.0)
MCHC: 32.7 g/dL (ref 30.0–36.0)
MCV: 111.6 fl — ABNORMAL HIGH (ref 78.0–100.0)
Platelets: 225 10*3/uL (ref 150.0–400.0)
RBC: 3.66 Mil/uL — ABNORMAL LOW (ref 4.22–5.81)
RDW: 15.8 % — ABNORMAL HIGH (ref 11.5–15.5)
WBC: 5.8 10*3/uL (ref 4.0–10.5)

## 2023-01-14 LAB — COMPREHENSIVE METABOLIC PANEL
ALT: 29 U/L (ref 0–53)
AST: 22 U/L (ref 0–37)
Albumin: 4.2 g/dL (ref 3.5–5.2)
Alkaline Phosphatase: 87 U/L (ref 39–117)
BUN: 25 mg/dL — ABNORMAL HIGH (ref 6–23)
CO2: 27 mEq/L (ref 19–32)
Calcium: 9.3 mg/dL (ref 8.4–10.5)
Chloride: 101 mEq/L (ref 96–112)
Creatinine, Ser: 1.76 mg/dL — ABNORMAL HIGH (ref 0.40–1.50)
GFR: 40.95 mL/min — ABNORMAL LOW (ref >60.00)
Glucose, Bld: 104 mg/dL — ABNORMAL HIGH (ref 70–99)
Potassium: 4.4 mEq/L (ref 3.5–5.1)
Sodium: 137 mEq/L (ref 135–145)
Total Bilirubin: 0.8 mg/dL (ref 0.2–1.2)
Total Protein: 7 g/dL (ref 6.0–8.3)

## 2023-01-14 LAB — LIPID PANEL
Cholesterol: 152 mg/dL (ref 0–200)
HDL: 70.1 mg/dL (ref >39.00)
LDL Cholesterol: 45 mg/dL (ref 0–99)
NonHDL: 81.81
Total CHOL/HDL Ratio: 2
Triglycerides: 186 mg/dL — ABNORMAL HIGH (ref 0.0–149.0)
VLDL: 37.2 mg/dL (ref 0.0–40.0)

## 2023-01-14 LAB — TSH: TSH: 2.14 u[IU]/mL (ref 0.35–5.50)

## 2023-01-14 LAB — PSA: PSA: 0.44 ng/mL (ref 0.10–4.00)

## 2023-01-14 LAB — HEMOGLOBIN A1C: Hgb A1c MFr Bld: 5.5 % (ref 4.6–6.5)

## 2023-01-14 MED ORDER — WEGOVY 0.25 MG/0.5ML ~~LOC~~ SOAJ: 0.2500 mg | SUBCUTANEOUS | 0 refills | Status: AC

## 2023-01-14 NOTE — Progress Notes (Signed)
Subjective:    Patient ID: Matthew Savage, male    DOB: 08-23-1960, 62 y.o.   MRN: [id number]  HPI  Patient presents for yearly preventative medicine examination. He is a pleasant 62 year old male who  has a past medical history of Blood in urine, Class 2 obesity (09/30/2020), DDD (degenerative disc disease), DVT (deep venous thrombosis) (HCC) (2017 & 2018), Gout, Heart murmur, Hyperlipidemia, Hypertension, Numbness and tingling, and SCC (squamous cell carcinoma).  Hypertension -currently prescribed Entresto 49-51 mg he denies dizziness, lightheadedness, chest pain, or shortness of breath BP Readings from Last 3 Encounters:  01/14/23 110/70  10/21/22 (!) 159/94  09/25/22 126/80   H/o DVT -2017 and 2018.  Is currently on lifelong Xarelto therapy.  He denies bleeding, shortness of breath, or chest pain  Gout-takes allopurinol 300 mg daily and colchicine as needed for acute flares.  He has not had an acute flare in some time  Tobacco use-successfully quit smoking  A Fib/A flutter/ History of NSTEMi-followed by cardiology, currently on Xarelto and Toprol 50 mg extended release.  Had an ablation in November 2021 and again in 10/2022.   History of bariatric surgery-LAP-BAND in 2010 in [place]. He has seen Bariatric Surgery in [place] and had his sleeve adjusted. He has not been losing weight   Wt Readings from Last 3 Encounters:  01/14/23 (!) 340 lb (154.2 kg)  10/21/22 (!) 343 lb (155.6 kg)  09/25/22 (!) 343 lb (155.6 kg)    OSA- uses CPAP nightly.  Denies any issues with mask fit.  Wakes up feeling refreshed and no daytime somnolence.  Hyperlipidemia - takes lipitor 40 mg. Denies myalgia or fatigue  Lab Results  Component Value Date   CHOL 114 07/08/2020   HDL 50 07/08/2020   LDLCALC 49 07/08/2020   LDLDIRECT 135.1 04/06/2011   TRIG 74 07/08/2020   CHOLHDL 2.3 07/08/2020    All immunizations and health maintenance protocols were reviewed with the patient and needed orders  were placed. Refuses vaccinations   Appropriate screening laboratory values were ordered for the patient including screening of hyperlipidemia, renal function and hepatic function. If indicated by BPH, a PSA was ordered.  Medication reconciliation,  past medical history, social history, problem list and allergies were reviewed in detail with the patient  Goals were established with regard to weight loss, exercise, and  diet in compliance with medications Wt Readings from Last 3 Encounters:  01/14/23 (!) 340 lb (154.2 kg)  10/21/22 (!) 343 lb (155.6 kg)  09/25/22 (!) 343 lb (155.6 kg)   He is over due for his colonoscopy    Review of Systems  Constitutional: Negative.   HENT: Negative.    Eyes: Negative.   Respiratory: Negative.    Cardiovascular:  Positive for leg swelling.  Gastrointestinal: Negative.   Endocrine: Negative.   Genitourinary: Negative.   Musculoskeletal:  Positive for arthralgias.  Skin:  Positive for color change.  Allergic/Immunologic: Negative.   Neurological: Negative.   Hematological: Negative.   Psychiatric/Behavioral: Negative.    All other systems reviewed and are negative.  Past Medical History:  Diagnosis Date   Blood in urine    Class 2 obesity 09/30/2020   DDD (degenerative disc disease)    DVT (deep venous thrombosis) (HCC) 2017 & 2018   Gout    Heart murmur    Hyperlipidemia    Hypertension    Numbness and tingling    right arm and hand   SCC (squamous cell carcinoma)  skin, squamous cell, face    Social History   Socioeconomic History   Marital status: Single    Spouse name: Not on file   Number of children: Not on file   Years of education: Not on file   Highest education level: Associate degree: occupational, Scientist, product/process development, or vocational program  Occupational History   Not on file  Tobacco Use   Smoking status: Former    Current packs/day: 0.00    Average packs/day: 2.0 packs/day for 36.0 years (72.0 ttl pk-yrs)    Types:  Cigarettes    Start date: 01/01/1984    Quit date: 01/01/2020    Years since quitting: 3.0   Smokeless tobacco: Never  Substance and Sexual Activity   Alcohol use: Yes    Alcohol/week: 12.0 standard drinks of alcohol    Types: 12 Standard drinks or equivalent per week    Comment: 2 per day   Drug use: No   Sexual activity: Not on file  Other Topics Concern   Not on file  Social History Narrative   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    Married    No kids   Social Determinants of Health   Financial Resource Strain: Low Risk  (07/02/2022)   Overall Financial Resource Strain (CARDIA)    Difficulty of Paying Living Expenses: Not hard at all  Food Insecurity: No Food Insecurity (07/02/2022)   Hunger Vital Sign    Worried About Running Out of Food in the Last Year: Never true    Ran Out of Food in the Last Year: Never true  Transportation Needs: No Transportation Needs (07/02/2022)   PRAPARE - Administrator, Civil Service (Medical): No    Lack of Transportation (Non-Medical): No  Physical Activity: Insufficiently Active (07/02/2022)   Exercise Vital Sign    Days of Exercise per Week: 2 days    Minutes of Exercise per Session: 30 min  Stress: No Stress Concern Present (07/02/2022)   [employer] of Occupational Health - Occupational Stress Questionnaire    Feeling of Stress : Not at all  Social Connections: Socially Isolated (07/02/2022)   Social Connection and Isolation Panel [NHANES]    Frequency of Communication with Friends and Family: Once a week    Frequency of Social Gatherings with Friends and Family: Once a week    Attends Religious Services: Never    Database administrator or Organizations: No    Attends Engineer, structural: Not on file    Marital Status: Divorced  Intimate Partner Violence: Unknown (12/25/2021)   Received from [employer], [facility]   HITS    Physically Hurt: Not on file    Insult or Talk Down To: Not on file     Threaten Physical Harm: Not on file    Scream or Curse: Not on file    Past Surgical History:  Procedure Laterality Date   A-FLUTTER ABLATION N/A 03/11/2020   Procedure: A-FLUTTER ABLATION;  Surgeon: Duke Salvia, MD;  Location[facility]VASIVE CV LAB;  Service: Cardiovascular;  Laterality: N/A;   APPLICATION OF WOUND VAC  10/18/2020   Procedure: APPLICATION OF WOUND VAC;  Surgeon: Corliss Skains, MD;  Location: [facility] OR;  Service: Thoracic;;   ASD REPAIR N/A 10/03/2020   Procedure: ATRIAL SEPTAL DEFECT (ASD) REPAIR WITH PERI GUARD PERICARDIUM;  Surgeon: Linden Dolin, MD;  Location: [facility] OR;  Service: Open Heart Surgery;  Laterality: N/A;   CARDIOVERSION N/A 10/21/2022  Procedure: CARDIOVERSION;  Surgeon: Jodelle Red, MD;  Location: [facility] INVASIVE CV LAB;  Service: Cardiovascular;  Laterality: N/A;   CLIPPING OF ATRIAL APPENDAGE Left 10/03/2020   Procedure: CLIPPING OF ATRIAL APPENDAGE WITH ATRICURE 40 CLIP;  Surgeon: Linden Dolin, MD;  Location: [facility] OR;  Service: Open Heart Surgery;  Laterality: Left;   CORONARY ARTERY BYPASS GRAFT N/A 10/03/2020   Procedure: CORONARY ARTERY BYPASS GRAFTING (CABG) X FOUR, USING BILATERAL MAMMARY ARTERIES AND RIGHT ENDOSCOPIC GREATER SAPHENOUS VEIN CONDUITS;  Surgeon: Linden Dolin, MD;  Location: [facility] OR;  Service: Open Heart Surgery;  Laterality: N/A;   HERNIA REPAIR  1967   X 2   IR THORACENTESIS ASP PLEURAL SPACE W/IMG GUIDE  10/28/2020   LAPAROSCOPIC GASTRIC BANDING  03/2009   in [place], [place]   LEFT HEART CATH AND CORONARY ANGIOGRAPHY N/A 10/01/2020   Procedure: LEFT HEART CATH AND CORONARY ANGIOGRAPHY;  Surgeon: Lennette Bihari, MD;  Location: [facility] INVASIVE CV LAB;  Service: Cardiovascular;  Laterality: N/A;   MASS EXCISION  12/03/2011   X 2 (gluteal) X 1 (lower back)   MOHS SURGERY     x 2   STERNAL WOUND DEBRIDEMENT N/A 10/18/2020   Procedure: STERNAL WOUND DEBRIDEMENT;  Surgeon: Corliss Skains, MD;  Location: [facility] OR;  Service:  Thoracic;  Laterality: N/A;   STERNAL WOUND DEBRIDEMENT N/A 10/23/2020   Procedure: WOUND VAC CHANGE with APPLICATION OF MYRIAD MORCELLS;  Surgeon: Linden Dolin, MD;  Location: [facility] OR;  Service: Thoracic;  Laterality: N/A;   TEE WITHOUT CARDIOVERSION N/A 10/03/2020   Procedure: TRANSESOPHAGEAL ECHOCARDIOGRAM (TEE);  Surgeon: Linden Dolin, MD;  [facility];  Service: Open Heart Surgery;  Laterality: N/A;    Family History  Problem Relation Age of Onset   Breast cancer Mother    Other Mother        small cell carcinoma   Hypertension Father    Colon polyps Father    Colon polyps Brother     Allergies  Allergen Reactions   Levaquin [Levofloxacin]     Aortic Aneurysm    Current Outpatient Medications on File Prior to Visit  Medication Sig Dispense Refill   acetaminophen (TYLENOL) 500 MG tablet Take 1,000 mg by mouth every 6 (six) hours as needed for moderate pain.     allopurinol (ZYLOPRIM) 300 MG tablet TAKE 1 TABLET BY MOUTH EVERY DAY 90 tablet 0   amiodarone (PACERONE) 200 MG tablet TAKE 1 TABLET BY MOUTH EVERY DAY 90 tablet 1   atorvastatin (LIPITOR) 80 MG tablet TAKE 1 TABLET BY MOUTH EVERY DAY 90 tablet 3   cetirizine (ZYRTEC) 10 MG tablet Take 10 mg by mouth at bedtime.     colchicine 0.6 MG tablet Take 0.6 mg by mouth daily as needed (Flair up gout).      cyclobenzaprine (FLEXERIL) 10 MG tablet TAKE 1 TABLET BY MOUTH EVERYDAY AT BEDTIME 30 tablet 3   FARXIGA 10 MG TABS tablet TAKE 1 TABLET BY MOUTH EVERY DAY BEFORE BREAKFAST 90 tablet 3   fluticasone (FLONASE) 50 MCG/ACT nasal spray SPRAY 2 SPRAYS INTO EACH NOSTRIL EVERY DAY 48 mL 2   metoprolol succinate (TOPROL-XL) 25 MG 24 hr tablet TAKE 1 TABLET BY MOUTH EVERY DAY WITH OR IMMEDIATELY FOLLOWING A MEAL 90 tablet 1   potassium chloride SA (KLOR-CON M20) 20 MEQ tablet TAKE 1 TABLET BY MOUTH EVERY DAY 90 tablet 2   Simethicone (ALKA-SELTZER ANTI-GAS PO) Take 1 tablet by mouth daily as needed (  gas).     torsemide  (DEMADEX) 20 MG tablet TAKE 2 TABLETS (40 MG TOTAL) BY MOUTH DAILY. 180 tablet 1   valACYclovir (VALTREX) 500 MG tablet Take 1,000 mg by mouth daily.     XARELTO 20 MG TABS tablet TAKE 1 TABLET BY MOUTH DAILY WITH SUPPER. 90 tablet 2   No current facility-administered medications on file prior to visit.    BP 110/70   Pulse 81   Temp 97.8 F (36.6 C) (Oral)   Ht 5\' 11"  (1.803 m)   Wt (!) 340 lb (154.2 kg)   SpO2 96%   BMI 47.42 kg/m       Objective:   Physical Exam Vitals and nursing note reviewed.  Constitutional:      General: He is not in acute distress.    Appearance: Normal appearance. He is obese. He is not ill-appearing.  HENT:     Head: Normocephalic and atraumatic.     Right Ear: Tympanic membrane, ear canal and external ear normal. There is no impacted cerumen.     Left Ear: Tympanic membrane, ear canal and external ear normal. There is no impacted cerumen.     Nose: Nose normal. No congestion or rhinorrhea.     Mouth/Throat:     Mouth: Mucous membranes are moist.     Pharynx: Oropharynx is clear.  Eyes:     Extraocular Movements: Extraocular movements intact.     Conjunctiva/sclera: Conjunctivae normal.     Pupils: Pupils are equal, round, and reactive to light.  Neck:     Vascular: No carotid bruit.  Cardiovascular:     Rate and Rhythm: Normal rate and regular rhythm.     Pulses: Normal pulses.     Heart sounds: Normal heart sounds. No murmur heard.    No friction rub. No gallop.  Pulmonary:     Effort: Pulmonary effort is normal.     Breath sounds: Normal breath sounds.  Abdominal:     General: Abdomen is flat. Bowel sounds are normal. There is no distension.     Palpations: Abdomen is soft. There is no mass.     Tenderness: There is no abdominal tenderness. There is no guarding or rebound.     Hernia: No hernia is present.  Musculoskeletal:        General: Normal range of motion.     Cervical back: Normal range of motion and neck supple.   Lymphadenopathy:     Cervical: No cervical adenopathy.  Skin:    General: Skin is warm and dry.     Capillary Refill: Capillary refill takes less than 2 seconds.     Comments: Hyperpigmentation in lower extremities from venous insufficiency. No skin break down   Neurological:     General: No focal deficit present.     Mental Status: He is alert and oriented to person, place, and time.  Psychiatric:        Mood and Affect: Mood normal.        Behavior: Behavior normal.        Thought Content: Thought content normal.        Judgment: Judgment normal.       Assessment & Plan:  1. Routine general medical examination at a health care facility Today patient counseled on age appropriate routine health concerns for screening and prevention, each reviewed and up to date or declined. Immunizations reviewed and up to date or declined. Labs ordered and reviewed. Risk factors for depression reviewed and  negative. Hearing function and visual acuity are intact. ADLs screened and addressed as needed. Functional ability and level of safety reviewed and appropriate. Education, counseling and referrals performed based on assessed risks today. Patient provided with a copy of personalized plan for preventive services. - Follow up in one year or sooner if needed - Eat healthy and exercise   2. Essential hypertension - Well controlled. No change in medication  - Lipid panel; Future - TSH; Future - CBC; Future - Comprehensive metabolic panel; Future - PSA; Future  3. History of DVT (deep vein thrombosis) - Continue Eliquis  - Lipid panel; Future - TSH; Future - CBC; Future - Comprehensive metabolic panel; Future - PSA; Future  4. Chronic gout involving toe without tophus, unspecified cause, unspecified laterality - Continue allopurinol  - Lipid panel; Future - TSH; Future - CBC; Future - Comprehensive metabolic panel; Future - PSA; Future  5. Typical atrial flutter (HCC) - Per cardiology   - Lipid panel; Future - TSH; Future - CBC; Future - Comprehensive metabolic panel; Future - PSA; Future  6. FH: bariatric surgery  - Lipid panel; Future - TSH; Future - CBC; Future - Comprehensive metabolic panel; Future - PSA; Future  7. OSA (obstructive sleep apnea) - Continue with CPAP  - Lipid panel; Future - TSH; Future - CBC; Future - Comprehensive metabolic panel; Future - PSA; Future  8. Mixed hyperlipidemia - Continue with statin  - Lipid panel; Future - TSH; Future - CBC; Future - Comprehensive metabolic panel; Future - PSA; Future  9. Class 2 obesity - he would like to try using Wegovy.  - Advised following up one month after starting [facility]  - Lipid panel; Future - TSH; Future - CBC; Future - Comprehensive metabolic panel; Future - PSA; Future - Hemoglobin A1c; Future - Semaglutide-Weight Management (WEGOVY) 0.25 MG/0.5ML SOAJ; Inject 0.25 mg into the skin once a week.  Dispense: 2 mL; Refill: 0  10. Colon cancer screening - Encouraged to call and schedule colonoscopy   11. History of non-ST elevation myocardial infarction (NSTEMI) - Per Cardiology.  - Will see if we can get him approved for [facility]  - Semaglutide-Weight Management (WEGOVY) 0.25 MG/0.5ML SOAJ; Inject 0.25 mg into the skin once a week.  Dispense: 2 mL; Refill: 0   Shirline Frees, NP

## 2023-01-15 ENCOUNTER — Encounter: Payer: Self-pay | Admitting: Internal Medicine

## 2023-01-21 ENCOUNTER — Ambulatory Visit: Payer: BC Managed Care – PPO | Admitting: Student

## 2023-01-26 ENCOUNTER — Encounter: Payer: Self-pay | Admitting: Adult Health

## 2023-01-27 ENCOUNTER — Other Ambulatory Visit: Payer: Self-pay | Admitting: Adult Health

## 2023-01-27 DIAGNOSIS — M545 Low back pain, unspecified: Secondary | ICD-10-CM

## 2023-01-27 NOTE — Telephone Encounter (Signed)
Can someone help with pt Wegovy?

## 2023-01-29 NOTE — Progress Notes (Signed)
Electrophysiology Office Note:   Date:  02/01/2023  ID:  Matthew Savage, DOB 1961/03/03, MRN [id number]  Primary Cardiologist: Christell Constant, MD Electrophysiologist: Sherryl Manges, MD      History of Present Illness:   Matthew Savage is a 62 y.o. male with h/o AF, AFL, HTN, mild AS, HLD, tobacco abuse, and morbid obesity seen today for routine electrophysiology followup.   Seen by Dr. Lalla Brothers 09/2022 and not felt to be candidate for redo ablation with body habitus. Underwent [facility] 10/21/2022  Since last being seen in our clinic the patient reports doing OK. 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  Denies any relation of balance to amiodarone. Has been prescribed Wegovy for weight loss, but waiting insurance auth. Has not started yet. he denies chest pain, palpitations, dyspnea, PND, orthopnea, nausea, vomiting, dizziness, syncope, edema, weight gain, or early satiety.   Review of systems complete and found to be negative unless listed in HPI.   EP Information / Studies Reviewed:    EKG is ordered today. Personal review as below.  EKG Interpretation Date/Time:  Monday February 01 2023 07:55:51 EDT Ventricular Rate:  76 PR Interval:  206 QRS Duration:  100 QT Interval:  444 QTC Calculation: 499 R Axis:   101  Text Interpretation: Normal sinus rhythm Rightward axis Borderline Prolonged QT Confirmed by Maxine Glenn [phone]) on 02/01/2023 8:11:41 AM    Arrhythmia history AFL ablation 02/2020  Physical Exam:   VS:  BP 118/72   Pulse 76   Ht 6' (1.829 m)   Wt (!) 342 lb (155.1 kg)   BMI 46.38 kg/m    Wt Readings from Last 3 Encounters:  02/01/23 (!) 342 lb (155.1 kg)  01/14/23 (!) 340 lb (154.2 kg)  10/21/22 (!) 343 lb (155.6 kg)     GEN: Well nourished, well developed in no acute distress NECK: No JVD; No carotid bruits CARDIAC: Regular rate and rhythm, no murmurs, rubs, gallops RESPIRATORY:  Clear to auscultation without  rales, wheezing or rhonchi  ABDOMEN: Soft, non-tender, non-distended EXTREMITIES:  No edema; No deformity   ASSESSMENT AND PLAN:    Persistent atrial fibrillation Atrial flutter, typical Ablation 2021 Seen by Dr. Lalla Brothers in June and not felt to be a candidate for redo given his body habitus.   Obesity Body mass index is 46.38 kg/m.  Encouraged lifestyle modification  Prescribed Wegovy but awaiting insurance auth to start taking  CAD s/p CABG Denies s/s ischemia  Mild AS No signs of valve dysfunction today on exam.    Follow up with Dr. Graciela Husbands in 6 months  Signed, Graciella Freer, PA-C

## 2023-02-01 ENCOUNTER — Telehealth: Payer: Self-pay

## 2023-02-01 ENCOUNTER — Ambulatory Visit: Payer: BC Managed Care – PPO | Attending: Student | Admitting: Student

## 2023-02-01 ENCOUNTER — Encounter: Payer: Self-pay | Admitting: Student

## 2023-02-01 ENCOUNTER — Other Ambulatory Visit (HOSPITAL_COMMUNITY): Payer: Self-pay

## 2023-02-01 VITALS — BP 118/72 | HR 76 | Ht 72.0 in | Wt 342.0 lb

## 2023-02-01 DIAGNOSIS — I5020 Unspecified systolic (congestive) heart failure: Secondary | ICD-10-CM

## 2023-02-01 DIAGNOSIS — I483 Typical atrial flutter: Secondary | ICD-10-CM

## 2023-02-01 DIAGNOSIS — I350 Nonrheumatic aortic (valve) stenosis: Secondary | ICD-10-CM

## 2023-02-01 DIAGNOSIS — Z951 Presence of aortocoronary bypass graft: Secondary | ICD-10-CM

## 2023-02-01 NOTE — Telephone Encounter (Signed)
Pharmacy Patient Advocate Encounter   Received notification from Patient Advice Request messages that prior authorization for [facility] 0.25mg  is required/requested.   Insurance verification completed.   The patient is insured through [facility] .   Per test claim: PA required; PA submitted to [employer] via CoverMyMeds Key/confirmation #/EOC [id number] Status is pending

## 2023-02-01 NOTE — Patient Instructions (Signed)
Medication Instructions:  Your physician recommends that you continue on your current medications as directed. Please refer to the Current Medication list given to you today.  *If you need a refill on your cardiac medications before your next appointment, please call your pharmacy*  Lab Work: None ordered If you have labs (blood work) drawn today and your tests are completely normal, you will receive your results only by: MyChart Message (if you have MyChart) OR A paper copy in the mail If you have any lab test that is abnormal or we need to change your treatment, we will call you to review the results.  Follow-Up: At [facility], you and your health needs are our priority.  As part of our continuing mission to provide you with exceptional heart care, we have created designated Provider Care Teams.  These Care Teams include your primary Cardiologist (physician) and Advanced Practice Providers (APPs -  Physician Assistants and Nurse Practitioners) who all work together to provide you with the care you need, when you need it.  Your next appointment:   6 month(s)  Provider:   Sherryl Manges, MD

## 2023-02-01 NOTE — Telephone Encounter (Signed)
Pharmacy Patient Advocate Encounter  Received notification from [facility] that Prior Authorization for Wegovy 0.25MG /0.5ML auto-injectors has been APPROVED from 02/01/23 to 08/30/23. Ran test claim, Copay is $0. This test claim was processed through [facility]- copay amounts may vary at other pharmacies due to pharmacy/plan contracts, or as the patient moves through the different stages of their insurance plan.   PA #/Case ID/Reference #: [id number]

## 2023-02-02 NOTE — Telephone Encounter (Signed)
Noted  

## 2023-02-04 NOTE — Telephone Encounter (Signed)
Patient notified of update  and verbalized understanding.

## 2023-02-13 ENCOUNTER — Other Ambulatory Visit: Payer: Self-pay | Admitting: Adult Health

## 2023-02-13 DIAGNOSIS — M109 Gout, unspecified: Secondary | ICD-10-CM

## 2023-02-28 ENCOUNTER — Encounter: Payer: Self-pay | Admitting: Internal Medicine

## 2023-02-28 ENCOUNTER — Encounter: Payer: Self-pay | Admitting: Adult Health

## 2023-03-02 NOTE — Telephone Encounter (Signed)
Please advise 

## 2023-03-14 ENCOUNTER — Other Ambulatory Visit: Payer: Self-pay | Admitting: Adult Health

## 2023-03-14 ENCOUNTER — Other Ambulatory Visit: Payer: Self-pay | Admitting: Internal Medicine

## 2023-03-14 DIAGNOSIS — I825Z2 Chronic embolism and thrombosis of unspecified deep veins of left distal lower extremity: Secondary | ICD-10-CM

## 2023-04-08 ENCOUNTER — Other Ambulatory Visit: Payer: Self-pay | Admitting: Internal Medicine

## 2023-05-13 ENCOUNTER — Ambulatory Visit: Payer: BC Managed Care – PPO | Admitting: Internal Medicine

## 2023-05-20 ENCOUNTER — Ambulatory Visit: Payer: BC Managed Care – PPO | Admitting: Internal Medicine

## 2023-05-30 ENCOUNTER — Other Ambulatory Visit: Payer: Self-pay | Admitting: Adult Health

## 2023-05-30 DIAGNOSIS — M545 Low back pain, unspecified: Secondary | ICD-10-CM

## 2023-07-04 ENCOUNTER — Other Ambulatory Visit: Payer: Self-pay | Admitting: Internal Medicine

## 2023-07-04 DIAGNOSIS — I10 Essential (primary) hypertension: Secondary | ICD-10-CM

## 2023-07-04 DIAGNOSIS — I4891 Unspecified atrial fibrillation: Secondary | ICD-10-CM

## 2023-07-19 ENCOUNTER — Other Ambulatory Visit: Payer: Self-pay | Admitting: Adult Health

## 2023-07-19 ENCOUNTER — Other Ambulatory Visit: Payer: Self-pay | Admitting: Internal Medicine

## 2023-07-19 DIAGNOSIS — M109 Gout, unspecified: Secondary | ICD-10-CM

## 2023-07-28 ENCOUNTER — Other Ambulatory Visit: Payer: Self-pay | Admitting: Internal Medicine

## 2023-08-12 ENCOUNTER — Other Ambulatory Visit: Payer: Self-pay | Admitting: Internal Medicine

## 2023-08-12 DIAGNOSIS — I4891 Unspecified atrial fibrillation: Secondary | ICD-10-CM

## 2023-08-12 DIAGNOSIS — I10 Essential (primary) hypertension: Secondary | ICD-10-CM

## 2023-08-13 ENCOUNTER — Encounter: Payer: Self-pay | Admitting: Internal Medicine

## 2023-08-13 ENCOUNTER — Other Ambulatory Visit: Payer: Self-pay | Admitting: Internal Medicine

## 2023-08-13 DIAGNOSIS — I4891 Unspecified atrial fibrillation: Secondary | ICD-10-CM

## 2023-08-13 DIAGNOSIS — I10 Essential (primary) hypertension: Secondary | ICD-10-CM

## 2023-08-14 ENCOUNTER — Other Ambulatory Visit: Payer: Self-pay | Admitting: Internal Medicine

## 2023-08-14 DIAGNOSIS — I10 Essential (primary) hypertension: Secondary | ICD-10-CM

## 2023-08-14 DIAGNOSIS — I4891 Unspecified atrial fibrillation: Secondary | ICD-10-CM

## 2023-08-16 MED ORDER — AMIODARONE HCL 200 MG PO TABS: 200.0000 mg | ORAL_TABLET | Freq: Every day | ORAL | 1 refills | Status: AC

## 2023-08-16 MED ORDER — METOPROLOL SUCCINATE ER 25 MG PO TB24: 25.0000 mg | ORAL_TABLET | Freq: Every day | ORAL | 1 refills | Status: AC

## 2023-08-16 NOTE — Addendum Note (Signed)
 Addended by: CHAUVIGNE, Shanie Mauzy on: 08/16/2023 11:10 AM   Modules accepted: Orders

## 2023-08-18 ENCOUNTER — Other Ambulatory Visit: Payer: Self-pay | Admitting: Adult Health

## 2023-08-18 ENCOUNTER — Other Ambulatory Visit: Payer: Self-pay | Admitting: Internal Medicine

## 2023-08-18 DIAGNOSIS — I825Z2 Chronic embolism and thrombosis of unspecified deep veins of left distal lower extremity: Secondary | ICD-10-CM

## 2023-08-18 DIAGNOSIS — M109 Gout, unspecified: Secondary | ICD-10-CM

## 2023-08-24 ENCOUNTER — Other Ambulatory Visit: Payer: Self-pay | Admitting: Internal Medicine

## 2023-08-28 MED ORDER — AMIODARONE HCL 200 MG PO TABS
200.0000 mg | ORAL_TABLET | Freq: Every day | ORAL | 0 refills | Status: AC
  Filled 2023-08-28: qty 30, 30d supply, fill #0

## 2023-08-28 MED ORDER — METOPROLOL SUCCINATE ER 25 MG PO TB24
25.0000 mg | ORAL_TABLET | Freq: Every day | ORAL | 0 refills | Status: AC
  Filled 2023-08-28: qty 30, 30d supply, fill #0

## 2023-08-28 NOTE — Addendum Note (Signed)
 Addended by: Gloriann Larger A on: 08/28/2023 02:07 PM   Modules accepted: Orders

## 2023-08-29 ENCOUNTER — Other Ambulatory Visit (HOSPITAL_COMMUNITY): Payer: Self-pay

## 2023-08-30 ENCOUNTER — Other Ambulatory Visit (HOSPITAL_COMMUNITY): Payer: Self-pay

## 2023-09-26 ENCOUNTER — Other Ambulatory Visit: Payer: Self-pay | Admitting: Adult Health

## 2023-09-26 DIAGNOSIS — M545 Low back pain, unspecified: Secondary | ICD-10-CM

## 2023-11-08 ENCOUNTER — Other Ambulatory Visit: Payer: Self-pay | Admitting: *Deleted

## 2023-11-26 ENCOUNTER — Other Ambulatory Visit (HOSPITAL_COMMUNITY): Payer: Self-pay

## 2023-11-26 ENCOUNTER — Other Ambulatory Visit: Payer: Self-pay

## 2023-11-26 MED ORDER — TORSEMIDE 20 MG PO TABS
40.0000 mg | ORAL_TABLET | Freq: Every day | ORAL | 0 refills | Status: AC
  Filled 2023-11-26: qty 60, 30d supply, fill #0

## 2023-12-07 ENCOUNTER — Other Ambulatory Visit (HOSPITAL_COMMUNITY): Payer: Self-pay

## 2023-12-21 ENCOUNTER — Other Ambulatory Visit: Payer: Self-pay

## 2023-12-21 NOTE — Telephone Encounter (Signed)
 Pt's pharmacy is requesting a refill on medication atorvastatin . Pt is overdue for an appt with Dr.  Santo and has had numerous attempt to make an appt and pt has not done so. Would Dr. Santo like to refill medication without pt being seen? Please address

## 2024-01-28 ENCOUNTER — Other Ambulatory Visit: Payer: Self-pay | Admitting: Adult Health

## 2024-01-28 DIAGNOSIS — M545 Low back pain, unspecified: Secondary | ICD-10-CM

## 2024-04-17 ENCOUNTER — Other Ambulatory Visit: Payer: Self-pay | Admitting: Adult Health

## 2024-04-17 DIAGNOSIS — M109 Gout, unspecified: Secondary | ICD-10-CM
